# Patient Record
Sex: Female | Born: 1955 | Race: Black or African American | Hispanic: No | Smoking: Current every day smoker
Health system: Southern US, Community
[De-identification: ages and names within clinical notes are randomized; demographics above are authoritative.]

## PROBLEM LIST (undated history)

## (undated) DIAGNOSIS — I10 Essential (primary) hypertension: Secondary | ICD-10-CM

## (undated) DIAGNOSIS — M2550 Pain in unspecified joint: Secondary | ICD-10-CM

## (undated) DIAGNOSIS — R011 Cardiac murmur, unspecified: Secondary | ICD-10-CM

## (undated) DIAGNOSIS — E785 Hyperlipidemia, unspecified: Secondary | ICD-10-CM

## (undated) DIAGNOSIS — R51 Headache: Secondary | ICD-10-CM

## (undated) DIAGNOSIS — R531 Weakness: Secondary | ICD-10-CM

## (undated) DIAGNOSIS — M1711 Unilateral primary osteoarthritis, right knee: Secondary | ICD-10-CM

## (undated) DIAGNOSIS — M549 Dorsalgia, unspecified: Secondary | ICD-10-CM

## (undated) DIAGNOSIS — L678 Other hair color and hair shaft abnormalities: Secondary | ICD-10-CM

## (undated) HISTORY — PX: ABDOMINAL HYSTERECTOMY: SHX81

## (undated) HISTORY — PX: CHOLECYSTECTOMY: SHX55

## (undated) HISTORY — DX: Unilateral primary osteoarthritis, right knee: M17.11

## (undated) HISTORY — PX: DILATION AND CURETTAGE OF UTERUS: SHX78

## (undated) HISTORY — PX: EYE SURGERY: SHX253

## (undated) HISTORY — PX: OTHER SURGICAL HISTORY: SHX169

## (undated) HISTORY — DX: Essential (primary) hypertension: I10

## (undated) HISTORY — DX: Hyperlipidemia, unspecified: E78.5

---

## 1997-09-16 ENCOUNTER — Emergency Department (HOSPITAL_COMMUNITY): Admission: EM | Admit: 1997-09-16 | Discharge: 1997-09-16 | Payer: Self-pay | Admitting: Emergency Medicine

## 1998-04-18 ENCOUNTER — Emergency Department (HOSPITAL_COMMUNITY): Admission: EM | Admit: 1998-04-18 | Discharge: 1998-04-18 | Payer: Self-pay | Admitting: *Deleted

## 1998-04-18 ENCOUNTER — Encounter: Payer: Self-pay | Admitting: Emergency Medicine

## 1998-06-23 ENCOUNTER — Encounter: Admission: RE | Admit: 1998-06-23 | Discharge: 1998-09-21 | Payer: Self-pay | Admitting: Orthopedic Surgery

## 1998-07-02 ENCOUNTER — Encounter (HOSPITAL_BASED_OUTPATIENT_CLINIC_OR_DEPARTMENT_OTHER): Payer: Self-pay | Admitting: General Surgery

## 1998-07-02 ENCOUNTER — Ambulatory Visit (HOSPITAL_COMMUNITY): Admission: RE | Admit: 1998-07-02 | Discharge: 1998-07-03 | Payer: Self-pay | Admitting: General Surgery

## 1999-11-03 ENCOUNTER — Encounter: Payer: Self-pay | Admitting: Family Medicine

## 1999-11-03 ENCOUNTER — Encounter: Admission: RE | Admit: 1999-11-03 | Discharge: 1999-11-03 | Payer: Self-pay | Admitting: Family Medicine

## 2000-09-26 ENCOUNTER — Encounter: Payer: Self-pay | Admitting: Family Medicine

## 2000-09-26 ENCOUNTER — Encounter: Admission: RE | Admit: 2000-09-26 | Discharge: 2000-09-26 | Payer: Self-pay | Admitting: Family Medicine

## 2000-12-11 ENCOUNTER — Other Ambulatory Visit: Admission: RE | Admit: 2000-12-11 | Discharge: 2000-12-11 | Payer: Self-pay | Admitting: Obstetrics

## 2000-12-11 ENCOUNTER — Encounter: Admission: RE | Admit: 2000-12-11 | Discharge: 2000-12-11 | Payer: Self-pay | Admitting: Obstetrics & Gynecology

## 2000-12-18 ENCOUNTER — Encounter: Admission: RE | Admit: 2000-12-18 | Discharge: 2000-12-18 | Payer: Self-pay | Admitting: Obstetrics & Gynecology

## 2000-12-25 ENCOUNTER — Ambulatory Visit (HOSPITAL_COMMUNITY): Admission: RE | Admit: 2000-12-25 | Discharge: 2000-12-25 | Payer: Self-pay | Admitting: *Deleted

## 2001-01-03 ENCOUNTER — Encounter: Admission: RE | Admit: 2001-01-03 | Discharge: 2001-01-03 | Payer: Self-pay | Admitting: Obstetrics

## 2001-01-24 ENCOUNTER — Encounter: Admission: RE | Admit: 2001-01-24 | Discharge: 2001-01-24 | Payer: Self-pay | Admitting: Obstetrics

## 2001-01-26 ENCOUNTER — Inpatient Hospital Stay (HOSPITAL_COMMUNITY): Admission: AD | Admit: 2001-01-26 | Discharge: 2001-01-26 | Payer: Self-pay | Admitting: Obstetrics & Gynecology

## 2001-01-31 ENCOUNTER — Encounter: Admission: RE | Admit: 2001-01-31 | Discharge: 2001-01-31 | Payer: Self-pay | Admitting: Family Medicine

## 2001-01-31 ENCOUNTER — Encounter: Payer: Self-pay | Admitting: Family Medicine

## 2001-02-21 ENCOUNTER — Inpatient Hospital Stay (HOSPITAL_COMMUNITY): Admission: RE | Admit: 2001-02-21 | Discharge: 2001-02-24 | Payer: Self-pay | Admitting: Obstetrics

## 2001-02-21 ENCOUNTER — Encounter (INDEPENDENT_AMBULATORY_CARE_PROVIDER_SITE_OTHER): Payer: Self-pay | Admitting: Specialist

## 2001-06-12 ENCOUNTER — Encounter: Admission: RE | Admit: 2001-06-12 | Discharge: 2001-06-12 | Payer: Self-pay | Admitting: Family Medicine

## 2001-06-12 ENCOUNTER — Encounter: Payer: Self-pay | Admitting: Family Medicine

## 2001-06-14 ENCOUNTER — Encounter: Payer: Self-pay | Admitting: Family Medicine

## 2001-06-14 ENCOUNTER — Encounter: Admission: RE | Admit: 2001-06-14 | Discharge: 2001-06-14 | Payer: Self-pay | Admitting: Family Medicine

## 2001-12-30 ENCOUNTER — Encounter: Admission: RE | Admit: 2001-12-30 | Discharge: 2001-12-30 | Payer: Self-pay | Admitting: Family Medicine

## 2001-12-30 ENCOUNTER — Encounter: Payer: Self-pay | Admitting: Family Medicine

## 2002-07-01 ENCOUNTER — Emergency Department (HOSPITAL_COMMUNITY): Admission: EM | Admit: 2002-07-01 | Discharge: 2002-07-01 | Payer: Self-pay | Admitting: Emergency Medicine

## 2003-03-06 ENCOUNTER — Emergency Department (HOSPITAL_COMMUNITY): Admission: EM | Admit: 2003-03-06 | Discharge: 2003-03-07 | Payer: Self-pay | Admitting: Emergency Medicine

## 2003-05-07 ENCOUNTER — Emergency Department (HOSPITAL_COMMUNITY): Admission: EM | Admit: 2003-05-07 | Discharge: 2003-05-07 | Payer: Self-pay | Admitting: Family Medicine

## 2003-05-26 ENCOUNTER — Emergency Department (HOSPITAL_COMMUNITY): Admission: EM | Admit: 2003-05-26 | Discharge: 2003-05-26 | Payer: Self-pay | Admitting: Family Medicine

## 2003-06-25 ENCOUNTER — Emergency Department (HOSPITAL_COMMUNITY): Admission: EM | Admit: 2003-06-25 | Discharge: 2003-06-25 | Payer: Self-pay | Admitting: Family Medicine

## 2003-10-28 ENCOUNTER — Emergency Department (HOSPITAL_COMMUNITY): Admission: EM | Admit: 2003-10-28 | Discharge: 2003-10-28 | Payer: Self-pay | Admitting: Family Medicine

## 2003-10-30 ENCOUNTER — Emergency Department (HOSPITAL_COMMUNITY): Admission: EM | Admit: 2003-10-30 | Discharge: 2003-10-30 | Payer: Self-pay | Admitting: Family Medicine

## 2003-11-30 ENCOUNTER — Emergency Department (HOSPITAL_COMMUNITY): Admission: EM | Admit: 2003-11-30 | Discharge: 2003-11-30 | Payer: Self-pay | Admitting: Family Medicine

## 2003-12-03 ENCOUNTER — Emergency Department (HOSPITAL_COMMUNITY): Admission: EM | Admit: 2003-12-03 | Discharge: 2003-12-03 | Payer: Self-pay | Admitting: Family Medicine

## 2004-06-06 ENCOUNTER — Emergency Department (HOSPITAL_COMMUNITY): Admission: EM | Admit: 2004-06-06 | Discharge: 2004-06-06 | Payer: Self-pay | Admitting: Family Medicine

## 2004-07-13 ENCOUNTER — Emergency Department (HOSPITAL_COMMUNITY): Admission: EM | Admit: 2004-07-13 | Discharge: 2004-07-13 | Payer: Self-pay | Admitting: Family Medicine

## 2004-07-15 ENCOUNTER — Emergency Department (HOSPITAL_COMMUNITY): Admission: EM | Admit: 2004-07-15 | Discharge: 2004-07-15 | Payer: Self-pay | Admitting: Emergency Medicine

## 2005-01-29 ENCOUNTER — Emergency Department (HOSPITAL_COMMUNITY): Admission: EM | Admit: 2005-01-29 | Discharge: 2005-01-29 | Payer: Self-pay | Admitting: Family Medicine

## 2005-02-28 ENCOUNTER — Encounter: Admission: RE | Admit: 2005-02-28 | Discharge: 2005-02-28 | Payer: Self-pay | Admitting: Family Medicine

## 2005-06-06 ENCOUNTER — Emergency Department (HOSPITAL_COMMUNITY): Admission: EM | Admit: 2005-06-06 | Discharge: 2005-06-06 | Payer: Self-pay | Admitting: Family Medicine

## 2005-10-03 ENCOUNTER — Emergency Department (HOSPITAL_COMMUNITY): Admission: EM | Admit: 2005-10-03 | Discharge: 2005-10-03 | Payer: Self-pay | Admitting: Emergency Medicine

## 2005-10-05 ENCOUNTER — Emergency Department (HOSPITAL_COMMUNITY): Admission: EM | Admit: 2005-10-05 | Discharge: 2005-10-05 | Payer: Self-pay | Admitting: Emergency Medicine

## 2006-11-26 ENCOUNTER — Emergency Department (HOSPITAL_COMMUNITY): Admission: EM | Admit: 2006-11-26 | Discharge: 2006-11-26 | Payer: Self-pay | Admitting: Emergency Medicine

## 2007-01-31 ENCOUNTER — Emergency Department (HOSPITAL_COMMUNITY): Admission: EM | Admit: 2007-01-31 | Discharge: 2007-01-31 | Payer: Self-pay | Admitting: Emergency Medicine

## 2007-10-03 ENCOUNTER — Emergency Department (HOSPITAL_COMMUNITY): Admission: EM | Admit: 2007-10-03 | Discharge: 2007-10-03 | Payer: Self-pay | Admitting: Family Medicine

## 2007-10-30 ENCOUNTER — Emergency Department (HOSPITAL_COMMUNITY): Admission: EM | Admit: 2007-10-30 | Discharge: 2007-10-30 | Payer: Self-pay | Admitting: Family Medicine

## 2007-11-28 ENCOUNTER — Emergency Department (HOSPITAL_COMMUNITY): Admission: EM | Admit: 2007-11-28 | Discharge: 2007-11-28 | Payer: Self-pay | Admitting: Family Medicine

## 2008-07-10 ENCOUNTER — Emergency Department (HOSPITAL_COMMUNITY): Admission: EM | Admit: 2008-07-10 | Discharge: 2008-07-10 | Payer: Self-pay | Admitting: Emergency Medicine

## 2008-09-11 ENCOUNTER — Emergency Department (HOSPITAL_COMMUNITY): Admission: EM | Admit: 2008-09-11 | Discharge: 2008-09-11 | Payer: Self-pay | Admitting: Family Medicine

## 2009-07-27 ENCOUNTER — Emergency Department (HOSPITAL_COMMUNITY): Admission: EM | Admit: 2009-07-27 | Discharge: 2009-07-27 | Payer: Self-pay | Admitting: Emergency Medicine

## 2009-07-29 ENCOUNTER — Emergency Department (HOSPITAL_COMMUNITY): Admission: EM | Admit: 2009-07-29 | Discharge: 2009-07-29 | Payer: Self-pay | Admitting: Family Medicine

## 2009-08-18 ENCOUNTER — Encounter: Admission: RE | Admit: 2009-08-18 | Discharge: 2009-08-18 | Payer: Self-pay | Admitting: Internal Medicine

## 2010-04-03 LAB — CULTURE, ROUTINE-ABSCESS

## 2010-04-25 LAB — URINALYSIS, ROUTINE W REFLEX MICROSCOPIC
Bilirubin Urine: NEGATIVE
Ketones, ur: NEGATIVE mg/dL
Nitrite: NEGATIVE
Protein, ur: NEGATIVE mg/dL
Urobilinogen, UA: 0.2 mg/dL (ref 0.0–1.0)

## 2010-04-25 LAB — POCT I-STAT, CHEM 8
BUN: 19 mg/dL (ref 6–23)
Creatinine, Ser: 0.8 mg/dL (ref 0.4–1.2)
Potassium: 3.7 mEq/L (ref 3.5–5.1)
Sodium: 139 mEq/L (ref 135–145)

## 2010-04-25 LAB — D-DIMER, QUANTITATIVE: D-Dimer, Quant: 0.33 ug/mL-FEU (ref 0.00–0.48)

## 2010-06-03 NOTE — Op Note (Signed)
[facility]  Patient:    Alison Ford, Alison Ford Visit Number: [id number] MRN: [id number]          Service Type: Attending:  Kathreen Cosier, M.D. Dictated by:   Kathreen Cosier, M.D. Proc. Date: 02/21/01                             Operative Report  PREOPERATIVE DIAGNOSIS:       Twenty-week myoma uteri.  POSTOPERATIVE DIAGNOSIS:      Twenty-week myoma uteri.  OPERATION:                    1. Total abdominal hysterectomy.                               2. Bilateral salpingo-oophorectomy.  SURGEON:                      Kathreen Cosier, M.D.  ASSISTANT:                    Bing Neighbors. Clearance Coots, M.D.  ANESTHESIA:                   General.  DESCRIPTION OF PROCEDURE:     Under general anesthesia with the patient in supine position, the abdomen was prepped and draped and the bladder emptied with a Foley catheter. A transverse suprapubic incision was made and carried down to the rectus fascia. The fascia was cleanly incised the length of the incision. The recti muscles were retracted laterally. The peritoneum was incised longitudinally. She had multiple fibroids approximately 20-week size uterus. The right round ligament was grasped with a Kelly clamp, cut and suture ligated with #1 chromic. The procedure done in a similar fashion on the other side. Using Metzenbaum scissors, the bladder flap was developed and the bladder dissected off the surface. The right infundibulopelvic ligament was grasped, cut, and suture ligated with #1 chromic. Procedure done in a similar fashion on the other side. The uterine vessels were skeletonized bilaterally, double clamped with Heaney clamps on the right, cut, suture ligated with #1 chromic. Procedure was done in a similar fashion on the other side. On the right side, straight Kocher clamps were used to grasp the cardinal, uterosacral ligaments. These were cut, suture ligated with #1 chromic. Procedure was done in a  similar fashion on the other side. The specimen consisted of the uterus, tubes, and ovaries were removed at cervicovaginal junction with Mayo scissors. Modified Richardson sutures placed in the angle of the vagina and the vaginal vault run with interlocking sutures of #1 chromic. The vault was left open. The operative site was closed with 2-0 chromic. Blood loss was 100 cc. Lap and sponge counts were correct. The abdomen was closed in layers: the peritoneum with continuous of 0 chromic, the fascia with continuous suture of 0 Dexon, and the skin closed with subcuticular stitch of 3-0 plain. Dictated by:   Kathreen Cosier, M.D. Attending:  Kathreen Cosier, M.D. DD:  02/24/01 TD:  02/25/01 Job: [id number]

## 2010-06-03 NOTE — Discharge Summary (Signed)
[facility]  Patient:    Alison Ford, Alison Ford Visit Number: [id number] MRN: [id number]          Service Type: GYN Location: [id number] Attending Physician:  Venita Sheffield Dictated by:   Kathreen Cosier, M.D. Admit Date:  02/21/2001 Discharge Date: 02/24/2001                             Discharge Summary  HISTORY OF PRESENT ILLNESS:   The patient is a 55 year old female with a 20-week size myoma and a history of dysfunctional uterine bleeding and chronic pelvic pain who was admitted for TAH/BSO.  HOSPITAL COURSE:              On admission her hemoglobin was 10.7, white count was 5.9, platelets 366,000, PT/PTT normal, normal electrolytes, normal CBC. On February 6, the patient underwent TAH/BSO. Postoperatively, she did well. She had one temperature elevation to 101 and was given Cleocin 300 mg p.o. q.6h. She rapidly defervesced and was discharged home on the third postoperative day, ambulatory, on a regular diet.  DISCHARGE MEDICATIONS:        1. Tylox one every three to four hours p.r.n.                               2. Cleocin 300 mg p.o. q.6h. for five days.                               3. Ferrous sulfate 325 mg one p.o. q.d.  DISCHARGE FOLLOWUP:           The patient is to see Dr. Gaynell Face in four weeks.  DISCHARGE DIAGNOSES:          1. Status post total abdominal hysterectomy and                                  bilateral salpingo-oophorectomy for 20-week                                  size myoma uteri.                               2. Anemia. Dictated by:   Kathreen Cosier, M.D. Attending Physician:  Venita Sheffield DD:  02/24/01 TD:  02/24/01 Job: [id number]

## 2011-07-28 ENCOUNTER — Emergency Department (INDEPENDENT_AMBULATORY_CARE_PROVIDER_SITE_OTHER)
Admission: EM | Admit: 2011-07-28 | Discharge: 2011-07-28 | Disposition: A | Discharge status: Home / Self Care | Payer: Self-pay | Source: Home / Self Care | Attending: Emergency Medicine | Admitting: Emergency Medicine

## 2011-07-28 ENCOUNTER — Encounter (HOSPITAL_COMMUNITY): Payer: Self-pay | Admitting: *Deleted

## 2011-07-28 DIAGNOSIS — I10 Essential (primary) hypertension: Secondary | ICD-10-CM

## 2011-07-28 DIAGNOSIS — H1010 Acute atopic conjunctivitis, unspecified eye: Secondary | ICD-10-CM

## 2011-07-28 DIAGNOSIS — H1045 Other chronic allergic conjunctivitis: Secondary | ICD-10-CM

## 2011-07-28 LAB — POCT I-STAT, CHEM 8
Chloride: 106 mEq/L (ref 96–112)
Creatinine, Ser: 0.8 mg/dL (ref 0.50–1.10)
Glucose, Bld: 130 mg/dL — ABNORMAL HIGH (ref 70–99)
HCT: 43 % (ref 36.0–46.0)
Potassium: 3.9 mEq/L (ref 3.5–5.1)
Sodium: 142 mEq/L (ref 135–145)

## 2011-07-28 MED ORDER — CHLORTHALIDONE 25 MG PO TABS: 25.0000 mg | ORAL_TABLET | Freq: Every day | ORAL | Status: DC

## 2011-07-28 NOTE — ED Notes (Signed)
Pt  Advised   The  Importance  Of  Adequate  bp  Management     And  To  followup  Wit  Dr  Bruna Potter  As  Directed

## 2011-07-28 NOTE — ED Provider Notes (Signed)
History     CSN: [id number]  Arrival date & time 07/28/11  4098   First MD Initiated Contact with Patient 07/28/11 1808      Chief Complaint  Patient presents with  . Eye Problem    (Consider location/radiation/quality/duration/timing/severity/associated sxs/prior treatment) The history is provided by the patient.   patient reports right eye discharge followed by left eye discharge, stringy in nature and present for the last three mornings.  States some improvement with compresses but concerned because same symptoms last week that spontaneously resolved and has since returned.  No previous eye surgery/trauma, denies diabetes, cataracts or glaucoma.  No known injuries. No floaters or flashing lights No vision loss No blurred vision No eye pain +eyelid itching No tearing No headache/scalp tenderness   Additionally reports no history of hypertension, but admits to elevated blood pressure for the last year, denies headache or other Neurological or  Cardiac symptoms. History reviewed. No pertinent past medical history.  Past Surgical History  Procedure Date  . Abdominal hysterectomy   . Knee surgery     No family history on file.  History  Substance Use Topics  . Smoking status: Current Everyday Smoker  . Smokeless tobacco: Not on file  . Alcohol Use: No    OB History    Grav Para Term Preterm Abortions TAB SAB Ect Mult Living                  Review of Systems  All other systems reviewed and are negative.    Allergies  Penicillins  Home Medications   Current Outpatient Rx  Name Route Sig Dispense Refill  . CHLORTHALIDONE 25 MG PO TABS Oral Take 1 tablet (25 mg total) by mouth daily. 30 tablet 1    BP 170/104  Pulse 77  Temp 98.5 F (36.9 C) (Oral)  Resp 12  SpO2 99%  Physical Exam  Nursing note and vitals reviewed. Constitutional: She is oriented to person, place, and time. Vital signs are normal. She appears well-developed and well-nourished.  She is active and cooperative.  HENT:  Head: Normocephalic.  Eyes: Conjunctivae are normal. Pupils are equal, round, and reactive to light. No scleral icterus.  Neck: Trachea normal. Neck supple.  Cardiovascular: Normal rate, regular rhythm, normal heart sounds, intact distal pulses and normal pulses.   Pulmonary/Chest: Effort normal and breath sounds normal.  Neurological: She is alert and oriented to person, place, and time. She has normal strength. No cranial nerve deficit or sensory deficit. GCS eye subscore is 4. GCS verbal subscore is 5. GCS motor subscore is 6.  Skin: Skin is warm and dry.  Psychiatric: She has a normal mood and affect. Her speech is normal and behavior is normal. Judgment and thought content normal. Cognition and memory are normal.    ED Course  Procedures (including critical care time)  Labs Reviewed  POCT I-STAT, CHEM 8 - Abnormal; Notable for the following:    Glucose, Bld 130 (*)     Calcium, Ion 1.27 (*)     All other components within normal limits   No results found.   1. Conjunctivitis, allergic   2. Hypertension       MDM  Zyrtec daily and cool compresses for 7 days.  Begin hypertension medication, it is important that you follow up with your PCP for further management and treatment.  RTC if symptoms worsens or change.          Johnsie Kindred, NP 07/28/11 2018

## 2011-07-28 NOTE — ED Notes (Signed)
Pt  Reports  Symptoms  Of  Eye  Irritation     X  4  Days        She  Reports  The  r  Eye  Has  Been draining  Some  As  Well   -  The  Pt  denys  Any  Injury  Or  Any  Known  Causative  Agents  -  The  Pt  Is  Awake  Alert  And  Oriented  And  Pleasant

## 2011-07-29 NOTE — ED Provider Notes (Signed)
Medical screening examination/treatment/procedure(s) were performed by non-physician practitioner and as supervising physician I was immediately available for consultation/collaboration.  Artist Bloom M. MD   Alinna Siple M Teriah Muela, MD 07/29/11 2202 

## 2012-04-03 ENCOUNTER — Ambulatory Visit (HOSPITAL_COMMUNITY)
Admission: RE | Admit: 2012-04-03 | Discharge: 2012-04-03 | Disposition: A | Discharge status: Home / Self Care | Payer: BC Managed Care – PPO | Source: Ambulatory Visit | Attending: Internal Medicine | Admitting: Internal Medicine

## 2012-04-03 ENCOUNTER — Other Ambulatory Visit (HOSPITAL_COMMUNITY): Payer: Self-pay | Admitting: Internal Medicine

## 2012-04-03 DIAGNOSIS — R1013 Epigastric pain: Secondary | ICD-10-CM | POA: Insufficient documentation

## 2012-04-03 DIAGNOSIS — F17200 Nicotine dependence, unspecified, uncomplicated: Secondary | ICD-10-CM | POA: Insufficient documentation

## 2012-04-03 DIAGNOSIS — R059 Cough, unspecified: Secondary | ICD-10-CM | POA: Insufficient documentation

## 2012-06-03 ENCOUNTER — Encounter: Payer: Self-pay | Admitting: Cardiology

## 2012-06-03 ENCOUNTER — Ambulatory Visit (INDEPENDENT_AMBULATORY_CARE_PROVIDER_SITE_OTHER): Payer: BC Managed Care – PPO | Admitting: Cardiology

## 2012-06-03 ENCOUNTER — Encounter: Payer: Self-pay | Admitting: *Deleted

## 2012-06-03 VITALS — BP 161/97 | HR 70 | Ht 67.0 in | Wt 195.1 lb

## 2012-06-03 DIAGNOSIS — R9431 Abnormal electrocardiogram [ECG] [EKG]: Secondary | ICD-10-CM

## 2012-06-03 DIAGNOSIS — I10 Essential (primary) hypertension: Secondary | ICD-10-CM

## 2012-06-03 DIAGNOSIS — R0602 Shortness of breath: Secondary | ICD-10-CM

## 2012-06-03 NOTE — Progress Notes (Signed)
HPI The patient presents for preoperative clearance prior to total knee replacement. She has no prior cardiac history. However, she was recently found to have an abnormal EKG. I was able to review this EKG and there is some evidence of left ventricular hypertrophy. She has had no acute ST segment changes. She has not had any prior cardiac workup. 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 With this amount of lifting carrying and movement she denies any cardiovascular symptoms. In particular she denies any chest pressure, neck or arm discomfort. She denies any palpitations, presyncope or syncope. She has no shortness of breath, PND or orthopnea.  Allergies  Allergen Reactions  . Penicillins     Current Outpatient Prescriptions  Medication Sig Dispense Refill  . cloNIDine (CATAPRES) 0.1 MG tablet Take 0.1 mg by mouth 2 (two) times daily.      Marland Kitchen HYDROcodone-acetaminophen (NORCO/VICODIN) 5-325 MG per tablet Take 1 tablet by mouth every 6 (six) hours as needed for pain.      Marland Kitchen lovastatin (MEVACOR) 20 MG tablet Take 20 mg by mouth at bedtime.       No current facility-administered medications for this visit.    No past medical history on file.  Past Surgical History  Procedure Laterality Date  . Abdominal hysterectomy    . Knee surgery      No family history on file.  History   Social History  . Marital Status: Legally Separated    Spouse Name: N/A    Number of Children: N/A  . Years of Education: N/A   Occupational History  . Not on file.   Social History Main Topics  . Smoking status: Former Smoker    Quit date: 05/04/2012  . Smokeless tobacco: Not on file  . Alcohol Use: No  . Drug Use: Not on file  . Sexually Active: Not on file   Other Topics Concern  . Not on file   Social History Narrative  . No narrative on file    ROS:  Positive for cough, knee pain. Otherwise as stated in the HPI and negative for all other  systems.  PHYSICAL EXAM BP 161/97  Pulse 70  Ht 5\' 7"  (1.702 m)  Wt 195 lb 1.9 oz (88.506 kg)  BMI 30.55 kg/m2 GENERAL:  Well appearing HEENT:  Pupils equal round and reactive, fundi not visualized, oral mucosa unremarkable NECK:  No jugular venous distention, waveform within normal limits, carotid upstroke brisk and symmetric, no bruits, no thyromegaly LYMPHATICS:  No cervical, inguinal adenopathy LUNGS:  Clear to auscultation bilaterally BACK:  No CVA tenderness CHEST:  Unremarkable HEART:  PMI not displaced or sustained,S1 and S2 within normal limits, no S3, no S4, no clicks, no rubs, apical systolic very brief murmur nonradiating, no diastolic murmurs ABD:  Flat, positive bowel sounds normal in frequency in pitch, no bruits, no rebound, no guarding, no midline pulsatile mass, no hepatomegaly, no splenomegaly EXT:  2 plus pulses throughout, no edema, no cyanosis no clubbing SKIN:  No rashes no nodules NEURO:  Cranial nerves II through XII grossly intact, motor grossly intact throughout PSYCH:  Cognitively intact, oriented to person place and time   EKG:  Sinus rhythm, rate 62, axis within normal limits, intervals within normal limits, minimal voltage criteria for left ventricular hypertrophy, no acute ST-T wave changes. 06/03/2012  ASSESSMENT AND PLAN  ABNORMAL EKG:  Her EKG initially demonstrated some minimal voltage criteria for LVH. She  has a very slight systolic murmur. I will check a BNP level. However, she has a high functional level, no high risk findings her symptoms. I would not suggest further cardiovascular testing is indicated at this point.  I will consider echocardiogram if the BNP is elevated.   HTN:  Her blood pressure is not well controlled. She was put on another medication but hasn't been taking this. I had a long discussion with her about the end organ effects of uncontrolled high blood pressure. Her creatinine is mildly elevated. There is some evidence of LVH on  her EKG. She has followup with her primary care physician coming up and I have suggested strongly that she comply with these directions for med titration.  PREOP:  The patient has no high-risk findings. Therefore, based on ACC/AHA guidelines, the patient would be at acceptable risk for the planned procedure without further cardiovascular testing.

## 2012-06-03 NOTE — Patient Instructions (Addendum)
The current medical regimen is effective;  continue present plan and medications.  Please have blood work today (BNP)  You have been cleared for surgery.  Follow up as needed  Hypertension As your heart beats, it forces blood through your arteries. This force is your blood pressure. If the pressure is too high, it is called hypertension (HTN) or high blood pressure. HTN is dangerous because you may have it and not know it. High blood pressure may mean that your heart has to work harder to pump blood. Your arteries may be narrow or stiff. The extra work puts you at risk for heart disease, stroke, and other problems.  Blood pressure consists of two numbers, a higher number over a lower, 110/72, for example. It is stated as "110 over 72." The ideal is below 120 for the top number (systolic) and under 80 for the bottom (diastolic). Write down your blood pressure today. You should pay close attention to your blood pressure if you have certain conditions such as:  Heart failure.  Prior heart attack.  Diabetes  Chronic kidney disease.  Prior stroke.  Multiple risk factors for heart disease. To see if you have HTN, your blood pressure should be measured while you are seated with your arm held at the level of the heart. It should be measured at least twice. A one-time elevated blood pressure reading (especially in the Emergency Department) does not mean that you need treatment. There may be conditions in which the blood pressure is different between your right and left arms. It is important to see your caregiver soon for a recheck. Most people have essential hypertension which means that there is not a specific cause. This type of high blood pressure may be lowered by changing lifestyle factors such as:  Stress.  Smoking.  Lack of exercise.  Excessive weight.  Drug/tobacco/alcohol use.  Eating less salt. Most people do not have symptoms from high blood pressure until it has caused damage  to the body. Effective treatment can often prevent, delay or reduce that damage. TREATMENT  When a cause has been identified, treatment for high blood pressure is directed at the cause. There are a large number of medications to treat HTN. These fall into several categories, and your caregiver will help you select the medicines that are best for you. Medications may have side effects. You should review side effects with your caregiver. If your blood pressure stays high after you have made lifestyle changes or started on medicines,   Your medication(s) may need to be changed.  Other problems may need to be addressed.  Be certain you understand your prescriptions, and know how and when to take your medicine.  Be sure to follow up with your caregiver within the time frame advised (usually within two weeks) to have your blood pressure rechecked and to review your medications.  If you are taking more than one medicine to lower your blood pressure, make sure you know how and at what times they should be taken. Taking two medicines at the same time can result in blood pressure that is too low. SEEK IMMEDIATE MEDICAL CARE IF:  You develop a severe headache, blurred or changing vision, or confusion.  You have unusual weakness or numbness, or a faint feeling.  You have severe chest or abdominal pain, vomiting, or breathing problems. MAKE SURE YOU:   Understand these instructions.  Will watch your condition.  Will get help right away if you are not doing well or get  worse. Document Released: 01/02/2005 Document Revised: 03/27/2011 Document Reviewed: 08/23/2007 [facility] Patient Information 2013 [place], [place].

## 2012-06-05 DIAGNOSIS — R9431 Abnormal electrocardiogram [ECG] [EKG]: Secondary | ICD-10-CM | POA: Insufficient documentation

## 2012-06-05 DIAGNOSIS — I10 Essential (primary) hypertension: Secondary | ICD-10-CM | POA: Insufficient documentation

## 2012-06-21 ENCOUNTER — Other Ambulatory Visit: Payer: Self-pay | Admitting: *Deleted

## 2012-06-21 DIAGNOSIS — R0602 Shortness of breath: Secondary | ICD-10-CM

## 2012-06-24 ENCOUNTER — Telehealth: Payer: Self-pay | Admitting: *Deleted

## 2012-06-24 NOTE — Telephone Encounter (Signed)
Message left for [name] to call and schedule echo.

## 2012-06-25 ENCOUNTER — Telehealth: Payer: Self-pay | Admitting: *Deleted

## 2012-06-25 NOTE — Telephone Encounter (Signed)
Message was left for patient to call to schedule Echocardiogram.

## 2012-06-26 NOTE — Telephone Encounter (Signed)
Echo scheduled for 07/05/12 at 1 pm.

## 2012-07-04 ENCOUNTER — Encounter: Payer: Self-pay | Admitting: Physician Assistant

## 2012-07-04 ENCOUNTER — Other Ambulatory Visit: Payer: Self-pay | Admitting: Physician Assistant

## 2012-07-04 ENCOUNTER — Encounter (HOSPITAL_COMMUNITY): Payer: Self-pay | Admitting: Respiratory Therapy

## 2012-07-04 DIAGNOSIS — M1711 Unilateral primary osteoarthritis, right knee: Secondary | ICD-10-CM | POA: Insufficient documentation

## 2012-07-04 DIAGNOSIS — E785 Hyperlipidemia, unspecified: Secondary | ICD-10-CM

## 2012-07-04 NOTE — H&P (Addendum)
Scheduled Meds: Continuous Infusions: PRN Meds:.  TOTAL KNEE ADMISSION H&P  Patient is being admitted for right total knee arthroplasty.  Subjective:  Chief Complaint:right knee pain.  HPI: Alison Ford, 57 y.o. female, has a history of pain and functional disability in the right knee due to arthritis and has failed non-surgical conservative treatments for greater than 12 weeks to includeNSAID's and/or analgesics, corticosteriod injections, viscosupplementation injections, flexibility and strengthening excercises, supervised PT with diminished ADL's post treatment, use of assistive devices and activity modification.  Onset of symptoms was gradual, starting >10 years ago with gradually worsening course since that time. The patient noted prior procedures on the knee to include  arthroscopy and menisectomy on the right knee(s).  Patient currently rates pain in the right knee(s) at 10 out of 10 with activity. Patient has night pain, worsening of pain with activity and weight bearing, pain that interferes with activities of daily living, crepitus and joint swelling.  Patient has evidence of subchondral sclerosis, periarticular osteophytes and joint space narrowing by imaging studies.  There is no active infection.  Patient Active Problem List   Diagnosis Date Noted  . Right knee DJD   . Hyperlipidemia   . Abnormal EKG 06/05/2012  . HTN (hypertension) 06/05/2012   Past Medical History  Diagnosis Date  . HTN (hypertension)   . Hyperlipidemia   . Right knee DJD     Past Surgical History  Procedure Laterality Date  . Abdominal hysterectomy    . Athroscopic knee surgery      Bilateral     (Not in a hospital admission) Allergies  Allergen Reactions  . Penicillins     Medications: HCTZ 25 mg daily Amlodipine Besylate 10 mg daily Lovastatin 20 mg daily Norco 5/325  1 tab q 6 hrs prn pain   History  Substance Use Topics  . Smoking status: Former Smoker    Quit date: 05/04/2012   . Smokeless tobacco: Not on file  . Alcohol Use: No    Family History  Problem Relation Age of Onset  . CAD Father 62  . CAD Mother 19     Review of Systems  Constitutional: Negative.   HENT: Negative.   Eyes: Negative.   Respiratory: Negative.   Cardiovascular: Negative.   Gastrointestinal: Negative.   Genitourinary: Negative.   Musculoskeletal: Positive for joint pain.  Skin: Negative.   Neurological: Negative.   Endo/Heme/Allergies: Negative.   Psychiatric/Behavioral: Negative.     Objective:  Physical Exam  Constitutional: She is oriented to person, place, and time. She appears well-developed and well-nourished.  HENT:  Head: Normocephalic and atraumatic.  Eyes: Conjunctivae and EOM are normal. Pupils are equal, round, and reactive to light.  Neck: Neck supple.  Cardiovascular: Normal rate and regular rhythm.  Exam reveals no gallop and no friction rub.   Murmur heard. Respiratory: Effort normal and breath sounds normal. No respiratory distress. She has no wheezes. She has no rales.  GI: Soft. Bowel sounds are normal. She exhibits no distension. There is no tenderness. There is no rebound and no guarding.  Genitourinary:  Not pertinent to current symptomatology therefore not examined.  Musculoskeletal:  Examination of her right knee reveals valgus deformity 2+ synovitis 1+ crepitation range of motion -5 to 125 degrees knee is stable with normal patella tracking. Exam of the left knee reveals full range of motion without pain swelling weakness or instability. Vascular exam: pulses 2+ and symmetric.  Neurological: She is alert and oriented to person, place, and  time.  Skin: Skin is warm and dry.  Psychiatric: She has a normal mood and affect. Her behavior is normal.    Vital signs in last 24 hours: Last recorded: 06/19 0900   BP: 129/84 Pulse: 85  Temp: 98.7 F (37.1 C)    Height: 5\' 8"  (1.727 m) SpO2: 96  Weight: 88.905 kg (196 lb)     Labs:   Estimated  body mass index is 29.81 kg/(m^2) as calculated from the following:   Height as of this encounter: 5\' 8"  (1.727 m).   Weight as of this encounter: 88.905 kg (196 lb).   Imaging Review Plain radiographs demonstrate severe degenerative joint disease of the right knee(s). The overall alignment issignificant valgus. The bone quality appears to be good for age and reported activity level.  Assessment/Plan:  End stage arthritis, right knee  Patient Active Problem List   Diagnosis Date Noted  . Right knee DJD   . Hyperlipidemia   . Abnormal EKG 06/05/2012  . HTN (hypertension) 06/05/2012   The patient history, physical examination, clinical judgment of the provider and imaging studies are consistent with end stage degenerative joint disease of the right knee(s) and total knee arthroplasty is deemed medically necessary. The treatment options including medical management, injection therapy arthroscopy and arthroplasty were discussed at length. The risks and benefits of total knee arthroplasty were presented and reviewed. The risks due to aseptic loosening, infection, stiffness, patella tracking problems, thromboembolic complications and other imponderables were discussed. The patient acknowledged the explanation, agreed to proceed with the plan and consent was signed. Patient is being admitted for inpatient treatment for surgery, pain control, PT, OT, prophylactic antibiotics, VTE prophylaxis, progressive ambulation and ADL's and discharge planning. The patient is planning to be discharged home with home health services

## 2012-07-05 ENCOUNTER — Other Ambulatory Visit (HOSPITAL_COMMUNITY): Payer: BC Managed Care – PPO

## 2012-07-08 ENCOUNTER — Telehealth: Payer: Self-pay | Admitting: Cardiology

## 2012-07-08 ENCOUNTER — Encounter (HOSPITAL_COMMUNITY): Payer: Self-pay

## 2012-07-08 ENCOUNTER — Encounter (HOSPITAL_COMMUNITY)
Admission: RE | Admit: 2012-07-08 | Discharge: 2012-07-08 | Disposition: A | Discharge status: Home / Self Care | Payer: BC Managed Care – PPO | Source: Ambulatory Visit | Attending: Orthopedic Surgery | Admitting: Orthopedic Surgery

## 2012-07-08 HISTORY — DX: Weakness: R53.1

## 2012-07-08 HISTORY — DX: Cardiac murmur, unspecified: R01.1

## 2012-07-08 HISTORY — DX: Pain in unspecified joint: M25.50

## 2012-07-08 HISTORY — DX: Dorsalgia, unspecified: M54.9

## 2012-07-08 HISTORY — DX: Other hair color and hair shaft abnormalities: L67.8

## 2012-07-08 HISTORY — DX: Headache: R51

## 2012-07-08 LAB — URINALYSIS, ROUTINE W REFLEX MICROSCOPIC
Bilirubin Urine: NEGATIVE
Hgb urine dipstick: NEGATIVE
Specific Gravity, Urine: 1.02 (ref 1.005–1.030)
pH: 6.5 (ref 5.0–8.0)

## 2012-07-08 LAB — COMPREHENSIVE METABOLIC PANEL
Alkaline Phosphatase: 83 U/L (ref 39–117)
BUN: 14 mg/dL (ref 6–23)
Chloride: 101 mEq/L (ref 96–112)
GFR calc Af Amer: >90 mL/min (ref >90)
GFR calc non Af Amer: >90 mL/min (ref >90)
Glucose, Bld: 118 mg/dL — ABNORMAL HIGH (ref 70–99)
Potassium: 3.8 mEq/L (ref 3.5–5.1)
Total Bilirubin: 0.2 mg/dL — ABNORMAL LOW (ref 0.3–1.2)

## 2012-07-08 LAB — PROTIME-INR: Prothrombin Time: 12.8 seconds (ref 11.6–15.2)

## 2012-07-08 LAB — CBC WITH DIFFERENTIAL/PLATELET
Eosinophils Absolute: 0.2 10*3/uL (ref 0.0–0.7)
Hemoglobin: 14.1 g/dL (ref 12.0–15.0)
Lymphs Abs: 3.2 10*3/uL (ref 0.7–4.0)
MCH: 31.3 pg (ref 26.0–34.0)
Monocytes Relative: 6 % (ref 3–12)
Neutro Abs: 4.2 10*3/uL (ref 1.7–7.7)
Neutrophils Relative %: 52 % (ref 43–77)
RBC: 4.51 MIL/uL (ref 3.87–5.11)

## 2012-07-08 LAB — URINE MICROSCOPIC-ADD ON

## 2012-07-08 MED ORDER — CHLORHEXIDINE GLUCONATE 4 % EX LIQD: 60.0000 mL | Freq: Once | CUTANEOUS | Status: DC

## 2012-07-08 NOTE — Telephone Encounter (Signed)
PT . had  Echo  Appt. 6/20 Call in Cx due to transportation Pt will call back to rsc  later

## 2012-07-08 NOTE — Progress Notes (Signed)
Saw Dr.Hochrein on May 16,14 and to follow up in a yr  Echo report in epic from 06/21/12  Denies ever having a heart cath or stress test  Dr.Hassan is Medical Md  EKG 06-03-12 with report in epic  CXR in epic from 04-03-12

## 2012-07-08 NOTE — Pre-Procedure Instructions (Signed)
Tonee A Lauderback  07/08/2012   Your procedure is scheduled on:  Mon, June 30 @ 9:53 AM  Report to [facility] at 7:45 AM.  Call this number if you have problems the morning of surgery: [phone]   Remember:   Do not eat food or drink liquids after midnight.   Take these medicines the morning of surgery with A SIP OF WATER: Amlodipine(Norvasc and Pain Pill(if needed)   Do not wear jewelry, make-up or nail polish.  Do not wear lotions, powders, or perfumes. You may wear deodorant.  Do not shave 48 hours prior to surgery.   Do not bring valuables to the hospital.  [facility] is not responsible                   for any belongings or valuables.  Contacts, dentures or bridgework may not be worn into surgery.  Leave suitcase in the car. After surgery it may be brought to your room.  For patients admitted to the hospital, checkout time is 11:00 AM the day of  discharge.       Special Instructions: Shower using CHG 2 nights before surgery and the night before surgery.  If you shower the day of surgery use CHG.  Use special wash - you have one bottle of CHG for all showers.  You should use approximately 1/3 of the bottle for each shower.   Please read over the following fact sheets that you were given: Pain Booklet, Coughing and Deep Breathing, Blood Transfusion Information, Total Joint Packet, MRSA Information and Surgical Site Infection Prevention

## 2012-07-09 LAB — URINE CULTURE: Culture: NO GROWTH

## 2012-07-14 MED ORDER — VANCOMYCIN HCL IN DEXTROSE 1-5 GM/200ML-% IV SOLN
1000.0000 mg | INTRAVENOUS | Status: AC
  Administered 2012-07-15: 1000 mg via INTRAVENOUS
  Filled 2012-07-14: qty 200

## 2012-07-14 MED ORDER — TRANEXAMIC ACID 100 MG/ML IV SOLN
1000.0000 mg | INTRAVENOUS | Status: AC
  Administered 2012-07-15: 1000 mg via INTRAVENOUS
  Filled 2012-07-14: qty 10

## 2012-07-15 ENCOUNTER — Encounter (HOSPITAL_COMMUNITY)
Admission: RE | Disposition: A | Discharge status: Home / Self Care | Payer: Self-pay | Source: Ambulatory Visit | Attending: Orthopedic Surgery

## 2012-07-15 ENCOUNTER — Encounter (HOSPITAL_COMMUNITY): Payer: Self-pay | Admitting: *Deleted

## 2012-07-15 ENCOUNTER — Encounter (HOSPITAL_COMMUNITY): Payer: Self-pay | Admitting: Anesthesiology

## 2012-07-15 ENCOUNTER — Ambulatory Visit (HOSPITAL_COMMUNITY): Payer: BC Managed Care – PPO | Admitting: Anesthesiology

## 2012-07-15 ENCOUNTER — Inpatient Hospital Stay (HOSPITAL_COMMUNITY)
Admission: RE | Admit: 2012-07-15 | Discharge: 2012-07-16 | DRG: 209 | Disposition: A | Discharge status: Home / Self Care | Payer: BC Managed Care – PPO | Source: Ambulatory Visit | Attending: Orthopedic Surgery | Admitting: Orthopedic Surgery

## 2012-07-15 DIAGNOSIS — M1710 Unilateral primary osteoarthritis, unspecified knee: Principal | ICD-10-CM | POA: Diagnosis present

## 2012-07-15 DIAGNOSIS — Z7982 Long term (current) use of aspirin: Secondary | ICD-10-CM

## 2012-07-15 DIAGNOSIS — E785 Hyperlipidemia, unspecified: Secondary | ICD-10-CM | POA: Diagnosis present

## 2012-07-15 DIAGNOSIS — R9431 Abnormal electrocardiogram [ECG] [EKG]: Secondary | ICD-10-CM | POA: Diagnosis present

## 2012-07-15 DIAGNOSIS — I10 Essential (primary) hypertension: Secondary | ICD-10-CM | POA: Diagnosis present

## 2012-07-15 DIAGNOSIS — M1711 Unilateral primary osteoarthritis, right knee: Secondary | ICD-10-CM

## 2012-07-15 DIAGNOSIS — Z87891 Personal history of nicotine dependence: Secondary | ICD-10-CM

## 2012-07-15 HISTORY — PX: TOTAL KNEE ARTHROPLASTY: SHX125

## 2012-07-15 SURGERY — ARTHROPLASTY, KNEE, TOTAL
Anesthesia: General | Site: Knee | Laterality: Right | Wound class: Clean

## 2012-07-15 MED ORDER — DEXAMETHASONE SODIUM PHOSPHATE 10 MG/ML IJ SOLN
INTRAMUSCULAR | Status: DC | PRN
  Administered 2012-07-15: 8 mg via INTRAVENOUS

## 2012-07-15 MED ORDER — MIDAZOLAM HCL 2 MG/2ML IJ SOLN
INTRAMUSCULAR | Status: AC
  Administered 2012-07-15: 2 mg via INTRAVENOUS
  Filled 2012-07-15: qty 2

## 2012-07-15 MED ORDER — OXYCODONE HCL 5 MG PO TABS
ORAL_TABLET | ORAL | Status: AC
  Filled 2012-07-15: qty 1

## 2012-07-15 MED ORDER — POTASSIUM CHLORIDE IN NACL 20-0.9 MEQ/L-% IV SOLN
INTRAVENOUS | Status: DC
  Administered 2012-07-15 – 2012-07-16 (×2): via INTRAVENOUS
  Filled 2012-07-15 (×5): qty 1000

## 2012-07-15 MED ORDER — MIDAZOLAM HCL 2 MG/2ML IJ SOLN: 1.0000 mg | INTRAMUSCULAR | Status: DC | PRN

## 2012-07-15 MED ORDER — ZOLPIDEM TARTRATE 5 MG PO TABS: 5.0000 mg | ORAL_TABLET | Freq: Every evening | ORAL | Status: DC | PRN

## 2012-07-15 MED ORDER — SIMVASTATIN 10 MG PO TABS
10.0000 mg | ORAL_TABLET | Freq: Every day | ORAL | Status: DC
  Administered 2012-07-15: 10 mg via ORAL
  Filled 2012-07-15 (×2): qty 1

## 2012-07-15 MED ORDER — DIPHENHYDRAMINE HCL 12.5 MG/5ML PO ELIX: 12.5000 mg | ORAL_SOLUTION | ORAL | Status: DC | PRN

## 2012-07-15 MED ORDER — PHENOL 1.4 % MT LIQD: 1.0000 | OROMUCOSAL | Status: DC | PRN

## 2012-07-15 MED ORDER — OXYCODONE HCL 5 MG PO TABS
5.0000 mg | ORAL_TABLET | Freq: Once | ORAL | Status: AC | PRN
  Administered 2012-07-15: 5 mg via ORAL

## 2012-07-15 MED ORDER — DEXAMETHASONE SODIUM PHOSPHATE 10 MG/ML IJ SOLN
10.0000 mg | Freq: Three times a day (TID) | INTRAMUSCULAR | Status: AC
  Filled 2012-07-15 (×3): qty 1

## 2012-07-15 MED ORDER — DEXAMETHASONE 6 MG PO TABS
10.0000 mg | ORAL_TABLET | Freq: Three times a day (TID) | ORAL | Status: AC
  Administered 2012-07-15 – 2012-07-16 (×3): 10 mg via ORAL
  Filled 2012-07-15 (×3): qty 1

## 2012-07-15 MED ORDER — ONDANSETRON HCL 4 MG/2ML IJ SOLN
INTRAMUSCULAR | Status: DC | PRN
  Administered 2012-07-15: 4 mg via INTRAVENOUS

## 2012-07-15 MED ORDER — DOCUSATE SODIUM 100 MG PO CAPS
100.0000 mg | ORAL_CAPSULE | Freq: Two times a day (BID) | ORAL | Status: DC
  Administered 2012-07-15 – 2012-07-16 (×3): 100 mg via ORAL
  Filled 2012-07-15 (×3): qty 1

## 2012-07-15 MED ORDER — METOCLOPRAMIDE HCL 10 MG PO TABS: 5.0000 mg | ORAL_TABLET | Freq: Three times a day (TID) | ORAL | Status: DC | PRN

## 2012-07-15 MED ORDER — METOCLOPRAMIDE HCL 5 MG/ML IJ SOLN: 5.0000 mg | Freq: Three times a day (TID) | INTRAMUSCULAR | Status: DC | PRN

## 2012-07-15 MED ORDER — SODIUM CHLORIDE 0.9 % IV SOLN
INTRAVENOUS | Status: DC | PRN
  Administered 2012-07-15: 10:00:00 via INTRAVENOUS

## 2012-07-15 MED ORDER — POVIDONE-IODINE 7.5 % EX SOLN: Freq: Once | CUTANEOUS | Status: DC

## 2012-07-15 MED ORDER — ACETAMINOPHEN 650 MG RE SUPP: 650.0000 mg | Freq: Four times a day (QID) | RECTAL | Status: DC | PRN

## 2012-07-15 MED ORDER — LABETALOL HCL 5 MG/ML IV SOLN
INTRAVENOUS | Status: DC | PRN
  Administered 2012-07-15 (×2): 2.5 mg via INTRAVENOUS

## 2012-07-15 MED ORDER — ALUM & MAG HYDROXIDE-SIMETH 200-200-20 MG/5ML PO SUSP: 30.0000 mL | ORAL | Status: DC | PRN

## 2012-07-15 MED ORDER — FENTANYL CITRATE 0.05 MG/ML IJ SOLN: 50.0000 ug | INTRAMUSCULAR | Status: DC | PRN

## 2012-07-15 MED ORDER — METOCLOPRAMIDE HCL 5 MG/ML IJ SOLN: 10.0000 mg | Freq: Once | INTRAMUSCULAR | Status: DC | PRN

## 2012-07-15 MED ORDER — CELECOXIB 200 MG PO CAPS
200.0000 mg | ORAL_CAPSULE | Freq: Two times a day (BID) | ORAL | Status: DC
  Administered 2012-07-15 – 2012-07-16 (×2): 200 mg via ORAL
  Filled 2012-07-15 (×3): qty 1

## 2012-07-15 MED ORDER — AMLODIPINE BESYLATE 10 MG PO TABS
10.0000 mg | ORAL_TABLET | Freq: Every day | ORAL | Status: DC
  Administered 2012-07-16: 10 mg via ORAL
  Filled 2012-07-15: qty 1

## 2012-07-15 MED ORDER — ROPIVACAINE HCL 5 MG/ML IJ SOLN
INTRAMUSCULAR | Status: DC | PRN
  Administered 2012-07-15: 25 mL

## 2012-07-15 MED ORDER — FENTANYL CITRATE 0.05 MG/ML IJ SOLN
INTRAMUSCULAR | Status: AC
  Administered 2012-07-15: 100 ug via INTRAVENOUS
  Filled 2012-07-15: qty 2

## 2012-07-15 MED ORDER — MENTHOL 3 MG MT LOZG: 1.0000 | LOZENGE | OROMUCOSAL | Status: DC | PRN

## 2012-07-15 MED ORDER — BUPIVACAINE-EPINEPHRINE 0.25% -1:200000 IJ SOLN
INTRAMUSCULAR | Status: DC | PRN
  Administered 2012-07-15: 30 mL

## 2012-07-15 MED ORDER — HYDROMORPHONE HCL PF 1 MG/ML IJ SOLN
0.2500 mg | INTRAMUSCULAR | Status: DC | PRN
  Administered 2012-07-15 (×3): 0.5 mg via INTRAVENOUS

## 2012-07-15 MED ORDER — VANCOMYCIN HCL IN DEXTROSE 1-5 GM/200ML-% IV SOLN
1000.0000 mg | Freq: Two times a day (BID) | INTRAVENOUS | Status: AC
  Administered 2012-07-15: 1000 mg via INTRAVENOUS
  Filled 2012-07-15: qty 200

## 2012-07-15 MED ORDER — OXYCODONE HCL 5 MG PO TABS
5.0000 mg | ORAL_TABLET | ORAL | Status: DC | PRN
  Administered 2012-07-15 – 2012-07-16 (×4): 10 mg via ORAL
  Filled 2012-07-15 (×4): qty 2

## 2012-07-15 MED ORDER — BUPIVACAINE HCL (PF) 0.25 % IJ SOLN
INTRAMUSCULAR | Status: AC
  Filled 2012-07-15: qty 30

## 2012-07-15 MED ORDER — BISACODYL 5 MG PO TBEC
10.0000 mg | DELAYED_RELEASE_TABLET | Freq: Every day | ORAL | Status: DC
  Administered 2012-07-15: 10 mg via ORAL
  Filled 2012-07-15: qty 2

## 2012-07-15 MED ORDER — HYDROMORPHONE HCL PF 1 MG/ML IJ SOLN
INTRAMUSCULAR | Status: AC
  Filled 2012-07-15: qty 1

## 2012-07-15 MED ORDER — LACTATED RINGERS IV SOLN
INTRAVENOUS | Status: DC
  Administered 2012-07-15: 09:00:00 via INTRAVENOUS

## 2012-07-15 MED ORDER — PROPOFOL 10 MG/ML IV BOLUS
INTRAVENOUS | Status: DC | PRN
  Administered 2012-07-15: 20 mg via INTRAVENOUS
  Administered 2012-07-15: 200 mg via INTRAVENOUS

## 2012-07-15 MED ORDER — ASPIRIN EC 325 MG PO TBEC
325.0000 mg | DELAYED_RELEASE_TABLET | Freq: Two times a day (BID) | ORAL | Status: DC
  Administered 2012-07-15 – 2012-07-16 (×2): 325 mg via ORAL
  Filled 2012-07-15 (×4): qty 1

## 2012-07-15 MED ORDER — ONDANSETRON HCL 4 MG/2ML IJ SOLN: 4.0000 mg | Freq: Four times a day (QID) | INTRAMUSCULAR | Status: DC | PRN

## 2012-07-15 MED ORDER — OXYCODONE HCL 5 MG/5ML PO SOLN: 5.0000 mg | Freq: Once | ORAL | Status: AC | PRN

## 2012-07-15 MED ORDER — ROCURONIUM BROMIDE 100 MG/10ML IV SOLN
INTRAVENOUS | Status: DC | PRN
  Administered 2012-07-15: 40 mg via INTRAVENOUS

## 2012-07-15 MED ORDER — ONDANSETRON HCL 4 MG PO TABS: 4.0000 mg | ORAL_TABLET | Freq: Four times a day (QID) | ORAL | Status: DC | PRN

## 2012-07-15 MED ORDER — NEOSTIGMINE METHYLSULFATE 1 MG/ML IJ SOLN
INTRAMUSCULAR | Status: DC | PRN
  Administered 2012-07-15: 3 mg via INTRAVENOUS

## 2012-07-15 MED ORDER — LIDOCAINE HCL 1 % IJ SOLN
INTRAMUSCULAR | Status: DC | PRN
  Administered 2012-07-15: 2 mL via INTRADERMAL

## 2012-07-15 MED ORDER — HYDROMORPHONE HCL PF 1 MG/ML IJ SOLN
0.5000 mg | INTRAMUSCULAR | Status: DC | PRN
  Administered 2012-07-15: 1 mg via INTRAVENOUS
  Filled 2012-07-15: qty 1

## 2012-07-15 MED ORDER — FENTANYL CITRATE 0.05 MG/ML IJ SOLN
INTRAMUSCULAR | Status: DC | PRN
  Administered 2012-07-15: 50 ug via INTRAVENOUS
  Administered 2012-07-15 (×2): 100 ug via INTRAVENOUS
  Administered 2012-07-15 (×4): 50 ug via INTRAVENOUS

## 2012-07-15 MED ORDER — ACETAMINOPHEN 325 MG PO TABS
650.0000 mg | ORAL_TABLET | Freq: Four times a day (QID) | ORAL | Status: DC | PRN
  Administered 2012-07-16: 650 mg via ORAL
  Filled 2012-07-15: qty 2

## 2012-07-15 MED ORDER — CLOTRIMAZOLE 1 % EX CREA
TOPICAL_CREAM | Freq: Two times a day (BID) | CUTANEOUS | Status: DC
  Administered 2012-07-15: 17:00:00 via TOPICAL
  Filled 2012-07-15: qty 15

## 2012-07-15 MED ORDER — LIDOCAINE HCL (CARDIAC) 20 MG/ML IV SOLN
INTRAVENOUS | Status: DC | PRN
  Administered 2012-07-15: 40 mg via INTRAVENOUS

## 2012-07-15 MED ORDER — LACTATED RINGERS IV SOLN
INTRAVENOUS | Status: DC | PRN
  Administered 2012-07-15 (×2): via INTRAVENOUS

## 2012-07-15 MED ORDER — GLYCOPYRROLATE 0.2 MG/ML IJ SOLN
INTRAMUSCULAR | Status: DC | PRN
  Administered 2012-07-15: 0.4 mg via INTRAVENOUS

## 2012-07-15 MED ORDER — SODIUM CHLORIDE 0.9 % IR SOLN
Status: DC | PRN
  Administered 2012-07-15: 3000 mL

## 2012-07-15 SURGICAL SUPPLY — 66 items
BANDAGE ESMARK 6X9 LF (GAUZE/BANDAGES/DRESSINGS) ×1 IMPLANT
BLADE SAGITTAL 25.0X1.19X90 (BLADE) ×2 IMPLANT
BLADE SAW SGTL 11.0X1.19X90.0M (BLADE) IMPLANT
BLADE SAW SGTL 13.0X1.19X90.0M (BLADE) ×2 IMPLANT
BLADE SURG 10 STRL SS (BLADE) ×4 IMPLANT
BNDG CMPR 9X6 STRL LF SNTH (GAUZE/BANDAGES/DRESSINGS) ×1
BNDG CMPR MED 15X6 ELC VLCR LF (GAUZE/BANDAGES/DRESSINGS) ×1
BNDG ELASTIC 6X15 VLCR STRL LF (GAUZE/BANDAGES/DRESSINGS) ×2 IMPLANT
BNDG ESMARK 6X9 LF (GAUZE/BANDAGES/DRESSINGS) ×2
BOWL SMART MIX CTS (DISPOSABLE) ×2 IMPLANT
CAPT RP KNEE ×1 IMPLANT
CEMENT HV SMART SET (Cement) ×4 IMPLANT
CLOTH BEACON ORANGE TIMEOUT ST (SAFETY) ×2 IMPLANT
COVER SURGICAL LIGHT HANDLE (MISCELLANEOUS) ×2 IMPLANT
CUFF TOURNIQUET SINGLE 34IN LL (TOURNIQUET CUFF) ×2 IMPLANT
CUFF TOURNIQUET SINGLE 44IN (TOURNIQUET CUFF) IMPLANT
DRAPE EXTREMITY T 121X128X90 (DRAPE) ×2 IMPLANT
DRAPE INCISE IOBAN 66X45 STRL (DRAPES) ×2 IMPLANT
DRAPE PROXIMA HALF (DRAPES) ×2 IMPLANT
DRAPE U-SHAPE 47X51 STRL (DRAPES) ×2 IMPLANT
DRSG ADAPTIC 3X8 NADH LF (GAUZE/BANDAGES/DRESSINGS) ×2 IMPLANT
DRSG PAD ABDOMINAL 8X10 ST (GAUZE/BANDAGES/DRESSINGS) ×4 IMPLANT
DURAPREP 26ML APPLICATOR (WOUND CARE) ×2 IMPLANT
ELECT CAUTERY BLADE 6.4 (BLADE) ×2 IMPLANT
ELECT REM PT RETURN 9FT ADLT (ELECTROSURGICAL) ×2
ELECTRODE REM PT RTRN 9FT ADLT (ELECTROSURGICAL) ×1 IMPLANT
EVACUATOR 1/8 PVC DRAIN (DRAIN) ×1 IMPLANT
FACESHIELD LNG OPTICON STERILE (SAFETY) ×3 IMPLANT
GLOVE BIO SURGEON STRL SZ7 (GLOVE) ×2 IMPLANT
GLOVE BIOGEL PI IND STRL 7.0 (GLOVE) ×1 IMPLANT
GLOVE BIOGEL PI IND STRL 7.5 (GLOVE) ×1 IMPLANT
GLOVE BIOGEL PI INDICATOR 7.0 (GLOVE) ×1
GLOVE BIOGEL PI INDICATOR 7.5 (GLOVE) ×1
GLOVE SS BIOGEL STRL SZ 7.5 (GLOVE) ×1 IMPLANT
GLOVE SUPERSENSE BIOGEL SZ 7.5 (GLOVE) ×1
GOWN PREVENTION PLUS XLARGE (GOWN DISPOSABLE) ×4 IMPLANT
GOWN STRL NON-REIN LRG LVL3 (GOWN DISPOSABLE) ×4 IMPLANT
HANDPIECE INTERPULSE COAX TIP (DISPOSABLE) ×2
HOOD PEEL AWAY FACE SHEILD DIS (HOOD) ×4 IMPLANT
IMMOBILIZER KNEE 22 UNIV (SOFTGOODS) ×1 IMPLANT
KIT BASIN OR (CUSTOM PROCEDURE TRAY) ×2 IMPLANT
KIT ROOM TURNOVER OR (KITS) ×2 IMPLANT
MANIFOLD NEPTUNE II (INSTRUMENTS) ×2 IMPLANT
NS IRRIG 1000ML POUR BTL (IV SOLUTION) ×2 IMPLANT
PACK TOTAL JOINT (CUSTOM PROCEDURE TRAY) ×2 IMPLANT
PAD ARMBOARD 7.5X6 YLW CONV (MISCELLANEOUS) ×4 IMPLANT
PAD CAST 4YDX4 CTTN HI CHSV (CAST SUPPLIES) ×1 IMPLANT
PADDING CAST COTTON 4X4 STRL (CAST SUPPLIES) ×2
PADDING CAST COTTON 6X4 STRL (CAST SUPPLIES) ×2 IMPLANT
POSITIONER HEAD PRONE TRACH (MISCELLANEOUS) ×1 IMPLANT
RUBBERBAND STERILE (MISCELLANEOUS) ×2 IMPLANT
SET HNDPC FAN SPRY TIP SCT (DISPOSABLE) ×1 IMPLANT
SPONGE GAUZE 4X4 12PLY (GAUZE/BANDAGES/DRESSINGS) ×2 IMPLANT
STRIP CLOSURE SKIN 1/2X4 (GAUZE/BANDAGES/DRESSINGS) ×2 IMPLANT
SUCTION FRAZIER TIP 10 FR DISP (SUCTIONS) ×2 IMPLANT
SUT ETHIBOND NAB CT1 #1 30IN (SUTURE) ×4 IMPLANT
SUT MNCRL AB 3-0 PS2 18 (SUTURE) ×2 IMPLANT
SUT VIC AB 0 CT1 27 (SUTURE) ×4
SUT VIC AB 0 CT1 27XBRD ANBCTR (SUTURE) ×2 IMPLANT
SUT VIC AB 2-0 CT1 27 (SUTURE) ×4
SUT VIC AB 2-0 CT1 TAPERPNT 27 (SUTURE) ×2 IMPLANT
SYR 30ML SLIP (SYRINGE) ×2 IMPLANT
TOWEL OR 17X24 6PK STRL BLUE (TOWEL DISPOSABLE) ×2 IMPLANT
TOWEL OR 17X26 10 PK STRL BLUE (TOWEL DISPOSABLE) ×2 IMPLANT
TRAY FOLEY CATH 14FR (SET/KITS/TRAYS/PACK) ×2 IMPLANT
WATER STERILE IRR 1000ML POUR (IV SOLUTION) ×4 IMPLANT

## 2012-07-15 NOTE — Op Note (Signed)
MRN:     [id number] DOB/AGE:    Oct 20, 1955 / 57 y.o.       OPERATIVE REPORT    DATE OF PROCEDURE:  07/15/2012       PREOPERATIVE DIAGNOSIS:   DJD RIGHT KNEE      There is no weight on file to calculate BMI.                                                        POSTOPERATIVE DIAGNOSIS:   degenerative joint disease right knee                                                                      PROCEDURE:  Procedure(s): RIGHT TOTAL KNEE ARTHROPLASTY Using Depuy Sigma RP implants #3 Femur, #4Tibia, 12.31mm sigma RP bearing, 32 Patella     SURGEON: Jeremy Mclamb A    ASSISTANT:  Kirstin Shepperson PA-C   (Present and scrubbed throughout the case, critical for assistance with exposure, retraction, instrumentation, and closure.)         ANESTHESIA: GET with Femoral Nerve Block  DRAINS: foley, 2 medium hemovac in knee   TOURNIQUET TIME:   COMPLICATIONS:  None     SPECIMENS: None   INDICATIONS FOR PROCEDURE: The patient has  DJD RIGHT KNEE, varus deformities, XR shows bone on bone arthritis. Patient has failed all conservative measures including anti-inflammatory medicines, narcotics, attempts at  exercise and weight loss, cortisone injections and viscosupplementation.  Risks and benefits of surgery have been discussed, questions answered.   DESCRIPTION OF PROCEDURE: The patient identified by armband, received  right femoral nerve block and IV antibiotics, in the holding area at [facility]. Patient taken to the operating room, appropriate anesthetic  monitors were attached General endotracheal anesthesia induced with  the patient in supine position, Foley catheter was inserted. Tourniquet  applied high to the operative thigh. Lateral post and foot positioner  applied to the table, the lower extremity was then prepped and draped  in usual sterile fashion from the ankle to the tourniquet. Time-out procedure was performed. The limb was wrapped with an Esmarch bandage and the  tourniquet inflated to 365 mmHg. We began the operation by making the anterior midline incision starting at handbreadth above the patella going over the patella 1 cm medial to and  4 cm distal to the tibial tubercle. Small bleeders in the skin and the  subcutaneous tissue identified and cauterized. Transverse retinaculum was incised and reflected medially and a medial parapatellar arthrotomy was accomplished. the patella was everted and theprepatellar fat pad resected. The superficial medial collateral  ligament was then elevated from anterior to posterior along the proximal  flare of the tibia and anterior half of the menisci resected. The knee was hyperflexed exposing bone on bone arthritis. Peripheral and notch osteophytes as well as the cruciate ligaments were then resected. We continued to  work our way around posteriorly along the proximal tibia, and externally  rotated the tibia subluxing it out from underneath the femur. A McHale  retractor was placed through the notch and a  lateral Hohmann retractor  placed, and we then drilled through the proximal tibia in line with the  axis of the tibia followed by an intramedullary guide rod and 2-degree  posterior slope cutting guide. The tibial cutting guide was pinned into place  allowing resection of 10 mm of bone medially and about 4 mm of bone  laterally because of her valgus deformity. Satisfied with the tibial resection, we then  entered the distal femur 2 mm anterior to the PCL origin with the  intramedullary guide rod and applied the distal femoral cutting guide  set at 11mm, with 5 degrees of valgus. This was pinned along the  epicondylar axis. At this point, the distal femoral cut was accomplished without difficulty. We then sized for a #3 femoral component and pinned the guide in 3 degrees of external rotation.The chamfer cutting guide was pinned into place. The anterior, posterior, and chamfer cuts were accomplished without difficulty  followed by  the Sigma RP box cutting guide and the box cut. We also removed posterior osteophytes from the posterior femoral condyles. At this  time, the knee was brought into full extension. We checked our  extension and flexion gaps and found them symmetric at 12.67mm.  The patella thickness measured at 24 mm. We set the cutting guide at 15 and removed the posterior 9.5-10 mm  of the patella sized for 32 button and drilled the lollipop. The knee  was then once again hyperflexed exposing the proximal tibia. We sized for a #4 tibial base plate, applied the smokestack and the conical reamer followed by the the Delta fin keel punch. We then hammered into place the Sigma RP trial femoral component, inserted a 12.5-mm trial bearing, trial patellar button, and took the knee through range of motion from 0-130 degrees. No thumb pressure was required for patellar  tracking. At this point, all trial components were removed, a double batch of DePuy HV cement  was mixed and applied to all bony metallic mating surfaces except for the posterior condyles of the femur itself. In order, we  hammered into place the tibial tray and removed excess cement, the femoral component and removed excess cement, a 10-mm Sigma RP bearing  was inserted, and the knee brought to full extension with compression.  The patellar button was clamped into place, and excess cement  removed. While the cement cured the wound was irrigated out with normal saline solution pulse lavage, and medium Hemovac drains were placed.. Ligament stability and patellar tracking were checked and found to be excellent. The tourniquet was then released and hemostasis was obtained with cautery. The parapatellar arthrotomy was closed with  #1 ethibond suture. The subcutaneous tissue with 0 and 2-0 undyed  Vicryl suture, and 4-0 Monocryl.. A dressing of Xeroform,  4 x 4, dressing sponges, Webril, and Ace wrap applied. Needle and sponge count were correct times  2.The patient awakened, extubated, and taken to recovery room without difficulty. Vascular status was normal, pulses 2+ and symmetric.   Carlester Kasparek A 07/15/2012, 11:34 AM

## 2012-07-15 NOTE — Plan of Care (Signed)
Problem: Consults Goal: Diagnosis- Total Joint Replacement Primary Total Knee Right     

## 2012-07-15 NOTE — Progress Notes (Signed)
Orthopedic Tech Progress Note Patient Details:  Alison Ford 05-12-1955 [id number] CPM applied to Right LE with appropriate settings. OHF applied to bed.  CPM Right Knee CPM Right Knee: On Right Knee Flexion (Degrees): 60 Right Knee Extension (Degrees): 0   Asia R Thompson 07/15/2012, 12:59 PM

## 2012-07-15 NOTE — Anesthesia Preprocedure Evaluation (Addendum)
Anesthesia Evaluation  Patient identified by MRN, date of birth, ID band Patient awake    Reviewed: Allergy & Precautions, H&P , NPO status , Patient's Chart, lab work & pertinent test results, reviewed documented beta blocker date and time   History of Anesthesia Complications Negative for: history of anesthetic complications  Airway Mallampati: II TM Distance: >3 FB Neck ROM: full    Dental  (+) Teeth Intact and Dental Advisory Given   Pulmonary former smoker (Quit 1 month ago),  breath sounds clear to auscultation        Cardiovascular hypertension, On Medications + Valvular Problems/Murmurs Rhythm:regular     Neuro/Psych  Headaches, negative psych ROS   GI/Hepatic negative GI ROS, Neg liver ROS,   Endo/Other  negative endocrine ROS  Renal/GU negative Renal ROS  negative genitourinary   Musculoskeletal   Abdominal   Peds  Hematology negative hematology ROS (+)   Anesthesia Other Findings See surgeon's H&P   Reproductive/Obstetrics negative OB ROS                          Anesthesia Physical Anesthesia Plan  ASA: II  Anesthesia Plan: General   Post-op Pain Management:    Induction: Intravenous  Airway Management Planned: Oral ETT  Additional Equipment:   Intra-op Plan:   Post-operative Plan: Extubation in OR  Informed Consent: I have reviewed the patients History and Physical, chart, labs and discussed the procedure including the risks, benefits and alternatives for the proposed anesthesia with the patient or authorized representative who has indicated his/her understanding and acceptance.   Dental Advisory Given  Plan Discussed with: CRNA and Surgeon  Anesthesia Plan Comments:         Anesthesia Quick Evaluation

## 2012-07-15 NOTE — H&P (View-Only) (Signed)
Scheduled Meds: Continuous Infusions: PRN Meds:.  TOTAL KNEE ADMISSION H&P  Patient is being admitted for right total knee arthroplasty.  Subjective:  Chief Complaint:right knee pain.  HPI: Alison Ford, 57 y.o. female, has a history of pain and functional disability in the right knee due to arthritis and has failed non-surgical conservative treatments for greater than 12 weeks to includeNSAID's and/or analgesics, corticosteriod injections, viscosupplementation injections, flexibility and strengthening excercises, supervised PT with diminished ADL's post treatment, use of assistive devices and activity modification.  Onset of symptoms was gradual, starting >10 years ago with gradually worsening course since that time. The patient noted prior procedures on the knee to include  arthroscopy and menisectomy on the right knee(s).  Patient currently rates pain in the right knee(s) at 10 out of 10 with activity. Patient has night pain, worsening of pain with activity and weight bearing, pain that interferes with activities of daily living, crepitus and joint swelling.  Patient has evidence of subchondral sclerosis, periarticular osteophytes and joint space narrowing by imaging studies.  There is no active infection.  Patient Active Problem List   Diagnosis Date Noted  . Right knee DJD   . Hyperlipidemia   . Abnormal EKG 06/05/2012  . HTN (hypertension) 06/05/2012   Past Medical History  Diagnosis Date  . HTN (hypertension)   . Hyperlipidemia   . Right knee DJD     Past Surgical History  Procedure Laterality Date  . Abdominal hysterectomy    . Athroscopic knee surgery      Bilateral     (Not in a hospital admission) Allergies  Allergen Reactions  . Penicillins     Medications: HCTZ 25 mg daily Amlodipine Besylate 10 mg daily Lovastatin 20 mg daily Norco 5/325  1 tab q 6 hrs prn pain   History  Substance Use Topics  . Smoking status: Former Smoker    Quit date: 05/04/2012   . Smokeless tobacco: Not on file  . Alcohol Use: No    Family History  Problem Relation Age of Onset  . CAD Father 43  . CAD Mother 81     Review of Systems  Constitutional: Negative.   HENT: Negative.   Eyes: Negative.   Respiratory: Negative.   Cardiovascular: Negative.   Gastrointestinal: Negative.   Genitourinary: Negative.   Musculoskeletal: Positive for joint pain.  Skin: Negative.   Neurological: Negative.   Endo/Heme/Allergies: Negative.   Psychiatric/Behavioral: Negative.     Objective:  Physical Exam  Constitutional: She is oriented to person, place, and time. She appears well-developed and well-nourished.  HENT:  Head: Normocephalic and atraumatic.  Eyes: Conjunctivae and EOM are normal. Pupils are equal, round, and reactive to light.  Neck: Neck supple.  Cardiovascular: Normal rate and regular rhythm.  Exam reveals no gallop and no friction rub.   Murmur heard. Respiratory: Effort normal and breath sounds normal. No respiratory distress. She has no wheezes. She has no rales.  GI: Soft. Bowel sounds are normal. She exhibits no distension. There is no tenderness. There is no rebound and no guarding.  Genitourinary:  Not pertinent to current symptomatology therefore not examined.  Musculoskeletal:  Examination of her right knee reveals valgus deformity 2+ synovitis 1+ crepitation range of motion -5 to 125 degrees knee is stable with normal patella tracking. Exam of the left knee reveals full range of motion without pain swelling weakness or instability. Vascular exam: pulses 2+ and symmetric.  Neurological: She is alert and oriented to person, place, and  time.  Skin: Skin is warm and dry.  Psychiatric: She has a normal mood and affect. Her behavior is normal.    Vital signs in last 24 hours: Last recorded: 06/19 0900   BP: 129/84 Pulse: 85  Temp: 98.7 F (37.1 C)    Height: 5\' 8"  (1.727 m) SpO2: 96  Weight: 88.905 kg (196 lb)     Labs:   Estimated  body mass index is 29.81 kg/(m^2) as calculated from the following:   Height as of this encounter: 5\' 8"  (1.727 m).   Weight as of this encounter: 88.905 kg (196 lb).   Imaging Review Plain radiographs demonstrate severe degenerative joint disease of the right knee(s). The overall alignment issignificant valgus. The bone quality appears to be good for age and reported activity level.  Assessment/Plan:  End stage arthritis, right knee  Patient Active Problem List   Diagnosis Date Noted  . Right knee DJD   . Hyperlipidemia   . Abnormal EKG 06/05/2012  . HTN (hypertension) 06/05/2012   The patient history, physical examination, clinical judgment of the provider and imaging studies are consistent with end stage degenerative joint disease of the right knee(s) and total knee arthroplasty is deemed medically necessary. The treatment options including medical management, injection therapy arthroscopy and arthroplasty were discussed at length. The risks and benefits of total knee arthroplasty were presented and reviewed. The risks due to aseptic loosening, infection, stiffness, patella tracking problems, thromboembolic complications and other imponderables were discussed. The patient acknowledged the explanation, agreed to proceed with the plan and consent was signed. Patient is being admitted for inpatient treatment for surgery, pain control, PT, OT, prophylactic antibiotics, VTE prophylaxis, progressive ambulation and ADL's and discharge planning. The patient is planning to be discharged home with home health services

## 2012-07-15 NOTE — Transfer of Care (Signed)
Immediate Anesthesia Transfer of Care Note  Patient: Alison Ford  Procedure(s) Performed: Procedure(s): RIGHT TOTAL KNEE ARTHROPLASTY (Right)  Patient Location: PACU  Anesthesia Type:General  Level of Consciousness: awake, alert , oriented and patient cooperative  Airway & Oxygen Therapy: Patient Spontanous Breathing and Patient connected to nasal cannula oxygen  Post-op Assessment: Report given to PACU RN and Post -op Vital signs reviewed and stable  Post vital signs: Reviewed and stable  Complications: No apparent anesthesia complications

## 2012-07-15 NOTE — Progress Notes (Signed)
UR COMPLETED  

## 2012-07-15 NOTE — Anesthesia Postprocedure Evaluation (Signed)
Anesthesia Post Note  Patient: Alison Ford  Procedure(s) Performed: Procedure(s) (LRB): RIGHT TOTAL KNEE ARTHROPLASTY (Right)  Anesthesia type: General  Patient location: PACU  Post pain: Pain level controlled and Adequate analgesia  Post assessment: Post-op Vital signs reviewed, Patient's Cardiovascular Status Stable, Respiratory Function Stable, Patent Airway and Pain level controlled  Last Vitals:  Filed Vitals:   07/15/12 1330  BP: 121/89  Pulse: 58  Temp:   Resp: 15    Post vital signs: Reviewed and stable  Level of consciousness: awake, alert  and oriented  Complications: No apparent anesthesia complications

## 2012-07-15 NOTE — Progress Notes (Signed)
Dr. Chaney Malling called for a sign out Dr.Freederick gone

## 2012-07-15 NOTE — Preoperative (Signed)
Beta Blockers   Reason not to administer Beta Blockers:Not Applicable 

## 2012-07-15 NOTE — Interval H&P Note (Signed)
History and Physical Interval Note:  07/15/2012 9:38 AM  Alison Ford  has presented today for surgery, with the diagnosis of DJD RIGHT KNEE  The various methods of treatment have been discussed with the patient and family. After consideration of risks, benefits and other options for treatment, the patient has consented to  Procedure(s): RIGHT TOTAL KNEE ARTHROPLASTY (Right) as a surgical intervention .  The patient's history has been reviewed, patient examined, no change in status, stable for surgery.  I have reviewed the patient's chart and labs.  Questions were answered to the patient's satisfaction.     Salvatore Marvel A

## 2012-07-15 NOTE — Evaluation (Signed)
Physical Therapy Evaluation Patient Details Name: Alison Ford MRN: [id number] DOB: August 25, 1955 Today's Date: 07/15/2012 Time: 6962-9528 PT Time Calculation (min): 15 min  PT Assessment / Plan / Recommendation History of Present Illness  Patient is a 57 yo female s/p Rt. TKA.  Clinical Impression  Patient presents with problems listed below.  Will benefit from acute PT to maximize independence prior to return home.    PT Assessment  Patient needs continued PT services    Follow Up Recommendations  Home health PT;Supervision/Assistance - 24 hour    Does the patient have the potential to tolerate intense rehabilitation      Barriers to Discharge        Equipment Recommendations  Rolling walker with 5" wheels (3-in-1 BSC)    Recommendations for Other Services     Frequency 7X/week    Precautions / Restrictions Precautions Precautions: Knee Precaution Booklet Issued: Yes (comment) Precaution Comments: Provided education on precautions to patient. Required Braces or Orthoses: Knee Immobilizer - Right Knee Immobilizer - Right: On except when in CPM;Discontinue once straight leg raise with < 10 degree lag Restrictions Weight Bearing Restrictions: Yes RLE Weight Bearing: Weight bearing as tolerated   Pertinent Vitals/Pain Pain 10/10 limiting mobility.      Mobility  Bed Mobility Bed Mobility: Supine to Sit;Sitting - Scoot to Edge of Bed;Sit to Supine Supine to Sit: 3: Mod assist;With rails;HOB elevated Sitting - Scoot to Edge of Bed: 4: Min guard;With rail Sit to Supine: 4: Min assist;HOB elevated Details for Bed Mobility Assistance: Verbal cues for technique.  Assist to move RLE off of bed and raise trunk to sitting.  Patient sat at EOB x 8 minutes with good balance.  Assist to bring RLE onto bed when returning to supine. Transfers Transfers: Not assessed (Patient declined due to pain.)    Exercises Total Joint Exercises Ankle Circles/Pumps: AROM;Both;10 reps;Supine    PT Diagnosis: Difficulty walking;Acute pain  PT Problem List: Decreased strength;Decreased range of motion;Decreased activity tolerance;Decreased balance;Decreased mobility;Decreased knowledge of use of DME;Decreased knowledge of precautions;Pain PT Treatment Interventions: DME instruction;Gait training;Functional mobility training;Therapeutic exercise;Patient/family education     PT Goals(Current goals can be found in the care plan section) Acute Rehab PT Goals Patient Stated Goal: To return home PT Goal Formulation: With patient Time For Goal Achievement: 07/22/12 Potential to Achieve Goals: Good  Visit Information  Last PT Received On: 07/15/12 Assistance Needed: +1 History of Present Illness: Patient is a 57 yo female s/p Rt. TKA.       Prior Functioning  Home Living Family/patient expects to be discharged to:: Private residence Living Arrangements: Children Available Help at Discharge: Family;Available 24 hours/day Type of Home: House Home Access: Level entry Home Layout: One level Home Equipment: None Prior Function Level of Independence: Independent Communication Communication: No difficulties    Cognition  Cognition Arousal/Alertness: Awake/alert Behavior During Therapy: WFL for tasks assessed/performed Overall Cognitive Status: Within Functional Limits for tasks assessed    Extremity/Trunk Assessment Upper Extremity Assessment Upper Extremity Assessment: Overall WFL for tasks assessed Lower Extremity Assessment Lower Extremity Assessment: RLE deficits/detail RLE Deficits / Details: Decreased knee ROM; Strength 3-/5 RLE: Unable to fully assess due to pain Cervical / Trunk Assessment Cervical / Trunk Assessment: Normal   Balance Balance Balance Assessed: Yes Static Sitting Balance Static Sitting - Balance Support: No upper extremity supported;Feet supported Static Sitting - Level of Assistance: 5: Stand by assistance Static Sitting - Comment/# of  Minutes: 8  End of Session PT - End  of Session Equipment Utilized During Treatment: Right knee immobilizer;Oxygen Activity Tolerance: Patient limited by pain;Patient limited by fatigue Patient left: in bed;with call bell/phone within reach Nurse Communication: Mobility status CPM Right Knee CPM Right Knee: Off  GP     Vena Austria 07/15/2012, 6:37 PM Durenda Hurt. Renaldo Fiddler, [facility] Acute Rehab Services Pager [phone]

## 2012-07-15 NOTE — Anesthesia Procedure Notes (Addendum)
Anesthesia Regional Block:  Femoral nerve block  Pre-Anesthetic Checklist: ,, timeout performed, Correct Patient, Correct Site, Correct Laterality, Correct Procedure, Correct Position, site marked, Risks and benefits discussed,  Surgical consent,  Pre-op evaluation,  At surgeon's request and post-op pain management  Laterality: Right  Prep: chloraprep       Needles:   Needle Type: Other     Needle Length: 9cm  Needle Gauge: 21    Additional Needles:  Procedures: ultrasound guided (picture in chart) Femoral nerve block Narrative:  Start time: 07/15/2012 9:25 AM End time: 07/15/2012 9:31 AM Injection made incrementally with aspirations every 5 mL.  Performed by: Personally  Anesthesiologist: C. Frederick MD  Additional Notes: Ultrasound guidance used to: id relevant anatomy, confirm needle position, local anesthetic spread, avoidance of vascular puncture. Picture saved. No complications. Block performed personally by Janetta Hora. Gelene Mink, MD    Femoral nerve block Procedure Name: Intubation Date/Time: 07/15/2012 10:14 AM Performed by: Lovie Chol Pre-anesthesia Checklist: Patient identified, Emergency Drugs available, Suction available, Patient being monitored and Timeout performed Patient Re-evaluated:Patient Re-evaluated prior to inductionOxygen Delivery Method: Circle system utilized Preoxygenation: Pre-oxygenation with 100% oxygen Intubation Type: IV induction Ventilation: Mask ventilation without difficulty Laryngoscope Size: Miller and 2 Grade View: Grade I Tube type: Oral Tube size: 7.5 mm Number of attempts: 1 Airway Equipment and Method: Stylet Placement Confirmation: ETT inserted through vocal cords under direct vision,  positive ETCO2,  CO2 detector and breath sounds checked- equal and bilateral Secured at: 21 cm Tube secured with: Tape Dental Injury: Teeth and Oropharynx as per pre-operative assessment

## 2012-07-16 LAB — CBC
MCH: 30.8 pg (ref 26.0–34.0)
MCV: 90.3 fL (ref 78.0–100.0)
Platelets: 249 10*3/uL (ref 150–400)
RBC: 4.12 MIL/uL (ref 3.87–5.11)
RDW: 11.9 % (ref 11.5–15.5)

## 2012-07-16 LAB — BASIC METABOLIC PANEL
CO2: 27 mEq/L (ref 19–32)
Calcium: 9.3 mg/dL (ref 8.4–10.5)
Creatinine, Ser: 0.51 mg/dL (ref 0.50–1.10)

## 2012-07-16 MED ORDER — DSS 100 MG PO CAPS: ORAL_CAPSULE | ORAL | Status: DC

## 2012-07-16 MED ORDER — BISACODYL 5 MG PO TBEC: DELAYED_RELEASE_TABLET | ORAL | Status: DC

## 2012-07-16 MED ORDER — ACETAMINOPHEN 325 MG PO TABS: 650.0000 mg | ORAL_TABLET | Freq: Four times a day (QID) | ORAL | Status: DC | PRN

## 2012-07-16 MED ORDER — METHOCARBAMOL 500 MG PO TABS: ORAL_TABLET | ORAL | Status: DC

## 2012-07-16 MED ORDER — ASPIRIN 325 MG PO TBEC: DELAYED_RELEASE_TABLET | ORAL | Status: DC

## 2012-07-16 MED ORDER — CELECOXIB 200 MG PO CAPS: ORAL_CAPSULE | ORAL | Status: DC

## 2012-07-16 MED ORDER — OXYCODONE HCL 5 MG PO TABS: ORAL_TABLET | ORAL | Status: DC

## 2012-07-16 MED ORDER — METHOCARBAMOL 500 MG PO TABS
500.0000 mg | ORAL_TABLET | Freq: Three times a day (TID) | ORAL | Status: DC | PRN
  Filled 2012-07-16: qty 1

## 2012-07-16 NOTE — Discharge Summary (Signed)
Patient ID: TYLENE QUASHIE MRN: [id number] DOB/AGE: 1955-02-26 57 y.o.  Admit date: 07/15/2012 Discharge date: 07/16/2012  Admission Diagnoses:  Principal Problem:   Right knee DJD Active Problems:   Abnormal EKG   HTN (hypertension)   Hyperlipidemia   Discharge Diagnoses:  Same  Past Medical History  Diagnosis Date  . Hyperlipidemia     takes Lovastatin nightly  . Right knee DJD   . HTN (hypertension)     takes Amlodipine daily and HCTZ   . Heart murmur     slight  . Headache(784.0)   . Weakness     left hand  . Joint pain   . Back pain     d/t knee pain  . Abnormal facial hair     Surgeries: Procedure(s): RIGHT TOTAL KNEE ARTHROPLASTY on 07/15/2012   Consultants:    Discharged Condition: Improved  Hospital Course: Alison Ford is an 57 y.o. female who was admitted 07/15/2012 for operative treatment ofRight knee DJD. Patient has severe unremitting pain that affects sleep, daily activities, and work/hobbies. After pre-op clearance the patient was taken to the operating room on 07/15/2012 and underwent  Procedure(s): RIGHT TOTAL KNEE ARTHROPLASTY.    Patient was given perioperative antibiotics: Anti-infectives   Start     Dose/Rate Route Frequency Ordered Stop   07/15/12 2200  vancomycin (VANCOCIN) IVPB 1000 mg/200 mL premix     1,000 mg 200 mL/hr over 60 Minutes Intravenous Every 12 hours 07/15/12 1508 07/15/12 2323   07/15/12 0600  vancomycin (VANCOCIN) IVPB 1000 mg/200 mL premix     1,000 mg 200 mL/hr over 60 Minutes Intravenous On call to O.R. 07/14/12 1515 07/15/12 1005       Patient was given sequential compression devices, early ambulation, and chemoprophylaxis to prevent DVT.  Patient benefited maximally from hospital stay and there were no complications.    Recent vital signs: Patient Vitals for the past 24 hrs:  BP Temp Pulse Resp SpO2  07/16/12 0556 139/81 mmHg 98.6 F (37 C) 64 18 99 %  07/16/12 0435 - - - 18 87 %  07/16/12 0200 138/70  mmHg 98.4 F (36.9 C) 57 18 98 %  07/16/12 0000 - - - 18 95 %  07/15/12 2118 145/73 mmHg 98.6 F (37 C) 67 18 99 %  07/15/12 1420 132/60 mmHg 98.8 F (37.1 C) 63 16 -  07/15/12 1356 130/66 mmHg - 65 23 98 %  07/15/12 1345 133/78 mmHg 97.8 F (36.6 C) 62 11 96 %  07/15/12 1342 - - 64 10 88 %  07/15/12 1341 138/74 mmHg - 62 - 94 %  07/15/12 1330 121/89 mmHg - 58 15 95 %  07/15/12 1326 121/89 mmHg - 67 21 92 %  07/15/12 1315 136/68 mmHg - 57 14 95 %  07/15/12 1310 136/68 mmHg - 53 13 98 %  07/15/12 1300 132/63 mmHg - 57 12 97 %  07/15/12 1256 132/63 mmHg - 57 13 100 %  07/15/12 1245 - - 55 10 94 %  07/15/12 1241 142/81 mmHg - 53 12 100 %  07/15/12 1234 161/89 mmHg - 59 19 98 %  07/15/12 1230 - - 53 10 99 %  07/15/12 1218 158/83 mmHg - 58 10 98 %  07/15/12 1215 - - 65 27 99 %  07/15/12 1210 - 97.6 F (36.4 C) - - -  07/15/12 0940 105/75 mmHg - 59 14 98 %  07/15/12 0935 106/71 mmHg - 62 13 97 %  07/15/12 0930  104/71 mmHg - 69 16 97 %  07/15/12 0926 115/71 mmHg - 55 19 100 %  07/15/12 0923 - - 58 15 100 %  07/15/12 0920 124/80 mmHg - 58 15 95 %     Recent laboratory studies:  Recent Labs  07/16/12 0445  WBC 13.0*  HGB 12.7  HCT 37.2  PLT 249  NA 139  K 4.3  CL 105  CO2 27  BUN 10  CREATININE 0.51  GLUCOSE 159*  CALCIUM 9.3     Discharge Medications:     Medication List    STOP taking these medications       HYDROcodone-acetaminophen 5-325 MG per tablet  Commonly known as:  NORCO/VICODIN      TAKE these medications       acetaminophen 325 MG tablet  Commonly known as:  TYLENOL  Take 2 tablets (650 mg total) by mouth every 6 (six) hours as needed.     amLODipine 10 MG tablet  Commonly known as:  NORVASC  Take 10 mg by mouth daily.     aspirin 325 MG EC tablet  1 tablet 2 times a day to prevent blood clots     bisacodyl 5 MG EC tablet  Commonly known as:  DULCOLAX  Take 2 tablets every night with dinner until bowel movement.  LAXITIVE.  Restart if  two days since last bowel movement     celecoxib 200 MG capsule  Commonly known as:  CELEBREX  1 tablet daily with food for pain and swelling     clotrimazole 1 % cream  Commonly known as:  LOTRIMIN  Apply topically 2 (two) times daily.     DSS 100 MG Caps  1 tab 2 times a day while on narcotics.  STOOL SOFTENER     hydrochlorothiazide 25 MG tablet  Commonly known as:  HYDRODIURIL  Take 25 mg by mouth daily.     lovastatin 20 MG tablet  Commonly known as:  MEVACOR  Take 20 mg by mouth at bedtime.     methocarbamol 500 MG tablet  Commonly known as:  ROBAXIN  1 tablet every 8 hrs as needed for muscle spasm     oxyCODONE 5 MG immediate release tablet  Commonly known as:  Oxy IR/ROXICODONE  1-2 tablets every 4-6 hrs as needed for pain        Diagnostic Studies: No results found.  Disposition: 01-Home or Self Care      Discharge Orders   Future Orders Complete By Expires     CPM  As directed     Comments:      Continuous passive motion machine (CPM):      Use the CPM from 0 to 90 for 6 hours per day.       You may break it up into 2 or 3 sessions per day.      Use CPM for 2 weeks or until you are told to stop.    Call MD / Call 911  As directed     Comments:      If you experience chest pain or shortness of breath, CALL 911 and be transported to the hospital emergency room.  If you develope a fever above 101 F, pus (white drainage) or increased drainage or redness at the wound, or calf pain, call your surgeon's office.    Change dressing  As directed     Comments:      Change the dressing daily with sterile 4 x  4 inch gauze dressing and apply TED hose.  You may clean the incision with alcohol prior to redressing.    Constipation Prevention  As directed     Comments:      Drink plenty of fluids.  Prune juice may be helpful.  You may use a stool softener, such as Colace (over the counter) 100 mg twice a day.  Use MiraLax (over the counter) for constipation as needed.     Diet - low sodium heart healthy  As directed     Discharge instructions  As directed     Comments:      Total Knee Replacement Care After Refer to this sheet in the next few weeks. These discharge instructions provide you with general information on caring for yourself after you leave the hospital. Your caregiver may also give you specific instructions. Your treatment has been planned according to the most current medical practices available, but unavoidable complications sometimes occur. If you have any problems or questions after discharge, please call your caregiver. Regaining a near full range of motion of your knee within the first 3 to 6 weeks after surgery is critical. HOME CARE INSTRUCTIONS  You may resume a normal diet and activities as directed.  Perform exercises as directed.  Place yellow foam block, yellow side up under heel at all times except when in CPM or when walking.  DO NOT modify, tear, cut, or change in any way. You will receive physical therapy daily  Take showers instead of baths until informed otherwise.  Change bandages (dressings)daily Do not take over-the-counter or prescription medicines for pain, discomfort, or fever. Eat a well-balanced diet.  Avoid lifting or driving until you are instructed otherwise.  Make an appointment to see your caregiver for stitches (suture) or staple removal as directed.  If you have been sent home with a continuous passive motion machine (CPM machine), 0-90 degrees 6 hrs a day   2 hrs a shift SEEK MEDICAL CARE IF: You have swelling of your calf or leg.  You develop shortness of breath or chest pain.  You have redness, swelling, or increasing pain in the wound.  There is pus or any unusual drainage coming from the surgical site.  You notice a bad smell coming from the surgical site or dressing.  The surgical site breaks open after sutures or staples have been removed.  There is persistent bleeding from the suture or staple line.   You are getting worse or are not improving.  You have any other questions or concerns.  SEEK IMMEDIATE MEDICAL CARE IF:  You have a fever.  You develop a rash.  You have difficulty breathing.  You develop any reaction or side effects to medicines given.  Your knee motion is decreasing rather than improving.  MAKE SURE YOU:  Understand these instructions.  Will watch your condition.  Will get help right away if you are not doing well or get worse.    Do not put a pillow under the knee. Place it under the heel.  As directed     Comments:      Place yellow foam block, yellow side up under heel at all times except when in CPM or when walking.  DO NOT modify, tear, cut, or change in any way the yellow foam block.    Increase activity slowly as tolerated  As directed     TED hose  As directed     Comments:      Use stockings (  TED hose) for 2 weeks on both leg(s).  You may remove them at night for sleeping.       Follow-up Information   Follow up with Nilda Simmer, MD On 07/29/2012. (appt time 2:45pm)    Contact information:   [address] [place] [address] [phone]        Signed: Pascal Lux 07/16/2012, 8:32 AM

## 2012-07-16 NOTE — Care Management Note (Signed)
    Page 1 of 2   07/16/2012     10:37:02 AM   CARE MANAGEMENT NOTE 07/16/2012  Patient:  KERRILYNN, DERENZO A   Account Number:  [id number]  Date Initiated:  07/16/2012  Documentation initiated by:  [facility]  Subjective/Objective Assessment:   admitted postop rt total knee arthroplasty     Action/Plan:   plan return home with HHPT   Anticipated DC Date:  07/16/2012   Anticipated DC Plan:  HOME W HOME HEALTH SERVICES      DC Planning Services  CM consult      Choice offered to / List presented to:     DME arranged  3-N-1  WALKER - ROLLING  CPM      DME agency  [employer]     HH arranged  HH-2 PT      [facility] agency  [facility]   Status of service:  Completed, signed off Medicare Important Message given?   (If response is "NO", the following Medicare IM given date fields will be blank) Date Medicare IM given:   Date Additional Medicare IM given:    Discharge Disposition:  HOME W HOME HEALTH SERVICES  Per UR Regulation:    If discussed at Long Length of Stay Meetings, dates discussed:    Comments:  07/16/12 HHPT set up wit Gordy Clement by MD office. Spoke with patient, no change in d/c plan. Jacquelynn Cree RN, BSN, CCM

## 2012-07-16 NOTE — Progress Notes (Signed)
Physical Therapy Treatment Patient Details Name: Alison Ford MRN: [id number] DOB: [birth year] Today's Date: 07/16/2012 Time: 3664-4034 PT Time Calculation (min): 31 min  PT Assessment / Plan / Recommendation  PT Comments   Making excellent progress; Able to perform step-through gait pattern; on track for dc home later today  Follow Up Recommendations  Home health PT;Supervision/Assistance - 24 hour     Does the patient have the potential to tolerate intense rehabilitation     Barriers to Discharge        Equipment Recommendations  Rolling walker with 5" wheels    Recommendations for Other Services    Frequency 7X/week   Progress towards PT Goals Progress towards PT goals: Progressing toward goals  Plan Current plan remains appropriate    Precautions / Restrictions Precautions Precautions: Knee Precaution Comments: Provided education on precautions to patient. Required Braces or Orthoses: Knee Immobilizer - Right Knee Immobilizer - Right: On except when in CPM;Discontinue once straight leg raise with < 10 degree lag Restrictions RLE Weight Bearing: Weight bearing as tolerated   Pertinent Vitals/Pain 7/10 R knee patient repositioned for comfort     Mobility  Bed Mobility Bed Mobility: Supine to Sit;Sitting - Scoot to [place] of Bed;Sit to Supine Supine to Sit: 4: Min guard;HOB flat Sitting - Scoot to [place] of Bed: 4: Min guard Sit to Supine: 4: Min guard;HOB flat Details for Bed Mobility Assistance: Cues for technique Transfers Transfers: Sit to Stand;Stand to Sit Sit to Stand: 4: Min guard Stand to Sit: 4: Min guard Details for Transfer Assistance: Cues for technique, hand position, safety Ambulation/Gait Ambulation/Gait Assistance: 4: Min guard Ambulation Distance (Feet): 80 Feet Assistive device: Rolling walker Ambulation/Gait Assistance Details: Cues for gait sequence, and to activate R quad for stance stability Gait Pattern: Step-through pattern    Exercises  Total Joint Exercises Quad Sets: AROM;Right;10 reps   PT Diagnosis:    PT Problem List:   PT Treatment Interventions:     PT Goals (current goals can now be found in the care plan section) Acute Rehab PT Goals Patient Stated Goal: To return home Time For Goal Achievement: 07/22/12 Potential to Achieve Goals: Good  Visit Information  Last PT Received On: 07/16/12 Assistance Needed: +1 History of Present Illness: Patient is a 57 yo female s/p Rt. TKA.    Subjective Data  Subjective: Seemed happy with her performance Patient Stated Goal: To return home   Cognition  Cognition Arousal/Alertness: Awake/alert Behavior During Therapy: WFL for tasks assessed/performed Overall Cognitive Status: Within Functional Limits for tasks assessed    Balance     End of Session PT - End of Session Activity Tolerance: Patient tolerated treatment well Patient left: in bed;in CPM;with call bell/phone within reach Nurse Communication: Mobility status   GP     Van Clines [facility] [place], [place] [phone]  07/16/2012, 12:15 PM

## 2012-07-17 ENCOUNTER — Encounter (HOSPITAL_COMMUNITY): Payer: Self-pay | Admitting: Orthopedic Surgery

## 2013-12-25 ENCOUNTER — Emergency Department (HOSPITAL_COMMUNITY)
Admission: EM | Admit: 2013-12-25 | Discharge: 2013-12-25 | Discharge status: Against Medical Advice | Payer: BC Managed Care – PPO | Attending: Emergency Medicine | Admitting: Emergency Medicine

## 2013-12-25 ENCOUNTER — Encounter (HOSPITAL_COMMUNITY): Payer: Self-pay | Admitting: Cardiology

## 2013-12-25 DIAGNOSIS — I10 Essential (primary) hypertension: Secondary | ICD-10-CM | POA: Diagnosis not present

## 2013-12-25 DIAGNOSIS — M79601 Pain in right arm: Secondary | ICD-10-CM | POA: Insufficient documentation

## 2013-12-25 DIAGNOSIS — M25561 Pain in right knee: Secondary | ICD-10-CM | POA: Insufficient documentation

## 2013-12-25 NOTE — ED Notes (Signed)
Pt reports she has been having right arm pain and right knee pain for a couple of weeks. Reports she has been to the PCP and had x-rays done of the arm and surgery last year to the right knee.

## 2014-01-05 ENCOUNTER — Encounter (HOSPITAL_COMMUNITY): Payer: Self-pay | Admitting: Emergency Medicine

## 2014-01-05 ENCOUNTER — Emergency Department (HOSPITAL_COMMUNITY): Payer: BC Managed Care – PPO

## 2014-01-05 ENCOUNTER — Emergency Department (HOSPITAL_COMMUNITY)
Admission: EM | Admit: 2014-01-05 | Discharge: 2014-01-05 | Disposition: A | Discharge status: Home / Self Care | Payer: Self-pay | Attending: Emergency Medicine | Admitting: Emergency Medicine

## 2014-01-05 DIAGNOSIS — Z79899 Other long term (current) drug therapy: Secondary | ICD-10-CM | POA: Insufficient documentation

## 2014-01-05 DIAGNOSIS — I10 Essential (primary) hypertension: Secondary | ICD-10-CM | POA: Insufficient documentation

## 2014-01-05 DIAGNOSIS — Z872 Personal history of diseases of the skin and subcutaneous tissue: Secondary | ICD-10-CM | POA: Insufficient documentation

## 2014-01-05 DIAGNOSIS — Z880 Allergy status to penicillin: Secondary | ICD-10-CM | POA: Insufficient documentation

## 2014-01-05 DIAGNOSIS — R011 Cardiac murmur, unspecified: Secondary | ICD-10-CM | POA: Insufficient documentation

## 2014-01-05 DIAGNOSIS — E785 Hyperlipidemia, unspecified: Secondary | ICD-10-CM | POA: Insufficient documentation

## 2014-01-05 DIAGNOSIS — M1711 Unilateral primary osteoarthritis, right knee: Secondary | ICD-10-CM | POA: Insufficient documentation

## 2014-01-05 DIAGNOSIS — Z87891 Personal history of nicotine dependence: Secondary | ICD-10-CM | POA: Insufficient documentation

## 2014-01-05 DIAGNOSIS — M25569 Pain in unspecified knee: Secondary | ICD-10-CM

## 2014-01-05 DIAGNOSIS — M25511 Pain in right shoulder: Secondary | ICD-10-CM | POA: Insufficient documentation

## 2014-01-05 DIAGNOSIS — M25561 Pain in right knee: Secondary | ICD-10-CM | POA: Insufficient documentation

## 2014-01-05 DIAGNOSIS — Z7982 Long term (current) use of aspirin: Secondary | ICD-10-CM | POA: Insufficient documentation

## 2014-01-05 MED ORDER — HYDROCODONE-ACETAMINOPHEN 5-325 MG PO TABS: 1.0000 | ORAL_TABLET | Freq: Four times a day (QID) | ORAL | Status: DC | PRN

## 2014-01-05 MED ORDER — OXYCODONE-ACETAMINOPHEN 5-325 MG PO TABS
1.0000 | ORAL_TABLET | Freq: Once | ORAL | Status: AC
  Administered 2014-01-05: 1 via ORAL
  Filled 2014-01-05: qty 1

## 2014-01-05 MED ORDER — NAPROXEN 500 MG PO TABS: 500.0000 mg | ORAL_TABLET | Freq: Two times a day (BID) | ORAL | Status: DC

## 2014-01-05 NOTE — ED Provider Notes (Signed)
CSN: [id number]     Arrival date & time 01/05/14  0620 History   First MD Initiated Contact with Patient 01/05/14 [phone]     Chief Complaint  Patient presents with  . Shoulder Pain  . Knee Pain     (Consider location/radiation/quality/duration/timing/severity/associated sxs/prior Treatment) HPI Alison Ford is a 58 y.o. female history of hypertension, osteoarthritis, right knee replacement 1 year ago, presents to emergency department complaining of right knee pain and right shoulder pain. Both started 1 month ago. She denies any injuries at that time. She denies any episodes still would've caused the increased pain. She states she tried to go see her orthopedics doctor, but reports losing insurance and her job several months ago, and was unable to pay the co-pay. At this time she does not have a primary care doctor. She has been taking Tylenol and staying off of her leg and not using her arm, but states is not helping. She reports pain is worsening. Wertz pain is worse with movement of the right shoulder and walking of the right knee. Denies any numbness or weakness to extremities. Denies any fever or chills.  Past Medical History  Diagnosis Date  . Hyperlipidemia     takes Lovastatin nightly  . Right knee DJD   . HTN (hypertension)     takes Amlodipine daily and HCTZ   . Heart murmur     slight  . Headache(784.0)   . Weakness     left hand  . Joint pain   . Back pain     d/t knee pain  . Abnormal facial hair    Past Surgical History  Procedure Laterality Date  . Abdominal hysterectomy    . Athroscopic knee surgery      Bilateral  . Cholecystectomy    . Dilation and curettage of uterus    . Total knee arthroplasty Right 07/15/2012    Procedure: RIGHT TOTAL KNEE ARTHROPLASTY;  Surgeon: Lorn Junes, MD;  Location: [facility];  Service: Orthopedics;  Laterality: Right;   Family History  Problem Relation Age of Onset  . CAD Father 76  . CAD Mother 62   History  Substance  Use Topics  . Smoking status: Former Research scientist (life sciences)  . Smokeless tobacco: Not on file     Comment: quit on May 19,2014  . Alcohol Use: No   OB History    No data available     Review of Systems  Constitutional: Negative for fever and chills.  Respiratory: Negative for cough, chest tightness and shortness of breath.   Cardiovascular: Negative for chest pain, palpitations and leg swelling.  Musculoskeletal: Positive for joint swelling and arthralgias. Negative for neck pain and neck stiffness.  Skin: Negative for rash.  Neurological: Negative for dizziness, weakness, numbness and headaches.  All other systems reviewed and are negative.     Allergies  Penicillins  Home Medications   Prior to Admission medications   Medication Sig Start Date End Date Taking? Authorizing Provider  acetaminophen (TYLENOL) 325 MG tablet Take 2 tablets (650 mg total) by mouth every 6 (six) hours as needed. 07/16/12   Kirstin J Shepperson, PA-C  amLODipine (NORVASC) 10 MG tablet Take 10 mg by mouth daily.    Historical Provider, MD  aspirin EC 325 MG EC tablet 1 tablet 2 times a day to prevent blood clots 07/16/12   Kirstin J Shepperson, PA-C  bisacodyl (DULCOLAX) 5 MG EC tablet Take 2 tablets every night with dinner until bowel movement.  LAXITIVE.  Restart if two days since last bowel movement 07/16/12   Kirstin J Shepperson, PA-C  celecoxib (CELEBREX) 200 MG capsule 1 tablet daily with food for pain and swelling 07/16/12   Kirstin J Shepperson, PA-C  clotrimazole (LOTRIMIN) 1 % cream Apply topically 2 (two) times daily.    Historical Provider, MD  docusate sodium 100 MG CAPS 1 tab 2 times a day while on narcotics.  STOOL SOFTENER 07/16/12   Kirstin J Shepperson, PA-C  hydrochlorothiazide (HYDRODIURIL) 25 MG tablet Take 25 mg by mouth daily.    Historical Provider, MD  lovastatin (MEVACOR) 20 MG tablet Take 20 mg by mouth at bedtime.    Historical Provider, MD  methocarbamol (ROBAXIN) 500 MG tablet 1 tablet every 8 hrs  as needed for muscle spasm 07/16/12   Kirstin J Shepperson, PA-C  oxyCODONE (OXY IR/ROXICODONE) 5 MG immediate release tablet 1-2 tablets every 4-6 hrs as needed for pain 07/16/12   Kirstin J Shepperson, PA-C   BP 164/105 mmHg  Pulse 75  Temp(Src) 98.2 F (36.8 C) (Oral)  Resp 18  Ht 5' 7.5" (1.715 m)  Wt 185 lb (83.915 kg)  BMI 28.53 kg/m2  SpO2 96% Physical Exam  Constitutional: She appears well-developed and well-nourished.  Patient is tearful  Eyes: Conjunctivae are normal.  Neck: Neck supple.  Musculoskeletal:  Mild swelling noted to the right knee, healed large vertical incision. Tender to palpation over anterior knee. Full range of motion, pain with full flexion and full extension. Patella tendon intact. Negative anterior-posterior drawer signs. DP pulses are intact and equal bilaterally. Tender to palpation over anterior and posterior right shoulder. Pain with active and passive range of motion of the shoulder in all directions. Patient unable to actively raise her arm greater than 90. Positive arm drop test. Pain with external and internal rotation. Joint appears to be normal, with no obvious swelling, erythema, warm to touch. Distal radial pulses intact.  Neurological: She is alert.  Skin: Skin is warm and dry.  Nursing note and vitals reviewed.   ED Course  Procedures (including critical care time) Labs Review Labs Reviewed - No data to display  Imaging Review Dg Shoulder Right  01/05/2014   CLINICAL DATA:  58 year old female with right anterior shoulder pain. No known injury.  EXAM: RIGHT SHOULDER - 2+ VIEW  COMPARISON:  Prior chest x-ray 04/03/2012  FINDINGS: There is no evidence of fracture or dislocation. There is no evidence of arthropathy or other focal bone abnormality. Very mild degenerative change at the insertion of the rotator cuff. Soft tissues are unremarkable.  IMPRESSION: Negative.   Electronically Signed   By: Jacqulynn Cadet M.D.   On: 01/05/2014 08:00    Dg Knee Complete 4 Views Right  01/05/2014   CLINICAL DATA:  RIGHT knee pain with weight-bearing in and swelling for past 2 weeks, no known injury, history of RIGHT knee surgery June 2014  EXAM: RIGHT KNEE - COMPLETE 4+ VIEW  COMPARISON:  Preoperative exam 01/31/2007  FINDINGS: Osseous demineralization.  Components of RIGHT knee prosthesis in expected positions.  No acute fracture, dislocation, or bone destruction.  Mild anterior soft tissue swelling at infrapatellar region.  No knee joint effusion or periprosthetic lucency.  IMPRESSION: Osseous demineralization with postsurgical changes of RIGHT total knee arthroplasty.  Anterior infrapatellar soft tissue swelling without acute bony abnormalities.   Electronically Signed   By: Lavonia Dana M.D.   On: 01/05/2014 08:02     EKG Interpretation None  MDM   Final diagnoses:  Knee pain  Shoulder pain, acute, right    Pt with right shoulder right knee pain for a month. No injuries. Hx of right knee orthroplasty a year ago. No evidence of joint infection or instability. Pt unable to see her orthopedist due to losing her insurance. Will get xrays.    8:12 AM Pt feels much better with percocet. Xrays negative for acute process. She is neurovascularly intact. Will dc home with norco, naprosyn, follow up with wellness center. Return precautions discussed.   Filed Vitals:   01/05/14 0630 01/05/14 0645 01/05/14 0700 01/05/14 0715  BP: 180/95 147/118 169/104 162/93  Pulse: 72 67 67 64  Temp:      TempSrc:      Resp:      Height:      Weight:      SpO2: 97% 96% 97% 97%       Renold Genta, PA-C 01/05/14 [place], MD 01/26/14 1525

## 2014-01-05 NOTE — ED Notes (Signed)
Pt. Given crackers and a Sprite

## 2014-01-05 NOTE — Discharge Instructions (Signed)
Start stretching and excercises on shoulder and knee. Ice. Elevate. Naprosyn for pain and inflammation. Norco for severe pain. Follow up with wellness center.    Shoulder Pain The shoulder is the joint that connects your arms to your body. The bones that form the shoulder joint include the upper arm bone (humerus), the shoulder blade (scapula), and the collarbone (clavicle). The top of the humerus is shaped like a ball and fits into a rather flat socket on the scapula (glenoid cavity). A combination of muscles and strong, fibrous tissues that connect muscles to bones (tendons) support your shoulder joint and hold the ball in the socket. Small, fluid-filled sacs (bursae) are located in different areas of the joint. They act as cushions between the bones and the overlying soft tissues and help reduce friction between the gliding tendons and the bone as you move your arm. Your shoulder joint allows a wide range of motion in your arm. This range of motion allows you to do things like scratch your back or throw a ball. However, this range of motion also makes your shoulder more prone to pain from overuse and injury. Causes of shoulder pain can originate from both injury and overuse and usually can be grouped in the following four categories:  Redness, swelling, and pain (inflammation) of the tendon (tendinitis) or the bursae (bursitis).  Instability, such as a dislocation of the joint.  Inflammation of the joint (arthritis).  Broken bone (fracture). HOME CARE INSTRUCTIONS   Apply ice to the sore area.  Put ice in a plastic bag.  Place a towel between your skin and the bag.  Leave the ice on for 15-20 minutes, 3-4 times per day for the first 2 days, or as directed by your health care provider.  Stop using cold packs if they do not help with the pain.  If you have a shoulder sling or immobilizer, wear it as long as your caregiver instructs. Only remove it to shower or bathe. Move your arm as  [place] as possible, but keep your hand moving to prevent swelling.  Squeeze a soft ball or foam pad as much as possible to help prevent swelling.  Only take over-the-counter or prescription medicines for pain, discomfort, or fever as directed by your caregiver. SEEK MEDICAL CARE IF:   Your shoulder pain increases, or new pain develops in your arm, hand, or fingers.  Your hand or fingers become cold and numb.  Your pain is not relieved with medicines. SEEK IMMEDIATE MEDICAL CARE IF:   Your arm, hand, or fingers are numb or tingling.  Your arm, hand, or fingers are significantly swollen or turn white or blue. MAKE SURE YOU:   Understand these instructions.  Will watch your condition.  Will get help right away if you are not doing well or get worse. Document Released: 10/12/2004 Document Revised: 05/19/2013 Document Reviewed: 12/17/2010 [facility] Patient Information 2015 [place], [place]. This information is not intended to replace advice given to you by your health care provider. Make sure you discuss any questions you have with your health care provider.    Shoulder Exercises EXERCISES  RANGE OF MOTION (ROM) AND STRETCHING EXERCISES These exercises may help you when beginning to rehabilitate your injury. Your symptoms may resolve with or without further involvement from your physician, physical therapist or athletic trainer. While completing these exercises, remember:   Restoring tissue flexibility helps normal motion to return to the joints. This allows healthier, less painful movement and activity.  An effective stretch should  be held for at least 30 seconds.  A stretch should never be painful. You should only feel a gentle lengthening or release in the stretched tissue. ROM - Pendulum  Bend at the waist so that your right / left arm falls away from your body. Support yourself with your opposite hand on a solid surface, such as a table or a countertop.  Your right / left arm  should be perpendicular to the ground. If it is not perpendicular, you need to lean over farther. Relax the muscles in your right / left arm and shoulder as much as possible.  Gently sway your hips and trunk so they move your right / left arm without any use of your right / left shoulder muscles.  Progress your movements so that your right / left arm moves side to side, then forward and backward, and finally, both clockwise and counterclockwise.  Complete __________ repetitions in each direction. Many people use this exercise to relieve discomfort in their shoulder as well as to gain range of motion. Repeat __________ times. Complete this exercise __________ times per day. STRETCH - Flexion, Standing  Stand with good posture. With an underhand grip on your right / left hand and an overhand grip on the opposite hand, grasp a broomstick or cane so that your hands are a [place] more than shoulder-width apart.  Keeping your right / left elbow straight and shoulder muscles relaxed, push the stick with your opposite hand to raise your right / left arm in front of your body and then overhead. Raise your arm until you feel a stretch in your right / left shoulder, but before you have increased shoulder pain.  Try to avoid shrugging your right / left shoulder as your arm rises by keeping your shoulder blade tucked down and toward your mid-back spine. Hold __________ seconds.  Slowly return to the starting position. Repeat __________ times. Complete this exercise __________ times per day. STRETCH - Internal Rotation  Place your right / left hand behind your back, palm-up.  Throw a towel or belt over your opposite shoulder. Grasp the towel/belt with your right / left hand.  While keeping an upright posture, gently pull up on the towel/belt until you feel a stretch in the front of your right / left shoulder.  Avoid shrugging your right / left shoulder as your arm rises by keeping your shoulder blade  tucked down and toward your mid-back spine.  Hold __________. Release the stretch by lowering your opposite hand. Repeat __________ times. Complete this exercise __________ times per day. STRETCH - External Rotation and Abduction  Stagger your stance through a doorframe. It does not matter which foot is forward.  As instructed by your physician, physical therapist or athletic trainer, place your hands:  And forearms above your head and on the door frame.  And forearms at head-height and on the door frame.  At elbow-height and on the door frame.  Keeping your head and chest upright and your stomach muscles tight to prevent over-extending your low-back, slowly shift your weight onto your front foot until you feel a stretch across your chest and/or in the front of your shoulders.  Hold __________ seconds. Shift your weight to your back foot to release the stretch. Repeat __________ times. Complete this stretch __________ times per day.  STRENGTHENING EXERCISES  These exercises may help you when beginning to rehabilitate your injury. They may resolve your symptoms with or without further involvement from your physician, physical therapist or athletic  trainer. While completing these exercises, remember:   Muscles can gain both the endurance and the strength needed for everyday activities through controlled exercises.  Complete these exercises as instructed by your physician, physical therapist or athletic trainer. Progress the resistance and repetitions only as guided.  You may experience muscle soreness or fatigue, but the pain or discomfort you are trying to eliminate should never worsen during these exercises. If this pain does worsen, stop and make certain you are following the directions exactly. If the pain is still present after adjustments, discontinue the exercise until you can discuss the trouble with your clinician.  If advised by your physician, during your recovery, avoid  activity or exercises which involve actions that place your right / left hand or elbow above your head or behind your back or head. These positions stress the tissues which are trying to heal. STRENGTH - Scapular Depression and Adduction  With good posture, sit on a firm chair. Supported your arms in front of you with pillows, arm rests or a table top. Have your elbows in line with the sides of your body.  Gently draw your shoulder blades down and toward your mid-back spine. Gradually increase the tension without tensing the muscles along the top of your shoulders and the back of your neck.  Hold for __________ seconds. Slowly release the tension and relax your muscles completely before completing the next repetition.  After you have practiced this exercise, remove the arm support and complete it in standing as well as sitting. Repeat __________ times. Complete this exercise __________ times per day.  STRENGTH - External Rotators  Secure a rubber exercise band/tubing to a fixed object so that it is at the same height as your right / left elbow when you are standing or sitting on a firm surface.  Stand or sit so that the secured exercise band/tubing is at your side that is not injured.  Bend your elbow 90 degrees. Place a folded towel or small pillow under your right / left arm so that your elbow is a few inches away from your side.  Keeping the tension on the exercise band/tubing, pull it away from your body, as if pivoting on your elbow. Be sure to keep your body steady so that the movement is only coming from your shoulder rotating.  Hold __________ seconds. Release the tension in a controlled manner as you return to the starting position. Repeat __________ times. Complete this exercise __________ times per day.  STRENGTH - Supraspinatus  Stand or sit with good posture. Grasp a __________ weight or an exercise band/tubing so that your hand is "thumbs-up," like when you shake  hands.  Slowly lift your right / left hand from your thigh into the air, traveling about 30 degrees from straight out at your side. Lift your hand to shoulder height or as far as you can without increasing any shoulder pain. Initially, many people do not lift their hands above shoulder height.  Avoid shrugging your right / left shoulder as your arm rises by keeping your shoulder blade tucked down and toward your mid-back spine.  Hold for __________ seconds. Control the descent of your hand as you slowly return to your starting position. Repeat __________ times. Complete this exercise __________ times per day.  STRENGTH - Shoulder Extensors  Secure a rubber exercise band/tubing so that it is at the height of your shoulders when you are either standing or sitting on a firm arm-less chair.  With a thumbs-up grip,  grasp an end of the band/tubing in each hand. Straighten your elbows and lift your hands straight in front of you at shoulder height. Step back away from the secured end of band/tubing until it becomes tense.  Squeezing your shoulder blades together, pull your hands down to the sides of your thighs. Do not allow your hands to go behind you.  Hold for __________ seconds. Slowly ease the tension on the band/tubing as you reverse the directions and return to the starting position. Repeat __________ times. Complete this exercise __________ times per day.  STRENGTH - Scapular Retractors  Secure a rubber exercise band/tubing so that it is at the height of your shoulders when you are either standing or sitting on a firm arm-less chair.  With a palm-down grip, grasp an end of the band/tubing in each hand. Straighten your elbows and lift your hands straight in front of you at shoulder height. Step back away from the secured end of band/tubing until it becomes tense.  Squeezing your shoulder blades together, draw your elbows back as you bend them. Keep your upper arm lifted away from your body  throughout the exercise.  Hold __________ seconds. Slowly ease the tension on the band/tubing as you reverse the directions and return to the starting position. Repeat __________ times. Complete this exercise __________ times per day. STRENGTH - Scapular Depressors  Find a sturdy chair without wheels, such as a from a dining room table.  Keeping your feet on the floor, lift your bottom from the seat and lock your elbows.  Keeping your elbows straight, allow gravity to pull your body weight down. Your shoulders will rise toward your ears.  Raise your body against gravity by drawing your shoulder blades down your back, shortening the distance between your shoulders and ears. Although your feet should always maintain contact with the floor, your feet should progressively support less body weight as you get stronger.  Hold __________ seconds. In a controlled and slow manner, lower your body weight to begin the next repetition. Repeat __________ times. Complete this exercise __________ times per day.  Document Released: 11/16/2004 Document Revised: 03/27/2011 Document Reviewed: 04/16/2008 [facility] Patient Information 2015 [place], [place]. This information is not intended to replace advice given to you by your health care provider. Make sure you discuss any questions you have with your health care provider.  Knee Exercises EXERCISES RANGE OF MOTION (ROM) AND STRETCHING EXERCISES These exercises may help you when beginning to rehabilitate your injury. Your symptoms may resolve with or without further involvement from your physician, physical therapist, or athletic trainer. While completing these exercises, remember:   Restoring tissue flexibility helps normal motion to return to the joints. This allows healthier, less painful movement and activity.  An effective stretch should be held for at least 30 seconds.  A stretch should never be painful. You should only feel a gentle lengthening or  release in the stretched tissue. STRETCH - Knee Extension, Prone  Lie on your stomach on a firm surface, such as a bed or countertop. Place your right / left knee and leg just beyond the edge of the surface. You may wish to place a towel under the far end of your right / left thigh for comfort.  Relax your leg muscles and allow gravity to straighten your knee. Your clinician may advise you to add an ankle weight if more resistance is helpful for you.  You should feel a stretch in the back of your right / left knee. Hold  this position for __________ seconds. Repeat __________ times. Complete this stretch __________ times per day. * Your physician, physical therapist, or athletic trainer may ask you to add ankle weight to enhance your stretch.  RANGE OF MOTION - Knee Flexion, Active  Lie on your back with both knees straight. (If this causes back discomfort, bend your opposite knee, placing your foot flat on the floor.)  Slowly slide your heel back toward your buttocks until you feel a gentle stretch in the front of your knee or thigh.  Hold for __________ seconds. Slowly slide your heel back to the starting position. Repeat __________ times. Complete this exercise __________ times per day.  STRETCH - Quadriceps, Prone   Lie on your stomach on a firm surface, such as a bed or padded floor.  Bend your right / left knee and grasp your ankle. If you are unable to reach your ankle or pant leg, use a belt around your foot to lengthen your reach.  Gently pull your heel toward your buttocks. Your knee should not slide out to the side. You should feel a stretch in the front of your thigh and/or knee.  Hold this position for __________ seconds. Repeat __________ times. Complete this stretch __________ times per day.  STRETCH - Hamstrings, Supine   Lie on your back. Loop a belt or towel over the ball of your right / left foot.  Straighten your right / left knee and slowly pull on the belt to  raise your leg. Do not allow the right / left knee to bend. Keep your opposite leg flat on the floor.  Raise the leg until you feel a gentle stretch behind your right / left knee or thigh. Hold this position for __________ seconds. Repeat __________ times. Complete this stretch __________ times per day.  STRENGTHENING EXERCISES These exercises may help you when beginning to rehabilitate your injury. They may resolve your symptoms with or without further involvement from your physician, physical therapist, or athletic trainer. While completing these exercises, remember:   Muscles can gain both the endurance and the strength needed for everyday activities through controlled exercises.  Complete these exercises as instructed by your physician, physical therapist, or athletic trainer. Progress the resistance and repetitions only as guided.  You may experience muscle soreness or fatigue, but the pain or discomfort you are trying to eliminate should never worsen during these exercises. If this pain does worsen, stop and make certain you are following the directions exactly. If the pain is still present after adjustments, discontinue the exercise until you can discuss the trouble with your clinician. STRENGTH - Quadriceps, Isometrics  Lie on your back with your right / left leg extended and your opposite knee bent.  Gradually tense the muscles in the front of your right / left thigh. You should see either your knee cap slide up toward your hip or increased dimpling just above the knee. This motion will push the back of the knee down toward the floor/mat/bed on which you are lying.  Hold the muscle as tight as you can without increasing your pain for __________ seconds.  Relax the muscles slowly and completely in between each repetition. Repeat __________ times. Complete this exercise __________ times per day.  STRENGTH - Quadriceps, Short Arcs   Lie on your back. Place a __________ inch towel roll  under your knee so that the knee slightly bends.  Raise only your lower leg by tightening the muscles in the front of your thigh. Do  not allow your thigh to rise.  Hold this position for __________ seconds. Repeat __________ times. Complete this exercise __________ times per day.  OPTIONAL ANKLE WEIGHTS: Begin with ____________________, but DO NOT exceed ____________________. Increase in 1 pound/0.5 kilogram increments.  STRENGTH - Quadriceps, Straight Leg Raises  Quality counts! Watch for signs that the quadriceps muscle is working to insure you are strengthening the correct muscles and not "cheating" by substituting with healthier muscles.  Lay on your back with your right / left leg extended and your opposite knee bent.  Tense the muscles in the front of your right / left thigh. You should see either your knee cap slide up or increased dimpling just above the knee. Your thigh may even quiver.  Tighten these muscles even more and raise your leg 4 to 6 inches off the floor. Hold for __________ seconds.  Keeping these muscles tense, lower your leg.  Relax the muscles slowly and completely in between each repetition. Repeat __________ times. Complete this exercise __________ times per day.  STRENGTH - Hamstring, Curls  Lay on your stomach with your legs extended. (If you lay on a bed, your feet may hang over the edge.)  Tighten the muscles in the back of your thigh to bend your right / left knee up to 90 degrees. Keep your hips flat on the bed/floor.  Hold this position for __________ seconds.  Slowly lower your leg back to the starting position. Repeat __________ times. Complete this exercise __________ times per day.  OPTIONAL ANKLE WEIGHTS: Begin with ____________________, but DO NOT exceed ____________________. Increase in 1 pound/0.5 kilogram increments.  STRENGTH - Quadriceps, Squats  Stand in a door frame so that your feet and knees are in line with the frame.  Use your hands  for balance, not support, on the frame.  Slowly lower your weight, bending at the hips and knees. Keep your lower legs upright so that they are parallel with the door frame. Squat only within the range that does not increase your knee pain. Never let your hips drop below your knees.  Slowly return upright, pushing with your legs, not pulling with your hands. Repeat __________ times. Complete this exercise __________ times per day.  STRENGTH - Quadriceps, Wall Slides  Follow guidelines for form closely. Increased knee pain often results from poorly placed feet or knees.  Lean against a smooth wall or door and walk your feet out 18-24 inches. Place your feet hip-width apart.  Slowly slide down the wall or door until your knees bend __________ degrees.* Keep your knees over your heels, not your toes, and in line with your hips, not falling to either side.  Hold for __________ seconds. Stand up to rest for __________ seconds in between each repetition. Repeat __________ times. Complete this exercise __________ times per day. * Your physician, physical therapist, or athletic trainer will alter this angle based on your symptoms and progress. Document Released: 11/16/2004 Document Revised: 05/19/2013 Document Reviewed: 04/16/2008 [facility] Patient Information 2015 [place], [place]. This information is not intended to replace advice given to you by your health care provider. Make sure you discuss any questions you have with your health care provider.

## 2014-01-05 NOTE — ED Notes (Signed)
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 Pt denies injury to either body part. Pt reports she is able to ambulate however it is painful. Pt reports pain has been ongoing for more than a month.

## 2014-01-05 NOTE — ED Notes (Signed)
Pt placed in a gown BP cuff on and pulse ox on

## 2014-02-11 ENCOUNTER — Ambulatory Visit: Payer: Self-pay | Attending: Internal Medicine | Admitting: Internal Medicine

## 2014-02-11 ENCOUNTER — Encounter: Payer: Self-pay | Admitting: Internal Medicine

## 2014-02-11 VITALS — BP 175/102 | HR 88 | Temp 98.4°F | Resp 16 | Ht 67.5 in | Wt 194.0 lb

## 2014-02-11 DIAGNOSIS — E785 Hyperlipidemia, unspecified: Secondary | ICD-10-CM | POA: Insufficient documentation

## 2014-02-11 DIAGNOSIS — M1990 Unspecified osteoarthritis, unspecified site: Secondary | ICD-10-CM | POA: Insufficient documentation

## 2014-02-11 DIAGNOSIS — Z87891 Personal history of nicotine dependence: Secondary | ICD-10-CM | POA: Insufficient documentation

## 2014-02-11 DIAGNOSIS — I10 Essential (primary) hypertension: Secondary | ICD-10-CM | POA: Insufficient documentation

## 2014-02-11 DIAGNOSIS — Z23 Encounter for immunization: Secondary | ICD-10-CM | POA: Insufficient documentation

## 2014-02-11 MED ORDER — CLONIDINE HCL 0.1 MG PO TABS
0.1000 mg | ORAL_TABLET | Freq: Once | ORAL | Status: AC
  Administered 2014-02-11: 0.1 mg via ORAL

## 2014-02-11 MED ORDER — AMLODIPINE BESYLATE 10 MG PO TABS: 10.0000 mg | ORAL_TABLET | Freq: Every day | ORAL | Status: DC

## 2014-02-11 MED ORDER — LOVASTATIN 20 MG PO TABS: 20.0000 mg | ORAL_TABLET | Freq: Every day | ORAL | Status: DC

## 2014-02-11 NOTE — Progress Notes (Signed)
Pt is here to establish care. Pt has a history of HTN. Pt has been out of medications for about 6 months. Pt reports having a knee replacement over a year ago and she still has some pain and it gives out on her some times. Pt also states that she has extreme pain in her right shoulder. For about 2 months.

## 2014-02-11 NOTE — Progress Notes (Signed)
Patient ID: Alison Ford, female   DOB: 12-Jun-1955, 59 y.o.   MRN: [id number]  XVQ:[id number]  KDT:[id number]  DOB - [birth year]  CC:  Chief Complaint  Patient presents with  . Establish Care       HPI: Alison Ford is a 59 y.o. female here today to establish medical care.  Patient has a past medical history of hypertension, HLD, and DJD.  Patient reports that she has been out of her blood pressure medication for 6 months. She was on amlodipine 10 mg and lovastatin 20 mg daily.  She reports that she was on one more medication but does not remember the name of the medication.  Today she reports urinary frequency and urgency.  Drinks 3 cups of coffee per day. No dysuria, fever, or chills.    Patient has No headache, No chest pain, No abdominal pain - No Nausea, No new weakness tingling or numbness, No Cough - SOB.  Allergies  Allergen Reactions  . Penicillins    Past Medical History  Diagnosis Date  . Hyperlipidemia     takes Lovastatin nightly  . Right knee DJD   . HTN (hypertension)     takes Amlodipine daily and HCTZ   . Heart murmur     slight  . Headache(784.0)   . Weakness     left hand  . Joint pain   . Back pain     d/t knee pain  . Abnormal facial hair    Current Outpatient Prescriptions on File Prior to Visit  Medication Sig Dispense Refill  . aspirin EC 325 MG EC tablet 1 tablet 2 times a day to prevent blood clots (Patient not taking: Reported on 01/05/2014) 60 tablet 0   No current facility-administered medications on file prior to visit.   Family History  Problem Relation Age of Onset  . CAD Father 41  . CAD Mother 57   History   Social History  . Marital Status: Legally Separated    Spouse Name: N/A    Number of Children: 3  . Years of Education: N/A   Occupational History  .  [employer]   Social History Main Topics  . Smoking status: Former Research scientist (life sciences)  . Smokeless tobacco: Not on file     Comment: quit on May 19,2014  . Alcohol Use: No  .  Drug Use: No  . Sexual Activity: No   Other Topics Concern  . Not on file   Social History Narrative   Lives with daughter and two grandsons.     Review of Systems  Constitutional: Negative for fever and chills.  Eyes: Negative.   Respiratory: Negative.   Cardiovascular: Negative.   Musculoskeletal: Positive for joint pain (right knee and right shoulder).  Skin: Positive for rash (dry scaly hands).  Neurological: Positive for tingling (right arm, leg). Negative for dizziness and headaches.      Objective:   Filed Vitals:   02/11/14 1429  BP: 175/102  Pulse: 88  Temp: 98.4 F (36.9 C)  Resp: 16    Physical Exam: Constitutional: Patient appears well-developed and well-nourished. No distress. HENT: Normocephalic, atraumatic, External right and left ear normal. Oropharynx is clear and moist.  Eyes: Conjunctivae and EOM are normal. PERRLA, no scleral icterus. Neck: Normal ROM. Neck supple. No JVD. No tracheal deviation. No thyromegaly. CVS: RRR, S1/S2 +, no murmurs, no gallops, no carotid bruit.  Pulmonary: Effort and breath sounds normal, no stridor, rhonchi, wheezes, rales.  Abdominal: Soft. BS +, no distension,  tenderness, rebound or guarding.  Musculoskeletal: Normal range of motion. No edema and no tenderness.  Neuro: Alert.  Skin: Skin is warm and dry. No rash noted. Not diaphoretic. No erythema. No pallor. Psychiatric: Normal mood and affect. Behavior, judgment, thought content normal.  Lab Results  Component Value Date   WBC 13.0* 07/16/2012   HGB 12.7 07/16/2012   HCT 37.2 07/16/2012   MCV 90.3 07/16/2012   PLT 249 07/16/2012   Lab Results  Component Value Date   CREATININE 0.51 07/16/2012   BUN 10 07/16/2012   NA 139 07/16/2012   K 4.3 07/16/2012   CL 105 07/16/2012   CO2 27 07/16/2012    No results found for: HGBA1C Lipid Panel  No results found for: CHOL, TRIG, HDL, CHOLHDL, VLDL, LDLCALC     Assessment and plan:   [name] was seen today  for establish care.  Diagnoses and associated orders for this visit:  Essential hypertension - cloNIDine (CATAPRES) tablet 0.1 mg; Take 1 tablet (0.1 mg total) by mouth once. - amLODipine (NORVASC) 10 MG tablet; Take 1 tablet (10 mg total) by mouth daily. - CBC; Future - COMPLETE METABOLIC PANEL WITH GFR; Future  HLD (hyperlipidemia) - lovastatin (MEVACOR) 20 MG tablet; Take 1 tablet (20 mg total) by mouth at bedtime. - Lipid panel; Future - Hemoglobin A1c; Future  Need for prophylactic vaccination and inoculation against influenza - Flu Vaccine QUAD 36+ mos IM   Return in about 2 weeks (around 02/25/2014) for Nurse Visit-BP check/Lab visit and 3 mo PCP.  The patient was given clear instructions to go to ER or return to medical center if symptoms don't improve, worsen or new problems develop. The patient verbalized understanding. The patient was told to call to get lab results if they haven't heard anything in the next week.     Chari Manning, NP-C [facility] [phone] 02/11/2014, 2:57 PM

## 2014-03-05 ENCOUNTER — Ambulatory Visit: Payer: Self-pay | Attending: Internal Medicine | Admitting: *Deleted

## 2014-03-05 VITALS — BP 161/76 | HR 74 | Temp 98.5°F | Resp 20

## 2014-03-05 DIAGNOSIS — E785 Hyperlipidemia, unspecified: Secondary | ICD-10-CM | POA: Insufficient documentation

## 2014-03-05 DIAGNOSIS — I10 Essential (primary) hypertension: Secondary | ICD-10-CM | POA: Insufficient documentation

## 2014-03-05 MED ORDER — LISINOPRIL 10 MG PO TABS: 10.0000 mg | ORAL_TABLET | Freq: Every day | ORAL | Status: DC

## 2014-03-05 NOTE — Progress Notes (Signed)
Patient presents for BP check Med list reviewed; states taking all meds as directed Discussed need for low sodium diet and using Mrs. Dash as alternative to salt Riding stationary bike 10 minutes daily Patient denies blurred vision, SHOB, chest pain or pressure C/o headache 2 x per week over last 2 months; rates 8/10 C/o chronic right shoulder pain and right knee pain Denies headache at present Smoking .5 ppd; last cig 20 minutes ago  BP 161/76 P 74 R 20   T  98.5 oral SPO2  97%  Per PCP: Add lisinopril 10 mg daily May take tylenol arthritis prn Avoid all NSAIDS  Return in 2 weeks for nurse visit for BP check  Patient advised to call for med refills at least 7 days before running out so as not to go without. Patient aware that she is to f/u with PCP 3 months from last visit (Due 04/2714)  Patient given literature on DASH Eating Plan

## 2014-03-05 NOTE — Patient Instructions (Signed)
DASH Eating Plan °DASH stands for "Dietary Approaches to Stop Hypertension." The DASH eating plan is a healthy eating plan that has been shown to reduce high blood pressure (hypertension). Additional health benefits may include reducing the risk of type 2 diabetes mellitus, heart disease, and stroke. The DASH eating plan may also help with weight loss. °WHAT DO I NEED TO KNOW ABOUT THE DASH EATING PLAN? °For the DASH eating plan, you will follow these general guidelines: °· Choose foods with a percent daily value for sodium of less than 5% (as listed on the food label). °· Use salt-free seasonings or herbs instead of table salt or sea salt. °· Check with your health care provider or pharmacist before using salt substitutes. °· Eat lower-sodium products, often labeled as "lower sodium" or "no salt added." °· Eat fresh foods. °· Eat more vegetables, fruits, and low-fat dairy products. °· Choose whole grains. Look for the word "whole" as the first word in the ingredient list. °· Choose fish and skinless chicken or turkey more often than red meat. Limit fish, poultry, and meat to 6 oz (170 g) each day. °· Limit sweets, desserts, sugars, and sugary drinks. °· Choose heart-healthy fats. °· Limit cheese to 1 oz (28 g) per day. °· Eat more home-cooked food and less restaurant, buffet, and fast food. °· Limit fried foods. °· Cook foods using methods other than frying. °· Limit canned vegetables. If you do use them, rinse them well to decrease the sodium. °· When eating at a restaurant, ask that your food be prepared with less salt, or no salt if possible. °WHAT FOODS CAN I EAT? °Seek help from a dietitian for individual calorie needs. °Grains °Whole grain or whole wheat bread. Brown rice. Whole grain or whole wheat pasta. Quinoa, bulgur, and whole grain cereals. Low-sodium cereals. Corn or whole wheat flour tortillas. Whole grain cornbread. Whole grain crackers. Low-sodium crackers. °Vegetables °Fresh or frozen vegetables  (raw, steamed, roasted, or grilled). Low-sodium or reduced-sodium tomato and vegetable juices. Low-sodium or reduced-sodium tomato sauce and paste. Low-sodium or reduced-sodium canned vegetables.  °Fruits °All fresh, canned (in natural juice), or frozen fruits. °Meat and Other Protein Products °Ground beef (85% or leaner), grass-fed beef, or beef trimmed of fat. Skinless chicken or turkey. Ground chicken or turkey. Pork trimmed of fat. All fish and seafood. Eggs. Dried beans, peas, or lentils. Unsalted nuts and seeds. Unsalted canned beans. °Dairy °Low-fat dairy products, such as skim or 1% milk, 2% or reduced-fat cheeses, low-fat ricotta or cottage cheese, or plain low-fat yogurt. Low-sodium or reduced-sodium cheeses. °Fats and Oils °Tub margarines without trans fats. Light or reduced-fat mayonnaise and salad dressings (reduced sodium). Avocado. Safflower, olive, or canola oils. Natural peanut or almond butter. °Other °Unsalted popcorn and pretzels. °The items listed above may not be a complete list of recommended foods or beverages. Contact your dietitian for more options. °WHAT FOODS ARE NOT RECOMMENDED? °Grains °White bread. White pasta. White rice. Refined cornbread. Bagels and croissants. Crackers that contain trans fat. °Vegetables °Creamed or fried vegetables. Vegetables in a cheese sauce. Regular canned vegetables. Regular canned tomato sauce and paste. Regular tomato and vegetable juices. °Fruits °Dried fruits. Canned fruit in light or heavy syrup. Fruit juice. °Meat and Other Protein Products °Fatty cuts of meat. Ribs, chicken wings, bacon, sausage, bologna, salami, chitterlings, fatback, hot dogs, bratwurst, and packaged luncheon meats. Salted nuts and seeds. Canned beans with salt. °Dairy °Whole or 2% milk, cream, half-and-half, and cream cheese. Whole-fat or sweetened yogurt. Full-fat   cheeses or blue cheese. Nondairy creamers and whipped toppings. Processed cheese, cheese spreads, or cheese  curds. °Condiments °Onion and garlic salt, seasoned salt, table salt, and sea salt. Canned and packaged gravies. Worcestershire sauce. Tartar sauce. Barbecue sauce. Teriyaki sauce. Soy sauce, including reduced sodium. Steak sauce. Fish sauce. Oyster sauce. Cocktail sauce. Horseradish. Ketchup and mustard. Meat flavorings and tenderizers. Bouillon cubes. Hot sauce. Tabasco sauce. Marinades. Taco seasonings. Relishes. °Fats and Oils °Butter, stick margarine, lard, shortening, ghee, and bacon fat. Coconut, palm kernel, or palm oils. Regular salad dressings. °Other °Pickles and olives. Salted popcorn and pretzels. °The items listed above may not be a complete list of foods and beverages to avoid. Contact your dietitian for more information. °WHERE CAN I FIND MORE INFORMATION? °National Heart, Lung, and [employer] Institute: www.nhlbi.nih.gov/health/health-topics/topics/dash/ °Document Released: 12/22/2010 Document Revised: 05/19/2013 Document Reviewed: 11/06/2012 °ExitCare® Patient Information ©2015 [employer]. This information is not intended to replace advice given to you by your health care provider. Make sure you discuss any questions you have with your health care provider. ° °

## 2014-03-17 ENCOUNTER — Telehealth: Payer: Self-pay | Admitting: *Deleted

## 2014-03-17 ENCOUNTER — Ambulatory Visit: Payer: Self-pay | Attending: Internal Medicine | Admitting: Family Medicine

## 2014-03-17 VITALS — BP 150/80 | HR 68 | Temp 98.4°F | Resp 20

## 2014-03-17 DIAGNOSIS — L03319 Cellulitis of trunk, unspecified: Secondary | ICD-10-CM

## 2014-03-17 DIAGNOSIS — L02229 Furuncle of trunk, unspecified: Secondary | ICD-10-CM | POA: Insufficient documentation

## 2014-03-17 DIAGNOSIS — F17210 Nicotine dependence, cigarettes, uncomplicated: Secondary | ICD-10-CM | POA: Insufficient documentation

## 2014-03-17 DIAGNOSIS — I10 Essential (primary) hypertension: Secondary | ICD-10-CM

## 2014-03-17 DIAGNOSIS — L02219 Cutaneous abscess of trunk, unspecified: Secondary | ICD-10-CM

## 2014-03-17 DIAGNOSIS — E785 Hyperlipidemia, unspecified: Secondary | ICD-10-CM

## 2014-03-17 LAB — HEMOGLOBIN A1C
HEMOGLOBIN A1C: 6.2 % — AB (ref <5.7)
Mean Plasma Glucose: 131 mg/dL — ABNORMAL HIGH (ref <117)

## 2014-03-17 LAB — CBC
HEMATOCRIT: 42.7 % (ref 36.0–46.0)
HEMOGLOBIN: 14.4 g/dL (ref 12.0–15.0)
MCH: 31.2 pg (ref 26.0–34.0)
MCHC: 33.7 g/dL (ref 30.0–36.0)
MCV: 92.4 fL (ref 78.0–100.0)
MPV: 10.4 fL (ref 8.6–12.4)
Platelets: 272 10*3/uL (ref 150–400)
RBC: 4.62 MIL/uL (ref 3.87–5.11)
RDW: 12.7 % (ref 11.5–15.5)
WBC: 5.7 10*3/uL (ref 4.0–10.5)

## 2014-03-17 LAB — COMPLETE METABOLIC PANEL WITH GFR
ALBUMIN: 4.2 g/dL (ref 3.5–5.2)
ALT: 14 U/L (ref 0–35)
AST: 15 U/L (ref 0–37)
Alkaline Phosphatase: 75 U/L (ref 39–117)
BUN: 10 mg/dL (ref 6–23)
CALCIUM: 9.5 mg/dL (ref 8.4–10.5)
CHLORIDE: 105 meq/L (ref 96–112)
CO2: 26 mEq/L (ref 19–32)
CREATININE: 0.64 mg/dL (ref 0.50–1.10)
GFR, Est Non African American: >89 mL/min
Glucose, Bld: 88 mg/dL (ref 70–99)
Potassium: 5 mEq/L (ref 3.5–5.3)
Sodium: 140 mEq/L (ref 135–145)
Total Bilirubin: 0.4 mg/dL (ref 0.2–1.2)
Total Protein: 6.7 g/dL (ref 6.0–8.3)

## 2014-03-17 LAB — LIPID PANEL
CHOL/HDL RATIO: 4 ratio
Cholesterol: 223 mg/dL — ABNORMAL HIGH (ref 0–200)
HDL: 56 mg/dL (ref >=46)
LDL CALC: 126 mg/dL — AB (ref 0–99)
Triglycerides: 204 mg/dL — ABNORMAL HIGH (ref <150)
VLDL: 41 mg/dL — AB (ref 0–40)

## 2014-03-17 MED ORDER — LISINOPRIL 20 MG PO TABS: 20.0000 mg | ORAL_TABLET | Freq: Every day | ORAL | Status: DC

## 2014-03-17 MED ORDER — LISINOPRIL 10 MG PO TABS: 20.0000 mg | ORAL_TABLET | Freq: Every day | ORAL | Status: DC

## 2014-03-17 MED ORDER — SULFAMETHOXAZOLE-TRIMETHOPRIM 800-160 MG PO TABS: 1.0000 | ORAL_TABLET | Freq: Two times a day (BID) | ORAL | Status: AC

## 2014-03-17 NOTE — Progress Notes (Signed)
Patient presents for fasting lab and BP check after starting lisinopril Med list reviewed; states taking all meds as directed Following low sodium diet and using Mrs. Dash as alternative to salt Riding stationary bike 10-15 minutes every night Patient denies SHOB, chest pain or pressure States headaches are now less often. C/o blurred vision when reading. Wears reading glasses Smoking 3-4 cigs/day down from .5ppd. Trying to quit  BP 150/80  left arm manually with large cuff P 68 R 20  T  98.4 oral SPO2  96%  Patient advised to call for med refills at least 7 days before running out so as not to go without. Patient aware that she is to f/u with PCP 3 months from last visit (Due 05/13/14)

## 2014-03-17 NOTE — Progress Notes (Signed)
Patient states she has a boil to the left side of her abd The boil burst three days ago and has since been draining

## 2014-03-17 NOTE — Telephone Encounter (Signed)
Patient notified to increase lisinopril to 20 mg daily. Rx e-scribed to [facility] Pt to return in 3 weeks for nurse visit for BP check. Appt made for 04/07/14 at [place], NP  Velora Heckler, RN            May increase lisinopril to 20 mg QD and bring back in 3 weeks. Thanks

## 2014-03-17 NOTE — Addendum Note (Signed)
Addended by: Velora Heckler on: 03/17/2014 03:03 PM   Modules accepted: Orders

## 2014-03-17 NOTE — Patient Instructions (Addendum)
Smoking Cessation Quitting smoking is important to your health and has many advantages. However, it is not always easy to quit since nicotine is a very addictive drug. Oftentimes, people try 3 times or more before being able to quit. This document explains the best ways for you to prepare to quit smoking. Quitting takes hard work and a lot of effort, but you can do it. ADVANTAGES OF QUITTING SMOKING  You will live longer, feel better, and live better.  Your body will feel the impact of quitting smoking almost immediately.  Within 20 minutes, blood pressure decreases. Your pulse returns to its normal level.  After 8 hours, carbon monoxide levels in the blood return to normal. Your oxygen level increases.  After 24 hours, the chance of having a heart attack starts to decrease. Your breath, hair, and body stop smelling like smoke.  After 48 hours, damaged nerve endings begin to recover. Your sense of taste and smell improve.  After 72 hours, the body is virtually free of nicotine. Your bronchial tubes relax and breathing becomes easier.  After 2 to 12 weeks, lungs can hold more air. Exercise becomes easier and circulation improves.  The risk of having a heart attack, stroke, cancer, or lung disease is greatly reduced.  After 1 year, the risk of coronary heart disease is cut in half.  After 5 years, the risk of stroke falls to the same as a nonsmoker.  After 10 years, the risk of lung cancer is cut in half and the risk of other cancers decreases significantly.  After 15 years, the risk of coronary heart disease drops, usually to the level of a nonsmoker.  If you are pregnant, quitting smoking will improve your chances of having a healthy baby.  The people you live with, especially any children, will be healthier.  You will have extra money to spend on things other than cigarettes. QUESTIONS TO THINK ABOUT BEFORE ATTEMPTING TO QUIT You may want to talk about your answers with your  health care provider.  Why do you want to quit?  If you tried to quit in the past, what helped and what did not?  What will be the most difficult situations for you after you quit? How will you plan to handle them?  Who can help you through the tough times? Your family? Friends? A health care provider?  What pleasures do you get from smoking? What ways can you still get pleasure if you quit? Here are some questions to ask your health care provider:  How can you help me to be successful at quitting?  What medicine do you think would be best for me and how should I take it?  What should I do if I need more help?  What is smoking withdrawal like? How can I get information on withdrawal? GET READY  Set a quit date.  Change your environment by getting rid of all cigarettes, ashtrays, matches, and lighters in your home, car, or work. Do not let people smoke in your home.  Review your past attempts to quit. Think about what worked and what did not. GET SUPPORT AND ENCOURAGEMENT You have a better chance of being successful if you have help. You can get support in many ways.  Tell your family, friends, and coworkers that you are going to quit and need their support. Ask them not to smoke around you.  Get individual, group, or telephone counseling and support. Programs are available at local hospitals and health centers. Call   your local health department for information about programs in your area.  Spiritual beliefs and practices may help some smokers quit.  Download a "quit meter" on your computer to keep track of quit statistics, such as how long you have gone without smoking, cigarettes not smoked, and money saved.  Get a self-help book about quitting smoking and staying off tobacco. [place] yourself from urges to smoke. Talk to someone, go for a walk, or occupy your time with a task.  Change your normal routine. Take a different route to work.  Drink tea instead of coffee. Eat breakfast in a different place.  Reduce your stress. Take a hot bath, exercise, or read a book.  Plan something enjoyable to do every day. Reward yourself for not smoking.  Explore interactive web-based programs that specialize in helping you quit. GET MEDICINE AND USE IT CORRECTLY Medicines can help you stop smoking and decrease the urge to smoke. Combining medicine with the above behavioral methods and support can greatly increase your chances of successfully quitting smoking.  Nicotine replacement therapy helps deliver nicotine to your body without the negative effects and risks of smoking. Nicotine replacement therapy includes nicotine gum, lozenges, inhalers, nasal sprays, and skin patches. Some may be available over-the-counter and others require a prescription.  Antidepressant medicine helps people abstain from smoking, but how this works is unknown. This medicine is available by prescription.  Nicotinic receptor partial agonist medicine simulates the effect of nicotine in your brain. This medicine is available by prescription. Ask your health care provider for advice about which medicines to use and how to use them based on your health history. Your health care provider will tell you what side effects to look out for if you choose to be on a medicine or therapy. Carefully read the information on the package. Do not use any other product containing nicotine while using a nicotine replacement product.  RELAPSE OR DIFFICULT SITUATIONS Most relapses occur within the first 3 months after quitting. Do not be discouraged if you start smoking again. Remember, most people try several times before finally quitting. You may have symptoms of withdrawal because your body is used to nicotine. You may crave cigarettes, be irritable, feel very hungry, cough often, get headaches, or have difficulty concentrating. The withdrawal symptoms are only temporary. They are strongest  when you first quit, but they will go away within 10-14 days. To reduce the chances of relapse, try to:  Avoid drinking alcohol. Drinking lowers your chances of successfully quitting.  Reduce the amount of caffeine you consume. Once you quit smoking, the amount of caffeine in your body increases and can give you symptoms, such as a rapid heartbeat, sweating, and anxiety.  Avoid smokers because they can make you want to smoke.  Do not let weight gain distract you. Many smokers will gain weight when they quit, usually less than 10 pounds. Eat a healthy diet and stay active. You can always lose the weight gained after you quit.  Find ways to improve your mood other than smoking. FOR MORE INFORMATION  www.smokefree.gov  Document Released: 12/27/2000 Document Revised: 05/19/2013 Document Reviewed: 04/13/2011 [facility] Patient Information 2015 [place], [place]. This information is not intended to replace advice given to you by your health care provider. Make sure you discuss any questions you have with your health care provider.   For boil

## 2014-03-17 NOTE — Assessment & Plan Note (Signed)
There is a lesion on her left side. There are two distinct lesions. One is soft where it ruptured but has healed over. The other is a firm tender lump approximately 1x2 inches in diameter. The soft one is not tight.

## 2014-04-06 ENCOUNTER — Other Ambulatory Visit: Payer: Self-pay | Admitting: Internal Medicine

## 2014-04-28 ENCOUNTER — Other Ambulatory Visit: Payer: Self-pay | Admitting: Internal Medicine

## 2014-05-04 ENCOUNTER — Ambulatory Visit: Payer: Self-pay | Admitting: Internal Medicine

## 2014-05-07 ENCOUNTER — Ambulatory Visit: Payer: Self-pay | Attending: Internal Medicine | Admitting: Internal Medicine

## 2014-05-07 ENCOUNTER — Encounter: Payer: Self-pay | Admitting: Internal Medicine

## 2014-05-07 VITALS — BP 149/78 | HR 74 | Temp 98.8°F | Ht 67.5 in | Wt 195.6 lb

## 2014-05-07 DIAGNOSIS — E785 Hyperlipidemia, unspecified: Secondary | ICD-10-CM

## 2014-05-07 DIAGNOSIS — M25561 Pain in right knee: Secondary | ICD-10-CM

## 2014-05-07 DIAGNOSIS — L0201 Cutaneous abscess of face: Secondary | ICD-10-CM

## 2014-05-07 DIAGNOSIS — L03211 Cellulitis of face: Secondary | ICD-10-CM

## 2014-05-07 DIAGNOSIS — I10 Essential (primary) hypertension: Secondary | ICD-10-CM

## 2014-05-07 DIAGNOSIS — J302 Other seasonal allergic rhinitis: Secondary | ICD-10-CM

## 2014-05-07 MED ORDER — LOVASTATIN 40 MG PO TABS: 40.0000 mg | ORAL_TABLET | Freq: Every day | ORAL | Status: DC

## 2014-05-07 MED ORDER — LORATADINE 10 MG PO TABS: 10.0000 mg | ORAL_TABLET | Freq: Every day | ORAL | Status: DC

## 2014-05-07 MED ORDER — LISINOPRIL 20 MG PO TABS: 20.0000 mg | ORAL_TABLET | Freq: Every day | ORAL | Status: DC

## 2014-05-07 MED ORDER — SULFAMETHOXAZOLE-TRIMETHOPRIM 800-160 MG PO TABS: 1.0000 | ORAL_TABLET | Freq: Two times a day (BID) | ORAL | Status: DC

## 2014-05-07 NOTE — Progress Notes (Signed)
Patient here to follow up on hypertension.  She feels well.  She has not had her medication today due to needing refill.    Patient is having trouble with boils on her skin.  She has had a medication (sulfamethoxazole/tmp?) to help clear them but she is out and still having problems.  She tries to keep warm compresses on them but not getting better.

## 2014-05-07 NOTE — Progress Notes (Signed)
Patient ID: Alison Ford, female   DOB: 1955/05/15, 59 y.o.   MRN: [id number] Subjective:  Alison Ford is a 59 y.o. female with hypertension.  Reports that she has been out of her medication for one day.    Current Outpatient Prescriptions  Medication Sig Dispense Refill  . amLODipine (NORVASC) 10 MG tablet Take 1 tablet (10 mg total) by mouth daily. 30 tablet 4  . lisinopril (PRINIVIL,ZESTRIL) 10 MG tablet TAKE 1 TABLET BY MOUTH DAILY 30 tablet 0  . lisinopril (PRINIVIL,ZESTRIL) 20 MG tablet Take 1 tablet (20 mg total) by mouth daily. 30 tablet 2  . lovastatin (MEVACOR) 20 MG tablet Take 1 tablet (20 mg total) by mouth at bedtime. 30 tablet 4   No current facility-administered medications for this visit.    Hypertension ROS: taking medications as instructed, no medication side effects noted, no TIA's, no chest pain on exertion, no dyspnea on exertion, no swelling of ankles and no palpitations.  New concerns: 1 week abscess on face, non-draining but slightly painful. This is a recurring problem. Some c/o eye itching, sneezing, and PND.   Objective:  BP 149/78 mmHg  Pulse 74  Temp(Src) 98.8 F (37.1 C) (Oral)  Ht 5' 7.5" (1.715 m)  Wt 195 lb 9.6 oz (88.724 kg)  BMI 30.17 kg/m2  SpO2 92%  Appearance alert, well appearing, and in no distress and oriented to person, place, and time. General exam BP noted to be mildly elevated today in office though normal at other visits, S1, S2 normal, no gallop, no murmur, chest clear, no JVD, no HSM, no edema, CVS exam  - normal rate, regular rhythm, normal S1, S2, no murmurs, rubs, clicks or gallops, normal rate and regular rhythm, normal bilateral carotid upstroke without bruits, no JVD.  Lab review: labs are reviewed, up to date and normal.    Alison Ford was seen today for hypertension and recurrent skin infections.  Diagnoses and all orders for this visit:  Essential hypertension Orders: -    Refill  lisinopril (PRINIVIL,ZESTRIL) 20 MG  tablet; Take 1 tablet (20 mg total) by mouth daily. High blood pressure Reviewed diet, exercise and weight control. Recommended sodium restriction and medication compliance   HLD (hyperlipidemia) Orders: -    Refill lovastatin (MEVACOR) 40 MG tablet; Take 1 tablet (40 mg total) by mouth at bedtime. For Cholesterol  Cellulitis and abscess of face Orders: -     Ambulatory referral to Dermatology---for removal -     sulfamethoxazole-trimethoprim (BACTRIM DS,SEPTRA DS) 800-160 MG per tablet; Take 1 tablet by mouth 2 (two) times daily.  Right knee pain Orders: -     Ambulatory referral to Orthopedic Surgery  Seasonal allergies Orders: -     loratadine (CLARITIN) 10 MG tablet; Take 1 tablet (10 mg total) by mouth daily. For allergies  Follow up in 3 months   Alison Manning, NP 05/12/2014 10:10 PM

## 2014-06-01 ENCOUNTER — Encounter: Payer: Self-pay | Admitting: Internal Medicine

## 2014-06-01 ENCOUNTER — Ambulatory Visit: Payer: Self-pay | Attending: Internal Medicine | Admitting: Internal Medicine

## 2014-06-01 VITALS — Temp 98.3°F | Ht 67.5 in | Wt 195.0 lb

## 2014-06-01 DIAGNOSIS — I951 Orthostatic hypotension: Secondary | ICD-10-CM

## 2014-06-01 DIAGNOSIS — E785 Hyperlipidemia, unspecified: Secondary | ICD-10-CM

## 2014-06-01 DIAGNOSIS — F17200 Nicotine dependence, unspecified, uncomplicated: Secondary | ICD-10-CM | POA: Insufficient documentation

## 2014-06-01 DIAGNOSIS — L309 Dermatitis, unspecified: Secondary | ICD-10-CM

## 2014-06-01 DIAGNOSIS — I10 Essential (primary) hypertension: Secondary | ICD-10-CM

## 2014-06-01 DIAGNOSIS — Z720 Tobacco use: Secondary | ICD-10-CM

## 2014-06-01 MED ORDER — LISINOPRIL 20 MG PO TABS: 20.0000 mg | ORAL_TABLET | Freq: Every day | ORAL | Status: DC

## 2014-06-01 MED ORDER — LOVASTATIN 40 MG PO TABS: 40.0000 mg | ORAL_TABLET | Freq: Every day | ORAL | Status: DC

## 2014-06-01 MED ORDER — TRIAMCINOLONE ACETONIDE 0.1 % EX CREA: 1.0000 "application " | TOPICAL_CREAM | Freq: Two times a day (BID) | CUTANEOUS | Status: DC

## 2014-06-01 MED ORDER — AMLODIPINE BESYLATE 10 MG PO TABS: 10.0000 mg | ORAL_TABLET | Freq: Every day | ORAL | Status: DC

## 2014-06-01 NOTE — Progress Notes (Signed)
Patient ID: Alison Ford, female   DOB: 29-Mar-1955, 59 y.o.   MRN: [id number]  CC: dizziness   HPI: Alison Ford is a 59 y.o. female here today for a follow up visit.  Patient has past medical history of tobacco abuse, hypertension. HLD.  Patient presents today with c/o dizziness when she turns her head side to side and when she lifts it up from lying down.   She c/o of continued cyst on her face but has been unable to get to dermatology because she cannot afford to pay for the visit. She would like something different for her face. She would like clotrimazole for hands and feet. She states that the cream is the only thing that cleared the bumps from her hands.   Patient has No headache, No chest pain, No abdominal pain - No Nausea, No new weakness tingling or numbness, No Cough - SOB.  Allergies  Allergen Reactions  . Penicillins    Past Medical History  Diagnosis Date  . Hyperlipidemia     takes Lovastatin nightly  . Right knee DJD   . HTN (hypertension)     takes Amlodipine daily and HCTZ   . Heart murmur     slight  . Headache(784.0)   . Weakness     left hand  . Joint pain   . Back pain     d/t knee pain  . Abnormal facial hair    Current Outpatient Prescriptions on File Prior to Visit  Medication Sig Dispense Refill  . amLODipine (NORVASC) 10 MG tablet Take 1 tablet (10 mg total) by mouth daily. 30 tablet 4  . lisinopril (PRINIVIL,ZESTRIL) 20 MG tablet Take 1 tablet (20 mg total) by mouth daily. High blood pressure 30 tablet 5  . loratadine (CLARITIN) 10 MG tablet Take 1 tablet (10 mg total) by mouth daily. For allergies 30 tablet 5  . lovastatin (MEVACOR) 40 MG tablet Take 1 tablet (40 mg total) by mouth at bedtime. For Cholesterol 30 tablet 5   No current facility-administered medications on file prior to visit.   Family History  Problem Relation Age of Onset  . CAD Father 47  . CAD Mother 71  . Hypertension Mother    History   Social History  . Marital  Status: Legally Separated    Spouse Name: N/A  . Number of Children: 3  . Years of Education: N/A   Occupational History  .  [employer]   Social History Main Topics  . Smoking status: Current Every Day Smoker -- 0.50 packs/day  . Smokeless tobacco: Not on file     Comment: Smoking 3-4 cigs/day  . Alcohol Use: No  . Drug Use: No  . Sexual Activity: No   Other Topics Concern  . Not on file   Social History Narrative   Lives with daughter and two grandsons.     Review of Systems: See HPI    Objective:   Filed Vitals:   06/01/14 0954  Temp: 98.3 F (36.8 C)    Physical Exam  Constitutional: She is oriented to person, place, and time.  Cardiovascular: Normal rate, regular rhythm and normal heart sounds.   Pulmonary/Chest: Effort normal and breath sounds normal.  Musculoskeletal: She exhibits no edema.  Neurological: She is alert and oriented to person, place, and time. No cranial nerve deficit.  Skin: Skin is warm and dry.     Hirsutism Multiple cyst, blackheads on face       Lab Results  Component Value Date   WBC 5.7 03/17/2014   HGB 14.4 03/17/2014   HCT 42.7 03/17/2014   MCV 92.4 03/17/2014   PLT 272 03/17/2014   Lab Results  Component Value Date   CREATININE 0.64 03/17/2014   BUN 10 03/17/2014   NA 140 03/17/2014   K 5.0 03/17/2014   CL 105 03/17/2014   CO2 26 03/17/2014    Lab Results  Component Value Date   HGBA1C 6.2* 03/17/2014   Lipid Panel     Component Value Date/Time   CHOL 223* 03/17/2014 0922   TRIG 204* 03/17/2014 0922   HDL 56 03/17/2014 0922   CHOLHDL 4.0 03/17/2014 0922   VLDL 41* 03/17/2014 0922   LDLCALC 126* 03/17/2014 0922       Assessment and plan:   Zerina was seen today for dizziness.  Diagnoses and all orders for this visit:  Orthostatic hypotension Went over getting up slowly, increase salt for couple of days. If no improvement may need meclizine   Essential hypertension Orders: -     amLODipine  (NORVASC) 10 MG tablet; Take 1 tablet (10 mg total) by mouth daily. -     lisinopril (PRINIVIL,ZESTRIL) 20 MG tablet; Take 1 tablet (20 mg total) by mouth daily. High blood pressure Needs further evaluation, I will not make changes to regiment today.   HLD (hyperlipidemia) Orders: -    Refill lovastatin (MEVACOR) 40 MG tablet; Take 1 tablet (40 mg total) by mouth at bedtime. For Cholesterol  Smoker Smoking cessation discussed for 3 minutes, patient is not willing to quit at this time. Will continue to assess on each visit. Discussed increased risk for diseases such as cancer, heart disease, and stroke.   Dermatitis Orders: -     triamcinolone cream (KENALOG) 0.1 %; Apply 1 application topically 2 (two) times daily.   Return in about 6 months (around 12/02/2014).       Chari Manning, [facility] [phone] 06/01/2014, 10:11 AM

## 2014-06-01 NOTE — Progress Notes (Signed)
Patient has been experiencing dizziness.  She notices it when she has her head and when she raises it up. She is taking Claritin for her allergies. Patient finished taking Septra for the cysts on her face and her flank but she reports no improvement. Would like another medicine to help with that. Patient would like Rx for Clotrimazole for her hands and feet. Patient has no insurance and needs to get orange card Patient smokes about 1/2 ppd

## 2014-06-01 NOTE — Patient Instructions (Signed)

## 2014-06-22 ENCOUNTER — Ambulatory Visit: Payer: Self-pay

## 2014-07-17 ENCOUNTER — Ambulatory Visit: Payer: Self-pay

## 2014-07-27 ENCOUNTER — Ambulatory Visit (INDEPENDENT_AMBULATORY_CARE_PROVIDER_SITE_OTHER): Payer: Medicaid Other | Admitting: Sports Medicine

## 2014-07-27 ENCOUNTER — Encounter: Payer: Self-pay | Admitting: Sports Medicine

## 2014-07-27 VITALS — BP 139/91 | Ht 67.5 in | Wt 190.0 lb

## 2014-07-27 DIAGNOSIS — M542 Cervicalgia: Secondary | ICD-10-CM | POA: Diagnosis not present

## 2014-07-27 DIAGNOSIS — M25561 Pain in right knee: Secondary | ICD-10-CM

## 2014-07-27 NOTE — Progress Notes (Signed)
Subjective:     Patient ID: Alison Ford, female   DOB: [birth year], 59 y.o.   MRN: [id number]  HPI Alison Ford is a 59 yo female who presents today with right knee pain and right shoulder pain.  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 She complains of pain in her medial knee and posterior knee. She describes the pain as a constant ache. It hurts bad enough to keep her awake at night. She is unable to walk without a limp. She can no longer see her orthopedic doctor because she cannot pay her co-pay.  She has had shoulder pain and weakness for the past 4-5 years. This has been a chronic issue and she has seen many doctors over the years for this complaint. She is unable to sleep on her right side due to pain. She says that the pain is all over her shoulder and cannot localize it to one spot. The pain has remained the same over the past year. She also describes hand weakness and episodes of shaking in her arms. She denies any injuries or any specific incidences when the pain started. She has had x-rays in the past that were unrevealing.   Review of Systems Per HPI.    Objective:   Physical Exam Gen: NAD BP 139/91 mmHg  Ht 5' 7.5" (1.715 m)  Wt 190 lb (86.183 kg)  BMI 29.30 kg/m2  MSK: R Knee: Mild swelling, non-erythematous. Healed large vertical incision. Extension to 5 degrees, flexion to 110 degrees. Pain to palpation over medial aspect of knee below joint line. No pain with varus or valgus stress. No pain with patellar compression. Fullness noted along popliteal fossa.  R Shoulder: No swelling, erythema noted. Full forward flexion, struggles to reach overhead. Unable to actively raise her arm greater than 90 degress.Decreased strength with abduction, adduction, external rotation.  Myoclonus noted bilaterally with all shoulder strength testing. 2+ radial pulses.  Neuro: 2-3 beats of clonus in feet bilaterally with dorsiflexion. Good sensation to touch bilaterally in upper and lower  extremities. Reflexes: Right biceps 3+, right brachioradialis 3+, right triceps 2+.  Left biceps 2+, brachioradialis 2+, triceps 2+.  Patellar 2+ bilaterally, achilles reflexes 2+ bilaterally     Assessment:     Knee: Given patient's constant knee pain after knee replacement despite doing proper physical therapy, she warrants imaging to rule out hardware loosening.  Shoulder: Patient's clonus, generalized right shoulder weakness, and myoclonus on exam could possibly be due to an upper motor neuron lesion from spinal cord impingement. Her pain and weakness could also be due to a rotator cuff tear. She warrants further imaging to work this up.     Plan:     - MRI cervical spine and right shoulder. Pending on those results I may need to consider an MRI of her brain. - Right knee bone scan to rule out hardware loosening - Continue PT exercises for knee - F/u after tests to review results

## 2014-07-28 ENCOUNTER — Other Ambulatory Visit: Payer: Self-pay | Admitting: Internal Medicine

## 2014-08-05 ENCOUNTER — Ambulatory Visit: Payer: Medicaid Other | Admitting: Internal Medicine

## 2014-08-10 ENCOUNTER — Other Ambulatory Visit: Payer: Medicaid Other

## 2014-08-10 ENCOUNTER — Ambulatory Visit
Admission: RE | Admit: 2014-08-10 | Discharge: 2014-08-10 | Disposition: A | Discharge status: Home / Self Care | Payer: Medicaid Other | Source: Ambulatory Visit | Attending: Sports Medicine | Admitting: Sports Medicine

## 2014-08-10 DIAGNOSIS — M542 Cervicalgia: Secondary | ICD-10-CM

## 2014-08-11 ENCOUNTER — Encounter (HOSPITAL_COMMUNITY)
Admission: RE | Admit: 2014-08-11 | Discharge: 2014-08-11 | Disposition: A | Discharge status: Home / Self Care | Payer: Medicaid Other | Source: Ambulatory Visit | Attending: Sports Medicine | Admitting: Sports Medicine

## 2014-08-11 DIAGNOSIS — M25561 Pain in right knee: Secondary | ICD-10-CM | POA: Insufficient documentation

## 2014-08-11 MED ORDER — TECHNETIUM TC 99M MEDRONATE IV KIT
25.2000 | PACK | Freq: Once | INTRAVENOUS | Status: AC | PRN
  Administered 2014-08-11: 25.2 via INTRAVENOUS

## 2014-08-12 ENCOUNTER — Encounter: Payer: Self-pay | Admitting: Internal Medicine

## 2014-08-12 ENCOUNTER — Ambulatory Visit: Payer: Medicaid Other | Attending: Internal Medicine | Admitting: Internal Medicine

## 2014-08-12 VITALS — BP 145/85 | HR 71 | Temp 99.3°F | Resp 16 | Ht 67.5 in | Wt 189.0 lb

## 2014-08-12 DIAGNOSIS — H578 Other specified disorders of eye and adnexa: Secondary | ICD-10-CM | POA: Diagnosis not present

## 2014-08-12 DIAGNOSIS — L708 Other acne: Secondary | ICD-10-CM

## 2014-08-12 DIAGNOSIS — H5789 Other specified disorders of eye and adnexa: Secondary | ICD-10-CM

## 2014-08-12 DIAGNOSIS — J302 Other seasonal allergic rhinitis: Secondary | ICD-10-CM

## 2014-08-12 DIAGNOSIS — L709 Acne, unspecified: Secondary | ICD-10-CM | POA: Diagnosis not present

## 2014-08-12 DIAGNOSIS — H538 Other visual disturbances: Secondary | ICD-10-CM | POA: Insufficient documentation

## 2014-08-12 DIAGNOSIS — L680 Hirsutism: Secondary | ICD-10-CM | POA: Insufficient documentation

## 2014-08-12 DIAGNOSIS — L309 Dermatitis, unspecified: Secondary | ICD-10-CM | POA: Diagnosis not present

## 2014-08-12 DIAGNOSIS — R208 Other disturbances of skin sensation: Secondary | ICD-10-CM | POA: Diagnosis not present

## 2014-08-12 DIAGNOSIS — Z0000 Encounter for general adult medical examination without abnormal findings: Secondary | ICD-10-CM

## 2014-08-12 MED ORDER — TRIAMCINOLONE ACETONIDE 0.1 % EX CREA: TOPICAL_CREAM | CUTANEOUS | Status: DC

## 2014-08-12 MED ORDER — LORATADINE 10 MG PO TABS: 10.0000 mg | ORAL_TABLET | Freq: Every day | ORAL | Status: DC

## 2014-08-12 NOTE — Patient Instructions (Signed)
[facility] [phone]  Call back in 1 week if you have not heard back in 1 week

## 2014-08-12 NOTE — Progress Notes (Signed)
   Subjective:    Patient ID: Alison Ford, female    DOB: 09-Aug-1955, 59 y.o.   MRN: [id number]  Eye Problem  The right eye is affected. This is a new problem. The current episode started 1 to 4 weeks ago. The problem occurs constantly. The problem has been unchanged. There was no injury mechanism. There is no known exposure to pink eye. She does not wear contacts. Associated symptoms include eye redness and itching. Pertinent negatives include no recent URI. Associated symptoms comments: Burning . She has tried nothing for the symptoms.    Review of Systems  Eyes: Positive for redness.  Skin: Positive for itching and rash.      Objective:   Physical Exam  Eyes: Conjunctivae and EOM are normal. Pupils are equal, round, and reactive to light. Right eye exhibits no discharge. Left eye exhibits no discharge.  Skin:  Blackheads and large acne bumps on face Hirsutism       Assessment & Plan:  Alison Ford was seen today for eye burn.  Diagnoses and all orders for this visit:  Acne-like skin bumps Orders: -     Ambulatory referral to Dermatology  Eczema Orders: -     triamcinolone cream (KENALOG) 0.1 %; APPLY 1 APPLICATION TOPICALLY 2 TIMES DAILY.  Blurred vision, right eye Orders: -     Ambulatory referral to Ophthalmology  Burning sensation in eye Orders: See above   Seasonal allergies Orders: -    Refill loratadine (CLARITIN) 10 MG tablet; Take 1 tablet (10 mg total) by mouth daily. For allergies  Preventative health care Orders: -     Ambulatory referral to Dentistry  Return if symptoms worsen or fail to improve.  Lance Bosch, NP 08/12/2014 8:50 PM

## 2014-08-12 NOTE — Progress Notes (Signed)
Eye problems, burning,  itching and redness x 3 weeks  Medicine refill

## 2014-08-13 ENCOUNTER — Telehealth: Payer: Self-pay | Admitting: Sports Medicine

## 2014-08-13 NOTE — Telephone Encounter (Signed)
I spoke with the patient on the phone today after reviewing the MRI of her cervical spine as well as the three-phase bone scan of her right knee. The MRI of her cervical spine does show a right-sided C5-C6 disc bulge which causes moderate right foraminal narrowing. Otherwise she has mild degenerative changes of the cervical spine. The bone scan of her right knee shows abnormal activity on all 3 phases. Radiologist comments that this pattern is concerning for possible hardware loosening or infection. I have also ordered an MRI of her right shoulder but I have yet to see those results. I recommend that the patient follow-up with Dr. Noemi Chapel to discuss her right knee predicament (Dr. Noemi Chapel did her knee replacement). Although the disc bulge seen on the MRI of her cervical spine may be responsible for her right shoulder and arm pain I do want to review the MRI of her right shoulder before giving her further recommendations here. I will be sure to contact her via telephone once I have track down the results of that study.

## 2014-08-13 NOTE — Telephone Encounter (Signed)
-----   Message from Laurey Arrow, [place] sent at 08/12/2014  5:16 PM EDT ----- Regarding: results Have you seen the MRI and bone scan results for her?

## 2014-08-26 ENCOUNTER — Ambulatory Visit (INDEPENDENT_AMBULATORY_CARE_PROVIDER_SITE_OTHER): Payer: Medicaid Other | Admitting: Sports Medicine

## 2014-08-26 ENCOUNTER — Encounter: Payer: Self-pay | Admitting: Sports Medicine

## 2014-08-26 VITALS — BP 132/72 | Ht 67.5 in | Wt 190.0 lb

## 2014-08-26 DIAGNOSIS — M25561 Pain in right knee: Secondary | ICD-10-CM

## 2014-08-26 MED ORDER — TRAMADOL HCL 50 MG PO TABS: 50.0000 mg | ORAL_TABLET | Freq: Two times a day (BID) | ORAL | Status: DC | PRN

## 2014-08-26 MED ORDER — MELOXICAM 15 MG PO TABS: ORAL_TABLET | ORAL | Status: DC

## 2014-08-27 NOTE — Progress Notes (Signed)
   Subjective:    Patient ID: Alison Ford, female    DOB: Jul 06, 1955, 59 y.o.   MRN: [id number]  HPI   Patient comes in at my request to discuss MRI findings of her cervical spine as well as the bone scan results of her right total knee. The bone scan shows abnormal radio tracer activity primarily involving the femoral prosthetic. This finding is concerning for possible prosthetic loosening. The MRI of her cervical spine shows multilevel mild spondylosis but no evidence of cord compression or significant stenosis. She continues to have diffuse right shoulder pain and weakness.    Review of Systems     Objective:   Physical Exam Well-developed, well-nourished. No acute distress.  Right shoulder: Patient has limited range of motion in all planes. Global weakness. No gross deformity. Neurovascular intact distally.   Right knee exam was not repeated      Assessment & Plan:  Persistent right shoulder pain-rule out rotator cuff tear  MRI of her cervical spine does not show any evidence of cord compression or stenosis. Therefore, I would like to pursue an MRI scan of the right shoulder specifically to rule out a rotator cuff tear. She will follow-up with me after that study to review the results and delineate further treatment. In the meantime I will place her on meloxicam 15 mg daily for the next 10 days and I've given her a prescription for tramadol to take as needed. Given the abnormal bone scan of the right knee I think the patient needs to be further evaluated by orthopedics. Patient would like to be referred to [facility]. Therefore I will refer her to Dr.Xu for his opinion. Further workup and treatment here will be per his discretion.

## 2014-09-02 ENCOUNTER — Telehealth: Payer: Self-pay

## 2014-09-02 NOTE — Telephone Encounter (Signed)
Nurse called patient, reached voicemail. Left message for patient to call Vijay Durflinger with [facility], at [phone]. Nurse called patient to see which dermatology specialist she would like to go to, [facility] or [facility].

## 2014-09-04 ENCOUNTER — Inpatient Hospital Stay: Admission: RE | Admit: 2014-09-04 | Payer: Medicaid Other | Source: Ambulatory Visit

## 2014-10-01 ENCOUNTER — Telehealth: Payer: Self-pay | Admitting: Internal Medicine

## 2014-10-01 NOTE — Telephone Encounter (Signed)
Patient called and requested to speak with PCP in regards to some health concerns, patient would not specify. Please f/u with pt.

## 2014-10-05 ENCOUNTER — Telehealth: Payer: Self-pay

## 2014-10-05 NOTE — Telephone Encounter (Signed)
Returned patient phone call Patient has already resolved her issue Her medication was refilled

## 2014-10-09 ENCOUNTER — Ambulatory Visit: Payer: Medicaid Other | Attending: Internal Medicine | Admitting: Internal Medicine

## 2014-10-09 ENCOUNTER — Encounter: Payer: Self-pay | Admitting: Internal Medicine

## 2014-10-09 VITALS — BP 157/92 | HR 70 | Temp 98.0°F | Resp 16 | Ht 67.0 in | Wt 189.8 lb

## 2014-10-09 DIAGNOSIS — I10 Essential (primary) hypertension: Secondary | ICD-10-CM | POA: Insufficient documentation

## 2014-10-09 DIAGNOSIS — M25561 Pain in right knee: Secondary | ICD-10-CM | POA: Diagnosis not present

## 2014-10-09 DIAGNOSIS — R109 Unspecified abdominal pain: Secondary | ICD-10-CM | POA: Diagnosis not present

## 2014-10-09 DIAGNOSIS — M549 Dorsalgia, unspecified: Secondary | ICD-10-CM | POA: Diagnosis not present

## 2014-10-09 LAB — POCT URINALYSIS DIPSTICK
Bilirubin, UA: NEGATIVE
Glucose, UA: NEGATIVE
KETONES UA: NEGATIVE
NITRITE UA: NEGATIVE
PH UA: 6
PROTEIN UA: NEGATIVE
Spec Grav, UA: 1.01
UROBILINOGEN UA: 0.2

## 2014-10-09 MED ORDER — LISINOPRIL 20 MG PO TABS: 20.0000 mg | ORAL_TABLET | Freq: Every day | ORAL | Status: DC

## 2014-10-09 MED ORDER — NAPROXEN 500 MG PO TABS: 500.0000 mg | ORAL_TABLET | Freq: Two times a day (BID) | ORAL | Status: DC

## 2014-10-09 MED ORDER — CLOTRIMAZOLE-BETAMETHASONE 1-0.05 % EX CREA: 1.0000 "application " | TOPICAL_CREAM | Freq: Two times a day (BID) | CUTANEOUS | Status: DC

## 2014-10-09 MED ORDER — AMLODIPINE BESYLATE 10 MG PO TABS: 10.0000 mg | ORAL_TABLET | Freq: Every day | ORAL | Status: DC

## 2014-10-09 NOTE — Patient Instructions (Addendum)
Excipal Cream    Smoking Cessation Quitting smoking is important to your health and has many advantages. However, it is not always easy to quit since nicotine is a very addictive drug. Oftentimes, people try 3 times or more before being able to quit. This document explains the best ways for you to prepare to quit smoking. Quitting takes hard work and a lot of effort, but you can do it. ADVANTAGES OF QUITTING SMOKING  You will live longer, feel better, and live better.  Your body will feel the impact of quitting smoking almost immediately.  Within 20 minutes, blood pressure decreases. Your pulse returns to its normal level.  After 8 hours, carbon monoxide levels in the blood return to normal. Your oxygen level increases.  After 24 hours, the chance of having a heart attack starts to decrease. Your breath, hair, and body stop smelling like smoke.  After 48 hours, damaged nerve endings begin to recover. Your sense of taste and smell improve.  After 72 hours, the body is virtually free of nicotine. Your bronchial tubes relax and breathing becomes easier.  After 2 to 12 weeks, lungs can hold more air. Exercise becomes easier and circulation improves.  The risk of having a heart attack, stroke, cancer, or lung disease is greatly reduced.  After 1 year, the risk of coronary heart disease is cut in half.  After 5 years, the risk of stroke falls to the same as a nonsmoker.  After 10 years, the risk of lung cancer is cut in half and the risk of other cancers decreases significantly.  After 15 years, the risk of coronary heart disease drops, usually to the level of a nonsmoker.  If you are pregnant, quitting smoking will improve your chances of having a healthy baby.  The people you live with, especially any children, will be healthier.  You will have extra money to spend on things other than cigarettes. QUESTIONS TO THINK ABOUT BEFORE ATTEMPTING TO QUIT You may want to talk about your  answers with your health care provider.  Why do you want to quit?  If you tried to quit in the past, what helped and what did not?  What will be the most difficult situations for you after you quit? How will you plan to handle them?  Who can help you through the tough times? Your family? Friends? A health care provider?  What pleasures do you get from smoking? What ways can you still get pleasure if you quit? Here are some questions to ask your health care provider:  How can you help me to be successful at quitting?  What medicine do you think would be best for me and how should I take it?  What should I do if I need more help?  What is smoking withdrawal like? How can I get information on withdrawal? GET READY  Set a quit date.  Change your environment by getting rid of all cigarettes, ashtrays, matches, and lighters in your home, car, or work. Do not let people smoke in your home.  Review your past attempts to quit. Think about what worked and what did not. GET SUPPORT AND ENCOURAGEMENT You have a better chance of being successful if you have help. You can get support in many ways.  Tell your family, friends, and coworkers that you are going to quit and need their support. Ask them not to smoke around you.  Get individual, group, or telephone counseling and support. Programs are available at local  hospitals and health centers. Call your local health department for information about programs in your area.  Spiritual beliefs and practices may help some smokers quit.  Download a "quit meter" on your computer to keep track of quit statistics, such as how long you have gone without smoking, cigarettes not smoked, and money saved.  Get a self-help book about quitting smoking and staying off tobacco. [place] yourself from urges to smoke. Talk to someone, go for a walk, or occupy your time with a task.  Change your normal routine. Take a different  route to work. Drink tea instead of coffee. Eat breakfast in a different place.  Reduce your stress. Take a hot bath, exercise, or read a book.  Plan something enjoyable to do every day. Reward yourself for not smoking.  Explore interactive web-based programs that specialize in helping you quit. GET MEDICINE AND USE IT CORRECTLY Medicines can help you stop smoking and decrease the urge to smoke. Combining medicine with the above behavioral methods and support can greatly increase your chances of successfully quitting smoking.  Nicotine replacement therapy helps deliver nicotine to your body without the negative effects and risks of smoking. Nicotine replacement therapy includes nicotine gum, lozenges, inhalers, nasal sprays, and skin patches. Some may be available over-the-counter and others require a prescription.  Antidepressant medicine helps people abstain from smoking, but how this works is unknown. This medicine is available by prescription.  Nicotinic receptor partial agonist medicine simulates the effect of nicotine in your brain. This medicine is available by prescription. Ask your health care provider for advice about which medicines to use and how to use them based on your health history. Your health care provider will tell you what side effects to look out for if you choose to be on a medicine or therapy. Carefully read the information on the package. Do not use any other product containing nicotine while using a nicotine replacement product.  RELAPSE OR DIFFICULT SITUATIONS Most relapses occur within the first 3 months after quitting. Do not be discouraged if you start smoking again. Remember, most people try several times before finally quitting. You may have symptoms of withdrawal because your body is used to nicotine. You may crave cigarettes, be irritable, feel very hungry, cough often, get headaches, or have difficulty concentrating. The withdrawal symptoms are only temporary. They  are strongest when you first quit, but they will go away within 10-14 days. To reduce the chances of relapse, try to:  Avoid drinking alcohol. Drinking lowers your chances of successfully quitting.  Reduce the amount of caffeine you consume. Once you quit smoking, the amount of caffeine in your body increases and can give you symptoms, such as a rapid heartbeat, sweating, and anxiety.  Avoid smokers because they can make you want to smoke.  Do not let weight gain distract you. Many smokers will gain weight when they quit, usually less than 10 pounds. Eat a healthy diet and stay active. You can always lose the weight gained after you quit.  Find ways to improve your mood other than smoking. FOR MORE INFORMATION  www.smokefree.gov  Document Released: 12/27/2000 Document Revised: 05/19/2013 Document Reviewed: 04/13/2011 [facility] Patient Information 2015 [place], [place]. This information is not intended to replace advice given to you by your health care provider. Make sure you discuss any questions you have with your health care provider.

## 2014-10-09 NOTE — Progress Notes (Signed)
Patient requesting a referral to ortho for her bilateral knee pain Patient is also complaining of left side flank pain for the past week

## 2014-10-09 NOTE — Progress Notes (Signed)
   Subjective:    Patient ID: Alison Ford, female    DOB: 07-11-1955, 59 y.o.   MRN: [id number]  HPI  Patient is a 59 year old female here today with continuing complaints of right knee pain, related to total knee replacement  by Dr Noemi Chapel, June 2014. Patient states she was seen by Dr. Micheline Chapman and told she a has loose screw or equipment in knee.  Patient is unable to bear weight without significant pain on right side. Patient wants referral to Dr Elmyra Ricks for knee revision. Patient also complains of right side back pain  Review of Systems  Constitutional: Negative.   HENT: Negative.   Eyes: Negative.   Respiratory: Negative.   Cardiovascular: Negative.   Gastrointestinal: Negative.   Endocrine: Negative.   Genitourinary: Negative.   Musculoskeletal: Positive for back pain.       Patient complaining of sharp pain in left side exacerbated by movement. Patient currently taking meloxicam and tramadol for knee pain.  Skin: Negative.   Allergic/Immunologic: Negative.   Psychiatric/Behavioral: Negative.        Objective:   Physical Exam  Constitutional: She is oriented to person, place, and time. She appears well-developed and well-nourished.  HENT:  Head: Normocephalic and atraumatic.  Eyes: Pupils are equal, round, and reactive to light.  Neck: Normal range of motion.  Cardiovascular: Normal rate, regular rhythm, normal heart sounds and intact distal pulses.   Pulmonary/Chest: Effort normal and breath sounds normal.  Abdominal: Soft. Bowel sounds are normal.  Neurological: She is alert and oriented to person, place, and time. She has normal reflexes.  Skin: Skin is warm and dry.  Psychiatric: She has a normal mood and affect. Her behavior is normal. Judgment and thought content normal.      Assessment & Plan:    Yeslin was seen today for knee pain.  Diagnoses and all orders for this visit:  Flank pain -     Urinalysis Dipstick -     Begin naproxen (NAPROSYN) 500 MG  tablet; Take 1 tablet (500 mg total) by mouth 2 (two) times daily with a meal. -     Urine culture I feel that pain is musculoskeletal due to pain with twisting movement. Is not consistent with kidney stones or UTI. I will send urine off for culture to be sure.   Right knee pain -     Ambulatory referral to Orthopedic Surgery Patient was counseled on diet and exercise, referrals made to [facility] and Dermatology. Patient encouraged to return to clinic if symptoms worsen. Medication teaching completed patient verbalized understanding.  Essential hypertension -     amLODipine (NORVASC) 10 MG tablet; Take 1 tablet (10 mg total) by mouth daily. -     lisinopril (PRINIVIL,ZESTRIL) 20 MG tablet; Take 1 tablet (20 mg total) by mouth daily. High blood pressure Patient BP is elevated today in office today although normal at other visits. She will take medication when she gets home today. DASH diet advised.    Return if symptoms worsen or fail to improve.    Clois Dupes, RN, [place] 10/09/2014 9:59 AM

## 2014-10-11 LAB — URINE CULTURE
COLONY COUNT: NO GROWTH
Organism ID, Bacteria: NO GROWTH

## 2014-10-12 ENCOUNTER — Telehealth: Payer: Self-pay

## 2014-10-12 NOTE — Telephone Encounter (Signed)
-----   Message from Lance Bosch, NP sent at 10/12/2014  1:28 PM EDT ----- No infection in urine culture

## 2014-10-12 NOTE — Telephone Encounter (Signed)
Spoke with patient and she is aware her urine culture Came back fine-no infection

## 2014-10-19 ENCOUNTER — Ambulatory Visit: Payer: Medicaid Other | Admitting: Sports Medicine

## 2014-10-29 ENCOUNTER — Other Ambulatory Visit: Payer: Self-pay | Admitting: Orthopedic Surgery

## 2014-10-29 DIAGNOSIS — Z96659 Presence of unspecified artificial knee joint: Principal | ICD-10-CM

## 2014-10-29 DIAGNOSIS — T8484XA Pain due to internal orthopedic prosthetic devices, implants and grafts, initial encounter: Secondary | ICD-10-CM

## 2014-11-17 ENCOUNTER — Other Ambulatory Visit: Payer: Self-pay | Admitting: Sports Medicine

## 2014-11-18 ENCOUNTER — Ambulatory Visit (INDEPENDENT_AMBULATORY_CARE_PROVIDER_SITE_OTHER): Payer: Medicaid Other | Admitting: Family Medicine

## 2014-11-18 VITALS — BP 137/84 | HR 68 | Temp 98.3°F | Ht 67.5 in | Wt 189.0 lb

## 2014-11-18 DIAGNOSIS — L819 Disorder of pigmentation, unspecified: Secondary | ICD-10-CM | POA: Insufficient documentation

## 2014-11-18 DIAGNOSIS — L301 Dyshidrosis [pompholyx]: Secondary | ICD-10-CM | POA: Diagnosis not present

## 2014-11-18 DIAGNOSIS — L709 Acne, unspecified: Secondary | ICD-10-CM | POA: Insufficient documentation

## 2014-11-18 DIAGNOSIS — L723 Sebaceous cyst: Secondary | ICD-10-CM | POA: Insufficient documentation

## 2014-11-18 MED ORDER — BENZOYL PEROXIDE 4 % EX GEL: Freq: Every day | CUTANEOUS | Status: DC

## 2014-11-18 MED ORDER — TRETINOIN 0.01 % EX GEL: Freq: Every day | CUTANEOUS | Status: DC

## 2014-11-18 NOTE — Assessment & Plan Note (Signed)
Cysts are not draining, non-erythematous and are not painful at present. Recommend removing if become symptomatic or bothersome. Counseled patient that they are benign and go along with oily skin.

## 2014-11-18 NOTE — Assessment & Plan Note (Signed)
Open and closed comedones present. Rx given for Tretinoin and Benzyl Peroxide.

## 2014-11-18 NOTE — Patient Instructions (Signed)
Thanks for coming in today! It was nice to meet you!  1. Apply the Tretinoin all over the face at bedtime.  2. Apply the benzol peroxide to acne twice per day and wash off.  3. You can use the clotrimazole-betamethasone cream on your hands for flare ups of the eczema.  4. The dark spots on your legs, arms, and back are from old healed skin infections. Monitor for increase in size or change in color and let your doctor know if you notice anything.  5. The knots on your face are sebaceous cysts which are benign and not harmful. If they become red, inflamed, or start draining please see your doctor.

## 2014-11-18 NOTE — Progress Notes (Signed)
Subjective:    Alison Ford - 59 y.o. female MRN [id number]  Date of birth: 1955-10-12  HPI  Alison Ford is here for referral for multiple dermatologic complaints.   1. Intermittent Rash to Hands: -3 year hx of rash that resolves and then flares again -begins as small vesicles/plaques on palmar side of hands -skin cracks along the skin folds/joint lines of hands and subsequently peels  -admits to pruritis and associated tightness of skin  -skin does not become erythematous  -worse in the winter months  -has been prescribed triamcinolone in past with no relief, doctor gave Rx for clotrimazole-betamethasone in September and rash improved  -has tried keeping skin well moisturized with Eucerin lotion, Gold Bond, etc.    2. Hyperpigmented Lesions on legs, arms, and back: -10 year history of darkened lesions on skin that previously drained but are now well healed -have not changed in size -has not noticed any new lesions recently    3. Large Bumps on Face: -hx of multiple bumps to face, some of which have been removed and then subsequently grew back  -some of these bumps have had pus drain from them before  -they are not painful or pruritic   4. Acne: -blackheads and closed comedones present across cheeks, chin, nose and forehead  -long hx of acne  -has been given tretinoin before in the past, but no longer has Rx     Review of Systems See HPI     Objective:   Physical Exam BP 137/84 mmHg  Pulse 68  Temp(Src) 98.3 F (36.8 C) (Oral)  Ht 5' 7.5" (1.715 m)  Wt 189 lb (85.73 kg)  BMI 29.15 kg/m2 Gen: NAD, alert, cooperative with exam, well-appearing HEENT: NCAT, clear conjunctiva CV: RRR Resp: CTABL, Non-labored breathing  Skin: Oily skin of face. Blackheads and closed comedones present across face.  Hyperpigmented, keloid-like raised lesions, with defined borders: 6x5 mm and 10x5 mm on R shin, 7x33mm on L inner leg, 7x15mm with 2 small openings on left flank,  5x60mm on L upper arm  Flesh colored nodules consistent with sebaceous cysts: 5x67mm below right traguhyper, 7x47mm below left ear  Scars from previous removed sebaceous cysts present on forehead and along right jaw line.  Palmar surfaces of hands with a few small scaling papules less than 1 mm in size. Dryer skin on hands with small cracks in skin present. Hands not erythematous. Psych: good insight, alert and oriented        Assessment & Plan:   Eczema, dyshidrotic Patient's hx of small scaling plaques leading to cracking of skin with further desquamation seems consistent with dyshidrotic eczema. Further supported by symptoms worsening in colder months and improvement with the stronger betamethasone steroid. Recommend that patient use Eucerin cream instead of the lotion for moisture retention. Can continue to use clotrimazole-betamethasone for flares.   Sebaceous cyst Cysts are not draining, non-erythematous and are not painful at present. Recommend removing if become symptomatic or bothersome. Counseled patient that they are benign and go along with oily skin.   Acne Open and closed comedones present. Rx given for Tretinoin and Benzyl Peroxide.   Hyperpigmented skin lesion Appear consistent with healed folliculitis with subsequent keloid like hyperpigmented lesions of the skin.  Are consistent in color throughout, have well defined borders, and are not erythematous. Have not changed in size and have been present for several years. This is reassuring and doubt skin cancer as cause of these lesions. Instructed patient to notify  medical provider if new lesions appear or if the already present lesions become large in size, have color changes, or begin to drain.

## 2014-11-18 NOTE — Assessment & Plan Note (Signed)
Appear consistent with healed folliculitis with subsequent keloid like hyperpigmented lesions of the skin.  Are consistent in color throughout, have well defined borders, and are not erythematous. Have not changed in size and have been present for several years. This is reassuring and doubt skin cancer as cause of these lesions. Instructed patient to notify medical provider if new lesions appear or if the already present lesions become large in size, have color changes, or begin to drain.

## 2014-11-18 NOTE — Assessment & Plan Note (Signed)
Patient's hx of small scaling plaques leading to cracking of skin with further desquamation seems consistent with dyshidrotic eczema. Further supported by symptoms worsening in colder months and improvement with the stronger betamethasone steroid. Recommend that patient use Eucerin cream instead of the lotion for moisture retention. Can continue to use clotrimazole-betamethasone for flares.

## 2014-11-24 ENCOUNTER — Other Ambulatory Visit: Payer: Medicaid Other

## 2014-11-30 ENCOUNTER — Ambulatory Visit
Admission: RE | Admit: 2014-11-30 | Discharge: 2014-11-30 | Disposition: A | Discharge status: Home / Self Care | Payer: Medicaid Other | Source: Ambulatory Visit | Attending: Orthopedic Surgery | Admitting: Orthopedic Surgery

## 2014-11-30 DIAGNOSIS — T8484XA Pain due to internal orthopedic prosthetic devices, implants and grafts, initial encounter: Secondary | ICD-10-CM

## 2014-11-30 DIAGNOSIS — Z96659 Presence of unspecified artificial knee joint: Principal | ICD-10-CM

## 2015-01-06 ENCOUNTER — Emergency Department (HOSPITAL_COMMUNITY)
Admission: EM | Admit: 2015-01-06 | Discharge: 2015-01-07 | Disposition: A | Discharge status: Home / Self Care | Payer: Medicaid Other | Attending: Emergency Medicine | Admitting: Emergency Medicine

## 2015-01-06 ENCOUNTER — Encounter (HOSPITAL_COMMUNITY): Payer: Self-pay | Admitting: *Deleted

## 2015-01-06 DIAGNOSIS — J069 Acute upper respiratory infection, unspecified: Secondary | ICD-10-CM | POA: Diagnosis not present

## 2015-01-06 DIAGNOSIS — Z872 Personal history of diseases of the skin and subcutaneous tissue: Secondary | ICD-10-CM | POA: Insufficient documentation

## 2015-01-06 DIAGNOSIS — R062 Wheezing: Secondary | ICD-10-CM

## 2015-01-06 DIAGNOSIS — I10 Essential (primary) hypertension: Secondary | ICD-10-CM | POA: Insufficient documentation

## 2015-01-06 DIAGNOSIS — Z8739 Personal history of other diseases of the musculoskeletal system and connective tissue: Secondary | ICD-10-CM | POA: Diagnosis not present

## 2015-01-06 DIAGNOSIS — F17200 Nicotine dependence, unspecified, uncomplicated: Secondary | ICD-10-CM | POA: Insufficient documentation

## 2015-01-06 DIAGNOSIS — E785 Hyperlipidemia, unspecified: Secondary | ICD-10-CM | POA: Insufficient documentation

## 2015-01-06 DIAGNOSIS — Z79899 Other long term (current) drug therapy: Secondary | ICD-10-CM | POA: Diagnosis not present

## 2015-01-06 DIAGNOSIS — B9789 Other viral agents as the cause of diseases classified elsewhere: Secondary | ICD-10-CM

## 2015-01-06 DIAGNOSIS — Z880 Allergy status to penicillin: Secondary | ICD-10-CM | POA: Diagnosis not present

## 2015-01-06 DIAGNOSIS — Z791 Long term (current) use of non-steroidal anti-inflammatories (NSAID): Secondary | ICD-10-CM | POA: Diagnosis not present

## 2015-01-06 DIAGNOSIS — R011 Cardiac murmur, unspecified: Secondary | ICD-10-CM | POA: Insufficient documentation

## 2015-01-06 DIAGNOSIS — Z792 Long term (current) use of antibiotics: Secondary | ICD-10-CM | POA: Insufficient documentation

## 2015-01-06 DIAGNOSIS — R0981 Nasal congestion: Secondary | ICD-10-CM | POA: Diagnosis present

## 2015-01-06 MED ORDER — ALBUTEROL (5 MG/ML) CONTINUOUS INHALATION SOLN
10.0000 mg/h | INHALATION_SOLUTION | RESPIRATORY_TRACT | Status: DC
  Administered 2015-01-06: 10 mg/h via RESPIRATORY_TRACT
  Filled 2015-01-06: qty 20

## 2015-01-06 MED ORDER — IPRATROPIUM BROMIDE 0.02 % IN SOLN
1.0000 mg | Freq: Once | RESPIRATORY_TRACT | Status: AC
  Administered 2015-01-06: 1 mg via RESPIRATORY_TRACT
  Filled 2015-01-06: qty 5

## 2015-01-06 MED ORDER — DM-GUAIFENESIN ER 30-600 MG PO TB12
1.0000 | ORAL_TABLET | Freq: Two times a day (BID) | ORAL | Status: DC
  Administered 2015-01-06: 1 via ORAL
  Filled 2015-01-06: qty 1

## 2015-01-06 MED ORDER — BENZONATATE 100 MG PO CAPS
200.0000 mg | ORAL_CAPSULE | Freq: Once | ORAL | Status: AC
  Administered 2015-01-06: 200 mg via ORAL
  Filled 2015-01-06: qty 2

## 2015-01-06 MED ORDER — PREDNISONE 20 MG PO TABS
60.0000 mg | ORAL_TABLET | Freq: Once | ORAL | Status: AC
  Administered 2015-01-06: 60 mg via ORAL
  Filled 2015-01-06: qty 3

## 2015-01-06 NOTE — ED Provider Notes (Signed)
CSN: [id number]     Arrival date & time 01/06/15  2258 History  By signing my name below, I, Alison Ford, attest that this documentation has been prepared under the direction and in the presence of [name], MD. Electronically Signed: Irene Ford, ED Scribe. 01/06/2015. 12:41 AM.  Chief Complaint  Patient presents with  . Cough  . Nasal Congestion   The history is provided by the patient. No language interpreter was used.  HPI Comments: Alison Ford is a 59 y.o. female with a hx of HTN and heart murmur who presents to the Emergency Department complaining of productive cough with clear sputum and congestion onset two weeks ago. Pt reports associated chills, sore throat, and believes her BP is up. She states that she wants to make sure that she does not have pneumonia. She reports taking Alka Seltzer and Aleve to no relief. She denies current fever, SOB, nausea, vomiting, diarrhea, or sick contacts. Pt states that she is a smoker.   Past Medical History  Diagnosis Date  . Hyperlipidemia     takes Lovastatin nightly  . Right knee DJD   . HTN (hypertension)     takes Amlodipine daily and HCTZ   . Heart murmur     slight  . Headache(784.0)   . Weakness     left hand  . Joint pain   . Back pain     d/t knee pain  . Abnormal facial hair    Past Surgical History  Procedure Laterality Date  . Abdominal hysterectomy    . Athroscopic knee surgery      Bilateral  . Cholecystectomy    . Dilation and curettage of uterus    . Total knee arthroplasty Right 07/15/2012    Procedure: RIGHT TOTAL KNEE ARTHROPLASTY;  Surgeon: Lorn Junes, MD;  Location: [facility];  Service: Orthopedics;  Laterality: Right;   Family History  Problem Relation Age of Onset  . CAD Father 76  . CAD Mother 22  . Hypertension Mother    Social History  Substance Use Topics  . Smoking status: Current Every Day Smoker -- 0.50 packs/day  . Smokeless tobacco: Never Used     Comment: Smoking 3-4 cigs/day   . Alcohol Use: 0.0 oz/week    0 Standard drinks or equivalent per week     Comment: ocassionally   OB History    No data available     Review of Systems 10 Systems reviewed and all are negative for acute change except as noted in the HPI.  Allergies  Penicillins  Home Medications   Prior to Admission medications   Medication Sig Start Date End Date Taking? Authorizing Provider  amLODipine (NORVASC) 10 MG tablet Take 1 tablet (10 mg total) by mouth daily. 10/09/14   Lance Bosch, NP  benzoyl peroxide (BREVOXYL) 4 % gel Apply topically at bedtime. Apply topically and wash off twice per day. 11/18/14   Nicolette Bang, DO  clotrimazole-betamethasone (LOTRISONE) cream Apply 1 application topically 2 (two) times daily. 10/09/14   Lance Bosch, NP  lisinopril (PRINIVIL,ZESTRIL) 20 MG tablet Take 1 tablet (20 mg total) by mouth daily. High blood pressure 10/09/14   Lance Bosch, NP  loratadine (CLARITIN) 10 MG tablet Take 1 tablet (10 mg total) by mouth daily. For allergies 08/12/14   Lance Bosch, NP  lovastatin (MEVACOR) 40 MG tablet Take 1 tablet (40 mg total) by mouth at bedtime. For Cholesterol 06/01/14   Jannifer Rodney  Feliciana Rossetti, NP  naproxen (NAPROSYN) 500 MG tablet Take 1 tablet (500 mg total) by mouth 2 (two) times daily with a meal. 10/09/14   Lance Bosch, NP  traMADol (ULTRAM) 50 MG tablet TAKE 1 TABLET BY MOUTH EVERY 12 HOURS AS NEEDED 11/17/14   Thurman Coyer, DO  tretinoin (RETIN-A) 0.01 % gel Apply topically at bedtime. 11/18/14   Nicolette Bang, DO  triamcinolone cream (KENALOG) 0.1 % APPLY 1 APPLICATION TOPICALLY 2 TIMES DAILY. 08/12/14   Lance Bosch, NP   BP 168/109 mmHg  Pulse 92  Temp(Src) 98.3 F (36.8 C) (Oral)  Resp 20  Ht 5\' 7"  (1.702 m)  Wt 180 lb (81.647 kg)  BMI 28.19 kg/m2  SpO2 93% Physical Exam  Constitutional: She is oriented to person, place, and time. She appears well-developed and well-nourished. No distress.  HENT:  Head:  Normocephalic and atraumatic.  Nose: Nose normal.  Mouth/Throat: Oropharynx is clear and moist. No oropharyngeal exudate.  Eyes: Conjunctivae and EOM are normal. Pupils are equal, round, and reactive to light. No scleral icterus.  Neck: Normal range of motion. Neck supple. No JVD present. No tracheal deviation present. No thyromegaly present.  Cardiovascular: Normal rate, regular rhythm and normal heart sounds.  Exam reveals no gallop and no friction rub.   No murmur heard. Pulmonary/Chest: Effort normal. No respiratory distress. She has wheezes. She exhibits no tenderness.  Bilateral expiratory wheezing in all lung fields  Abdominal: Soft. Bowel sounds are normal. She exhibits no distension and no mass. There is no tenderness. There is no rebound and no guarding.  Musculoskeletal: Normal range of motion. She exhibits no edema or tenderness.  Lymphadenopathy:    She has no cervical adenopathy.  Neurological: She is alert and oriented to person, place, and time. No cranial nerve deficit. She exhibits normal muscle tone.  Skin: Skin is warm and dry. No rash noted. No erythema. No pallor.  Nursing note and vitals reviewed.   ED Course  Procedures (including critical care time) DIAGNOSTIC STUDIES: Oxygen Saturation is 93% on RA, low by my interpretation.    COORDINATION OF CARE: 11:20 PM-Discussed treatment plan which includes chest x-ray and breathing treatment with pt at bedside and pt agreed to plan.    Labs Review Labs Reviewed - No data to display  Imaging Review Dg Chest 2 View  01/07/2015  CLINICAL DATA:  Cough for 2 weeks.  Evaluate for pneumonia EXAM: CHEST  2 VIEW COMPARISON:  04/03/2012 FINDINGS: Normal heart size and aortic contours. There is central interstitial coarsening from bronchial thickening. There is no edema, consolidation, effusion, or pneumothorax. IMPRESSION: Bronchitic changes without pneumonia. Electronically Signed   By: Monte Fantasia M.D.   On: 01/07/2015  00:37   I have personally reviewed and evaluated these images and lab results as part of my medical decision-making.   EKG Interpretation None      MDM   Final diagnoses:  None    Patient presents to the ED for viral URI like symptoms for 2 weeks.  CXR negative for pneumonia.  She was given albuterol, ipratropium, prednisone for her wheezing and SOB. Upon repeat evaluation, her wheezing significantly improved and patient feels back to baseline.  Will DC with albuterol inhaler and prednisone course.  PC fu advised.  She appears well and in NAD.  VS remain within her normal limits and she is safe for DC  I personally performed the services described in this documentation, which was scribed in my presence.  The recorded information has been reviewed and is accurate.      [name], MD 01/07/15 [phone]

## 2015-01-06 NOTE — ED Notes (Signed)
Pt transported to radiology.

## 2015-01-06 NOTE — ED Notes (Signed)
Patient presents with cough for about 2 weeks and congestion.

## 2015-01-07 ENCOUNTER — Emergency Department (HOSPITAL_COMMUNITY): Payer: Medicaid Other

## 2015-01-07 MED ORDER — ALBUTEROL SULFATE HFA 108 (90 BASE) MCG/ACT IN AERS
2.0000 | INHALATION_SPRAY | Freq: Once | RESPIRATORY_TRACT | Status: AC
  Administered 2015-01-07: 2 via RESPIRATORY_TRACT
  Filled 2015-01-07: qty 6.7

## 2015-01-07 MED ORDER — PREDNISONE 20 MG PO TABS: 60.0000 mg | ORAL_TABLET | Freq: Every day | ORAL | Status: DC

## 2015-01-07 NOTE — Discharge Instructions (Signed)
Upper Respiratory Infection, Adult Alison Ford, your xray does not show pneumonia.  Use the albuterol inhaler as needed for difficulty breathing or for coughing at night.  Take steroids for the next 4 days for treatment.  See your primary care doctor within 3 days for close follow up.  If symptoms worsen, come back to the ED immediately.  Thank you. Most upper respiratory infections (URIs) are caused by a virus. A URI affects the nose, throat, and upper air passages. The most common type of URI is often called "the common cold." HOME CARE   Take medicines only as told by your doctor.  Gargle warm saltwater or take cough drops to comfort your throat as told by your doctor.  Use a warm mist humidifier or inhale steam from a shower to increase air moisture. This may make it easier to breathe.  Drink enough fluid to keep your pee (urine) clear or pale yellow.  Eat soups and other clear broths.  Have a healthy diet.  Rest as needed.  Go back to work when your fever is gone or your doctor says it is okay.  You may need to stay home longer to avoid giving your URI to others.  You can also wear a face mask and wash your hands often to prevent spread of the virus.  Use your inhaler more if you have asthma.  Do not use any tobacco products, including cigarettes, chewing tobacco, or electronic cigarettes. If you need help quitting, ask your doctor. GET HELP IF:  You are getting worse, not better.  Your symptoms are not helped by medicine.  You have chills.  You are getting more short of breath.  You have brown or red mucus.  You have yellow or brown discharge from your nose.  You have pain in your face, especially when you bend forward.  You have a fever.  You have puffy (swollen) neck glands.  You have pain while swallowing.  You have white areas in the back of your throat. GET HELP RIGHT AWAY IF:   You have very bad or constant:  Headache.  Ear pain.  Pain in your  forehead, behind your eyes, and over your cheekbones (sinus pain).  Chest pain.  You have long-lasting (chronic) lung disease and any of the following:  Wheezing.  Long-lasting cough.  Coughing up blood.  A change in your usual mucus.  You have a stiff neck.  You have changes in your:  Vision.  Hearing.  Thinking.  Mood. MAKE SURE YOU:   Understand these instructions.  Will watch your condition.  Will get help right away if you are not doing well or get worse.   This information is not intended to replace advice given to you by your health care provider. Make sure you discuss any questions you have with your health care provider.   Document Released: 06/21/2007 Document Revised: 05/19/2014 Document Reviewed: 04/09/2013 Elsevier Interactive Patient Education [employer].

## 2015-01-19 MED FILL — CROMOLYN 4% EYE DROPS: 4 | 18 days supply | Qty: 10 | Fill #0

## 2015-01-21 MED FILL — LOVASTATIN 40 MG TABLET: 40 | 30 days supply | Qty: 30 | Fill #3

## 2015-01-21 MED FILL — AMLODIPINE BESYLATE 10 MG T: 10 | 30 days supply | Qty: 30 | Fill #1

## 2015-01-21 MED FILL — LISINOPRIL 20 MG TABLET: 20 | 30 days supply | Qty: 30 | Fill #1

## 2015-01-25 ENCOUNTER — Inpatient Hospital Stay: Payer: Medicaid Other | Admitting: Internal Medicine

## 2015-02-03 ENCOUNTER — Inpatient Hospital Stay: Payer: Medicaid Other | Admitting: Internal Medicine

## 2015-02-11 ENCOUNTER — Ambulatory Visit: Payer: Medicaid Other | Attending: Internal Medicine | Admitting: Internal Medicine

## 2015-02-11 ENCOUNTER — Other Ambulatory Visit: Payer: Self-pay | Admitting: Internal Medicine

## 2015-02-11 ENCOUNTER — Encounter: Payer: Self-pay | Admitting: Internal Medicine

## 2015-02-11 VITALS — BP 132/82 | HR 73 | Temp 98.0°F | Resp 16 | Ht 68.0 in | Wt 190.6 lb

## 2015-02-11 DIAGNOSIS — Z79899 Other long term (current) drug therapy: Secondary | ICD-10-CM | POA: Insufficient documentation

## 2015-02-11 DIAGNOSIS — F17210 Nicotine dependence, cigarettes, uncomplicated: Secondary | ICD-10-CM | POA: Diagnosis not present

## 2015-02-11 DIAGNOSIS — Z880 Allergy status to penicillin: Secondary | ICD-10-CM | POA: Insufficient documentation

## 2015-02-11 DIAGNOSIS — L709 Acne, unspecified: Secondary | ICD-10-CM

## 2015-02-11 DIAGNOSIS — R05 Cough: Secondary | ICD-10-CM | POA: Diagnosis not present

## 2015-02-11 DIAGNOSIS — Z0000 Encounter for general adult medical examination without abnormal findings: Secondary | ICD-10-CM

## 2015-02-11 DIAGNOSIS — I10 Essential (primary) hypertension: Secondary | ICD-10-CM | POA: Diagnosis present

## 2015-02-11 DIAGNOSIS — E785 Hyperlipidemia, unspecified: Secondary | ICD-10-CM | POA: Insufficient documentation

## 2015-02-11 DIAGNOSIS — Z720 Tobacco use: Secondary | ICD-10-CM | POA: Diagnosis not present

## 2015-02-11 DIAGNOSIS — R059 Cough, unspecified: Secondary | ICD-10-CM

## 2015-02-11 MED ORDER — CLOTRIMAZOLE-BETAMETHASONE 1-0.05 % EX CREA: 1.0000 "application " | TOPICAL_CREAM | Freq: Two times a day (BID) | CUTANEOUS | Status: DC

## 2015-02-11 MED ORDER — NICOTINE 21 MG/24HR TD PT24
21.0000 mg | MEDICATED_PATCH | Freq: Every day | TRANSDERMAL | Status: DC
Start: 2015-02-11 — End: 2016-03-16

## 2015-02-11 MED ORDER — ALBUTEROL SULFATE HFA 108 (90 BASE) MCG/ACT IN AERS: 2.0000 | INHALATION_SPRAY | Freq: Four times a day (QID) | RESPIRATORY_TRACT | Status: DC | PRN

## 2015-02-11 NOTE — Progress Notes (Signed)
Patient ID: Alison Ford, female   DOB: April 18, 1955, 60 y.o.   MRN: [id number]  CC: cough, dermatology  HPI: Alison Ford is a 60 y.o. female here today for a follow up visit.  Patient has past medical history of heart murmur, HTN, hirsutism, and acne. Patient reports that she went to the ER last month for symptoms of cough and was told that she had a viral URI. She states that she continues to have a non-productive cough. She denies fevers, chills, nausea, vomiting, chest pain.  Patient reports that she was seen by [facility] in November but the medication she was given was not covered by her insurance. When she attempted to call the office back she was told that the provider did not work there anymore. She is requesting a new appointment with Dermatology to discuss medication changes.  Patient states that she has been a long term smoker and she is not motivated to quit. She was told that [place] has smoking cessation classes and she is interested in nicotine patches as well.   Allergies  Allergen Reactions  . Penicillins Itching and Other (See Comments)    Burning and whelps    Past Medical History  Diagnosis Date  . Hyperlipidemia     takes Lovastatin nightly  . Right knee DJD   . HTN (hypertension)     takes Amlodipine daily and HCTZ   . Heart murmur     slight  . Headache(784.0)   . Weakness     left hand  . Joint pain   . Back pain     d/t knee pain  . Abnormal facial hair    Current Outpatient Prescriptions on File Prior to Visit  Medication Sig Dispense Refill  . amLODipine (NORVASC) 10 MG tablet Take 1 tablet (10 mg total) by mouth daily. 30 tablet 5  . lisinopril (PRINIVIL,ZESTRIL) 20 MG tablet Take 20 mg by mouth daily.    Marland Kitchen lovastatin (MEVACOR) 40 MG tablet Take 1 tablet (40 mg total) by mouth at bedtime. For Cholesterol 30 tablet 5  . benzoyl peroxide (BREVOXYL) 4 % gel Apply topically at bedtime. Apply topically and wash off twice  per day. 42.5 g 0  . predniSONE (DELTASONE) 20 MG tablet Take 3 tablets (60 mg total) by mouth daily. (Patient not taking: Reported on 02/11/2015) 12 tablet 0  . tretinoin (RETIN-A) 0.01 % gel Apply topically at bedtime. (Patient not taking: Reported on 02/11/2015) 45 g 0  . triamcinolone cream (KENALOG) 0.1 % APPLY 1 APPLICATION TOPICALLY 2 TIMES DAILY. 30 g 0   No current facility-administered medications on file prior to visit.   Family History  Problem Relation Age of Onset  . CAD Father 31  . CAD Mother 74  . Hypertension Mother    Social History   Social History  . Marital Status: Legally Separated    Spouse Name: N/A  . Number of Children: 3  . Years of Education: N/A   Occupational History  .  [employer]   Social History Main Topics  . Smoking status: Current Every Day Smoker -- 0.50 packs/day  . Smokeless tobacco: Never Used     Comment: Smoking 3-4 cigs/day  . Alcohol Use: 0.0 oz/week    0 Standard drinks or equivalent per week     Comment: ocassionally  . Drug Use: No  . Sexual Activity: No   Other Topics Concern  . Not on file   Social History Narrative  Lives with daughter and two grandsons.     Review of Systems: Other than what is stated in HPI, all other systems are negative.   Objective:   Filed Vitals:   02/11/15 1130  BP: 132/82  Pulse: 73  Temp: 98 F (36.7 C)  Resp: 16    Physical Exam  Constitutional: She is oriented to person, place, and time.  Cardiovascular: Normal rate, regular rhythm and normal heart sounds.   Pulmonary/Chest: Effort normal and breath sounds normal. She has no wheezes.  Neurological: She is alert and oriented to person, place, and time.  Skin:  acne  Psychiatric: She has a normal mood and affect.     Lab Results  Component Value Date   WBC 5.7 03/17/2014   HGB 14.4 03/17/2014   HCT 42.7 03/17/2014   MCV 92.4 03/17/2014   PLT 272 03/17/2014   Lab Results  Component Value Date   CREATININE 0.64  03/17/2014   BUN 10 03/17/2014   NA 140 03/17/2014   K 5.0 03/17/2014   CL 105 03/17/2014   CO2 26 03/17/2014    Lab Results  Component Value Date   HGBA1C 6.2* 03/17/2014   Lipid Panel     Component Value Date/Time   CHOL 223* 03/17/2014 0922   TRIG 204* 03/17/2014 0922   HDL 56 03/17/2014 0922   CHOLHDL 4.0 03/17/2014 0922   VLDL 41* 03/17/2014 0922   LDLCALC 126* 03/17/2014 0922       Assessment and plan:   Alison Ford was seen today for follow-up.  Diagnoses and all orders for this visit:  Acne, unspecified acne type -     clotrimazole-betamethasone (LOTRISONE) cream; Apply 1 application topically 2 (two) times daily.  Cough -    Refilled albuterol (PROVENTIL HFA;VENTOLIN HFA) 108 (90 Base) MCG/ACT inhaler; Inhale 2 puffs into the lungs every 6 (six) hours as needed for wheezing or shortness of breath. Stressed smoking cessation.  Tobacco use -   Begin nicotine (NICODERM CQ - DOSED IN MG/24 HOURS) 21 mg/24hr patch; Place 1 patch (21 mg total) onto the skin daily. I have congratulated patient on taking the first steps. I have given her info to free smoking cessation classes and to the quit line number. Went over long term complications  Health care maintenance -     Flu Vaccine QUAD 36+ mos PF IM (Fluarix & Fluzone Quad PF)   Return if symptoms worsen or fail to improve.       Lance Bosch, [facility] [phone] 02/11/2015, 12:46 PM

## 2015-02-11 NOTE — Patient Instructions (Signed)
quitline is a quit smoking coaching service available through the toll-free telephone number, 1-800-QUIT-NOW 306-790-5336)

## 2015-02-11 NOTE — Progress Notes (Signed)
Patient here for follow up from the ED for her cough and congestion Patient also requesting a referral to the dermatologist

## 2015-05-12 ENCOUNTER — Other Ambulatory Visit: Payer: Self-pay | Admitting: Internal Medicine

## 2015-06-17 ENCOUNTER — Other Ambulatory Visit: Payer: Self-pay | Admitting: Internal Medicine

## 2015-06-17 MED FILL — LISINOPRIL 20 MG TABLET: 20 | 30 days supply | Qty: 30 | Fill #2

## 2015-06-17 MED FILL — AMLODIPINE BESYLATE 10 MG T: 10 | 30 days supply | Qty: 30 | Fill #2

## 2015-06-17 MED FILL — CROMOLYN 4% EYE DROPS: 4 | 18 days supply | Qty: 10 | Fill #1

## 2015-06-18 ENCOUNTER — Other Ambulatory Visit: Payer: Self-pay | Admitting: Internal Medicine

## 2015-07-28 ENCOUNTER — Other Ambulatory Visit: Payer: Self-pay | Admitting: *Deleted

## 2015-07-28 MED ORDER — TRAMADOL HCL 50 MG PO TABS: 50.0000 mg | ORAL_TABLET | Freq: Two times a day (BID) | ORAL | Status: DC | PRN

## 2015-07-28 MED FILL — LOVASTATIN 40 MG TABLET: 40 | 30 days supply | Qty: 30 | Fill #0

## 2015-07-28 MED FILL — traMADol HCL 50 MG TABS: 50 | 30 days supply | Qty: 60 | Fill #0

## 2015-08-10 ENCOUNTER — Other Ambulatory Visit: Payer: Self-pay | Admitting: Internal Medicine

## 2015-08-10 DIAGNOSIS — Z1231 Encounter for screening mammogram for malignant neoplasm of breast: Secondary | ICD-10-CM

## 2015-08-13 ENCOUNTER — Ambulatory Visit
Admission: RE | Admit: 2015-08-13 | Discharge: 2015-08-13 | Disposition: A | Discharge status: Home / Self Care | Payer: Medicaid Other | Source: Ambulatory Visit | Attending: Internal Medicine | Admitting: Internal Medicine

## 2015-08-13 DIAGNOSIS — Z1231 Encounter for screening mammogram for malignant neoplasm of breast: Secondary | ICD-10-CM

## 2015-08-16 ENCOUNTER — Other Ambulatory Visit: Payer: Self-pay | Admitting: Internal Medicine

## 2015-08-16 DIAGNOSIS — R928 Other abnormal and inconclusive findings on diagnostic imaging of breast: Secondary | ICD-10-CM

## 2015-08-17 ENCOUNTER — Other Ambulatory Visit: Payer: Self-pay | Admitting: Internal Medicine

## 2015-08-17 DIAGNOSIS — R928 Other abnormal and inconclusive findings on diagnostic imaging of breast: Secondary | ICD-10-CM

## 2015-08-20 ENCOUNTER — Ambulatory Visit
Admission: RE | Admit: 2015-08-20 | Discharge: 2015-08-20 | Disposition: A | Discharge status: Home / Self Care | Payer: Medicaid Other | Source: Ambulatory Visit | Attending: Internal Medicine | Admitting: Internal Medicine

## 2015-08-20 DIAGNOSIS — R928 Other abnormal and inconclusive findings on diagnostic imaging of breast: Secondary | ICD-10-CM

## 2015-08-23 ENCOUNTER — Other Ambulatory Visit: Payer: Self-pay | Admitting: Internal Medicine

## 2015-08-25 ENCOUNTER — Telehealth: Payer: Self-pay | Admitting: *Deleted

## 2015-08-25 NOTE — Telephone Encounter (Signed)
Medical Assistant left message on patient's home and cell voicemail. Voicemail states to give a call back to Conya Ellinwood with [facility] at [phone].  

## 2015-08-25 NOTE — Telephone Encounter (Signed)
-----   Message from Tresa Garter, MD sent at 08/20/2015  5:07 PM EDT ----- Please inform patient that her screening mammogram shows no evidence of malignancy. Recommend screening mammogram in one year

## 2015-08-25 NOTE — Telephone Encounter (Signed)
error 

## 2015-09-29 ENCOUNTER — Encounter: Payer: Self-pay | Admitting: Internal Medicine

## 2015-09-29 ENCOUNTER — Telehealth: Payer: Self-pay | Admitting: Internal Medicine

## 2015-09-29 ENCOUNTER — Ambulatory Visit: Payer: Medicaid Other | Attending: Internal Medicine | Admitting: Internal Medicine

## 2015-09-29 ENCOUNTER — Other Ambulatory Visit: Payer: Self-pay

## 2015-09-29 VITALS — BP 136/77 | HR 69 | Temp 98.3°F | Resp 17 | Ht 67.0 in | Wt 183.0 lb

## 2015-09-29 DIAGNOSIS — J449 Chronic obstructive pulmonary disease, unspecified: Secondary | ICD-10-CM | POA: Diagnosis not present

## 2015-09-29 DIAGNOSIS — F17200 Nicotine dependence, unspecified, uncomplicated: Secondary | ICD-10-CM

## 2015-09-29 DIAGNOSIS — R05 Cough: Secondary | ICD-10-CM | POA: Diagnosis not present

## 2015-09-29 DIAGNOSIS — Z1211 Encounter for screening for malignant neoplasm of colon: Secondary | ICD-10-CM

## 2015-09-29 DIAGNOSIS — R059 Cough, unspecified: Secondary | ICD-10-CM

## 2015-09-29 DIAGNOSIS — Z23 Encounter for immunization: Secondary | ICD-10-CM | POA: Insufficient documentation

## 2015-09-29 DIAGNOSIS — E2839 Other primary ovarian failure: Secondary | ICD-10-CM

## 2015-09-29 DIAGNOSIS — R011 Cardiac murmur, unspecified: Secondary | ICD-10-CM | POA: Diagnosis not present

## 2015-09-29 DIAGNOSIS — Z8249 Family history of ischemic heart disease and other diseases of the circulatory system: Secondary | ICD-10-CM | POA: Diagnosis not present

## 2015-09-29 DIAGNOSIS — R7303 Prediabetes: Secondary | ICD-10-CM | POA: Insufficient documentation

## 2015-09-29 DIAGNOSIS — Z113 Encounter for screening for infections with a predominantly sexual mode of transmission: Secondary | ICD-10-CM | POA: Diagnosis not present

## 2015-09-29 DIAGNOSIS — Z1329 Encounter for screening for other suspected endocrine disorder: Secondary | ICD-10-CM | POA: Insufficient documentation

## 2015-09-29 DIAGNOSIS — Z880 Allergy status to penicillin: Secondary | ICD-10-CM | POA: Insufficient documentation

## 2015-09-29 DIAGNOSIS — Z79899 Other long term (current) drug therapy: Secondary | ICD-10-CM | POA: Diagnosis not present

## 2015-09-29 DIAGNOSIS — E785 Hyperlipidemia, unspecified: Secondary | ICD-10-CM | POA: Diagnosis not present

## 2015-09-29 DIAGNOSIS — Z01818 Encounter for other preprocedural examination: Secondary | ICD-10-CM | POA: Insufficient documentation

## 2015-09-29 DIAGNOSIS — F17210 Nicotine dependence, cigarettes, uncomplicated: Secondary | ICD-10-CM | POA: Diagnosis not present

## 2015-09-29 DIAGNOSIS — R062 Wheezing: Secondary | ICD-10-CM | POA: Insufficient documentation

## 2015-09-29 DIAGNOSIS — I10 Essential (primary) hypertension: Secondary | ICD-10-CM | POA: Diagnosis not present

## 2015-09-29 LAB — POCT CBG (FASTING - GLUCOSE)-MANUAL ENTRY: Glucose Fasting, POC: 121 mg/dL — AB (ref 70–99)

## 2015-09-29 LAB — POCT GLYCOSYLATED HEMOGLOBIN (HGB A1C): HEMOGLOBIN A1C: 5.5

## 2015-09-29 MED ORDER — FLUTICASONE-SALMETEROL 100-50 MCG/DOSE IN AEPB: 1.0000 | INHALATION_SPRAY | Freq: Two times a day (BID) | RESPIRATORY_TRACT | 3 refills | Status: DC

## 2015-09-29 MED ORDER — ALBUTEROL SULFATE HFA 108 (90 BASE) MCG/ACT IN AERS: 2.0000 | INHALATION_SPRAY | Freq: Four times a day (QID) | RESPIRATORY_TRACT | 11 refills | Status: DC | PRN

## 2015-09-29 MED ORDER — AMLODIPINE BESYLATE 10 MG PO TABS: 10.0000 mg | ORAL_TABLET | Freq: Every day | ORAL | 3 refills | Status: DC

## 2015-09-29 MED ORDER — LOVASTATIN 40 MG PO TABS: 40.0000 mg | ORAL_TABLET | Freq: Every day | ORAL | 3 refills | Status: DC

## 2015-09-29 MED ORDER — LISINOPRIL 20 MG PO TABS: 20.0000 mg | ORAL_TABLET | Freq: Every day | ORAL | 2 refills | Status: DC

## 2015-09-29 NOTE — Progress Notes (Signed)
Alison Ford, is a 60 y.o. female  [id number]  [id number]  DOB - 1955/01/29  CC: No chief complaint on file.      HPI: Jonell Bargiel is a 60 y.o. female here today to establish medical care., last seen in clinic 1/17, w/ significant hx of heart murmur with no diagnostic echocardiogram in our system, hypertension, tob abuse, hirsutism, acne, as well as a degenerative joint disease of the right knee pending right knee surgery.  Pt states she is seeing Dr Izell Remy (?spell) ortho, who would like to do repair of her right knee, but she needs surgical clearance first.  Pt still smokes 1/3 ppd.  Tries to walk as able.  Gets occasionally wheezing sometimes, and the inhalers help (she is out of it).  She has had no formal PFTs for copd dx.  Has pending deep teeth cleaning w/ dentist tomorrow, was given rx for clindamycin to start today.  Patient has No headache, No chest pain, No abdominal pain - No Nausea, No new weakness tingling or numbness, No Cough - SOB.  Denies orthopnea/syncope/pnd/angina sx/palpitations.  METS >4  Review of Systems: Per HPI, o/w all systems reviewed and negative.  Allergies  Allergen Reactions  . Penicillins Itching and Other (See Comments)    Burning and whelps    Past Medical History:  Diagnosis Date  . Abnormal facial hair   . Back pain    d/t knee pain  . Headache(784.0)   . Heart murmur    slight  . HTN (hypertension)    takes Amlodipine daily and HCTZ   . Hyperlipidemia    takes Lovastatin nightly  . Joint pain   . Right knee DJD   . Weakness    left hand   Current Outpatient Prescriptions on File Prior to Visit  Medication Sig Dispense Refill  . benzoyl peroxide (BREVOXYL) 4 % gel Apply topically at bedtime. Apply topically and wash off twice per day. 42.5 g 0  . traMADol (ULTRAM) 50 MG tablet Take 1 tablet (50 mg total) by mouth every 12 (twelve) hours as needed. 60 tablet 0  . tretinoin (RETIN-A) 0.01 % gel Apply topically at  bedtime. 45 g 0  . triamcinolone cream (KENALOG) 0.1 % APPLY 1 APPLICATION TOPICALLY 2 TIMES DAILY. 30 g 0  . clotrimazole-betamethasone (LOTRISONE) cream Needs office visit for refills (Patient not taking: Reported on 09/29/2015) 30 g 0  . nicotine (NICODERM CQ - DOSED IN MG/24 HOURS) 21 mg/24hr patch Place 1 patch (21 mg total) onto the skin daily. (Patient not taking: Reported on 09/29/2015) 28 patch 0  . predniSONE (DELTASONE) 20 MG tablet Take 3 tablets (60 mg total) by mouth daily. (Patient not taking: Reported on 09/29/2015) 12 tablet 0   No current facility-administered medications on file prior to visit.    Family History  Problem Relation Age of Onset  . CAD Father 56  . CAD Mother 4  . Hypertension Mother    Social History   Social History  . Marital status: Legally Separated    Spouse name: N/A  . Number of children: 3  . Years of education: N/A   Occupational History  .  [employer]   Social History Main Topics  . Smoking status: Current Every Day Smoker    Packs/day: 0.50  . Smokeless tobacco: Never Used     Comment: Smoking 3-4 cigs/day  . Alcohol use 0.0 oz/week     Comment: ocassionally  . Drug use: No  . Sexual activity:  No   Other Topics Concern  . Not on file   Social History Narrative   Lives with daughter and two grandsons.     Objective:   Vitals:   09/29/15 1103  BP: 136/77  Pulse: 69  Resp: 17  Temp: 98.3 F (36.8 C)    Filed Weights   09/29/15 1103  Weight: 183 lb (83 kg)    BP Readings from Last 3 Encounters:  09/29/15 136/77  02/11/15 132/82  01/07/15 154/70    Physical Exam: Constitutional: Patient appears well-developed and well-nourished. No distress. AAOx3, pleasant.  Able to get up on examining table w/o difficulty. HENT: Normocephalic, atraumatic, External right and left ear normal. Oropharynx is clear and moist.  bilat Tms clear. Facial hair Eyes: Conjunctivae and EOM are normal. PERRL, no scleral icterus. Neck: Normal  ROM. Neck supple. No JVD.  CVS: RRR, S1/S2 +,  2/6 sem precordium,  no gallops, no carotid bruit.  Pulmonary: Effort and breath sounds normal, no stridor, rhonchi, wheezes, rales.  Abdominal: Soft. BS +, obese, no distension, tenderness, rebound or guarding.  Musculoskeletal: Normal range of motion. No edema and no tenderness.  LE: bilat/ no c/c/e, pulses 2+ bilateral. Neuro: Alert.  muscle tone coordination wnl. No cranial nerve deficit grossly. Skin: Skin is warm and dry. No rash noted. Not diaphoretic. No erythema. No pallor. Psychiatric: Normal mood and affect. Behavior, judgment, thought content normal.  Lab Results  Component Value Date   WBC 5.7 03/17/2014   HGB 14.4 03/17/2014   HCT 42.7 03/17/2014   MCV 92.4 03/17/2014   PLT 272 03/17/2014   Lab Results  Component Value Date   CREATININE 0.64 03/17/2014   BUN 10 03/17/2014   NA 140 03/17/2014   K 5.0 03/17/2014   CL 105 03/17/2014   CO2 26 03/17/2014    Lab Results  Component Value Date   HGBA1C 5.5 09/29/2015   Lipid Panel     Component Value Date/Time   CHOL 223 (H) 03/17/2014 0922   TRIG 204 (H) 03/17/2014 0922   HDL 56 03/17/2014 0922   CHOLHDL 4.0 03/17/2014 0922   VLDL 41 (H) 03/17/2014 0922   LDLCALC 126 (H) 03/17/2014 0922       Depression screen PHQ 2/9 09/29/2015 11/18/2014 08/26/2014 08/12/2014 07/27/2014  Decreased Interest 2 0 0 0 0  Down, Depressed, Hopeless 0 0 0 0 0  PHQ - 2 Score 2 0 0 0 0  Altered sleeping 2 - - - -  Tired, decreased energy 2 - - - -  Change in appetite 0 - - - -  Feeling bad or failure about yourself  0 - - - -  Trouble concentrating 0 - - - -  Moving slowly or fidgety/restless 0 - - - -  Suicidal thoughts 0 - - - -  PHQ-9 Score 6 - - - -  Difficult doing work/chores Not difficult at all - - - -   ekg 09/29/15 [id number]: lad, 64bpm, no acute STEMI, possible criteria for LVH.  Assessment and plan:   1. Essential hypertension Controlled, renewed rx. - amLODipine  (NORVASC) 10 MG tablet; Take 1 tablet (10 mg total) by mouth daily.  Dispense: 90 tablet; Refill: 3 - BASIC METABOLIC PANEL WITH GFR  2. Cough/wheezing intermittent, none today, suspected associated to smoking and possible copd - albuterol (PROVENTIL HFA;VENTOLIN HFA) 108 (90 Base) MCG/ACT inhaler; Inhale 2 puffs into the lungs every 6 (six) hours as needed for wheezing or shortness of breath.  Dispense: 1 Inhaler; Refill: 11  3. COPD suggested by initial evaluation ([place]) No hx of pfts in past.  4. Colon cancer screening - Ambulatory referral to Gastroenterology  5. Flu vaccine need - Flu Vaccine QUAD 36+ mos PF IM (Fluarix & Fluzone Quad PF)  6. Heart murmur, systolic - ECHOCARDIOGRAM COMPLETE; Future  7. Preoperative clearance for right knee surgery - asked pt to get info on surgeon so we can send preop ppwk. - METS >4, no anginal c/o of other worisome features, will chk echo due to heart murmur - ECHOCARDIOGRAM COMPLETE; Future - EKG 12-Lead - no acute findings, but noted signs of possible lvh.  8. Prediabetes - POCT CBG (Fasting - Glucose) - HgB A1c (6.2 - 3/16) - CBC with Differential  9. Thyroid disorder screen - TSH  10. Estrogen deficiency - Vitamin D, 25-hydroxy  11. Smoking addiction Recd total  Cessation, esp if considering ortho knee replacement for wound healing. Tips provided  12. Cavities For tooth cleaning tomorw w/ preop cleocin rx, encouraged to eat probiotics/activia yogurt bid to prevent diarrhea.  Return in about 3 months (around 12/29/2015).  The patient was given clear instructions to go to ER or return to medical center if symptoms don't improve, worsen or new problems develop. The patient verbalized understanding. The patient was told to call to get lab results if they haven't heard anything in the next week.    This note has been created with Surveyor, quantity. Any transcriptional errors are  unintentional.   Maren Reamer, MD, [place], [place]   09/29/2015, 12:26 PM

## 2015-09-29 NOTE — Telephone Encounter (Signed)
Patient verified DOB  Patient will come before 1 pm to get lab work drawn.   Patient understood that office is closed from 1pm-2pm and could not be seen between those times.   No further questions at this time.

## 2015-09-29 NOTE — Progress Notes (Signed)
Chronic knee problems Throat and ear bothering her   Pt states Albuterol needs to be ordered and needs refills on other medications   Patient wants flu shot ( not sure if allowed since she is going to dentist tomorrow)

## 2015-09-29 NOTE — Patient Instructions (Addendum)
Influenza Virus Vaccine injection (Fluarix) What is this medicine? INFLUENZA VIRUS VACCINE (in floo EN zuh VAHY ruhs vak SEEN) helps to reduce the risk of getting influenza also known as the flu. This medicine may be used for other purposes; ask your health care provider or pharmacist if you have questions. What should I tell my health care provider before I take this medicine? They need to know if you have any of these conditions: -bleeding disorder like hemophilia -fever or infection -Guillain-Barre syndrome or other neurological problems -immune system problems -infection with the human immunodeficiency virus (HIV) or AIDS -low blood platelet counts -multiple sclerosis -an unusual or allergic reaction to influenza virus vaccine, eggs, chicken proteins, latex, gentamicin, other medicines, foods, dyes or preservatives -pregnant or trying to get pregnant -breast-feeding How should I use this medicine? This vaccine is for injection into a muscle. It is given by a health care professional. A copy of Vaccine Information Statements will be given before each vaccination. Read this sheet carefully each time. The sheet may change frequently. Talk to your pediatrician regarding the use of this medicine in children. Special care may be needed. Overdosage: If you think you have taken too much of this medicine contact a poison control center or emergency room at once. NOTE: This medicine is only for you. Do not share this medicine with others. What if I miss a dose? This does not apply. What may interact with this medicine? -chemotherapy or radiation therapy -medicines that lower your immune system like etanercept, anakinra, infliximab, and adalimumab -medicines that treat or prevent blood clots like warfarin -phenytoin -steroid medicines like prednisone or cortisone -theophylline -vaccines This list may not describe all possible interactions. Give your health care provider a list of all the  medicines, herbs, non-prescription drugs, or dietary supplements you use. Also tell them if you smoke, drink alcohol, or use illegal drugs. Some items may interact with your medicine. What should I watch for while using this medicine? Report any side effects that do not go away within 3 days to your doctor or health care professional. Call your health care provider if any unusual symptoms occur within 6 weeks of receiving this vaccine. You may still catch the flu, but the illness is not usually as bad. You cannot get the flu from the vaccine. The vaccine will not protect against colds or other illnesses that may cause fever. The vaccine is needed every year. What side effects may I notice from receiving this medicine? Side effects that you should report to your doctor or health care professional as soon as possible: -allergic reactions like skin rash, itching or hives, swelling of the face, lips, or tongue Side effects that usually do not require medical attention (report to your doctor or health care professional if they continue or are bothersome): -fever -headache -muscle aches and pains -pain, tenderness, redness, or swelling at site where injected -weak or tired This list may not describe all possible side effects. Call your doctor for medical advice about side effects. You may report side effects to FDA at 1-800-FDA-1088. Where should I keep my medicine? This vaccine is only given in a clinic, pharmacy, doctor's office, or other health care setting and will not be stored at home. NOTE: This sheet is a summary. It may not cover all possible information. If you have questions about this medicine, talk to your doctor, pharmacist, or health care provider.    2016, Elsevier/Gold Standard. (2007-07-31 09:30:40)   -  Hypertension Hypertension is another  name for high blood pressure. High blood pressure forces your heart to work harder to pump blood. A blood pressure reading has two numbers,  which includes a higher number over a lower number (example: 110/72). HOME CARE   Have your blood pressure rechecked by your doctor.  Only take medicine as told by your doctor. Follow the directions carefully. The medicine does not work as well if you skip doses. Skipping doses also puts you at risk for problems.  Do not smoke.  Monitor your blood pressure at home as told by your doctor. GET HELP IF:  You think you are having a reaction to the medicine you are taking.  You have repeat headaches or feel dizzy.  You have puffiness (swelling) in your ankles.  You have trouble with your vision. GET HELP RIGHT AWAY IF:   You get a very bad headache and are confused.  You feel weak, numb, or faint.  You get chest or belly (abdominal) pain.  You throw up (vomit).  You cannot breathe very well. MAKE SURE YOU:   Understand these instructions.  Will watch your condition.  Will get help right away if you are not doing well or get worse.   This information is not intended to replace advice given to you by your health care provider. Make sure you discuss any questions you have with your health care provider.   Document Released: 06/21/2007 Document Revised: 01/07/2013 Document Reviewed: 10/25/2012 Elsevier Interactive Patient Education 2016 [place] DASH stands for "Dietary Approaches to Stop Hypertension." The DASH eating plan is a healthy eating plan that has been shown to reduce high blood pressure (hypertension). Additional health benefits may include reducing the risk of type 2 diabetes mellitus, heart disease, and stroke. The DASH eating plan may also help with weight loss. WHAT DO I NEED TO KNOW ABOUT THE DASH EATING PLAN? For the DASH eating plan, you will follow these general guidelines: Choose foods with a percent daily value for sodium of less than 5% (as listed on the food label). Use salt-free seasonings or herbs instead of table salt or sea  salt. Check with your health care provider or pharmacist before using salt substitutes. Eat lower-sodium products, often labeled as "lower sodium" or "no salt added." Eat fresh foods. Eat more vegetables, fruits, and low-fat dairy products. Choose whole grains. Look for the word "whole" as the first word in the ingredient list. Choose fish and skinless chicken or [place] more often than red meat. Limit fish, poultry, and meat to 6 oz (170 g) each day. Limit sweets, desserts, sugars, and sugary drinks. Choose heart-healthy fats. Limit cheese to 1 oz (28 g) per day. Eat more home-cooked food and less restaurant, buffet, and fast food. Limit fried foods. Cook foods using methods other than frying. Limit canned vegetables. If you do use them, rinse them well to decrease the sodium. When eating at a restaurant, ask that your food be prepared with less salt, or no salt if possible. WHAT FOODS CAN I EAT? Seek help from a dietitian for individual calorie needs. Grains Whole grain or whole wheat bread. Brown rice. Whole grain or whole wheat pasta. Quinoa, bulgur, and whole grain cereals. Low-sodium cereals. Corn or whole wheat flour tortillas. Whole grain cornbread. Whole grain crackers. Low-sodium crackers. Vegetables Fresh or frozen vegetables (raw, steamed, roasted, or grilled). Low-sodium or reduced-sodium tomato and vegetable juices. Low-sodium or reduced-sodium tomato sauce and paste. Low-sodium or reduced-sodium canned vegetables.  Fruits  All fresh, canned (in natural juice), or frozen fruits. Meat and Other Protein Products Ground beef (85% or leaner), grass-fed beef, or beef trimmed of fat. Skinless chicken or [place]. Ground chicken or [place]. Pork trimmed of fat. All fish and seafood. Eggs. Dried beans, peas, or lentils. Unsalted nuts and seeds. Unsalted canned beans. Dairy Low-fat dairy products, such as skim or 1% milk, 2% or reduced-fat cheeses, low-fat ricotta or cottage cheese, or  plain low-fat yogurt. Low-sodium or reduced-sodium cheeses. Fats and Oils Tub margarines without trans fats. Light or reduced-fat mayonnaise and salad dressings (reduced sodium). Avocado. Safflower, olive, or canola oils. Natural peanut or almond butter. Other Unsalted popcorn and pretzels. The items listed above may not be a complete list of recommended foods or beverages. Contact your dietitian for more options. WHAT FOODS ARE NOT RECOMMENDED? Grains White bread. White pasta. White rice. Refined cornbread. Bagels and croissants. Crackers that contain trans fat. Vegetables Creamed or fried vegetables. Vegetables in a cheese sauce. Regular canned vegetables. Regular canned tomato sauce and paste. Regular tomato and vegetable juices. Fruits Dried fruits. Canned fruit in light or heavy syrup. Fruit juice. Meat and Other Protein Products Fatty cuts of meat. Ribs, chicken wings, bacon, sausage, bologna, salami, chitterlings, fatback, hot dogs, bratwurst, and packaged luncheon meats. Salted nuts and seeds. Canned beans with salt. Dairy Whole or 2% milk, cream, half-and-half, and cream cheese. Whole-fat or sweetened yogurt. Full-fat cheeses or blue cheese. Nondairy creamers and whipped toppings. Processed cheese, cheese spreads, or cheese curds. Condiments Onion and garlic salt, seasoned salt, table salt, and sea salt. Canned and packaged gravies. Worcestershire sauce. Tartar sauce. Barbecue sauce. Teriyaki sauce. Soy sauce, including reduced sodium. Steak sauce. Fish sauce. Oyster sauce. Cocktail sauce. Horseradish. Ketchup and mustard. Meat flavorings and tenderizers. Bouillon cubes. Hot sauce. Tabasco sauce. Marinades. Taco seasonings. Relishes. Fats and Oils Butter, stick margarine, lard, shortening, ghee, and bacon fat. Coconut, palm kernel, or palm oils. Regular salad dressings. Other Pickles and olives. Salted popcorn and pretzels. The items listed above may not be a complete list of foods  and beverages to avoid. Contact your dietitian for more information. WHERE CAN I FIND MORE INFORMATION? National Heart, Lung, and Blood Institute: travelstabloid.com   This information is not intended to replace advice given to you by your health care provider. Make sure you discuss any questions you have with your health care provider.   Document Released: 12/22/2010 Document Revised: 01/23/2014 Document Reviewed: 11/06/2012 Elsevier Interactive Patient Education 2016 Elsevier Inc. - Smoking Cessation, Tips for Success If you are ready to quit smoking, congratulations! You have chosen to help yourself be healthier. Cigarettes bring nicotine, tar, carbon monoxide, and other irritants into your body. Your lungs, heart, and blood vessels will be able to work better without these poisons. There are many different ways to quit smoking. Nicotine gum, nicotine patches, a nicotine inhaler, or nicotine nasal spray can help with physical craving. Hypnosis, support groups, and medicines help break the habit of smoking. WHAT THINGS CAN I DO TO MAKE QUITTING EASIER?  Here are some tips to help you quit for good:  Pick a date when you will quit smoking completely. Tell all of your friends and family about your plan to quit on that date.  Do not try to slowly cut down on the number of cigarettes you are smoking. Pick a quit date and quit smoking completely starting on that day.  Throw away all cigarettes.   Clean and remove all ashtrays from your home,  work, and car.  On a card, write down your reasons for quitting. Carry the card with you and read it when you get the urge to smoke.  Cleanse your body of nicotine. Drink enough water and fluids to keep your urine clear or pale yellow. Do this after quitting to flush the nicotine from your body.  Learn to predict your moods. Do not let a bad situation be your excuse to have a cigarette. Some situations in your life might  tempt you into wanting a cigarette.  Never have "just one" cigarette. It leads to wanting another and another. Remind yourself of your decision to quit.  Change habits associated with smoking. If you smoked while driving or when feeling stressed, try other activities to replace smoking. Stand up when drinking your coffee. Brush your teeth after eating. Sit in a different chair when you read the paper. Avoid alcohol while trying to quit, and try to drink fewer caffeinated beverages. Alcohol and caffeine may urge you to smoke.  Avoid foods and drinks that can trigger a desire to smoke, such as sugary or spicy foods and alcohol.  Ask people who smoke not to smoke around you.  Have something planned to do right after eating or having a cup of coffee. For example, plan to take a walk or exercise.  Try a relaxation exercise to calm you down and decrease your stress. Remember, you may be tense and nervous for the first 2 weeks after you quit, but this will pass.  Find new activities to keep your hands busy. Play with a pen, coin, or rubber band. Doodle or draw things on paper.  Brush your teeth right after eating. This will help cut down on the craving for the taste of tobacco after meals. You can also try mouthwash.   Use oral substitutes in place of cigarettes. Try using lemon drops, carrots, cinnamon sticks, or chewing gum. Keep them handy so they are available when you have the urge to smoke.  When you have the urge to smoke, try deep breathing.  Designate your home as a nonsmoking area.  If you are a heavy smoker, ask your health care provider about a prescription for nicotine chewing gum. It can ease your withdrawal from nicotine.  Reward yourself. Set aside the cigarette money you save and buy yourself something nice.  Look for support from others. Join a support group or smoking cessation program. Ask someone at home or at work to help you with your plan to quit smoking.  Always ask  yourself, "Do I need this cigarette or is this just a reflex?" Tell yourself, "Today, I choose not to smoke," or "I do not want to smoke." You are reminding yourself of your decision to quit.  Do not replace cigarette smoking with electronic cigarettes (commonly called e-cigarettes). The safety of e-cigarettes is unknown, and some may contain harmful chemicals.  If you relapse, do not give up! Plan ahead and think about what you will do the next time you get the urge to smoke. HOW WILL I FEEL WHEN I QUIT SMOKING? You may have symptoms of withdrawal because your body is used to nicotine (the addictive substance in cigarettes). You may crave cigarettes, be irritable, feel very hungry, cough often, get headaches, or have difficulty concentrating. The withdrawal symptoms are only temporary. They are strongest when you first quit but will go away within 10-14 days. When withdrawal symptoms occur, stay in control. Think about your reasons for quitting. Remind yourself  that these are signs that your body is healing and getting used to being without cigarettes. Remember that withdrawal symptoms are easier to treat than the major diseases that smoking can cause.  Even after the withdrawal is over, expect periodic urges to smoke. However, these cravings are generally short lived and will go away whether you smoke or not. Do not smoke! WHAT RESOURCES ARE AVAILABLE TO HELP ME QUIT SMOKING? Your health care provider can direct you to community resources or hospitals for support, which may include:  Group support.  Education.  Hypnosis.  Therapy.   This information is not intended to replace advice given to you by your health care provider. Make sure you discuss any questions you have with your health care provider.   Document Released: 10/01/2003 Document Revised: 01/23/2014 Document Reviewed: 06/20/2012 Elsevier Interactive Patient Education 2016 Elsevier Inc.  - Diabetes Mellitus and Food It is  important for you to manage your blood sugar (glucose) level. Your blood glucose level can be greatly affected by what you eat. Eating healthier foods in the appropriate amounts throughout the day at about the same time each day will help you control your blood glucose level. It can also help slow or prevent worsening of your diabetes mellitus. Healthy eating may even help you improve the level of your blood pressure and reach or maintain a healthy weight.  General recommendations for healthful eating and cooking habits include:  Eating meals and snacks regularly. Avoid going long periods of time without eating to lose weight.  Eating a diet that consists mainly of plant-based foods, such as fruits, vegetables, nuts, legumes, and whole grains.  Using low-heat cooking methods, such as baking, instead of high-heat cooking methods, such as deep frying. Work with your dietitian to make sure you understand how to use the Nutrition Facts information on food labels. HOW CAN FOOD AFFECT ME? Carbohydrates Carbohydrates affect your blood glucose level more than any other type of food. Your dietitian will help you determine how many carbohydrates to eat at each meal and teach you how to count carbohydrates. Counting carbohydrates is important to keep your blood glucose at a healthy level, especially if you are using insulin or taking certain medicines for diabetes mellitus. Alcohol Alcohol can cause sudden decreases in blood glucose (hypoglycemia), especially if you use insulin or take certain medicines for diabetes mellitus. Hypoglycemia can be a life-threatening condition. Symptoms of hypoglycemia (sleepiness, dizziness, and disorientation) are similar to symptoms of having too much alcohol.  If your health care provider has given you approval to drink alcohol, do so in moderation and use the following guidelines:  Women should not have more than one drink per day, and men should not have more than two drinks  per day. One drink is equal to:  12 oz of beer.  5 oz of wine.  1 oz of hard liquor.  Do not drink on an empty stomach.  Keep yourself hydrated. Have water, diet soda, or unsweetened iced tea.  Regular soda, juice, and other mixers might contain a lot of carbohydrates and should be counted. WHAT FOODS ARE NOT RECOMMENDED? As you make food choices, it is important to remember that all foods are not the same. Some foods have fewer nutrients per serving than other foods, even though they might have the same number of calories or carbohydrates. It is difficult to get your body what it needs when you eat foods with fewer nutrients. Examples of foods that you should avoid that are  high in calories and carbohydrates but low in nutrients include:  Trans fats (most processed foods list trans fats on the Nutrition Facts label).  Regular soda.  Juice.  Candy.  Sweets, such as cake, pie, doughnuts, and cookies.  Fried foods. WHAT FOODS CAN I EAT? Eat nutrient-rich foods, which will nourish your body and keep you healthy. The food you should eat also will depend on several factors, including:  The calories you need.  The medicines you take.  Your weight.  Your blood glucose level.  Your blood pressure level.  Your cholesterol level. You should eat a variety of foods, including:  Protein.  Lean cuts of meat.  Proteins low in saturated fats, such as fish, egg whites, and beans. Avoid processed meats.  Fruits and vegetables.  Fruits and vegetables that may help control blood glucose levels, such as apples, mangoes, and yams.  Dairy products.  Choose fat-free or low-fat dairy products, such as milk, yogurt, and cheese.  Grains, bread, pasta, and rice.  Choose whole grain products, such as multigrain bread, whole oats, and brown rice. These foods may help control blood pressure.  Fats.  Foods containing healthful fats, such as nuts, avocado, olive oil, canola oil, and  fish. DOES EVERYONE WITH DIABETES MELLITUS HAVE THE SAME MEAL PLAN? Because every person with diabetes mellitus is different, there is not one meal plan that works for everyone. It is very important that you meet with a dietitian who will help you create a meal plan that is just right for you.   This information is not intended to replace advice given to you by your health care provider. Make sure you discuss any questions you have with your health care provider.   Document Released: 09/29/2004 Document Revised: 01/23/2014 Document Reviewed: 11/29/2012 Elsevier Interactive Patient Education [employer].

## 2015-09-30 ENCOUNTER — Ambulatory Visit: Payer: Medicaid Other | Attending: Internal Medicine

## 2015-09-30 ENCOUNTER — Other Ambulatory Visit (HOSPITAL_COMMUNITY)
Admission: RE | Admit: 2015-09-30 | Discharge: 2015-09-30 | Disposition: A | Discharge status: Home / Self Care | Payer: Medicaid Other | Source: Ambulatory Visit | Attending: Internal Medicine | Admitting: Internal Medicine

## 2015-09-30 ENCOUNTER — Other Ambulatory Visit: Payer: Self-pay | Admitting: Internal Medicine

## 2015-09-30 DIAGNOSIS — N760 Acute vaginitis: Secondary | ICD-10-CM | POA: Insufficient documentation

## 2015-09-30 DIAGNOSIS — Z113 Encounter for screening for infections with a predominantly sexual mode of transmission: Secondary | ICD-10-CM | POA: Diagnosis present

## 2015-09-30 LAB — CBC WITH DIFFERENTIAL/PLATELET
BASOS PCT: 0 %
Basophils Absolute: 0 cells/uL (ref 0–200)
EOS ABS: 168 {cells}/uL (ref 15–500)
Eosinophils Relative: 3 %
HCT: 41.1 % (ref 35.0–45.0)
HEMOGLOBIN: 13.8 g/dL (ref 11.7–15.5)
LYMPHS ABS: 2016 {cells}/uL (ref 850–3900)
Lymphocytes Relative: 36 %
MCH: 31.1 pg (ref 27.0–33.0)
MCHC: 33.6 g/dL (ref 32.0–36.0)
MCV: 92.6 fL (ref 80.0–100.0)
MONOS PCT: 8 %
MPV: 9.9 fL (ref 7.5–12.5)
Monocytes Absolute: 448 cells/uL (ref 200–950)
NEUTROS ABS: 2968 {cells}/uL (ref 1500–7800)
Neutrophils Relative %: 53 %
PLATELETS: 244 10*3/uL (ref 140–400)
RBC: 4.44 MIL/uL (ref 3.80–5.10)
RDW: 12.9 % (ref 11.0–15.0)
WBC: 5.6 10*3/uL (ref 3.8–10.8)

## 2015-09-30 LAB — POCT URINALYSIS DIPSTICK
BILIRUBIN UA: NEGATIVE
GLUCOSE UA: NEGATIVE
Ketones, UA: NEGATIVE
NITRITE UA: NEGATIVE
Protein, UA: NEGATIVE
Spec Grav, UA: 1.01
Urobilinogen, UA: 0.2
pH, UA: 6.5

## 2015-09-30 LAB — TSH: TSH: 0.57 mIU/L

## 2015-09-30 NOTE — Addendum Note (Signed)
Addended byLottie Mussel T on: 09/30/2015 12:53 PM   Modules accepted: Orders

## 2015-09-30 NOTE — Progress Notes (Signed)
Patient here for lab visit only 

## 2015-10-01 LAB — BASIC METABOLIC PANEL WITH GFR
BUN: 9 mg/dL (ref 7–25)
CHLORIDE: 101 mmol/L (ref 98–110)
CO2: 25 mmol/L (ref 20–31)
Calcium: 9.4 mg/dL (ref 8.6–10.4)
Creat: 0.85 mg/dL (ref 0.50–0.99)
GFR, EST AFRICAN AMERICAN: 86 mL/min (ref >=60)
GFR, EST NON AFRICAN AMERICAN: 75 mL/min (ref >=60)
GLUCOSE: 87 mg/dL (ref 65–99)
Potassium: 4 mmol/L (ref 3.5–5.3)
Sodium: 139 mmol/L (ref 135–146)

## 2015-10-01 LAB — URINE CYTOLOGY ANCILLARY ONLY
Chlamydia: NEGATIVE
NEISSERIA GONORRHEA: NEGATIVE
TRICH (WINDOWPATH): NEGATIVE

## 2015-10-01 LAB — VITAMIN D 25 HYDROXY (VIT D DEFICIENCY, FRACTURES): VIT D 25 HYDROXY: 14 ng/mL — AB (ref 30–100)

## 2015-10-01 LAB — HIV ANTIBODY (ROUTINE TESTING W REFLEX): HIV: NONREACTIVE

## 2015-10-04 MED FILL — CROMOLYN 4% EYE DROPS: 4 | 18 days supply | Qty: 10 | Fill #2

## 2015-10-07 ENCOUNTER — Ambulatory Visit (HOSPITAL_COMMUNITY): Admission: RE | Admit: 2015-10-07 | Payer: Medicaid Other | Source: Ambulatory Visit

## 2015-10-08 ENCOUNTER — Telehealth: Payer: Self-pay

## 2015-10-08 NOTE — Telephone Encounter (Signed)
Contacted pt to go over lab results pt is aware of results and doesn't have any questions or concerns 

## 2015-10-14 MED FILL — LOVASTATIN 40 MG TABLET: 40 | 30 days supply | Qty: 30 | Fill #0

## 2015-10-14 MED FILL — AMLODIPINE BESYLATE 10 MG T: 10 | 30 days supply | Qty: 30 | Fill #0

## 2015-10-14 MED FILL — PROAIR HFA 90 MCG INHALER: 108 (90 BAS | 30 days supply | Qty: 9 | Fill #0

## 2015-10-14 MED FILL — LISINOPRIL 20 MG TABLET: 20 | 30 days supply | Qty: 30 | Fill #0

## 2015-10-20 ENCOUNTER — Ambulatory Visit: Payer: Medicaid Other | Admitting: Sports Medicine

## 2015-10-20 ENCOUNTER — Ambulatory Visit (HOSPITAL_COMMUNITY): Payer: Medicaid Other

## 2015-11-01 ENCOUNTER — Encounter: Payer: Self-pay | Admitting: Sports Medicine

## 2015-11-01 ENCOUNTER — Ambulatory Visit (INDEPENDENT_AMBULATORY_CARE_PROVIDER_SITE_OTHER): Payer: Medicaid Other | Admitting: Sports Medicine

## 2015-11-01 ENCOUNTER — Ambulatory Visit (HOSPITAL_COMMUNITY)
Admission: RE | Admit: 2015-11-01 | Discharge: 2015-11-01 | Disposition: A | Discharge status: Home / Self Care | Payer: Medicaid Other | Source: Ambulatory Visit | Attending: Internal Medicine | Admitting: Internal Medicine

## 2015-11-01 VITALS — BP 171/88 | HR 84 | Ht 67.0 in | Wt 176.0 lb

## 2015-11-01 DIAGNOSIS — Z01818 Encounter for other preprocedural examination: Secondary | ICD-10-CM | POA: Insufficient documentation

## 2015-11-01 DIAGNOSIS — I10 Essential (primary) hypertension: Secondary | ICD-10-CM | POA: Diagnosis not present

## 2015-11-01 DIAGNOSIS — M25511 Pain in right shoulder: Secondary | ICD-10-CM

## 2015-11-01 DIAGNOSIS — R011 Cardiac murmur, unspecified: Secondary | ICD-10-CM | POA: Insufficient documentation

## 2015-11-01 LAB — ECHOCARDIOGRAM COMPLETE
Height: 67 in
WEIGHTICAEL: 2816 [oz_av]

## 2015-11-01 MED ORDER — TRAMADOL HCL 50 MG PO TABS: 50.0000 mg | ORAL_TABLET | Freq: Two times a day (BID) | ORAL | 1 refills | Status: DC | PRN

## 2015-11-01 NOTE — Progress Notes (Signed)
  Echocardiogram 2D Echocardiogram has been performed.  Alison Ford M 11/01/2015, 3:50 PM

## 2015-11-01 NOTE — Progress Notes (Signed)
   Subjective:    Patient ID: Alison Ford, female    DOB: 10-19-1955, 60 y.o.   MRN: [id number]  HPI chief complaint: Right shoulder pain  Alison Ford comes in today with persistent right shoulder pain. She was last seen in the office in the summer of 2016. At that time, she was complaining of some right shoulder pain as well as numbness and tingling in the right arm. An MRI of her cervical spine showed a right sided C5-C6 disc bulge which resulted in moderate right foraminal narrowing. Although this may explain her numbness and tingling in the right arm, I did not think that it explained her shoulder pain and I recommended an MRI of her right shoulder. However, this was never done. She has simply been putting up the pain. But it has gotten worse recently. She is now having difficulty with most activities of daily living. Difficulty reaching overhead or around behind her back. She does notice weakness in the right arm due to pain. The numbness and tingling in the right arm continues to be intermittent. Previous x-rays of the right shoulder were unremarkable.  Interim medical history reviewed Medications reviewed Allergies are reviewed    Review of Systems    as above Objective:   Physical Exam  Well-developed, well-nourished. No acute distress.  Right shoulder: Patient has limited active forward flexion and abduction. Limited internal rotation actively. Passive range of motion is full. No tenderness to palpation at the acromioclavicular joint or over the bicipital groove. 4/5 strength with resisted supraspinatus and 4+/5 strength with resisted external rotation. Internal rotation is 5/5. Neurovascularly intact distally.  Left shoulder: Full active and passive range of motion. Rotator cuff strength is 5/5 throughout. Neurovascularly intact distally.      Assessment & Plan:   Chronic right shoulder pain worrisome for rotator cuff tear Intermittent right arm radiculopathy secondary to  C5-C6 disc bulge  I explained to the patient that she has two separate diagnoses. I would like to reorder the MRI of her right shoulder specifically to evaluate for a rotator cuff tear. If a tear is confirmed, she will likely need referral to orthopedics. Phone follow-up after that study to discuss the results and delineate further treatment. Of note, she is taking tramadol intermittently and this does help with her pain so I will give her a refill today.

## 2015-11-10 ENCOUNTER — Telehealth: Payer: Self-pay

## 2015-11-10 NOTE — Telephone Encounter (Signed)
Contacted pt to go over results pt didn't answer lvm asking pt to give me a call back at her earliest convenience

## 2015-11-11 ENCOUNTER — Other Ambulatory Visit: Payer: Medicaid Other

## 2015-11-11 ENCOUNTER — Telehealth: Payer: Self-pay

## 2015-11-11 NOTE — Telephone Encounter (Signed)
Pt returned call about results. Pt states she understands her results. Pt states she is not getting the surgery. Pt states she has having problems with her shoulder and she wanted to talk to Dr. Janne Napoleon about it

## 2015-11-15 ENCOUNTER — Encounter: Payer: Self-pay | Admitting: Internal Medicine

## 2015-11-15 NOTE — Telephone Encounter (Signed)
Called pt, confirmed dob.  Pt seeing [facility], planning to get her knees done prior to shoulder. Has MRIs pending.  Reviewed echo w/ pt, it is normal.

## 2015-11-15 NOTE — Progress Notes (Signed)
Pt seen by Howie Ill, 11/03/15 Scheduled for colonoscopy 12/05/15 at 11am w/ Dr Oletta Lamas. Will call to remind.  Scan notes.

## 2015-11-23 ENCOUNTER — Ambulatory Visit
Admission: RE | Admit: 2015-11-23 | Discharge: 2015-11-23 | Disposition: A | Discharge status: Home / Self Care | Payer: Medicaid Other | Source: Ambulatory Visit | Attending: Sports Medicine | Admitting: Sports Medicine

## 2015-11-23 DIAGNOSIS — M25511 Pain in right shoulder: Secondary | ICD-10-CM | POA: Diagnosis not present

## 2015-11-26 ENCOUNTER — Telehealth: Payer: Self-pay | Admitting: Sports Medicine

## 2015-11-26 NOTE — Telephone Encounter (Signed)
I spoke with the patient on the phone today after reviewing the MRI of her right shoulder. She has severe rotator cuff tendinopathy with high-grade partial tearing of the infraspinatus tendon and full-thickness retracted tears of the supraspinatus and subscapularis tendons. She also has mild to moderate glenohumeral joint changes. Based on these findings I've recommended referral to Dr. Mardelle Matte to discuss further treatment. Patient is in agreement with that plan. I will defer further workup and treatment to the discretion of Dr. Mardelle Matte.

## 2015-11-29 ENCOUNTER — Other Ambulatory Visit: Payer: Self-pay | Admitting: *Deleted

## 2015-11-29 DIAGNOSIS — G8929 Other chronic pain: Secondary | ICD-10-CM

## 2015-11-29 DIAGNOSIS — M25511 Pain in right shoulder: Principal | ICD-10-CM

## 2015-12-02 ENCOUNTER — Encounter: Payer: Self-pay | Admitting: Internal Medicine

## 2015-12-02 DIAGNOSIS — K635 Polyp of colon: Secondary | ICD-10-CM | POA: Diagnosis not present

## 2015-12-02 DIAGNOSIS — K640 First degree hemorrhoids: Secondary | ICD-10-CM | POA: Diagnosis not present

## 2015-12-02 DIAGNOSIS — Z1211 Encounter for screening for malignant neoplasm of colon: Secondary | ICD-10-CM | POA: Diagnosis not present

## 2015-12-02 DIAGNOSIS — D175 Benign lipomatous neoplasm of intra-abdominal organs: Secondary | ICD-10-CM | POA: Diagnosis not present

## 2015-12-02 DIAGNOSIS — D124 Benign neoplasm of descending colon: Secondary | ICD-10-CM | POA: Diagnosis not present

## 2015-12-03 ENCOUNTER — Encounter (HOSPITAL_COMMUNITY): Payer: Self-pay | Admitting: Emergency Medicine

## 2015-12-03 ENCOUNTER — Emergency Department (HOSPITAL_COMMUNITY)
Admission: EM | Admit: 2015-12-03 | Discharge: 2015-12-04 | Disposition: A | Discharge status: Home / Self Care | Payer: Medicare Other | Attending: Emergency Medicine | Admitting: Emergency Medicine

## 2015-12-03 DIAGNOSIS — F17200 Nicotine dependence, unspecified, uncomplicated: Secondary | ICD-10-CM | POA: Diagnosis not present

## 2015-12-03 DIAGNOSIS — R05 Cough: Secondary | ICD-10-CM | POA: Diagnosis present

## 2015-12-03 DIAGNOSIS — I10 Essential (primary) hypertension: Secondary | ICD-10-CM | POA: Insufficient documentation

## 2015-12-03 DIAGNOSIS — Z96651 Presence of right artificial knee joint: Secondary | ICD-10-CM | POA: Diagnosis not present

## 2015-12-03 DIAGNOSIS — J40 Bronchitis, not specified as acute or chronic: Secondary | ICD-10-CM | POA: Insufficient documentation

## 2015-12-03 NOTE — ED Triage Notes (Signed)
Pt. reports persistent productive cough for several weeks with nasal congestion and runny nose unrelieved by OTC medications , denies fever or chills .

## 2015-12-04 ENCOUNTER — Emergency Department (HOSPITAL_COMMUNITY): Payer: Medicare Other

## 2015-12-04 DIAGNOSIS — R05 Cough: Secondary | ICD-10-CM | POA: Diagnosis not present

## 2015-12-04 DIAGNOSIS — J40 Bronchitis, not specified as acute or chronic: Secondary | ICD-10-CM | POA: Diagnosis not present

## 2015-12-04 MED ORDER — IPRATROPIUM-ALBUTEROL 0.5-2.5 (3) MG/3ML IN SOLN
3.0000 mL | Freq: Once | RESPIRATORY_TRACT | Status: AC
  Administered 2015-12-04: 3 mL via RESPIRATORY_TRACT
  Filled 2015-12-04: qty 3

## 2015-12-04 MED ORDER — ALBUTEROL SULFATE HFA 108 (90 BASE) MCG/ACT IN AERS
2.0000 | INHALATION_SPRAY | RESPIRATORY_TRACT | Status: DC | PRN
  Administered 2015-12-04: 2 via RESPIRATORY_TRACT
  Filled 2015-12-04: qty 6.7

## 2015-12-04 MED ORDER — BENZONATATE 100 MG PO CAPS: 100.0000 mg | ORAL_CAPSULE | Freq: Three times a day (TID) | ORAL | 0 refills | Status: DC

## 2015-12-04 MED ORDER — AZITHROMYCIN 250 MG PO TABS: 250.0000 mg | ORAL_TABLET | Freq: Every day | ORAL | 0 refills | Status: DC

## 2015-12-04 NOTE — ED Provider Notes (Signed)
[facility] DEPT Provider Note   CSN: [id number] Arrival date & time: 12/03/15  2247     History   Chief Complaint Chief Complaint  Patient presents with  . Cough    HPI Alison Ford is a 60 y.o. female who presents to the ED with a cough and shortness of breath that started 3 weeks ago and has gotten progressively worse. She has felt like she is wheezing. She tried to sleep tonight the breathing got worse.   The history is provided by the patient. No language interpreter was used.  Cough  This is a new problem. The current episode started more than 1 week ago. The problem has been gradually worsening. The cough is productive of sputum. There has been no fever. Associated symptoms include chills, sweats, ear pain, sore throat, myalgias, shortness of breath and wheezing. Pertinent negatives include no weight loss. She has tried cough syrup and decongestants for the symptoms. She is a smoker. Her past medical history is significant for bronchitis. Her past medical history does not include pneumonia, COPD, emphysema or asthma.    Past Medical History:  Diagnosis Date  . Abnormal facial hair   . Back pain    d/t knee pain  . Headache(784.0)   . Heart murmur    slight  . HTN (hypertension)    takes Amlodipine daily and HCTZ   . Hyperlipidemia    takes Lovastatin nightly  . Joint pain   . Right knee DJD   . Weakness    left hand    Patient Active Problem List   Diagnosis Date Noted  . Eczema, dyshidrotic 11/18/2014  . Sebaceous cyst 11/18/2014  . Acne 11/18/2014  . Hyperpigmented skin lesion 11/18/2014  . Smoker 06/01/2014  . Orthostatic hypotension 06/01/2014  . Cellulitis and abscess of trunk 03/17/2014  . Right knee DJD   . Hyperlipidemia   . Abnormal EKG 06/05/2012  . HTN (hypertension) 06/05/2012    Past Surgical History:  Procedure Laterality Date  . ABDOMINAL HYSTERECTOMY    . Athroscopic knee surgery     Bilateral  . CHOLECYSTECTOMY    .  DILATION AND CURETTAGE OF UTERUS    . TOTAL KNEE ARTHROPLASTY Right 07/15/2012   Procedure: RIGHT TOTAL KNEE ARTHROPLASTY;  Surgeon: Lorn Junes, MD;  Location: [facility];  Service: Orthopedics;  Laterality: Right;    OB History    No data available       Home Medications    Prior to Admission medications   Medication Sig Start Date End Date Taking? Authorizing Provider  albuterol (PROVENTIL HFA;VENTOLIN HFA) 108 (90 Base) MCG/ACT inhaler Inhale 2 puffs into the lungs every 6 (six) hours as needed for wheezing or shortness of breath. 09/29/15  Yes Maren Reamer, MD  amLODipine (NORVASC) 10 MG tablet Take 1 tablet (10 mg total) by mouth daily. 09/29/15  Yes Maren Reamer, MD  Fluticasone-Salmeterol (ADVAIR) 100-50 MCG/DOSE AEPB Inhale 1 puff into the lungs 2 (two) times daily. 09/29/15  Yes Maren Reamer, MD  lisinopril (PRINIVIL,ZESTRIL) 20 MG tablet Take 1 tablet (20 mg total) by mouth daily. 09/29/15  Yes Maren Reamer, MD  lovastatin (MEVACOR) 40 MG tablet Take 1 tablet (40 mg total) by mouth at bedtime. 09/29/15  Yes Maren Reamer, MD  traMADol (ULTRAM) 50 MG tablet Take 1 tablet (50 mg total) by mouth every 12 (twelve) hours as needed. 11/01/15  Yes Carlos Levering Draper, DO  azithromycin (ZITHROMAX) 250 MG tablet Take  1 tablet (250 mg total) by mouth daily. Take first 2 tablets together, then 1 every day until finished. 12/04/15   Hope Bunnie Pion, NP  benzonatate (TESSALON) 100 MG capsule Take 1 capsule (100 mg total) by mouth every 8 (eight) hours. 12/04/15   Hope Bunnie Pion, NP  benzoyl peroxide (BREVOXYL) 4 % gel Apply topically at bedtime. Apply topically and wash off twice per day. Patient not taking: Reported on 12/03/2015 11/18/14   Nicolette Bang, DO  clotrimazole-betamethasone (LOTRISONE) cream Needs office visit for refills Patient not taking: Reported on 12/03/2015 05/12/15   Tresa Garter, MD  nicotine (NICODERM CQ - DOSED IN MG/24 HOURS) 21 mg/24hr patch  Place 1 patch (21 mg total) onto the skin daily. Patient not taking: Reported on 12/03/2015 02/11/15   Lance Bosch, NP  tretinoin (RETIN-A) 0.01 % gel Apply topically at bedtime. Patient not taking: Reported on 12/03/2015 11/18/14   Nicolette Bang, DO  triamcinolone cream (KENALOG) 0.1 % APPLY 1 APPLICATION TOPICALLY 2 TIMES DAILY. Patient not taking: Reported on 12/03/2015 08/12/14   Lance Bosch, NP    Family History Family History  Problem Relation Age of Onset  . CAD Father 2  . CAD Mother 81  . Hypertension Mother     Social History Social History  Substance Use Topics  . Smoking status: Current Every Day Smoker    Packs/day: 0.50  . Smokeless tobacco: Never Used     Comment: Smoking 3-4 cigs/day  . Alcohol use 0.0 oz/week     Comment: ocassionally     Allergies   Penicillins   Review of Systems Review of Systems  Constitutional: Positive for chills. Negative for weight loss.  HENT: Positive for congestion, ear pain and sore throat.   Eyes: Negative for visual disturbance.  Respiratory: Positive for cough, chest tightness, shortness of breath and wheezing.   Gastrointestinal: Negative for abdominal pain, nausea and vomiting.  Genitourinary: Negative for decreased urine volume, dysuria and frequency.  Musculoskeletal: Positive for myalgias.  Skin: Negative for rash.  Neurological: Negative for syncope and speech difficulty.  Psychiatric/Behavioral: Negative for confusion. The patient is not nervous/anxious.      Physical Exam Updated Vital Signs BP 153/77 (BP Location: Left Arm)   Pulse 77   Temp 98.6 F (37 C) (Oral)   Resp 16   SpO2 94%   Physical Exam  Constitutional: She is oriented to person, place, and time. She appears well-developed and well-nourished. No distress.  HENT:  Head: Normocephalic and atraumatic.  Right Ear: Tympanic membrane normal.  Left Ear: Tympanic membrane normal.  Nose: Rhinorrhea present.  Mouth/Throat: Uvula  is midline and mucous membranes are normal. Posterior oropharyngeal erythema present. No posterior oropharyngeal edema.  Eyes: EOM are normal.  Neck: Normal range of motion. Neck supple.  Cardiovascular: Normal rate and regular rhythm.   Pulmonary/Chest: Effort normal. No respiratory distress. Wheezes: occasional. She has no rales.  Abdominal: Soft. There is no tenderness.  Musculoskeletal: Normal range of motion. She exhibits no edema.  Neurological: She is alert and oriented to person, place, and time. No cranial nerve deficit.  Skin: Skin is warm and dry.  Psychiatric: She has a normal mood and affect. Her behavior is normal.  Nursing note and vitals reviewed.    ED Treatments / Results  Labs (all labs ordered are listed, but only abnormal results are displayed) Labs Reviewed - No data to display  Radiology Dg Chest 2 View  Result Date: 12/04/2015 CLINICAL  DATA:  Chronic productive cough.  Initial encounter. EXAM: CHEST  2 VIEW COMPARISON:  Chest radiograph performed 01/06/2015 FINDINGS: The lungs are well-aerated. Mild vascular congestion is noted. Mild peribronchial thickening is seen. There is no evidence of focal opacification, pleural effusion or pneumothorax. The heart is borderline normal in size. No acute osseous abnormalities are seen. Clips are noted within the right upper quadrant, reflecting prior cholecystectomy. IMPRESSION: Mild vascular congestion noted.  Mild peribronchial thickening seen. Electronically Signed   By: Garald Balding M.D.   On: 12/04/2015 00:10    Procedures Procedures (including critical care time)  Medications Ordered in ED Medications  ipratropium-albuterol (DUONEB) 0.5-2.5 (3) MG/3ML nebulizer solution 3 mL (3 mLs Nebulization Given 12/04/15 0120)     Initial Impression / Assessment and Plan / ED Course  I have reviewed the triage vital signs and the nursing notes.  Pertinent imaging results that were available during my care of the patient  were reviewed by me and considered in my medical decision making (see chart for details).  Clinical Course   60 y.o. female with cough and congestion stable for d/c without pulmonary edema or consolidation noted on CXR. No lower extremity edema.  Discussed with Dr. Christy Gentles and will treat the patient with course of antibiotics and she is given Albuterol Inhaler with instructions on use prior to d/c. Discussed with the patient need for f/u with  PCP and return precautions. All questioned fully answered. Patient agrees with plan.    Final Clinical Impressions(s) / ED Diagnoses   Final diagnoses:  Bronchitis    New Prescriptions Discharge Medication List as of 12/04/2015  1:52 AM    START taking these medications   Details  azithromycin (ZITHROMAX) 250 MG tablet Take 1 tablet (250 mg total) by mouth daily. Take first 2 tablets together, then 1 every day until finished., Starting Sat 12/04/2015, Print    benzonatate (TESSALON) 100 MG capsule Take 1 capsule (100 mg total) by mouth every 8 (eight) hours., Starting Sat 12/04/2015, [place], NP 12/04/15 1244    Ripley Fraise, MD 12/04/15 [phone]

## 2015-12-07 DIAGNOSIS — K635 Polyp of colon: Secondary | ICD-10-CM | POA: Diagnosis not present

## 2015-12-07 DIAGNOSIS — Z1211 Encounter for screening for malignant neoplasm of colon: Secondary | ICD-10-CM | POA: Diagnosis not present

## 2015-12-30 ENCOUNTER — Ambulatory Visit (INDEPENDENT_AMBULATORY_CARE_PROVIDER_SITE_OTHER): Payer: Medicare Other | Admitting: Orthopedic Surgery

## 2015-12-30 ENCOUNTER — Encounter (INDEPENDENT_AMBULATORY_CARE_PROVIDER_SITE_OTHER): Payer: Self-pay | Admitting: Orthopedic Surgery

## 2015-12-30 ENCOUNTER — Ambulatory Visit (INDEPENDENT_AMBULATORY_CARE_PROVIDER_SITE_OTHER): Payer: Medicare Other

## 2015-12-30 DIAGNOSIS — M542 Cervicalgia: Secondary | ICD-10-CM | POA: Insufficient documentation

## 2015-12-30 DIAGNOSIS — M7581 Other shoulder lesions, right shoulder: Secondary | ICD-10-CM

## 2015-12-30 MED ORDER — BUPIVACAINE HCL 0.5 % IJ SOLN
9.0000 mL | INTRAMUSCULAR | Status: AC | PRN
  Administered 2015-12-30: 9 mL via INTRA_ARTICULAR

## 2015-12-30 MED ORDER — LIDOCAINE HCL 1 % IJ SOLN
5.0000 mL | INTRAMUSCULAR | Status: AC | PRN
  Administered 2015-12-30: 5 mL

## 2015-12-30 MED ORDER — METHYLPREDNISOLONE ACETATE 40 MG/ML IJ SUSP
40.0000 mg | INTRAMUSCULAR | Status: AC | PRN
  Administered 2015-12-30: 40 mg via INTRA_ARTICULAR

## 2015-12-30 NOTE — Progress Notes (Signed)
Office Visit Note   Patient: Alison Ford           Date of Birth: 1955-10-09           MRN: [id number] Visit Date: 12/30/2015 Requested by: Thurman Coyer, DO [address] [place],  [address] PCP: Maren Reamer, MD  Subjective: Chief Complaint  Patient presents with  . Right Shoulder - Pain  . Neck - Pain    HPI Alison Ford is a 60 year old female with right shoulder pain for 3-4 years along with hand weakness and numbness and tingling on the right-hand side for the same amount of time.  Denies any history of injury.  States that she can hardly raise the shoulder at this time.  She describes pain radiating from the neck down to her hands with numbness and tingling on the dorsal and palmar aspect.  She reports weakness in the arm and dropping things.  She describes decreased grip strength on the right but not on the left.  She does describe pain with range of motion of the neck.  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  Taking Tylenol arthritis with minimal relief.  Tramadol also does not help.  She cannot sleep on the right-hand side.  She states that she is on disability for her knees and "everything else".  She has had an MRI scan of the shoulder which is reviewed.  She has biceps tendinitis as well as rotator cuff tear of the supraspinatus and significant bursitis and tendinosis.              Review of Systems All systems reviewed are negative as they relate to the chief complaint within the history of present illness.  Patient denies  fevers or chills.    Assessment & Plan: Visit Diagnoses:  1. Rotator cuff tendonitis, right   2. Cervicalgia     Plan: Impression is right shoulder rotator cuff tear and biceps tendinitis which I think will require surgical treatment in the future.  This is been going on now for 3-4 years.  She has  not had a subacromial injection which I would like to do today while working up her neck and hand.  In regards to the neck and again I think there is a potential for radiculopathy with some weakness.  In addition to that I think it's likely she has a degree of carpal tunnel syndrome.  Plan at this time is for MRI of the cervical spine.  Also nerve conduction study right wrist to evaluate for carpal tunnel syndrome.  I like to know if any further interventions required on the hand that can be done potentially at the same time as the shoulder.  Both problems are giving her equal amount of disability.  Follow-Up Instructions: No Follow-up on file.   Orders:  Orders Placed This Encounter  Procedures  . XR Cervical Spine 2 or 3 views   No orders of the defined types were placed in this encounter.     Procedures: Large Joint Inj Date/Time: 12/30/2015 3:38 PM Performed by: Meredith Pel Authorized by: Meredith Pel   Consent Given by:  Patient Site marked: the procedure site was marked   Timeout: prior to procedure the correct patient, procedure, and site was verified   Indications:  Pain and diagnostic evaluation Location:  Shoulder Site:  R subacromial bursa Prep: patient was  prepped and draped in usual sterile fashion   Needle Size:  18 G Needle Length:  1.5 inches Approach:  Posterior Ultrasound Guidance: No   Fluoroscopic Guidance: No   Arthrogram: No   Medications:  5 mL lidocaine 1 %; 9 mL bupivacaine 0.5 %; 40 mg methylPREDNISolone acetate 40 MG/ML Aspiration Attempted: No   Patient tolerance:  Patient tolerated the procedure well with no immediate complications     Clinical Data: No additional findings.  Objective: Vital Signs: There were no vitals taken for this visit.  Physical Exam on examination patient does have slightly weaker grip strength on the right compared to the left but EPL FPL interosseous wrist flexion-extension biceps triceps Doppler  strength is pretty symmetric.  Slightly weaker external rotation on the right compared to the left.  Impingement signs positive on the right negative on the left.  O'Brien's testing positive on the right negative on the left.  She has negative apprehension relocation testing on the right.  Mild before meals joint tenderness more on the right than the left and some pain with crossarm adduction.  Reflexes symmetric bilateral biceps triceps.  Radial pulses intact bilaterally.  Patient does have positive carpal tunnel compression testing on the right with negative Tinel's in the cubital tunnel the right-hand side  Constitutional: Patient appears well-developed HEENT:  Head: Normocephalic Eyes:EOM are normal Neck: Normal range of motion Cardiovascular: Normal rate Pulmonary/chest: Effort normal Neurologic: Patient is alert Skin: Skin is warm Psychiatric: Patient has normal mood and affect    Ortho Exam see above under physical exam  Specialty Comments:  No specialty comments available.  Imaging: Xr Cervical Spine 2 Or 3 Views  Result Date: 12/30/2015 AP lateral cervical spine reviewed.  Normal lordosis is present.  Spurring is noted off the anterior aspect of vertebral bodies 34 into a lesser degree 5.  Mild facet arthritis is present at C4-5.  No spondylolisthesis is present.  Visualized lung fields normal    PMFS History: Patient Active Problem List   Diagnosis Date Noted  . Rotator cuff tendonitis, right 12/30/2015  . Cervicalgia 12/30/2015  . Eczema, dyshidrotic 11/18/2014  . Sebaceous cyst 11/18/2014  . Acne 11/18/2014  . Hyperpigmented skin lesion 11/18/2014  . Smoker 06/01/2014  . Orthostatic hypotension 06/01/2014  . Cellulitis and abscess of trunk 03/17/2014  . Right knee DJD   . Hyperlipidemia   . Abnormal EKG 06/05/2012  . HTN (hypertension) 06/05/2012   Past Medical History:  Diagnosis Date  . Abnormal facial hair   . Back pain    d/t knee pain  .  Headache(784.0)   . Heart murmur    slight  . HTN (hypertension)    takes Amlodipine daily and HCTZ   . Hyperlipidemia    takes Lovastatin nightly  . Joint pain   . Right knee DJD   . Weakness    left hand    Family History  Problem Relation Age of Onset  . CAD Father 37  . CAD Mother 74  . Hypertension Mother     Past Surgical History:  Procedure Laterality Date  . ABDOMINAL HYSTERECTOMY    . Athroscopic knee surgery     Bilateral  . CHOLECYSTECTOMY    . DILATION AND CURETTAGE OF UTERUS    . TOTAL KNEE ARTHROPLASTY Right 07/15/2012   Procedure: RIGHT TOTAL KNEE ARTHROPLASTY;  Surgeon: Lorn Junes, MD;  Location: [facility];  Service: Orthopedics;  Laterality: Right;   Social History   Occupational History  .  [employer]   Social History Main Topics  . Smoking status: Current Every Day Smoker    Packs/day: 0.50  . Smokeless tobacco: Never Used     Comment: Smoking 3-4 cigs/day  . Alcohol use 0.0 oz/week     Comment: ocassionally  . Drug use: No  . Sexual activity: No

## 2016-01-27 MED FILL — PROAIR HFA 90 MCG INHALER: 108 (90 BAS | 30 days supply | Qty: 9 | Fill #1

## 2016-01-27 MED FILL — LOVASTATIN 40 MG TABLET: 40 | 30 days supply | Qty: 30 | Fill #1

## 2016-01-27 MED FILL — LISINOPRIL 20 MG TABLET: 20 | 30 days supply | Qty: 30 | Fill #1

## 2016-01-27 MED FILL — AMLODIPINE BESYLATE 10 MG T: 10 | 30 days supply | Qty: 30 | Fill #1

## 2016-01-27 MED FILL — CROMOLYN 4% EYE DROPS: 4 | 30 days supply | Qty: 10 | Fill #0

## 2016-01-28 ENCOUNTER — Telehealth: Payer: Self-pay | Admitting: Internal Medicine

## 2016-01-28 ENCOUNTER — Encounter (INDEPENDENT_AMBULATORY_CARE_PROVIDER_SITE_OTHER): Payer: Self-pay | Admitting: Physical Medicine and Rehabilitation

## 2016-01-28 ENCOUNTER — Ambulatory Visit (INDEPENDENT_AMBULATORY_CARE_PROVIDER_SITE_OTHER): Payer: Medicare Other | Admitting: Physical Medicine and Rehabilitation

## 2016-01-28 DIAGNOSIS — R202 Paresthesia of skin: Secondary | ICD-10-CM | POA: Diagnosis not present

## 2016-01-28 MED ORDER — LOVASTATIN 40 MG PO TABS: 40.0000 mg | ORAL_TABLET | Freq: Every day | ORAL | 0 refills | Status: DC

## 2016-01-28 NOTE — Telephone Encounter (Signed)
Lovastatin refilled.  ?

## 2016-01-28 NOTE — Telephone Encounter (Signed)
Medication Refill: lovastatin (MEVACOR) 40 MG tablet   Tried to schedule an appointment. We do not have any available. Pt will call back Monday morning to schedule. Pt requesting refill to last until her appointment

## 2016-01-28 NOTE — Progress Notes (Signed)
Alison Ford - 61 y.o. female MRN [id number]  Date of birth: May 28, 1955  Office Visit Note: Visit Date: 01/28/2016 PCP: Maren Reamer, MD Referred by: Maren Reamer, MD  Subjective: Chief Complaint  Patient presents with  . Right Arm - Pain   HPI: Mrs. Alison Ford is a right-hand dominant 61 year old female with pain in the right shoulder and arm and trouble making a fist with the right hand. She feels like she can't sleep on the right side and does get nocturnal complaints. She does get tingling and numbness in the whole hand and will radiate up the arm. She does get some weakness with holding objects. No prior carpal tunnel release no prior electrodiagnostic studies.    ROS Otherwise per HPI.  Assessment & Plan: Visit Diagnoses:  1. Paresthesia of skin     Plan: Findings:  Impression: The above electrodiagnostic study is ABNORMAL and reveals evidence of a moderate right median nerve entrapment at the wrist (carpal tunnel syndrome) affecting sensory and motor components.   There is no significant electrodiagnostic evidence of any other focal nerve entrapment, brachial plexopathy or cervical radiculopathy.    As you know, this particular electrodiagnostic study cannot rule out chemical radiculitis or sensory only radiculopathy.  Recommendations: 1.  Follow-up with referring physician. Correlation with symptoms is paramount. 2.  Continue current management of symptoms. 3.  Continue use of resting splint at night-time and as needed during the day. Consider surgical release.     Meds & Orders: No orders of the defined types were placed in this encounter.   Orders Placed This Encounter  Procedures  . NCV with EMG (electromyography)    Follow-up: Return for follow up with Dr. Marlou Sa after MRI.   Procedures: No procedures performed  EMG & NCV Findings: Evaluation of the right median motor nerve showed prolonged distal onset latency (4.6 ms) and decreased conduction  velocity (Elbow-Wrist, 45 m/s).  The right median (across palm) sensory nerve showed prolonged distal peak latency (Wrist, 4.8 ms).  The right ulnar sensory nerve showed reduced amplitude (8.1 V).  All remaining nerves (as indicated in the following tables) were within normal limits.    All examined muscles (as indicated in the following table) showed no evidence of electrical instability.    Impression: The above electrodiagnostic study is ABNORMAL and reveals evidence of a moderate right median nerve entrapment at the wrist (carpal tunnel syndrome) affecting sensory and motor components.   There is no significant electrodiagnostic evidence of any other focal nerve entrapment, brachial plexopathy or cervical radiculopathy.    As you know, this particular electrodiagnostic study cannot rule out chemical radiculitis or sensory only radiculopathy.  Recommendations: 1.  Follow-up with referring physician. Correlation with symptoms is paramount. 2.  Continue current management of symptoms. 3.  Continue use of resting splint at night-time and as needed during the day. Consider surgical release.   Nerve Conduction Studies Anti Sensory Summary Table   Stim Site NR Peak (ms) Norm Peak (ms) P-T Amp (V) Norm P-T Amp Site1 Site2 Delta-P (ms) Dist (cm) Vel (m/s) Norm Vel (m/s)  Right Median Acr Palm Anti Sensory (2nd Digit)  32.1C  Wrist    *4.8 <3.6 11.9 >10 Wrist Palm 2.8 0.0    Palm    2.0 <2.0 5.1         Right Radial Anti Sensory (Base 1st Digit)  32.4C  Wrist    2.2 <3.1 20.0  Wrist Base 1st Digit 2.2 0.0  Right Ulnar Anti Sensory (5th Digit)  32.3C  Wrist    3.7 <3.7 *8.1 >15.0 Wrist 5th Digit 3.7 14.0 38 >38   Motor Summary Table   Stim Site NR Onset (ms) Norm Onset (ms) O-P Amp (mV) Norm O-P Amp Site1 Site2 Delta-0 (ms) Dist (cm) Vel (m/s) Norm Vel (m/s)  Right Median Motor (Abd Poll Brev)  32.4C  Wrist    *4.6 <4.2 9.2 >5 Elbow Wrist 4.7 21.0 *45 >50  Elbow    9.3  8.5           Right Ulnar Motor (Abd Dig Min)  32.5C  Wrist    3.0 <4.2 8.1 >3 B Elbow Wrist 4.0 21.0 53 >53  B Elbow    7.0  7.6  A Elbow B Elbow 1.4 9.5 68 >53  A Elbow    8.4  6.6          EMG   Side Muscle Nerve Root Ins Act Fibs Psw Amp Dur Poly Recrt Int Fraser Din Comment  Right Abd Poll Brev Median C8-T1 Nml Nml Nml Nml Nml 0 Nml Nml   Right 1stDorInt Ulnar C8-T1 Nml Nml Nml Nml Nml 0 Nml Nml   Right PronatorTeres Median C6-7 Nml Nml Nml Nml Nml 0 Nml Nml   Right Biceps Musculocut C5-6 Nml Nml Nml Nml Nml 0 Nml Nml   Right Deltoid Axillary C5-6 Nml Nml Nml Nml Nml 0 Nml Nml     Nerve Conduction Studies Anti Sensory Left/Right Comparison   Stim Site L Lat (ms) R Lat (ms) L-R Lat (ms) L Amp (V) R Amp (V) L-R Amp (%) Site1 Site2 L Vel (m/s) R Vel (m/s) L-R Vel (m/s)  Median Acr Palm Anti Sensory (2nd Digit)  32.1C  Wrist  *4.8   11.9  Wrist Palm     Palm  2.0   5.1        Radial Anti Sensory (Base 1st Digit)  32.4C  Wrist  2.2   20.0  Wrist Base 1st Digit     Ulnar Anti Sensory (5th Digit)  32.3C  Wrist  3.7   *8.1  Wrist 5th Digit  38    Motor Left/Right Comparison   Stim Site L Lat (ms) R Lat (ms) L-R Lat (ms) L Amp (mV) R Amp (mV) L-R Amp (%) Site1 Site2 L Vel (m/s) R Vel (m/s) L-R Vel (m/s)  Median Motor (Abd Poll Brev)  32.4C  Wrist  *4.6   9.2  Elbow Wrist  *45   Elbow  9.3   8.5        Ulnar Motor (Abd Dig Min)  32.5C  Wrist  3.0   8.1  B Elbow Wrist  53   B Elbow  7.0   7.6  A Elbow B Elbow  68   A Elbow  8.4   6.6              Clinical History: No specialty comments available.  She reports that she has been smoking.  She has been smoking about 0.50 packs per day. She has never used smokeless tobacco.   Recent Labs  09/29/15 1209  HGBA1C 5.5    Objective:  VS:  HT:    WT:   BMI:     BP:   HR: bpm  TEMP: ( )  RESP:  Physical Exam  Musculoskeletal:  Examination of the hand shows some osteoarthritic changes without synovitis. There is no edema or color  change or allodynia.  She has good strength with long finger flexion and APB and FDI and abduction. She has intact sensation.  Neurological: She exhibits normal muscle tone. Coordination normal.    Ortho Exam Imaging: No results found.  Past Medical/Family/Surgical/Social History: Medications & Allergies reviewed per EMR Patient Active Problem List   Diagnosis Date Noted  . Rotator cuff tendonitis, right 12/30/2015  . Cervicalgia 12/30/2015  . Eczema, dyshidrotic 11/18/2014  . Sebaceous cyst 11/18/2014  . Acne 11/18/2014  . Hyperpigmented skin lesion 11/18/2014  . Smoker 06/01/2014  . Orthostatic hypotension 06/01/2014  . Cellulitis and abscess of trunk 03/17/2014  . Right knee DJD   . Hyperlipidemia   . Abnormal EKG 06/05/2012  . HTN (hypertension) 06/05/2012   Past Medical History:  Diagnosis Date  . Abnormal facial hair   . Back pain    d/t knee pain  . Headache(784.0)   . Heart murmur    slight  . HTN (hypertension)    takes Amlodipine daily and HCTZ   . Hyperlipidemia    takes Lovastatin nightly  . Joint pain   . Right knee DJD   . Weakness    left hand   Family History  Problem Relation Age of Onset  . CAD Father 62  . CAD Mother 58  . Hypertension Mother    Past Surgical History:  Procedure Laterality Date  . ABDOMINAL HYSTERECTOMY    . Athroscopic knee surgery     Bilateral  . CHOLECYSTECTOMY    . DILATION AND CURETTAGE OF UTERUS    . TOTAL KNEE ARTHROPLASTY Right 07/15/2012   Procedure: RIGHT TOTAL KNEE ARTHROPLASTY;  Surgeon: Lorn Junes, MD;  Location: [facility];  Service: Orthopedics;  Laterality: Right;   Social History   Occupational History  .  [employer]   Social History Main Topics  . Smoking status: Current Every Day Smoker    Packs/day: 0.50  . Smokeless tobacco: Never Used     Comment: Smoking 3-4 cigs/day  . Alcohol use 0.0 oz/week     Comment: ocassionally  . Drug use: No  . Sexual activity: No

## 2016-01-31 NOTE — Procedures (Signed)
EMG & NCV Findings: Evaluation of the right median motor nerve showed prolonged distal onset latency (4.6 ms) and decreased conduction velocity (Elbow-Wrist, 45 m/s).  The right median (across palm) sensory nerve showed prolonged distal peak latency (Wrist, 4.8 ms).  The right ulnar sensory nerve showed reduced amplitude (8.1 V).  All remaining nerves (as indicated in the following tables) were within normal limits.    All examined muscles (as indicated in the following table) showed no evidence of electrical instability.    Impression: The above electrodiagnostic study is ABNORMAL and reveals evidence of a moderate right median nerve entrapment at the wrist (carpal tunnel syndrome) affecting sensory and motor components.   There is no significant electrodiagnostic evidence of any other focal nerve entrapment, brachial plexopathy or cervical radiculopathy.    As you know, this particular electrodiagnostic study cannot rule out chemical radiculitis or sensory only radiculopathy.  Recommendations: 1.  Follow-up with referring physician. Correlation with symptoms is paramount. 2.  Continue current management of symptoms. 3.  Continue use of resting splint at night-time and as needed during the day. Consider surgical release.   Nerve Conduction Studies Anti Sensory Summary Table   Stim Site NR Peak (ms) Norm Peak (ms) P-T Amp (V) Norm P-T Amp Site1 Site2 Delta-P (ms) Dist (cm) Vel (m/s) Norm Vel (m/s)  Right Median Acr Palm Anti Sensory (2nd Digit)  32.1C  Wrist    *4.8 <3.6 11.9 >10 Wrist Palm 2.8 0.0    Palm    2.0 <2.0 5.1         Right Radial Anti Sensory (Base 1st Digit)  32.4C  Wrist    2.2 <3.1 20.0  Wrist Base 1st Digit 2.2 0.0    Right Ulnar Anti Sensory (5th Digit)  32.3C  Wrist    3.7 <3.7 *8.1 >15.0 Wrist 5th Digit 3.7 14.0 38 >38   Motor Summary Table   Stim Site NR Onset (ms) Norm Onset (ms) O-P Amp (mV) Norm O-P Amp Site1 Site2 Delta-0 (ms) Dist (cm) Vel (m/s) Norm Vel  (m/s)  Right Median Motor (Abd Poll Brev)  32.4C  Wrist    *4.6 <4.2 9.2 >5 Elbow Wrist 4.7 21.0 *45 >50  Elbow    9.3  8.5         Right Ulnar Motor (Abd Dig Min)  32.5C  Wrist    3.0 <4.2 8.1 >3 B Elbow Wrist 4.0 21.0 53 >53  B Elbow    7.0  7.6  A Elbow B Elbow 1.4 9.5 68 >53  A Elbow    8.4  6.6          EMG   Side Muscle Nerve Root Ins Act Fibs Psw Amp Dur Poly Recrt Int Fraser Din Comment  Right Abd Poll Brev Median C8-T1 Nml Nml Nml Nml Nml 0 Nml Nml   Right 1stDorInt Ulnar C8-T1 Nml Nml Nml Nml Nml 0 Nml Nml   Right PronatorTeres Median C6-7 Nml Nml Nml Nml Nml 0 Nml Nml   Right Biceps Musculocut C5-6 Nml Nml Nml Nml Nml 0 Nml Nml   Right Deltoid Axillary C5-6 Nml Nml Nml Nml Nml 0 Nml Nml     Nerve Conduction Studies Anti Sensory Left/Right Comparison   Stim Site L Lat (ms) R Lat (ms) L-R Lat (ms) L Amp (V) R Amp (V) L-R Amp (%) Site1 Site2 L Vel (m/s) R Vel (m/s) L-R Vel (m/s)  Median Acr Palm Anti Sensory (2nd Digit)  32.1C  Wrist  *4.8  11.9  Wrist Palm     Palm  2.0   5.1        Radial Anti Sensory (Base 1st Digit)  32.4C  Wrist  2.2   20.0  Wrist Base 1st Digit     Ulnar Anti Sensory (5th Digit)  32.3C  Wrist  3.7   *8.1  Wrist 5th Digit  38    Motor Left/Right Comparison   Stim Site L Lat (ms) R Lat (ms) L-R Lat (ms) L Amp (mV) R Amp (mV) L-R Amp (%) Site1 Site2 L Vel (m/s) R Vel (m/s) L-R Vel (m/s)  Median Motor (Abd Poll Brev)  32.4C  Wrist  *4.6   9.2  Elbow Wrist  *45   Elbow  9.3   8.5        Ulnar Motor (Abd Dig Min)  32.5C  Wrist  3.0   8.1  B Elbow Wrist  53   B Elbow  7.0   7.6  A Elbow B Elbow  68   A Elbow  8.4   6.6

## 2016-02-07 ENCOUNTER — Ambulatory Visit (INDEPENDENT_AMBULATORY_CARE_PROVIDER_SITE_OTHER): Payer: Medicare Other | Admitting: Orthopedic Surgery

## 2016-02-07 ENCOUNTER — Encounter (INDEPENDENT_AMBULATORY_CARE_PROVIDER_SITE_OTHER): Payer: Self-pay

## 2016-02-07 DIAGNOSIS — M7581 Other shoulder lesions, right shoulder: Secondary | ICD-10-CM

## 2016-02-07 NOTE — Progress Notes (Signed)
I called patient and left message on her machine

## 2016-02-10 ENCOUNTER — Other Ambulatory Visit: Payer: Self-pay | Admitting: Pharmacist

## 2016-02-10 DIAGNOSIS — R059 Cough, unspecified: Secondary | ICD-10-CM

## 2016-02-10 DIAGNOSIS — R05 Cough: Secondary | ICD-10-CM

## 2016-02-10 MED ORDER — ALBUTEROL SULFATE HFA 108 (90 BASE) MCG/ACT IN AERS: 2.0000 | INHALATION_SPRAY | Freq: Four times a day (QID) | RESPIRATORY_TRACT | 2 refills | Status: DC | PRN

## 2016-02-14 ENCOUNTER — Ambulatory Visit: Payer: Medicare Other | Attending: Internal Medicine | Admitting: Internal Medicine

## 2016-02-14 ENCOUNTER — Encounter: Payer: Self-pay | Admitting: Internal Medicine

## 2016-02-14 VITALS — BP 144/78 | HR 82 | Temp 98.4°F | Resp 16 | Wt 182.8 lb

## 2016-02-14 DIAGNOSIS — J209 Acute bronchitis, unspecified: Secondary | ICD-10-CM | POA: Diagnosis not present

## 2016-02-14 DIAGNOSIS — Z720 Tobacco use: Secondary | ICD-10-CM

## 2016-02-14 DIAGNOSIS — I10 Essential (primary) hypertension: Secondary | ICD-10-CM | POA: Insufficient documentation

## 2016-02-14 DIAGNOSIS — E785 Hyperlipidemia, unspecified: Secondary | ICD-10-CM | POA: Diagnosis not present

## 2016-02-14 DIAGNOSIS — J329 Chronic sinusitis, unspecified: Secondary | ICD-10-CM | POA: Diagnosis not present

## 2016-02-14 DIAGNOSIS — Z760 Encounter for issue of repeat prescription: Secondary | ICD-10-CM | POA: Diagnosis not present

## 2016-02-14 DIAGNOSIS — M1711 Unilateral primary osteoarthritis, right knee: Secondary | ICD-10-CM | POA: Insufficient documentation

## 2016-02-14 DIAGNOSIS — J440 Chronic obstructive pulmonary disease with acute lower respiratory infection: Secondary | ICD-10-CM

## 2016-02-14 MED ORDER — GUAIFENESIN-CODEINE 100-10 MG/5ML PO SOLN: 5.0000 mL | Freq: Three times a day (TID) | ORAL | 0 refills | Status: DC | PRN

## 2016-02-14 MED ORDER — SACCHAROMYCES BOULARDII 250 MG PO CAPS: 250.0000 mg | ORAL_CAPSULE | Freq: Two times a day (BID) | ORAL | 0 refills | Status: DC

## 2016-02-14 MED ORDER — BENZONATATE 100 MG PO CAPS: 100.0000 mg | ORAL_CAPSULE | Freq: Three times a day (TID) | ORAL | 0 refills | Status: DC

## 2016-02-14 MED ORDER — GUAIFENESIN ER 600 MG PO TB12: 600.0000 mg | ORAL_TABLET | Freq: Two times a day (BID) | ORAL | 0 refills | Status: DC

## 2016-02-14 MED ORDER — ALBUTEROL SULFATE HFA 108 (90 BASE) MCG/ACT IN AERS: 2.0000 | INHALATION_SPRAY | Freq: Four times a day (QID) | RESPIRATORY_TRACT | 11 refills | Status: DC | PRN

## 2016-02-14 MED ORDER — DOXYCYCLINE HYCLATE 100 MG PO TABS: 100.0000 mg | ORAL_TABLET | Freq: Two times a day (BID) | ORAL | 0 refills | Status: DC

## 2016-02-14 MED ORDER — FLUTICASONE-SALMETEROL 100-50 MCG/DOSE IN AEPB: 1.0000 | INHALATION_SPRAY | Freq: Two times a day (BID) | RESPIRATORY_TRACT | 3 refills | Status: DC

## 2016-02-14 MED ORDER — AMLODIPINE BESYLATE 10 MG PO TABS: 10.0000 mg | ORAL_TABLET | Freq: Every day | ORAL | 3 refills | Status: DC

## 2016-02-14 MED ORDER — LISINOPRIL 20 MG PO TABS: 20.0000 mg | ORAL_TABLET | Freq: Every day | ORAL | 2 refills | Status: DC

## 2016-02-14 MED ORDER — LOVASTATIN 40 MG PO TABS: 40.0000 mg | ORAL_TABLET | Freq: Every day | ORAL | 3 refills | Status: DC

## 2016-02-14 MED ORDER — CLOTRIMAZOLE-BETAMETHASONE 1-0.05 % EX CREA
TOPICAL_CREAM | CUTANEOUS | 7 refills | Status: DC
Start: 2016-02-14 — End: 2016-02-16

## 2016-02-14 MED FILL — ADVAIR 100/50 DISKUS: 100-50 | 30 days supply | Qty: 60 | Fill #0

## 2016-02-14 NOTE — Progress Notes (Signed)
Alison Ford, is a 61 y.o. female  [id number]  [id number]  DOB - 1955/05/20  Chief Complaint  Patient presents with  . Medication Refill  . URI        Subjective:   Alison Ford is a 61 y.o. female here today for a follow up visit.  Last seen 09/29/15, w/ hx of tob abuse, copd and rotator cuff tendonitis.  Today she co of congestion/white prod cough for the last 3 wks. Cough waking her up at night and unable to sleep. Of note, she mention green/yellow tinged sputum last week, w/ fevers/chils, tm 101, but fevers are improving now. She has been using her albuterol inhaler more so, did not know she had 2nd inhaler Advair - so has not been using.  Stopped smoking last 1 wk due to coughing   Patient has No headache, No chest pain, No abdominal pain - No Nausea, No new weakness tingling or numbness. No orthopnea.   No problems updated.  ALLERGIES: Allergies  Allergen Reactions  . Penicillins Itching and Other (See Comments)    Burning and whelps Has patient had a PCN reaction causing immediate rash, facial/tongue/throat swelling, SOB or lightheadedness with hypotension: NO Has patient had a PCN reaction causing severe rash involving mucus membranes or skin necrosis: NO Has patient had a PCN reaction that required hospitalization NO Has patient had a PCN reaction occurring within the last 10 years: NO If all of the above answers are "NO", then may proceed with Cephalosporin use.    PAST MEDICAL HISTORY: Past Medical History:  Diagnosis Date  . Abnormal facial hair   . Back pain    d/t knee pain  . Headache(784.0)   . Heart murmur    slight  . HTN (hypertension)    takes Amlodipine daily and HCTZ   . Hyperlipidemia    takes Lovastatin nightly  . Joint pain   . Right knee DJD   . Weakness    left hand    MEDICATIONS AT HOME: Prior to Admission medications   Medication Sig Start Date End Date Taking? Authorizing Provider  acetaminophen (TYLENOL) 650 MG CR  tablet Take 650 mg by mouth every 8 (eight) hours as needed for pain.    Historical Provider, MD  albuterol (PROVENTIL HFA;VENTOLIN HFA) 108 (90 Base) MCG/ACT inhaler Inhale 2 puffs into the lungs every 6 (six) hours as needed for wheezing or shortness of breath. 02/14/16   Maren Reamer, MD  amLODipine (NORVASC) 10 MG tablet Take 1 tablet (10 mg total) by mouth daily. 02/14/16   Maren Reamer, MD  azithromycin (ZITHROMAX) 250 MG tablet Take 1 tablet (250 mg total) by mouth daily. Take first 2 tablets together, then 1 every day until finished. 12/04/15   Hope Bunnie Pion, NP  benzonatate (TESSALON) 100 MG capsule Take 1 capsule (100 mg total) by mouth every 8 (eight) hours. 02/14/16   Maren Reamer, MD  benzoyl peroxide (BREVOXYL) 4 % gel Apply topically at bedtime. Apply topically and wash off twice per day. 11/18/14   Nicolette Bang, DO  clotrimazole-betamethasone (LOTRISONE) cream Needs office visit for refills 02/14/16   Maren Reamer, MD  doxycycline (VIBRA-TABS) 100 MG tablet Take 1 tablet (100 mg total) by mouth 2 (two) times daily. 02/14/16   Maren Reamer, MD  Fluticasone-Salmeterol (ADVAIR) 100-50 MCG/DOSE AEPB Inhale 1 puff into the lungs 2 (two) times daily. 02/14/16   Maren Reamer, MD  guaiFENesin (MUCINEX) 600 MG 12 hr  tablet Take 1 tablet (600 mg total) by mouth 2 (two) times daily. 02/14/16   Maren Reamer, MD  guaiFENesin-codeine 100-10 MG/5ML syrup Take 5 mLs by mouth 3 (three) times daily as needed for cough. 02/14/16   Maren Reamer, MD  lisinopril (PRINIVIL,ZESTRIL) 20 MG tablet Take 1 tablet (20 mg total) by mouth daily. 02/14/16   Maren Reamer, MD  lovastatin (MEVACOR) 40 MG tablet Take 1 tablet (40 mg total) by mouth at bedtime. 02/14/16   Maren Reamer, MD  nicotine (NICODERM CQ - DOSED IN MG/24 HOURS) 21 mg/24hr patch Place 1 patch (21 mg total) onto the skin daily. 02/11/15   Lance Bosch, NP  saccharomyces boulardii (FLORASTOR) 250 MG capsule Take  1 capsule (250 mg total) by mouth 2 (two) times daily. 02/14/16   Maren Reamer, MD  traMADol (ULTRAM) 50 MG tablet Take 1 tablet (50 mg total) by mouth every 12 (twelve) hours as needed. 11/01/15   Carlos Levering Draper, DO  tretinoin (RETIN-A) 0.01 % gel Apply topically at bedtime. 11/18/14   Nicolette Bang, DO  triamcinolone cream (KENALOG) 0.1 % APPLY 1 APPLICATION TOPICALLY 2 TIMES DAILY. 08/12/14   Lance Bosch, NP     Objective:   Vitals:   02/14/16 1631  BP: (!) 144/78  Pulse: 82  Resp: 16  Temp: 98.4 F (36.9 C)  TempSrc: Oral  SpO2: 96%  Weight: 182 lb 12.8 oz (82.9 kg)    Exam General appearance : Awake, alert, not in any distress. Speech Clear. Not toxic looking, pleasant. HEENT: Atraumatic and Normocephalic, pupils equally reactive to light. bilat TMs clear. No facial/max sinus ttp. Nares slightly erythematous, but not boggy bilat. Neck: supple, no JVD. No cervical lymphadenopathy.  Chest:diminished diffusely, but clear.  no added sounds. CVS: S1 S2 regular, no murmurs/gallups or rubs. Abdomen: Bowel sounds active, Non tender and not distended with no gaurding, rigidity or rebound. Extremities: B/L Lower Ext shows no edema, both legs are warm to touch Neurology: Awake alert, and oriented X 3, CN II-XII grossly intact, Non focal Skin:No Rash  Data Review Lab Results  Component Value Date   HGBA1C 5.5 09/29/2015   HGBA1C 6.2 (H) 03/17/2014    Depression screen [facility] 2/9 02/14/2016 11/01/2015 09/29/2015 11/18/2014 08/26/2014  Decreased Interest 0 0 2 0 0  Down, Depressed, Hopeless 0 0 0 0 0  PHQ - 2 Score 0 0 2 0 0  Altered sleeping - - 2 - -  Tired, decreased energy - - 2 - -  Change in appetite - - 0 - -  Feeling bad or failure about yourself  - - 0 - -  Trouble concentrating - - 0 - -  Moving slowly or fidgety/restless - - 0 - -  Suicidal thoughts - - 0 - -  PHQ-9 Score - - 6 - -  Difficult doing work/chores - - Not difficult at all - -       Assessment & Plan   1. Essential hypertension - not at goal of <130/80 but suspect URI sx making it worse. - amLODipine (NORVASC) 10 MG tablet; Take 1 tablet (10 mg total) by mouth daily.  Dispense: 90 tablet; Refill: 3 - renewed lisinopril 20 qd  2. Acute bronchitis with COPD ([place]), mild sinusitis as well Renewed ventolin (proair not covered by insurance) Renewed advair, advised her to pick up , 1 puff bid, to swish her mouth out after every use. - doxycycline 7 day rx. -  probiotics. - guifenesin- codeine prn for nighttime cough, advised not to drive with it since has codeine. - tessalon pearls prn - mucinex 600bid - drink plenty of fluids and stay hydrated  3. Tobacco abuse Total cessation recd, hasnt smoked in 1 week, encourage her to just stop.     Patient have been counseled extensively about nutrition and exercise  Return in about 3 months (around 05/14/2016), or if symptoms worsen or fail to improve.  The patient was given clear instructions to go to ER or return to medical center if symptoms don't improve, worsen or new problems develop. The patient verbalized understanding. The patient was told to call to get lab results if they haven't heard anything in the next week.   This note has been created with Surveyor, quantity. Any transcriptional errors are unintentional.   Maren Reamer, MD, [place] and [facility] [place], [place]   02/14/2016, 5:12 PM

## 2016-02-14 NOTE — Patient Instructions (Addendum)
- take probiotics while on antibiotics, florastor 2x day - if not, than Activia yogurt 2 x day  Acute Bronchitis, Adult Acute bronchitis is when air tubes (bronchi) in the lungs suddenly get swollen. The condition can make it hard to breathe. It can also cause these symptoms:  A cough.  Coughing up clear, yellow, or green mucus.  Wheezing.  Chest congestion.  Shortness of breath.  A fever.  Body aches.  Chills.  A sore throat. Follow these instructions at home: Medicines  Take over-the-counter and prescription medicines only as told by your doctor.  If you were prescribed an antibiotic medicine, take it as told by your doctor. Do not stop taking the antibiotic even if you start to feel better. General instructions  Rest.  Drink enough fluids to keep your pee (urine) clear or pale yellow.  Avoid smoking and secondhand smoke. If you smoke and you need help quitting, ask your doctor. Quitting will help your lungs heal faster.  Use an inhaler, cool mist vaporizer, or humidifier as told by your doctor.  Keep all follow-up visits as told by your doctor. This is important. How is this prevented? To lower your risk of getting this condition again:  Wash your hands often with soap and water. If you cannot use soap and water, use hand sanitizer.  Avoid contact with people who have cold symptoms.  Try not to touch your hands to your mouth, nose, or eyes.  Make sure to get the flu shot every year. Contact a doctor if:  Your symptoms do not get better in 2 weeks. Get help right away if:  You cough up blood.  You have chest pain.  You have very bad shortness of breath.  You become dehydrated.  You faint (pass out) or keep feeling like you are going to pass out.  You keep throwing up (vomiting).  You have a very bad headache.  Your fever or chills gets worse. This information is not intended to replace advice given to you by your health care provider. Make sure  you discuss any questions you have with your health care provider. Document Released: 06/21/2007 Document Revised: 08/11/2015 Document Reviewed: 06/23/2015 Elsevier Interactive Patient Education  2017 [employer].   -   Steps to Quit Smoking Smoking tobacco can be bad for your health. It can also affect almost every organ in your body. Smoking puts you and people around you at risk for many serious long-lasting (chronic) diseases. Quitting smoking is hard, but it is one of the best things that you can do for your health. It is never too late to quit. What are the benefits of quitting smoking? When you quit smoking, you lower your risk for getting serious diseases and conditions. They can include:  Lung cancer or lung disease.  Heart disease.  Stroke.  Heart attack.  Not being able to have children (infertility).  Weak bones (osteoporosis) and broken bones (fractures). If you have coughing, wheezing, and shortness of breath, those symptoms may get better when you quit. You may also get sick less often. If you are pregnant, quitting smoking can help to lower your chances of having a baby of low birth weight. What can I do to help me quit smoking? Talk with your doctor about what can help you quit smoking. Some things you can do (strategies) include:  Quitting smoking totally, instead of slowly cutting back how much you smoke over a period of time.  Going to in-person counseling. You are  more likely to quit if you go to many counseling sessions.  Using resources and support systems, such as:  Online chats with a Social worker.  Phone quitlines.  Printed Furniture conservator/restorer.  Support groups or group counseling.  Text messaging programs.  Mobile phone apps or applications.  Taking medicines. Some of these medicines may have nicotine in them. If you are pregnant or breastfeeding, do not take any medicines to quit smoking unless your doctor says it is okay. Talk with your doctor  about counseling or other things that can help you. Talk with your doctor about using more than one strategy at the same time, such as taking medicines while you are also going to in-person counseling. This can help make quitting easier. What things can I do to make it easier to quit? Quitting smoking might feel very hard at first, but there is a lot that you can do to make it easier. Take these steps:  Talk to your family and friends. Ask them to support and encourage you.  Call phone quitlines, reach out to support groups, or work with a Social worker.  Ask people who smoke to not smoke around you.  Avoid places that make you want (trigger) to smoke, such as:  Bars.  Parties.  Smoke-break areas at work.  Spend time with people who do not smoke.  Lower the stress in your life. Stress can make you want to smoke. Try these things to help your stress:  Getting regular exercise.  Deep-breathing exercises.  Yoga.  Meditating.  Doing a body scan. To do this, close your eyes, focus on one area of your body at a time from head to toe, and notice which parts of your body are tense. Try to relax the muscles in those areas.  Download or buy apps on your mobile phone or tablet that can help you stick to your quit plan. There are many free apps, such as QuitGuide from the [employer] Office manager for Disease Control and Prevention). You can find more support from smokefree.gov and other websites. This information is not intended to replace advice given to you by your health care provider. Make sure you discuss any questions you have with your health care provider. Document Released: 10/29/2008 Document Revised: 08/31/2015 Document Reviewed: 05/19/2014 Elsevier Interactive Patient Education  2017 [employer].

## 2016-02-16 ENCOUNTER — Other Ambulatory Visit: Payer: Self-pay | Admitting: Pharmacist

## 2016-02-16 MED ORDER — CLOTRIMAZOLE-BETAMETHASONE 1-0.05 % EX CREA: TOPICAL_CREAM | CUTANEOUS | 7 refills | Status: DC

## 2016-02-17 ENCOUNTER — Ambulatory Visit (INDEPENDENT_AMBULATORY_CARE_PROVIDER_SITE_OTHER): Payer: Medicare Other | Admitting: Orthopedic Surgery

## 2016-02-28 ENCOUNTER — Encounter (INDEPENDENT_AMBULATORY_CARE_PROVIDER_SITE_OTHER): Payer: Self-pay | Admitting: *Deleted

## 2016-02-28 ENCOUNTER — Ambulatory Visit (INDEPENDENT_AMBULATORY_CARE_PROVIDER_SITE_OTHER): Payer: Medicare Other | Admitting: Orthopedic Surgery

## 2016-02-28 ENCOUNTER — Encounter (INDEPENDENT_AMBULATORY_CARE_PROVIDER_SITE_OTHER): Payer: Self-pay | Admitting: Orthopedic Surgery

## 2016-02-28 DIAGNOSIS — M7581 Other shoulder lesions, right shoulder: Secondary | ICD-10-CM

## 2016-02-28 DIAGNOSIS — M542 Cervicalgia: Secondary | ICD-10-CM | POA: Diagnosis not present

## 2016-02-28 NOTE — Progress Notes (Signed)
Office Visit Note   Patient: Alison Ford           Date of Birth: 03-26-1955           MRN: [id number] Visit Date: 02/28/2016 Requested by: Maren Reamer, MD [place], [place] [address] PCP: Maren Reamer, MD  Subjective: Chief Complaint  Patient presents with  . Right Arm - Pain    HPI Alison Ford is a 61 year old female with right arm pain.  Here to review EMG nerve study which does show moderate carpal tunnel syndrome bilaterally.  She has the same pain in the right hand with numbness and loss of grip strength and dexterity.  She has not had her C-spine MRI scan yet.  She had a shoulder injection last clinic visit which gave her about 2 weeks of relief but then the pain is recurred.  Difficult for her to sleep on that side.  She reports continued pain radiating into the shoulder from the neck and also has localized deltoid-type pain in the shoulder.              Review of Systems All systems reviewed are negative as they relate to the chief complaint within the history of present illness.  Patient denies  fevers or chills.    Assessment & Plan: Visit Diagnoses:  1. Rotator cuff tendonitis, right   2. Cervicalgia     Plan: Impression is right arm pain carpal tunnel syndrome with possible rotator cuff issues in the shoulder.  She had an injection in the shoulder but her symptoms have recurred and persists.  Many of her wrist splint for that right hand and see if that helps enough to forego the need for carpal tunnel release.  In regards to the shoulder we need MRI scan to evaluate rotator cuff tear.  I'll see her back after those 2 studies and we'll see how she is doing with the wrist splint.  Follow-Up Instructions: No Follow-up on file.   Orders:  No orders of the defined types were placed in this encounter.  No orders of the defined types were placed in this encounter.     Procedures: No procedures performed   Clinical Data: No additional  findings.  Objective: Vital Signs: There were no vitals taken for this visit.  Physical Exam   Constitutional: Patient appears well-developed HEENT:  Head: Normocephalic Eyes:EOM are normal Neck: Normal range of motion Cardiovascular: Normal rate Pulmonary/chest: Effort normal Neurologic: Patient is alert Skin: Skin is warm Psychiatric: Patient has normal mood and affect    Ortho Exam orthopedic exam demonstrates decreased grip strength right versus left with some paresthesias digits 2 and 3.  Radial pulses intact bilaterally.  She does have some coarse grinding and crepitus on the right shoulder not present on the left along with full but her rotator cuff weakness to super status and status testing.  No acromioclavicular joint tenderness on the right or left.  Motor sensory function intact in the arms bilaterally.  Neck range of motion shows some pain with rotation to the right.  Specialty Comments:  No specialty comments available.  Imaging: No results found.   PMFS History: Patient Active Problem List   Diagnosis Date Noted  . Rotator cuff tendonitis, right 12/30/2015  . Cervicalgia 12/30/2015  . Eczema, dyshidrotic 11/18/2014  . Sebaceous cyst 11/18/2014  . Acne 11/18/2014  . Hyperpigmented skin lesion 11/18/2014  . Smoker 06/01/2014  . Orthostatic hypotension 06/01/2014  . Cellulitis and abscess  of trunk 03/17/2014  . Right knee DJD   . Hyperlipidemia   . Abnormal EKG 06/05/2012  . HTN (hypertension) 06/05/2012   Past Medical History:  Diagnosis Date  . Abnormal facial hair   . Back pain    d/t knee pain  . Headache(784.0)   . Heart murmur    slight  . HTN (hypertension)    takes Amlodipine daily and HCTZ   . Hyperlipidemia    takes Lovastatin nightly  . Joint pain   . Right knee DJD   . Weakness    left hand    Family History  Problem Relation Age of Onset  . CAD Father 81  . CAD Mother 64  . Hypertension Mother     Past Surgical History:    Procedure Laterality Date  . ABDOMINAL HYSTERECTOMY    . Athroscopic knee surgery     Bilateral  . CHOLECYSTECTOMY    . DILATION AND CURETTAGE OF UTERUS    . TOTAL KNEE ARTHROPLASTY Right 07/15/2012   Procedure: RIGHT TOTAL KNEE ARTHROPLASTY;  Surgeon: Lorn Junes, MD;  Location: [facility];  Service: Orthopedics;  Laterality: Right;   Social History   Occupational History  .  [employer]   Social History Main Topics  . Smoking status: Current Every Day Smoker    Packs/day: 0.50  . Smokeless tobacco: Never Used     Comment: Smoking 3-4 cigs/day  . Alcohol use 0.0 oz/week     Comment: ocassionally  . Drug use: No  . Sexual activity: No

## 2016-03-05 ENCOUNTER — Other Ambulatory Visit: Payer: Self-pay | Admitting: Internal Medicine

## 2016-03-08 ENCOUNTER — Ambulatory Visit
Admission: RE | Admit: 2016-03-08 | Discharge: 2016-03-08 | Disposition: A | Discharge status: Home / Self Care | Payer: Medicare Other | Source: Ambulatory Visit | Attending: Orthopedic Surgery | Admitting: Orthopedic Surgery

## 2016-03-08 DIAGNOSIS — M50221 Other cervical disc displacement at C4-C5 level: Secondary | ICD-10-CM | POA: Diagnosis not present

## 2016-03-08 DIAGNOSIS — M50222 Other cervical disc displacement at C5-C6 level: Secondary | ICD-10-CM | POA: Diagnosis not present

## 2016-03-08 DIAGNOSIS — M542 Cervicalgia: Secondary | ICD-10-CM

## 2016-03-13 ENCOUNTER — Ambulatory Visit (INDEPENDENT_AMBULATORY_CARE_PROVIDER_SITE_OTHER): Payer: Medicare Other | Admitting: Orthopedic Surgery

## 2016-03-16 ENCOUNTER — Encounter (INDEPENDENT_AMBULATORY_CARE_PROVIDER_SITE_OTHER): Payer: Self-pay | Admitting: Orthopedic Surgery

## 2016-03-16 ENCOUNTER — Other Ambulatory Visit (INDEPENDENT_AMBULATORY_CARE_PROVIDER_SITE_OTHER): Payer: Self-pay | Admitting: Orthopedic Surgery

## 2016-03-16 ENCOUNTER — Ambulatory Visit (INDEPENDENT_AMBULATORY_CARE_PROVIDER_SITE_OTHER): Payer: Medicare Other | Admitting: Orthopedic Surgery

## 2016-03-16 DIAGNOSIS — M7581 Other shoulder lesions, right shoulder: Secondary | ICD-10-CM | POA: Diagnosis not present

## 2016-03-16 DIAGNOSIS — M542 Cervicalgia: Secondary | ICD-10-CM | POA: Diagnosis not present

## 2016-03-16 DIAGNOSIS — M75111 Incomplete rotator cuff tear or rupture of right shoulder, not specified as traumatic: Secondary | ICD-10-CM

## 2016-03-16 MED ORDER — ACETAMINOPHEN-CODEINE #3 300-30 MG PO TABS: 1.0000 | ORAL_TABLET | Freq: Three times a day (TID) | ORAL | 0 refills | Status: DC | PRN

## 2016-03-16 NOTE — Progress Notes (Signed)
Office Visit Note   Patient: Alison Ford           Date of Birth: [birth year]           MRN: [id number] Visit Date: 03/16/2016 Requested by: Maren Reamer, MD [place], [place] [address] PCP: Maren Reamer, MD  Subjective: Chief Complaint  Patient presents with  . Neck - Pain  . Right Shoulder - Pain    HPI: Alison Ford is a 61 year old patient here to review MRI scan of her neck.  She's had an MRI scan of the shoulder which shows rotator cuff tearing of the supraspinatus and subscap.  Right arm hurts to raise up.  She also reports loss of dexterity in the hand.  She has moderate carpal tunnel syndrome in the hand.  She tried tramadol but hasn't helped much.  She wants to try to take something to help get her to sleep.  She is not very keen on the idea of neck injections.              ROS: All systems reviewed are negative as they relate to the chief complaint within the history of present illness.  Patient denies  fevers or chills.   Assessment & Plan: Visit Diagnoses:  1. Rotator cuff tendonitis, right   2. Cervicalgia     Plan: Impression is 3 problems #1 cervical spondylosis #2 rotator cuff tearing of the right shoulder #3 moderate carpal tunnel syndrome in the right hand.  I think the hand and shoulder could be addressed at 1 operative setting.  I think the neck is something that doesn't look like an necessarily need surgery but something that could potentially benefit from injections.  She does have moderate right and mild left foraminal narrowing at C4-5 and moderate right foraminal narrowing at C5-6.  Plan at this time is surgical fixation of the rotator cuff and release the carpal tunnel on the right-hand side.  Risk and benefits discussed including not limited to infection nerve vessel damage shoulder stiffness prolonged rate of recovery.  Plan to use CPM machine for 2 weeks on the shoulder.  I will try her with Tylenol codeine prior to surgery likely use  oxycodone after surgery.  All questions answered  Follow-Up Instructions: No Follow-up on file.   Orders:  No orders of the defined types were placed in this encounter.  No orders of the defined types were placed in this encounter.     Procedures: No procedures performed   Clinical Data: No additional findings.  Objective: Vital Signs: There were no vitals taken for this visit.  Physical Exam:   Constitutional: Patient appears well-developed HEENT:  Head: Normocephalic Eyes:EOM are normal Neck: Normal range of motion Cardiovascular: Normal rate Pulmonary/chest: Effort normal Neurologic: Patient is alert Skin: Skin is warm Psychiatric: Patient has normal mood and affect  *  Ortho Exam: Orthopedic exam demonstrates some weakness to supraspinatus testing on the right but no restriction of external rotation at this time.  Impingement signs positive on the right.  Carpal tunnel compression testing positive on the right negative on the left.  Negative Tinel's cubital tunnel the right-hand side wrist range of motion she elbow range of motion on the right otherwise full.  She does have some tenderness with head rotation to the right.  Specialty Comments:  No specialty comments available.  Imaging: No results found.   PMFS History: Patient Active Problem List   Diagnosis Date Noted  . Rotator cuff  tendonitis, right 12/30/2015  . Cervicalgia 12/30/2015  . Eczema, dyshidrotic 11/18/2014  . Sebaceous cyst 11/18/2014  . Acne 11/18/2014  . Hyperpigmented skin lesion 11/18/2014  . Smoker 06/01/2014  . Orthostatic hypotension 06/01/2014  . Cellulitis and abscess of trunk 03/17/2014  . Right knee DJD   . Hyperlipidemia   . Abnormal EKG 06/05/2012  . HTN (hypertension) 06/05/2012   Past Medical History:  Diagnosis Date  . Abnormal facial hair   . Back pain    d/t knee pain  . Headache(784.0)   . Heart murmur    slight  . HTN (hypertension)    takes Amlodipine  daily and HCTZ   . Hyperlipidemia    takes Lovastatin nightly  . Joint pain   . Right knee DJD   . Weakness    left hand    Family History  Problem Relation Age of Onset  . CAD Father 12  . CAD Mother 50  . Hypertension Mother     Past Surgical History:  Procedure Laterality Date  . ABDOMINAL HYSTERECTOMY    . Athroscopic knee surgery     Bilateral  . CHOLECYSTECTOMY    . DILATION AND CURETTAGE OF UTERUS    . TOTAL KNEE ARTHROPLASTY Right 07/15/2012   Procedure: RIGHT TOTAL KNEE ARTHROPLASTY;  Surgeon: Lorn Junes, MD;  Location: [facility];  Service: Orthopedics;  Laterality: Right;   Social History   Occupational History  .  [employer]   Social History Main Topics  . Smoking status: Current Every Day Smoker    Packs/day: 0.50  . Smokeless tobacco: Never Used     Comment: Smoking 3-4 cigs/day  . Alcohol use 0.0 oz/week     Comment: ocassionally  . Drug use: No  . Sexual activity: No

## 2016-03-16 NOTE — Addendum Note (Signed)
Addended byBrand Males on: 03/16/2016 02:34 PM   Modules accepted: Orders

## 2016-03-23 MED FILL — LOVASTATIN 40 MG TABLET: 40 | 30 days supply | Qty: 30 | Fill #2

## 2016-03-23 MED FILL — AMLODIPINE BESYLATE 10 MG T: 10 | 30 days supply | Qty: 30 | Fill #2

## 2016-03-23 MED FILL — LISINOPRIL 20 MG TABLET: 20 | 30 days supply | Qty: 30 | Fill #2

## 2016-03-29 ENCOUNTER — Telehealth (INDEPENDENT_AMBULATORY_CARE_PROVIDER_SITE_OTHER): Payer: Self-pay | Admitting: *Deleted

## 2016-03-29 NOTE — Telephone Encounter (Signed)
I called and spoke with patient. She stated that she was confused because she had recently had an MRI of her neck and shoulder. I explained to patient that the MRI she was having of her shoulder this time was with contrast and previous was without. She is now agreeable to proceeding with scheduling MRI scan.

## 2016-03-29 NOTE — Telephone Encounter (Signed)
Received call from Alison Ford at [facility] imaging stating she called pt to get scheduled for MRI arthrogram and pt told her she did not need it.   Does pt still need MRI arthrogram of shoulder?  Please advise!

## 2016-03-30 NOTE — Telephone Encounter (Signed)
I called sara back from [facility] and advised her that pt was agreeable to doing the MRI arthrogram, Clarise Cruz will call pt to get scheduled.

## 2016-04-03 ENCOUNTER — Other Ambulatory Visit: Payer: Self-pay | Admitting: Internal Medicine

## 2016-04-03 MED ORDER — ALBUTEROL SULFATE HFA 108 (90 BASE) MCG/ACT IN AERS: 2.0000 | INHALATION_SPRAY | Freq: Four times a day (QID) | RESPIRATORY_TRACT | 11 refills | Status: DC | PRN

## 2016-04-05 ENCOUNTER — Other Ambulatory Visit: Payer: Self-pay | Admitting: Internal Medicine

## 2016-04-05 ENCOUNTER — Other Ambulatory Visit: Payer: Self-pay | Admitting: Pharmacist

## 2016-04-05 DIAGNOSIS — Z1231 Encounter for screening mammogram for malignant neoplasm of breast: Secondary | ICD-10-CM

## 2016-04-05 MED ORDER — VENTOLIN HFA 108 (90 BASE) MCG/ACT IN AERS
2.0000 | INHALATION_SPRAY | Freq: Four times a day (QID) | RESPIRATORY_TRACT | 2 refills | Status: DC | PRN
Start: 2016-04-05 — End: 2016-05-30

## 2016-04-12 ENCOUNTER — Other Ambulatory Visit (INDEPENDENT_AMBULATORY_CARE_PROVIDER_SITE_OTHER): Payer: Self-pay | Admitting: Orthopedic Surgery

## 2016-04-13 ENCOUNTER — Telehealth (INDEPENDENT_AMBULATORY_CARE_PROVIDER_SITE_OTHER): Payer: Self-pay | Admitting: Orthopedic Surgery

## 2016-04-13 NOTE — Telephone Encounter (Signed)
Dr Marlou Sa did not see this before he left for surgery. Can you advise? She is scheduled for surgery in April on her shoulder.

## 2016-04-13 NOTE — Telephone Encounter (Signed)
Rx refill

## 2016-04-13 NOTE — Telephone Encounter (Signed)
PT CALLED TO VERIFY THAT SHE USES [facility] ON [address] AS HER PHARMACY.

## 2016-04-13 NOTE — Telephone Encounter (Signed)
Pt called back about tylenol refill

## 2016-04-13 NOTE — Telephone Encounter (Signed)
Ok to rf pls calla htx

## 2016-04-13 NOTE — Telephone Encounter (Signed)
IC patient. No answer. LM for her advising rx had been submitted.

## 2016-04-14 ENCOUNTER — Other Ambulatory Visit (INDEPENDENT_AMBULATORY_CARE_PROVIDER_SITE_OTHER): Payer: Self-pay | Admitting: Orthopedic Surgery

## 2016-04-18 ENCOUNTER — Telehealth (INDEPENDENT_AMBULATORY_CARE_PROVIDER_SITE_OTHER): Payer: Self-pay | Admitting: Orthopedic Surgery

## 2016-04-18 NOTE — Telephone Encounter (Signed)
Pt called back to speak with lauren about this RX.

## 2016-04-18 NOTE — Telephone Encounter (Signed)
Rx request 

## 2016-04-18 NOTE — Telephone Encounter (Signed)
Tried to call patient back. No answer. Left her a detailed message advising I called pharmacy this morning and gave rx information to pharmacy. Also apologized for the confusion. I had called rx into pharmacy last week and the pharmacy stated they never received it. This rx has been called into pharmacy again today.

## 2016-04-18 NOTE — Telephone Encounter (Signed)
Called and verified with pharmacy. No rx was received. I had called rx to pharmacy on Thursday. I gave information to pharmacist. Called and left message for patient advising that her rx was submitted again to pharmacy.

## 2016-04-18 NOTE — Telephone Encounter (Signed)
Pt stated pharmacy does not have RX, if we could send again

## 2016-04-18 NOTE — Telephone Encounter (Signed)
Patient called advised her Rx should have been sent to [facility], [place] on [address]. The # to contact patient is [phone]

## 2016-04-24 ENCOUNTER — Inpatient Hospital Stay (INDEPENDENT_AMBULATORY_CARE_PROVIDER_SITE_OTHER): Payer: Medicare Other | Admitting: Orthopedic Surgery

## 2016-04-25 ENCOUNTER — Inpatient Hospital Stay: Admission: RE | Admit: 2016-04-25 | Payer: Medicare Other | Source: Ambulatory Visit

## 2016-04-26 ENCOUNTER — Inpatient Hospital Stay (HOSPITAL_COMMUNITY)
Admission: RE | Admit: 2016-04-26 | Discharge: 2016-04-26 | Disposition: A | Discharge status: Home / Self Care | Payer: Medicare Other | Source: Ambulatory Visit

## 2016-04-26 NOTE — Pre-Procedure Instructions (Signed)
Alison Ford  04/26/2016      [place], [place] [address] [place] [address] Phone: [phone] Fax: [phone]  [place], [place] [address] [place] [address] Phone: [phone] Fax: [phone]    Your procedure is scheduled on 05/02/16.  Report to [facility] Admitting at [facility] A.[facility].  Call this number if you have problems the morning of surgery:  [phone]   Remember:  Do not eat food or drink liquids after midnight.  Take these medicines the morning of surgery with A SIP OF WATER       Amlodipine(norvasc),sinhalers if needed(bring ventolin),tylenol if needed  STOP all herbel meds, nsaids (aleve,naproxen,advil,ibuprofen) starting NOW4/11/18 including all vitamins/supplements, probiotic   Do not wear jewelry, make-up or nail polish.  Do not wear lotions, powders, or perfumes, or deoderant.  Do not shave 48 hours prior to surgery.  Men may shave face and neck.  Do not bring valuables to the hospital.  [facility] is not responsible for any belongings or valuables.  Contacts, dentures or bridgework may not be worn into surgery.  Leave your suitcase in the car.  After surgery it may be brought to your room.  For patients admitted to the hospital, discharge time will be determined by your treatment team.  Patients discharged the day of surgery will not be allowed to drive home.   Special instructions:   Special Instructions: [facility] - Preparing for Surgery  Before surgery, you can play an important role.  Because skin is not sterile, your skin needs to be as free of germs as possible.  You can reduce the number of germs on you skin by washing with CHG (chlorahexidine gluconate) soap before surgery.  CHG is an antiseptic cleaner which kills germs and bonds with the skin to continue killing germs even after  washing.  Please DO NOT use if you have an allergy to CHG or antibacterial soaps.  If your skin becomes reddened/irritated stop using the CHG and inform your nurse when you arrive at Short Stay.  Do not shave (including legs and underarms) for at least 48 hours prior to the first CHG shower.  You may shave your face.  Please follow these instructions carefully:   1.  Shower with CHG Soap the night before surgery and the morning of Surgery.  2.  If you choose to wash your hair, wash your hair first as usual with your normal shampoo.  3.  After you shampoo, rinse your hair and body thoroughly to remove the Shampoo.  4.  Use CHG as you would any other liquid soap.  You can apply chg directly  to the skin and wash gently with scrungie or a clean washcloth.  5.  Apply the CHG Soap to your body ONLY FROM THE NECK DOWN.  Do not use on open wounds or open sores.  Avoid contact with your eyes ears, mouth and genitals (private parts).  Wash genitals (private parts)       with your normal soap.  6.  Wash thoroughly, paying special attention to the area where your surgery will be performed.  7.  Thoroughly rinse your body with warm water from the neck down.  8.  DO NOT shower/wash with your normal soap after using and rinsing off the CHG Soap.  9.  Pat yourself dry with a clean towel.  10.  Wear clean pajamas.            11.  Place clean sheets on your bed the night of your first shower and do not sleep with pets.  Day of Surgery  Do not apply any lotions/deodorants the morning of surgery.  Please wear clean clothes to the hospital/surgery center.  Please read over the  fact sheets that you were given.

## 2016-04-27 ENCOUNTER — Encounter (HOSPITAL_COMMUNITY): Payer: Self-pay

## 2016-04-27 ENCOUNTER — Encounter (HOSPITAL_COMMUNITY)
Admission: RE | Admit: 2016-04-27 | Discharge: 2016-04-27 | Disposition: A | Discharge status: Home / Self Care | Payer: Medicare Other | Source: Ambulatory Visit | Attending: Orthopedic Surgery | Admitting: Orthopedic Surgery

## 2016-04-27 DIAGNOSIS — Z01812 Encounter for preprocedural laboratory examination: Secondary | ICD-10-CM | POA: Diagnosis not present

## 2016-04-27 DIAGNOSIS — E785 Hyperlipidemia, unspecified: Secondary | ICD-10-CM | POA: Diagnosis not present

## 2016-04-27 DIAGNOSIS — I10 Essential (primary) hypertension: Secondary | ICD-10-CM | POA: Diagnosis not present

## 2016-04-27 LAB — CBC
HCT: 41.2 % (ref 36.0–46.0)
Hemoglobin: 13.7 g/dL (ref 12.0–15.0)
MCH: 30.9 pg (ref 26.0–34.0)
MCHC: 33.3 g/dL (ref 30.0–36.0)
MCV: 92.8 fL (ref 78.0–100.0)
PLATELETS: 237 10*3/uL (ref 150–400)
RBC: 4.44 MIL/uL (ref 3.87–5.11)
RDW: 12.1 % (ref 11.5–15.5)
WBC: 6.1 10*3/uL (ref 4.0–10.5)

## 2016-04-27 LAB — BASIC METABOLIC PANEL
ANION GAP: 7 (ref 5–15)
BUN: 12 mg/dL (ref 6–20)
CHLORIDE: 108 mmol/L (ref 101–111)
CO2: 24 mmol/L (ref 22–32)
Calcium: 9.3 mg/dL (ref 8.9–10.3)
Creatinine, Ser: 0.64 mg/dL (ref 0.44–1.00)
GFR calc Af Amer: >60 mL/min (ref >60)
Glucose, Bld: 91 mg/dL (ref 65–99)
POTASSIUM: 4.1 mmol/L (ref 3.5–5.1)
SODIUM: 139 mmol/L (ref 135–145)

## 2016-05-01 NOTE — H&P (Signed)
Alison Ford is an 61 y.o. female.   Chief Complaint: Right shoulder pain right hand numbness HPI: Alison Ford is a 61 year old female with right shoulder pain and right hand numbness.  EMG nerve study consistent with carpal tunnel syndrome which is been refractory to nonoperative management.  She reports daily symptoms as well as numbness and tingling and loss of dexterity.  She would like to have carpal tunnel release performed in order to try to achieve pain relief and restoration of function.  Patient also reports right shoulder pain of several months duration.  She's had conservative treatment but has a rotator cuff tear by MRI scanning and would also like to have that addressed due to failure of conservative management and persistent symptoms of weakness and pain.  Past Medical History:  Diagnosis Date  . Abnormal facial hair   . Back pain    d/t knee pain  . Headache(784.0)   . Heart murmur    slight  . HTN (hypertension)    takes Amlodipine daily and HCTZ   . Hyperlipidemia    takes Lovastatin nightly  . Joint pain   . Right knee DJD   . Weakness    left hand    Past Surgical History:  Procedure Laterality Date  . ABDOMINAL HYSTERECTOMY    . Athroscopic knee surgery     Bilateral  . CHOLECYSTECTOMY    . DILATION AND CURETTAGE OF UTERUS    . EYE SURGERY Right   . TOTAL KNEE ARTHROPLASTY Right 07/15/2012   Procedure: RIGHT TOTAL KNEE ARTHROPLASTY;  Surgeon: Lorn Junes, MD;  Location: [facility];  Service: Orthopedics;  Laterality: Right;    Family History  Problem Relation Age of Onset  . CAD Father 69  . CAD Mother 40  . Hypertension Mother    Social History:  reports that she has been smoking.  She has been smoking about 0.50 packs per day. She has never used smokeless tobacco. She reports that she drinks alcohol. She reports that she does not use drugs.  Allergies:  Allergies  Allergen Reactions  . Penicillins Itching and Other (See Comments)    Burning and  whelps Has patient had a PCN reaction causing immediate rash, facial/tongue/throat swelling, SOB or lightheadedness with hypotension: NO Has patient had a PCN reaction causing severe rash involving mucus membranes or skin necrosis: NO Has patient had a PCN reaction that required hospitalization: NO Has patient had a PCN reaction occurring within the last 10 years: NO If all of the above answers are "NO", then may proceed with Cephalosporin use.    No prescriptions prior to admission.    No results found for this or any previous visit (from the past 48 hour(s)). No results found.  Review of Systems  Musculoskeletal: Positive for joint pain.  All other systems reviewed and are negative.   There were no vitals taken for this visit. Physical Exam  Constitutional: She appears well-developed.  HENT:  Head: Normocephalic.  Eyes: Pupils are equal, round, and reactive to light.  Neck: Normal range of motion.  Cardiovascular: Normal rate.   Respiratory: Effort normal.  Neurological: She is alert.  Skin: Skin is warm.  Psychiatric: She has a normal mood and affect.    Examination the right shoulder demonstrates some coarse grinding and crepitus with active and passive range of motion.  Supraspinatus weakness is present at 4 out of 5.  Grip EPL FPL interosseous wrist flexion and extension strength is intact.  Positive carpal tunnel  compression testing on the right with negative Tinel's in the cubital tunnel in the elbow.  There is no restriction of passive external rotation right versus left.  No acromioclavicular joint tenderness.  Impingement signs positive on the right. Assessment/Plan Impression is right shoulder rotator cuff tear.  Plan arthroscopy with rotator cuff repair subacromial decompression.  We will examine the biceps tendon at that time as well.  Risks and benefits of the shoulder surgery are discussed including not limited to infection or vessel damage shoulder stiffness  prolonged recovery along with potential need for more surgery.  Patient also has right carpal tunnel syndrome.  Plan is for surgical release.  Risks and benefits also discussed including not limited to infection or vessel damage decreased grip strength.  Patient understands the risks and benefits of both surgeries and the like to have them both done at the same time.  All questions answered.  Anderson Malta, MD 05/01/2016, 8:21 PM

## 2016-05-02 ENCOUNTER — Encounter (HOSPITAL_COMMUNITY)
Admission: RE | Disposition: A | Discharge status: Home / Self Care | Payer: Self-pay | Source: Ambulatory Visit | Attending: Orthopedic Surgery

## 2016-05-02 ENCOUNTER — Encounter (HOSPITAL_COMMUNITY): Payer: Self-pay | Admitting: Certified Registered Nurse Anesthetist

## 2016-05-02 ENCOUNTER — Ambulatory Visit (HOSPITAL_COMMUNITY): Payer: Medicare Other | Admitting: Certified Registered Nurse Anesthetist

## 2016-05-02 ENCOUNTER — Observation Stay (HOSPITAL_COMMUNITY)
Admission: RE | Admit: 2016-05-02 | Discharge: 2016-05-03 | Disposition: A | Discharge status: Home / Self Care | Payer: Medicare Other | Source: Ambulatory Visit | Attending: Orthopedic Surgery | Admitting: Orthopedic Surgery

## 2016-05-02 DIAGNOSIS — Z96651 Presence of right artificial knee joint: Secondary | ICD-10-CM | POA: Diagnosis not present

## 2016-05-02 DIAGNOSIS — M67921 Unspecified disorder of synovium and tendon, right upper arm: Secondary | ICD-10-CM | POA: Diagnosis not present

## 2016-05-02 DIAGNOSIS — F17210 Nicotine dependence, cigarettes, uncomplicated: Secondary | ICD-10-CM | POA: Diagnosis not present

## 2016-05-02 DIAGNOSIS — E785 Hyperlipidemia, unspecified: Secondary | ICD-10-CM | POA: Insufficient documentation

## 2016-05-02 DIAGNOSIS — M7521 Bicipital tendinitis, right shoulder: Secondary | ICD-10-CM | POA: Diagnosis not present

## 2016-05-02 DIAGNOSIS — G5601 Carpal tunnel syndrome, right upper limb: Secondary | ICD-10-CM | POA: Insufficient documentation

## 2016-05-02 DIAGNOSIS — M75100 Unspecified rotator cuff tear or rupture of unspecified shoulder, not specified as traumatic: Secondary | ICD-10-CM | POA: Diagnosis present

## 2016-05-02 DIAGNOSIS — G8918 Other acute postprocedural pain: Secondary | ICD-10-CM | POA: Diagnosis not present

## 2016-05-02 DIAGNOSIS — Z79899 Other long term (current) drug therapy: Secondary | ICD-10-CM | POA: Diagnosis not present

## 2016-05-02 DIAGNOSIS — M7551 Bursitis of right shoulder: Secondary | ICD-10-CM | POA: Insufficient documentation

## 2016-05-02 DIAGNOSIS — M75111 Incomplete rotator cuff tear or rupture of right shoulder, not specified as traumatic: Secondary | ICD-10-CM

## 2016-05-02 DIAGNOSIS — M75101 Unspecified rotator cuff tear or rupture of right shoulder, not specified as traumatic: Secondary | ICD-10-CM | POA: Diagnosis not present

## 2016-05-02 DIAGNOSIS — Z7951 Long term (current) use of inhaled steroids: Secondary | ICD-10-CM | POA: Insufficient documentation

## 2016-05-02 DIAGNOSIS — Z5333 Arthroscopic surgical procedure converted to open procedure: Secondary | ICD-10-CM | POA: Diagnosis not present

## 2016-05-02 DIAGNOSIS — Z880 Allergy status to penicillin: Secondary | ICD-10-CM | POA: Insufficient documentation

## 2016-05-02 DIAGNOSIS — I10 Essential (primary) hypertension: Secondary | ICD-10-CM | POA: Insufficient documentation

## 2016-05-02 DIAGNOSIS — G5602 Carpal tunnel syndrome, left upper limb: Secondary | ICD-10-CM | POA: Diagnosis not present

## 2016-05-02 HISTORY — PX: CARPAL TUNNEL RELEASE: SHX101

## 2016-05-02 HISTORY — PX: SHOULDER ARTHROSCOPY WITH SUBACROMIAL DECOMPRESSION: SHX5684

## 2016-05-02 SURGERY — SHOULDER ARTHROSCOPY WITH SUBACROMIAL DECOMPRESSION
Anesthesia: General | Site: Shoulder | Laterality: Right

## 2016-05-02 MED ORDER — DEXAMETHASONE SODIUM PHOSPHATE 10 MG/ML IJ SOLN
INTRAMUSCULAR | Status: AC
  Filled 2016-05-02: qty 1

## 2016-05-02 MED ORDER — FENTANYL CITRATE (PF) 100 MCG/2ML IJ SOLN: 25.0000 ug | INTRAMUSCULAR | Status: DC | PRN

## 2016-05-02 MED ORDER — ALBUMIN HUMAN 5 % IV SOLN
INTRAVENOUS | Status: DC | PRN
  Administered 2016-05-02: 17:00:00 via INTRAVENOUS

## 2016-05-02 MED ORDER — METHOCARBAMOL 1000 MG/10ML IJ SOLN
500.0000 mg | Freq: Four times a day (QID) | INTRAMUSCULAR | Status: DC | PRN
  Filled 2016-05-02: qty 5

## 2016-05-02 MED ORDER — SODIUM CHLORIDE 0.9 % IR SOLN
Status: DC | PRN
  Administered 2016-05-02 (×2): 3000 mL

## 2016-05-02 MED ORDER — CHLORHEXIDINE GLUCONATE 4 % EX LIQD: 60.0000 mL | Freq: Once | CUTANEOUS | Status: DC

## 2016-05-02 MED ORDER — LIDOCAINE 2% (20 MG/ML) 5 ML SYRINGE
INTRAMUSCULAR | Status: AC
  Filled 2016-05-02: qty 5

## 2016-05-02 MED ORDER — FENTANYL CITRATE (PF) 250 MCG/5ML IJ SOLN
INTRAMUSCULAR | Status: AC
  Filled 2016-05-02: qty 5

## 2016-05-02 MED ORDER — EPHEDRINE 5 MG/ML INJ
INTRAVENOUS | Status: AC
  Filled 2016-05-02: qty 20

## 2016-05-02 MED ORDER — 0.9 % SODIUM CHLORIDE (POUR BTL) OPTIME
TOPICAL | Status: DC | PRN
  Administered 2016-05-02 (×2): 1000 mL

## 2016-05-02 MED ORDER — ESMOLOL HCL 100 MG/10ML IV SOLN
INTRAVENOUS | Status: DC | PRN
  Administered 2016-05-02: 30 mg via INTRAVENOUS
  Administered 2016-05-02: 20 mg via INTRAVENOUS

## 2016-05-02 MED ORDER — PRAVASTATIN SODIUM 20 MG PO TABS: 10.0000 mg | ORAL_TABLET | Freq: Every day | ORAL | Status: DC

## 2016-05-02 MED ORDER — FENTANYL CITRATE (PF) 100 MCG/2ML IJ SOLN
INTRAMUSCULAR | Status: AC
  Administered 2016-05-02: 75 ug
  Filled 2016-05-02: qty 2

## 2016-05-02 MED ORDER — ONDANSETRON HCL 4 MG/2ML IJ SOLN: 4.0000 mg | Freq: Four times a day (QID) | INTRAMUSCULAR | Status: DC | PRN

## 2016-05-02 MED ORDER — ALBUTEROL SULFATE (2.5 MG/3ML) 0.083% IN NEBU: 3.0000 mL | INHALATION_SOLUTION | Freq: Four times a day (QID) | RESPIRATORY_TRACT | Status: DC | PRN

## 2016-05-02 MED ORDER — ROCURONIUM BROMIDE 10 MG/ML (PF) SYRINGE
PREFILLED_SYRINGE | INTRAVENOUS | Status: DC | PRN
  Administered 2016-05-02: 20 mg via INTRAVENOUS
  Administered 2016-05-02: 50 mg via INTRAVENOUS

## 2016-05-02 MED ORDER — CLINDAMYCIN PHOSPHATE 600 MG/50ML IV SOLN
600.0000 mg | Freq: Four times a day (QID) | INTRAVENOUS | Status: AC
Start: 2016-05-02 — End: 2016-05-03
  Administered 2016-05-02 – 2016-05-03 (×2): 600 mg via INTRAVENOUS
  Filled 2016-05-02 (×2): qty 50

## 2016-05-02 MED ORDER — LACTATED RINGERS IV SOLN
INTRAVENOUS | Status: DC | PRN
  Administered 2016-05-02 (×2): via INTRAVENOUS

## 2016-05-02 MED ORDER — MENTHOL 3 MG MT LOZG: 1.0000 | LOZENGE | OROMUCOSAL | Status: DC | PRN

## 2016-05-02 MED ORDER — PHENOL 1.4 % MT LIQD: 1.0000 | OROMUCOSAL | Status: DC | PRN

## 2016-05-02 MED ORDER — CLINDAMYCIN PHOSPHATE 900 MG/50ML IV SOLN
900.0000 mg | INTRAVENOUS | Status: AC
  Administered 2016-05-02: 900 mg via INTRAVENOUS

## 2016-05-02 MED ORDER — METHOCARBAMOL 500 MG PO TABS
500.0000 mg | ORAL_TABLET | Freq: Four times a day (QID) | ORAL | Status: DC | PRN
  Administered 2016-05-03: 500 mg via ORAL
  Filled 2016-05-02: qty 1

## 2016-05-02 MED ORDER — PROPOFOL 10 MG/ML IV BOLUS
INTRAVENOUS | Status: AC
  Filled 2016-05-02: qty 20

## 2016-05-02 MED ORDER — ASPIRIN 325 MG PO TABS
325.0000 mg | ORAL_TABLET | Freq: Every day | ORAL | Status: DC
  Administered 2016-05-03: 325 mg via ORAL
  Filled 2016-05-02: qty 1

## 2016-05-02 MED ORDER — PHENYLEPHRINE HCL 10 MG/ML IJ SOLN
INTRAVENOUS | Status: DC | PRN
  Administered 2016-05-02: 10 ug/min via INTRAVENOUS
  Administered 2016-05-02: 75 ug/min via INTRAVENOUS

## 2016-05-02 MED ORDER — POTASSIUM CHLORIDE IN NACL 20-0.9 MEQ/L-% IV SOLN
INTRAVENOUS | Status: AC
  Administered 2016-05-02: 1000 mL via INTRAVENOUS
  Filled 2016-05-02: qty 1000

## 2016-05-02 MED ORDER — SUGAMMADEX SODIUM 200 MG/2ML IV SOLN
INTRAVENOUS | Status: DC | PRN
  Administered 2016-05-02: 180 mg via INTRAVENOUS

## 2016-05-02 MED ORDER — MIDAZOLAM HCL 2 MG/2ML IJ SOLN
INTRAMUSCULAR | Status: AC
  Filled 2016-05-02: qty 2

## 2016-05-02 MED ORDER — EPHEDRINE SULFATE-NACL 50-0.9 MG/10ML-% IV SOSY
PREFILLED_SYRINGE | INTRAVENOUS | Status: DC | PRN
  Administered 2016-05-02: 5 mg via INTRAVENOUS
  Administered 2016-05-02: 10 mg via INTRAVENOUS
  Administered 2016-05-02: 5 mg via INTRAVENOUS

## 2016-05-02 MED ORDER — OXYCODONE HCL 5 MG PO TABS
ORAL_TABLET | ORAL | Status: AC
  Administered 2016-05-02: 5 mg via ORAL
  Filled 2016-05-02: qty 1

## 2016-05-02 MED ORDER — LIDOCAINE 2% (20 MG/ML) 5 ML SYRINGE
INTRAMUSCULAR | Status: DC | PRN
  Administered 2016-05-02: 50 mg via INTRAVENOUS
  Administered 2016-05-02: 40 mg via INTRAVENOUS

## 2016-05-02 MED ORDER — OXYCODONE HCL 5 MG PO TABS
5.0000 mg | ORAL_TABLET | ORAL | Status: DC | PRN
  Administered 2016-05-02 – 2016-05-03 (×4): 5 mg via ORAL
  Filled 2016-05-02 (×3): qty 1

## 2016-05-02 MED ORDER — ROCURONIUM BROMIDE 50 MG/5ML IV SOSY
PREFILLED_SYRINGE | INTRAVENOUS | Status: AC
  Filled 2016-05-02: qty 5

## 2016-05-02 MED ORDER — SODIUM CHLORIDE 0.9 % IJ SOLN
INTRAMUSCULAR | Status: DC | PRN
  Administered 2016-05-02: 20 mL via INTRAVENOUS
  Administered 2016-05-02: 30 mL via INTRAVENOUS

## 2016-05-02 MED ORDER — LISINOPRIL 20 MG PO TABS
20.0000 mg | ORAL_TABLET | Freq: Every day | ORAL | Status: DC
  Filled 2016-05-02 (×2): qty 1

## 2016-05-02 MED ORDER — CLINDAMYCIN PHOSPHATE 900 MG/50ML IV SOLN
INTRAVENOUS | Status: AC
  Filled 2016-05-02: qty 50

## 2016-05-02 MED ORDER — ONDANSETRON HCL 4 MG/2ML IJ SOLN
INTRAMUSCULAR | Status: DC | PRN
  Administered 2016-05-02: 4 mg via INTRAVENOUS

## 2016-05-02 MED ORDER — ONDANSETRON HCL 4 MG/2ML IJ SOLN
INTRAMUSCULAR | Status: AC
  Filled 2016-05-02: qty 2

## 2016-05-02 MED ORDER — MIDAZOLAM HCL 2 MG/2ML IJ SOLN
INTRAMUSCULAR | Status: AC
  Administered 2016-05-02: 2 mg
  Filled 2016-05-02: qty 2

## 2016-05-02 MED ORDER — FENTANYL CITRATE (PF) 100 MCG/2ML IJ SOLN
INTRAMUSCULAR | Status: DC | PRN
  Administered 2016-05-02 (×5): 50 ug via INTRAVENOUS

## 2016-05-02 MED ORDER — ACETAMINOPHEN 650 MG RE SUPP
650.0000 mg | Freq: Four times a day (QID) | RECTAL | Status: DC | PRN
Start: 2016-05-02 — End: 2016-05-03

## 2016-05-02 MED ORDER — MORPHINE SULFATE (PF) 4 MG/ML IV SOLN: 4.0000 mg | INTRAVENOUS | Status: DC | PRN

## 2016-05-02 MED ORDER — PROPOFOL 10 MG/ML IV BOLUS
INTRAVENOUS | Status: DC | PRN
  Administered 2016-05-02: 150 mg via INTRAVENOUS
  Administered 2016-05-02: 50 mg via INTRAVENOUS

## 2016-05-02 MED ORDER — ACETAMINOPHEN 325 MG PO TABS
650.0000 mg | ORAL_TABLET | Freq: Four times a day (QID) | ORAL | Status: DC | PRN
Start: 2016-05-02 — End: 2016-05-03

## 2016-05-02 MED ORDER — EPINEPHRINE PF 1 MG/ML IJ SOLN
INTRAMUSCULAR | Status: AC
  Filled 2016-05-02: qty 1

## 2016-05-02 MED ORDER — SUGAMMADEX SODIUM 200 MG/2ML IV SOLN
INTRAVENOUS | Status: AC
  Filled 2016-05-02: qty 2

## 2016-05-02 MED ORDER — EPINEPHRINE PF 1 MG/ML IJ SOLN
INTRAMUSCULAR | Status: DC | PRN
  Administered 2016-05-02: .01 mL

## 2016-05-02 MED ORDER — MOMETASONE FURO-FORMOTEROL FUM 100-5 MCG/ACT IN AERO
2.0000 | INHALATION_SPRAY | Freq: Two times a day (BID) | RESPIRATORY_TRACT | Status: DC
  Administered 2016-05-03: 2 via RESPIRATORY_TRACT
  Filled 2016-05-02: qty 8.8

## 2016-05-02 MED ORDER — ONDANSETRON HCL 4 MG PO TABS: 4.0000 mg | ORAL_TABLET | Freq: Four times a day (QID) | ORAL | Status: DC | PRN

## 2016-05-02 MED ORDER — MIDAZOLAM HCL 5 MG/5ML IJ SOLN
INTRAMUSCULAR | Status: DC | PRN
  Administered 2016-05-02 (×2): 1 mg via INTRAVENOUS

## 2016-05-02 MED ORDER — DEXAMETHASONE SODIUM PHOSPHATE 10 MG/ML IJ SOLN
INTRAMUSCULAR | Status: AC
  Filled 2016-05-02: qty 2

## 2016-05-02 MED ORDER — LACTATED RINGERS IV SOLN
INTRAVENOUS | Status: DC
  Administered 2016-05-02: 11:00:00 via INTRAVENOUS

## 2016-05-02 MED ORDER — METOCLOPRAMIDE HCL 5 MG/ML IJ SOLN: 5.0000 mg | Freq: Three times a day (TID) | INTRAMUSCULAR | Status: DC | PRN

## 2016-05-02 MED ORDER — DEXAMETHASONE SODIUM PHOSPHATE 10 MG/ML IJ SOLN
INTRAMUSCULAR | Status: DC | PRN
  Administered 2016-05-02: 10 mg via INTRAVENOUS

## 2016-05-02 MED ORDER — BUPIVACAINE HCL (PF) 0.25 % IJ SOLN
INTRAMUSCULAR | Status: AC
  Filled 2016-05-02: qty 30

## 2016-05-02 MED ORDER — ESMOLOL HCL 100 MG/10ML IV SOLN
INTRAVENOUS | Status: AC
  Filled 2016-05-02: qty 10

## 2016-05-02 MED ORDER — AMLODIPINE BESYLATE 10 MG PO TABS
10.0000 mg | ORAL_TABLET | Freq: Every day | ORAL | Status: DC
  Filled 2016-05-02 (×2): qty 1

## 2016-05-02 MED ORDER — METOCLOPRAMIDE HCL 5 MG PO TABS: 5.0000 mg | ORAL_TABLET | Freq: Three times a day (TID) | ORAL | Status: DC | PRN

## 2016-05-02 SURGICAL SUPPLY — 96 items
AID PSTN UNV HD RSTRNT DISP (MISCELLANEOUS) ×2
ANCH SUT CRKSW FT 1.3X (Anchor) ×4 IMPLANT
ANCH SUT PUSHLCK 24X4.5 STRL (Orthopedic Implant) ×4 IMPLANT
ANCH SUT SWLK 19.1X6.25 CLS (Anchor) ×2 IMPLANT
ANCHOR SUT BIOCOMP CORKSREW (Anchor) ×2 IMPLANT
ANCHOR SUT SWIVELLOK BIO (Anchor) ×1 IMPLANT
APL SKNCLS STERI-STRIP NONHPOA (GAUZE/BANDAGES/DRESSINGS) ×2
BANDAGE ACE 3X5.8 VEL STRL LF (GAUZE/BANDAGES/DRESSINGS) ×5 IMPLANT
BANDAGE ACE 4X5 VEL STRL LF (GAUZE/BANDAGES/DRESSINGS) ×3 IMPLANT
BENZOIN TINCTURE PRP APPL 2/3 (GAUZE/BANDAGES/DRESSINGS) ×3 IMPLANT
BLADE CUTTER GATOR 3.5 (BLADE) ×3 IMPLANT
BLADE GREAT WHITE 4.2 (BLADE) ×3 IMPLANT
BLADE SURG 11 STRL SS (BLADE) ×3 IMPLANT
BNDG CMPR 9X4 STRL LF SNTH (GAUZE/BANDAGES/DRESSINGS)
BNDG ESMARK 4X9 LF (GAUZE/BANDAGES/DRESSINGS) IMPLANT
BNDG GAUZE ELAST 4 BULKY (GAUZE/BANDAGES/DRESSINGS) ×3 IMPLANT
BUR OVAL 6.0 (BURR) ×3 IMPLANT
CORDS BIPOLAR (ELECTRODE) ×3 IMPLANT
COVER SURGICAL LIGHT HANDLE (MISCELLANEOUS) ×3 IMPLANT
CUFF TOURNIQUET SINGLE 18IN (TOURNIQUET CUFF) ×3 IMPLANT
CUFF TOURNIQUET SINGLE 24IN (TOURNIQUET CUFF) IMPLANT
DRAPE INCISE IOBAN 66X45 STRL (DRAPES) ×6 IMPLANT
DRAPE STERI 35X30 U-POUCH (DRAPES) ×3 IMPLANT
DRAPE SURG 17X23 STRL (DRAPES) ×3 IMPLANT
DRAPE U-SHAPE 47X51 STRL (DRAPES) ×6 IMPLANT
DRSG PAD ABDOMINAL 8X10 ST (GAUZE/BANDAGES/DRESSINGS) ×7 IMPLANT
DRSG TEGADERM 4X4.75 (GAUZE/BANDAGES/DRESSINGS) ×3 IMPLANT
DURAPREP 26ML APPLICATOR (WOUND CARE) ×3 IMPLANT
ELECT REM PT RETURN 9FT ADLT (ELECTROSURGICAL) ×3
ELECTRODE REM PT RTRN 9FT ADLT (ELECTROSURGICAL) ×2 IMPLANT
FIBERLOOP 2 0 (SUTURE) ×1 IMPLANT
FILTER STRAW FLUID ASPIR (MISCELLANEOUS) ×3 IMPLANT
GAUZE SPONGE 4X4 12PLY STRL (GAUZE/BANDAGES/DRESSINGS) ×4 IMPLANT
GAUZE XEROFORM 1X8 LF (GAUZE/BANDAGES/DRESSINGS) ×3 IMPLANT
GLOVE BIOGEL PI IND STRL 8 (GLOVE) ×2 IMPLANT
GLOVE BIOGEL PI INDICATOR 8 (GLOVE) ×1
GLOVE SURG ORTHO 8.0 STRL STRW (GLOVE) ×3 IMPLANT
GOWN STRL REUS W/ TWL LRG LVL3 (GOWN DISPOSABLE) ×6 IMPLANT
GOWN STRL REUS W/ TWL XL LVL3 (GOWN DISPOSABLE) ×2 IMPLANT
GOWN STRL REUS W/TWL LRG LVL3 (GOWN DISPOSABLE) ×9
GOWN STRL REUS W/TWL XL LVL3 (GOWN DISPOSABLE) ×3
KIT BASIN OR (CUSTOM PROCEDURE TRAY) ×3 IMPLANT
KIT ROOM TURNOVER OR (KITS) ×3 IMPLANT
LOOP VESSEL MAXI BLUE (MISCELLANEOUS) IMPLANT
MANIFOLD NEPTUNE II (INSTRUMENTS) ×3 IMPLANT
NDL 1/2 CIR MAYO (NEEDLE) IMPLANT
NDL HYPO 25GX1X1/2 BEV (NEEDLE) IMPLANT
NDL HYPO 25X1 1.5 SAFETY (NEEDLE) ×2 IMPLANT
NDL SCORPION MULTI FIRE (NEEDLE) IMPLANT
NDL SPNL 18GX3.5 QUINCKE PK (NEEDLE) ×2 IMPLANT
NDL SUT 6 .5 CRC .975X.05 MAYO (NEEDLE) ×2 IMPLANT
NEEDLE 1/2 CIR MAYO (NEEDLE) ×3 IMPLANT
NEEDLE HYPO 25GX1X1/2 BEV (NEEDLE) IMPLANT
NEEDLE HYPO 25X1 1.5 SAFETY (NEEDLE) ×3 IMPLANT
NEEDLE MAYO TAPER (NEEDLE) ×3
NEEDLE SCORPION MULTI FIRE (NEEDLE) ×3 IMPLANT
NEEDLE SPNL 18GX3.5 QUINCKE PK (NEEDLE) ×3 IMPLANT
NS IRRIG 1000ML POUR BTL (IV SOLUTION) ×3 IMPLANT
PACK ORTHO EXTREMITY (CUSTOM PROCEDURE TRAY) ×3 IMPLANT
PACK SHOULDER (CUSTOM PROCEDURE TRAY) ×3 IMPLANT
PAD ARMBOARD 7.5X6 YLW CONV (MISCELLANEOUS) ×6 IMPLANT
PAD CAST 4YDX4 CTTN HI CHSV (CAST SUPPLIES) ×4 IMPLANT
PADDING CAST COTTON 4X4 STRL (CAST SUPPLIES) ×3
PUSHLOCK PEEK 4.5X24 (Orthopedic Implant) ×4 IMPLANT
RESTRAINT HEAD UNIVERSAL NS (MISCELLANEOUS) ×3 IMPLANT
SET ARTHROSCOPY TUBING (MISCELLANEOUS) ×3
SET ARTHROSCOPY TUBING LN (MISCELLANEOUS) ×2 IMPLANT
SLING ARM IMMOBILIZER LRG (SOFTGOODS) IMPLANT
SPONGE LAP 4X18 X RAY DECT (DISPOSABLE) ×7 IMPLANT
STRIP CLOSURE SKIN 1/2X4 (GAUZE/BANDAGES/DRESSINGS) ×4 IMPLANT
SUCTION FRAZIER HANDLE 10FR (MISCELLANEOUS) ×1
SUCTION TUBE FRAZIER 10FR DISP (MISCELLANEOUS) ×2 IMPLANT
SUT ETHILON 3 0 PS 1 (SUTURE) ×3 IMPLANT
SUT FIBERWIRE #2 38 T-5 BLUE (SUTURE) ×6
SUT PROLENE 3 0 PS 2 (SUTURE) ×3 IMPLANT
SUT VIC AB 0 CT1 27 (SUTURE) ×6
SUT VIC AB 0 CT1 27XBRD ANBCTR (SUTURE) ×4 IMPLANT
SUT VIC AB 1 CT1 27 (SUTURE) ×3
SUT VIC AB 1 CT1 27XBRD ANBCTR (SUTURE) ×2 IMPLANT
SUT VIC AB 2-0 CT1 27 (SUTURE) ×3
SUT VIC AB 2-0 CT1 TAPERPNT 27 (SUTURE) ×2 IMPLANT
SUT VIC AB 3-0 FS2 27 (SUTURE) IMPLANT
SUT VICRYL 0 UR6 27IN ABS (SUTURE) ×1 IMPLANT
SUTURE FIBERWR #2 38 T-5 BLUE (SUTURE) IMPLANT
SYR 20CC LL (SYRINGE) ×6 IMPLANT
SYR 3ML LL SCALE MARK (SYRINGE) ×3 IMPLANT
SYR CONTROL 10ML LL (SYRINGE) IMPLANT
SYR TB 1ML LUER SLIP (SYRINGE) ×3 IMPLANT
SYSTEM CHEST DRAIN TLS 7FR (DRAIN) IMPLANT
TOWEL OR 17X24 6PK STRL BLUE (TOWEL DISPOSABLE) ×3 IMPLANT
TOWEL OR 17X26 10 PK STRL BLUE (TOWEL DISPOSABLE) ×3 IMPLANT
TUBE CONNECTING 12X1/4 (SUCTIONS) IMPLANT
UNDERPAD 30X30 (UNDERPADS AND DIAPERS) ×3 IMPLANT
WAND HAND CNTRL MULTIVAC 90 (MISCELLANEOUS) IMPLANT
WAND STAR VAC 90 (SURGICAL WAND) ×1 IMPLANT
WATER STERILE IRR 1000ML POUR (IV SOLUTION) ×3 IMPLANT

## 2016-05-02 NOTE — Anesthesia Preprocedure Evaluation (Signed)
Anesthesia Evaluation  Patient identified by MRN, date of birth, ID band Patient awake    Reviewed: Allergy & Precautions, NPO status , Patient's Chart, lab work & pertinent test results  Airway Mallampati: II  TM Distance: >3 FB     Dental   Pulmonary neg pulmonary ROS, Current Smoker,    breath sounds clear to auscultation       Cardiovascular hypertension,  Rhythm:Regular Rate:Normal     Neuro/Psych    GI/Hepatic Neg liver ROS,   Endo/Other  negative endocrine ROS  Renal/GU negative Renal ROS     Musculoskeletal  (+) Arthritis ,   Abdominal   Peds  Hematology   Anesthesia Other Findings   Reproductive/Obstetrics                             Anesthesia Physical Anesthesia Plan  ASA: III  Anesthesia Plan: General   Post-op Pain Management:  Regional for Post-op pain   Induction: Intravenous  Airway Management Planned: Oral ETT  Additional Equipment:   Intra-op Plan:   Post-operative Plan: Possible Post-op intubation/ventilation  Informed Consent: I have reviewed the patients History and Physical, chart, labs and discussed the procedure including the risks, benefits and alternatives for the proposed anesthesia with the patient or authorized representative who has indicated his/her understanding and acceptance.   Dental advisory given  Plan Discussed with: CRNA and Anesthesiologist  Anesthesia Plan Comments:         Anesthesia Quick Evaluation

## 2016-05-02 NOTE — Progress Notes (Signed)
Patient has been notified of delay of start time by 1.5 - 2 hrs.

## 2016-05-02 NOTE — Anesthesia Procedure Notes (Signed)
Anesthesia Regional Block: Interscalene brachial plexus block   Pre-Anesthetic Checklist: ,, timeout performed, Correct Patient, Correct Site, Correct Laterality, Correct Procedure, Correct Position, site marked, Risks and benefits discussed,  Surgical consent,  Pre-op evaluation,  At surgeon's request and post-op pain management  Laterality: Right  Prep: chloraprep       Needles:   Needle Type: Stimulator Needle - 40          Additional Needles:   Procedures: Doppler guided, Ultrasound guided, Nerve stimulator,,,,   Nerve Stimulator or Paresthesia:  Response: 0.5 mA,   Additional Responses:   Narrative:  Start time: 05/02/2016 2:45 PM End time: 05/02/2016 3:00 PM Injection made incrementally with aspirations every 5 mL.  Performed by: Personally  Anesthesiologist: Belinda Block

## 2016-05-02 NOTE — Anesthesia Postprocedure Evaluation (Signed)
Anesthesia Post Note  Patient: Alison Ford  Procedure(s) Performed: Procedure(s) (LRB): RIGHT SHOULDER ARTHROSCOPY WITH SUBACROMIAL DECOMPRESSION, MINI OPEN ROTATOR CUFF TEAR REPAIR, AND RIGHT CARPAL TUNNEL RELEASE (Right) CARPAL TUNNEL RELEASE (Right)  Patient location during evaluation: PACU Anesthesia Type: General and Regional Level of consciousness: awake Pain management: pain level controlled Vital Signs Assessment: post-procedure vital signs reviewed and stable Respiratory status: spontaneous breathing Cardiovascular status: stable Anesthetic complications: no       Last Vitals:  Vitals:   05/02/16 1820 05/02/16 1830  BP: 123/72   Pulse: 83 77  Resp: (!) 25 17  Temp: 36.4 C     Last Pain:  Vitals:   05/02/16 1830  PainSc: 0-No pain                 Jennine Peddy

## 2016-05-02 NOTE — Anesthesia Procedure Notes (Signed)
Procedure Name: Intubation Date/Time: 05/02/2016 3:15 PM Performed by: Everlean Cherry A Pre-anesthesia Checklist: Patient identified, Emergency Drugs available, Suction available and Patient being monitored Patient Re-evaluated:Patient Re-evaluated prior to inductionOxygen Delivery Method: Circle system utilized Preoxygenation: Pre-oxygenation with 100% oxygen Intubation Type: IV induction Ventilation: Mask ventilation without difficulty Laryngoscope Size: Miller and 2 Grade View: Grade I Tube type: Oral Tube size: 7.5 mm Number of attempts: 1 Airway Equipment and Method: Stylet Placement Confirmation: ETT inserted through vocal cords under direct vision,  positive ETCO2 and breath sounds checked- equal and bilateral Secured at: 23 cm Tube secured with: Tape Dental Injury: Teeth and Oropharynx as per pre-operative assessment

## 2016-05-02 NOTE — Brief Op Note (Signed)
05/02/2016  6:18 PM  PATIENT:  Pamala Hurry A Ramsay  61 y.o. female  PRE-OPERATIVE DIAGNOSIS:  RIGHT SHOULDER ROTATOR CUFF TEAR, RIGHT CARPAL TUNNEL SYNDROME  POST-OPERATIVE DIAGNOSIS:  RIGHT SHOULDER ROTATOR CUFF TEAR, biceps tendinitis RIGHT CARPAL TUNNEL SYNDROME  PROCEDURE:  Procedure(s): RIGHT SHOULDER ARTHROSCOPY WITH SUBACROMIAL DECOMPRESSION, MINI OPEN ROTATOR CUFF TEAR REPAIR and biceps tenodesis, AND RIGHT CARPAL TUNNEL RELEASE   SURGEON:  Surgeon(s): Meredith Pel, MD  ASSISTANT: Laure Kidney rnfa  ANESTHESIA:   general  EBL: 50 ml    Total I/O In: 1250 [I.V.:1000; IV Piggyback:250] Out: 15 [Blood:15]  BLOOD ADMINISTERED: none  DRAINS: none   LOCAL MEDICATIONS USED:  none  SPECIMEN:  No Specimen  COUNTS:  YES  TOURNIQUET:  * Missing tourniquet times found for documented tourniquets in log:  [id number] *11 minutes at 250 mmHg for the right wrist  DICTATION: .Other Dictation: Dictation Number [id number]  PLAN OF CARE: Admit for overnight observation  PATIENT DISPOSITION:  PACU - hemodynamically stable

## 2016-05-02 NOTE — Transfer of Care (Signed)
Immediate Anesthesia Transfer of Care Note  Patient: Alison Ford  Procedure(s) Performed: Procedure(s): RIGHT SHOULDER ARTHROSCOPY WITH SUBACROMIAL DECOMPRESSION, MINI OPEN ROTATOR CUFF TEAR REPAIR, AND RIGHT CARPAL TUNNEL RELEASE (Right) CARPAL TUNNEL RELEASE (Right)  Patient Location: PACU  Anesthesia Type:General and GA combined with regional for post-op pain  Level of Consciousness: awake, alert  and patient cooperative  Airway & Oxygen Therapy: Patient Spontanous Breathing  Post-op Assessment: Report given to RN and Post -op Vital signs reviewed and stable  Post vital signs: Reviewed and stable  Last Vitals:  Vitals:   05/02/16 1130 05/02/16 1450  BP: (!) 133/100 (!) 158/58  Resp: 18   Temp: 37 C     Last Pain: There were no vitals filed for this visit.       Complications: No apparent anesthesia complications

## 2016-05-03 ENCOUNTER — Encounter (HOSPITAL_COMMUNITY): Payer: Self-pay

## 2016-05-03 DIAGNOSIS — E785 Hyperlipidemia, unspecified: Secondary | ICD-10-CM | POA: Diagnosis not present

## 2016-05-03 DIAGNOSIS — M75101 Unspecified rotator cuff tear or rupture of right shoulder, not specified as traumatic: Secondary | ICD-10-CM | POA: Diagnosis not present

## 2016-05-03 DIAGNOSIS — M7551 Bursitis of right shoulder: Secondary | ICD-10-CM | POA: Diagnosis not present

## 2016-05-03 DIAGNOSIS — I10 Essential (primary) hypertension: Secondary | ICD-10-CM | POA: Diagnosis not present

## 2016-05-03 DIAGNOSIS — G5601 Carpal tunnel syndrome, right upper limb: Secondary | ICD-10-CM | POA: Diagnosis not present

## 2016-05-03 DIAGNOSIS — M7521 Bicipital tendinitis, right shoulder: Secondary | ICD-10-CM | POA: Diagnosis not present

## 2016-05-03 NOTE — Op Note (Signed)
NAME:  Ford, Alison                   ACCOUNT NO.:  MEDICAL RECORD NO.:  [id number]  LOCATION:                                 FACILITY:  PHYSICIAN:  Anderson Malta, M.D.    DATE OF BIRTH:  05-04-1955  DATE OF PROCEDURE: DATE OF DISCHARGE:                              OPERATIVE REPORT   PREOPERATIVE DIAGNOSES:  Right shoulder rotator cuff tear, biceps tendinopathy, bursitis, and carpal tunnel syndrome.  POSTOPERATIVE DIAGNOSES:  Right shoulder rotator cuff tear, biceps tendinopathy, bursitis, and carpal tunnel syndrome.  PROCEDURE: 1. Right shoulder arthroscopy with limited debridement of superior     labrum and release of the biceps tendon. 2. Arthroscopic subacromial decompression with acromioplasty. 3. Mini open rotator cuff tear of a 2 x 2 cm U-shaped rotator cuff     tendon tear with biceps tenodesis. 4. Right carpal tunnel release.  SURGEON:  Anderson Malta, M.D.  ASSISTANT:  Laure Kidney, RNFA.  INDICATIONS:  Alison Ford is a patient with right shoulder pain as well as right carpal tunnel syndrome, who presents for operative management after explanation of risks and benefits.  MRI scan is consistent with retracted rotator cuff tear without much atrophy as well as significant biceps tendinopathy.  OPERATIVE FINDINGS: 1. Examination under anesthesia range of motion; full passive range of     motion is present, 180 forward flexion, external rotation of 15     degrees, abduction is 70 degrees.  Isolated glenohumeral abduction     is 110.  Shoulder stability is excellent to anterior and posterior     stress. 2. Diagnostic and operative arthroscopy:     a.     Significant severe tendinopathy of the biceps tendon.     b.     Early grade 1 to 2 arthritis on 50% of the contact surface      area of the humeral head with no corresponding changes on the      glenoid.     c.     Rotator cuff tear, supraspinatus, U-shaped, 2 x 2 cm.  PROCEDURE IN DETAIL:  The patient was  brought to the operating room where general anesthetic was induced.  Preoperative antibiotics were administered.  Time-out was called.  The patient was placed in the beach chair position with the head in neutral position.  Right arm, hand, and shoulder were prescrubbed with alcohol and Betadine, allowed to air dry, prepped with DuraPrep solution and draped in sterile manner.  Charlie Pitter was used to cover the operative field including the axilla.  Posterior portal was created 2 cm medial and inferior to the posterolateral margin of the acromion.  Diagnostic arthroscopy was performed.  Rotator cuff tear was identified.  Some glenohumeral arthritis was identified on the humeral head surface.  The patient had significant biceps tendinopathy. Biceps tendon was released.  The superior labrum was debrided. Synovitis in the superior aspect of the shoulder was also debrided. This was done after placement of an anterior portal.  At this point, the scope was placed in the subacromial space.  Lateral portal was created and bursectomy was performed and subacromial decompression was performed.  At this time, instruments  were removed.  The 3 portals were closed using 3-0 nylon.  Charlie Pitter was then used to cover the planned incision site for the rotator cuff repair.  About a 2 inch incision was made off the anterolateral margin of the acromion.  Skin and subcutaneous tissue were sharply divided.  Deltoid split and measured distance of about 4 cm was marked with a #1 Vicryl suture.  The bursectomy was completed.  Subacromial decompression adequate.  Rotator cuff tear identified.  Biceps tendon was tenodesed first by opening up the biceps tendon sheath at the anterior aspect of the humerus.  The tendon, which was very flattened and had a lot of tenodesis was then trimmed, tapered, and 2-0 FiberWire suture was placed.  The tendon was then secured into the bicipital groove using a SwiveLock.  This gave very good  fixation and appropriate tension.  At this time, attention was directed toward the rotator cuff tear.  The footprint was scraped with a curette.  The tendon edges were debrided.  This was a U-shaped tear. Two corkscrews were then placed at the articular surface margin and footprint margin.  The U shape was then closed with inverted 2-0 FiberWire sutures.  The suture tapes 4 from each corkscrew were then placed and a watertight repair was achieved.  The suture limbs were then tacked down to the humerus using PushLocks.  Following this, thorough irrigation was performed.  Deltoid split closed using #1 Vicryl suture followed by interrupted inverted 0 Vicryl suture, 2-0 Vicryl suture, and 3-0 Monocryl.  Waterproof dressings were then placed over the incisions. At this time, direction was turned towards the right hand.  The hand was then re-prepped with DuraPrep.  The arm tourniquet was placed, it was elevated for 11 minutes.  Incision was made at the intersection of Floyd cardinal line in the radial border of the fourth finger.  This was taken down to the wrist flexion crease.  Skin and subcutaneous tissue were sharply divided.  Palmar fascia was encountered and divided. Transverse carpal ligament was then visualized and divided 2 to 3 mm longitudinally along its mid section.  Right angle retractor was then placed between the transverse carpal ligament and the radial and median nerve.  It was then divided distally to the neurovascular bundle and proximally to the forearm fascia under direct visualization.  Motor branch intact.  Thorough irrigation performed.  Tourniquet released. Bleeding points encountered and controlled using bipolar electrocautery. Skin closed using 3-0 nylon sutures.  Bulky splint placed.  The patient tolerated the procedure well without immediate complications, placed in a shoulder sling, and transferred to recovery room in stable condition.     Anderson Malta,  M.D.     GSD/MEDQ  D:  05/02/2016  T:  05/03/2016  Job:  [id number]

## 2016-05-03 NOTE — Progress Notes (Signed)
Subjective: Patient stable.  Pain control.  Block was wearing off   Objective: Vital signs in last 24 hours: Temp:  [97.5 F (36.4 C)-98.7 F (37.1 C)] 98.7 F (37.1 C) (04/18 0613) Pulse Rate:  [64-83] 72 (04/18 0613) Resp:  [13-25] 18 (04/18 0613) BP: (103-158)/(58-100) 149/81 (04/18 0613) SpO2:  [91 %-100 %] 93 % (04/18 3832)  Intake/Output from previous day: 04/17 0701 - 04/18 0700 In: 2362.5 [P.O.:240; I.V.:1822.5; IV Piggyback:300] Out: 15 [Blood:15] Intake/Output this shift: No intake/output data recorded.  Exam:  Compartment soft  Labs: No results for input(s): HGB in the last 72 hours. No results for input(s): WBC, RBC, HCT, PLT in the last 72 hours. No results for input(s): NA, K, CL, CO2, BUN, CREATININE, GLUCOSE, CALCIUM in the last 72 hours. No results for input(s): LABPT, INR in the last 72 hours.  Assessment/Plan: Plan at this time is to discharge after patient was seen by occupational therapy.  I do want her to do passive range of motion only with that right arm.  No active range of motion or active assisted range of motion but because of her insurance situation she will need to learn a home exercise program of passive range of motion only   [employer] 05/03/2016, 7:26 AM

## 2016-05-03 NOTE — Progress Notes (Signed)
Discharge instructions (including medications) discussed with and copy provided to patient/caregiver 

## 2016-05-03 NOTE — Evaluation (Signed)
Occupational Therapy Evaluation Patient Details Name: Alison Ford MRN: [id number] DOB: [birth year] Today's Date: 05/03/2016    History of Present Illness 61 yo female s/p R TSA with carpal tunnel release   Clinical Impression   Patient evaluated by Occupational Therapy with no further acute OT needs identified. All education has been completed and the patient has no further questions. See below for any follow-up Occupational Therapy or equipment needs. OT to sign off. Thank you for referral.      Follow Up Recommendations  No OT follow up    Equipment Recommendations  None recommended by OT    Recommendations for Other Services       Precautions / Restrictions Precautions Precautions: Shoulder Type of Shoulder Precautions: passive Shoulder Interventions: Off for dressing/bathing/exercises      Mobility Bed Mobility Overal bed mobility: Independent                Transfers Overall transfer level: Independent                    Balance                                           ADL either performed or assessed with clinical judgement   ADL Overall ADL's : Needs assistance/impaired Eating/Feeding: Independent   Grooming: Wash/dry hands   Upper Body Bathing: Minimal assistance   Lower Body Bathing: Minimal assistance           Toilet Transfer: Supervision/safety           Functional mobility during ADLs: Supervision/safety General ADL Comments: pt dressed during sesison and needed (A) for bra and pants due to hooks/ buttons. Friend present for all education and return demo all exercises  Pt educated on bathing and avoid washing directly on incision. Pt educated to use new wash cloth and towel each day. Pt educated to allow water to run across dressing and not to soak in a tub at this time. Pt advised RN will instruct on any bandages required otherwise is open to air.      Vision Baseline Vision/History: No visual  deficits       Perception     Praxis      Pertinent Vitals/Pain Pain Assessment: Faces Faces Pain Scale: Hurts even more Pain Location: R shoulder Pain Descriptors / Indicators: Operative site guarding Pain Intervention(s): Monitored during session;Premedicated before session;Repositioned;Ice applied     Hand Dominance Right   Extremity/Trunk Assessment     Lower Extremity Assessment Lower Extremity Assessment: Overall WFL for tasks assessed   Cervical / Trunk Assessment Cervical / Trunk Assessment: Normal   Communication Communication Communication: No difficulties   Cognition Arousal/Alertness: Awake/alert Behavior During Therapy: WFL for tasks assessed/performed Overall Cognitive Status: Within Functional Limits for tasks assessed                                     General Comments       Exercises Exercises: Shoulder Shoulder Exercises Pendulum Exercise: PROM;Right;10 reps;Standing Shoulder Flexion: AROM;Right;10 reps;Seated Shoulder ABduction: PROM;Right;10 reps;Seated Shoulder External Rotation: PROM;Right;10 reps;Seated Digit Composite Flexion: AROM;Right;10 reps;Seated Donning/doffing shirt without moving shoulder: Supervision/safety Method for sponge bathing under operated UE: Supervision/safety Donning/doffing sling/immobilizer: Supervision/safety Correct positioning of sling/immobilizer: Independent Pendulum exercises (written home exercise program): Independent ROM  for elbow, wrist and digits of operated UE: Independent Sling wearing schedule (on at all times/off for ADL's): Independent Dressing change: Independent Positioning of UE while sleeping: Independent   Shoulder Instructions Shoulder Instructions Donning/doffing shirt without moving shoulder: Supervision/safety Method for sponge bathing under operated UE: Supervision/safety Donning/doffing sling/immobilizer: Supervision/safety Correct positioning of sling/immobilizer:  Independent Pendulum exercises (written home exercise program): Independent ROM for elbow, wrist and digits of operated UE: Independent Sling wearing schedule (on at all times/off for ADL's): Independent Dressing change: Independent Positioning of UE while sleeping: [place] expects to be discharged to:: Private residence Living Arrangements: Spouse/significant other Available Help at Discharge: Family Type of Home: House Home Access: Level entry     [place]: One level     Bathroom Shower/Tub: Teacher, early years/pre: [place]: None          Prior Functioning/Environment Level of Independence: Independent                 OT Problem List:        OT Treatment/Interventions:      OT Goals(Current goals can be found in the care plan section)    OT Frequency:     Barriers to D/C:            Co-evaluation              End of Session Nurse Communication: Mobility status  Activity Tolerance: Patient tolerated treatment well Patient left: Other (comment) (up in room walking)  OT Visit Diagnosis: Unsteadiness on feet (R26.81)                Time: 4142-3953 OT Time Calculation (min): 33 min Charges:  OT General Charges $OT Visit: 1 Procedure OT Evaluation $OT Eval Moderate Complexity: 1 Procedure OT Treatments $Self Care/Home Management : 8-22 mins G-Codes: OT G-codes **NOT FOR INPATIENT CLASS** Functional Assessment Tool Used: Clinical judgement Functional Limitation: Self care Self Care Current Status (U0233): At least 1 percent but less than 20 percent impaired, limited or restricted Self Care Goal Status (I3568): At least 1 percent but less than 20 percent impaired, limited or restricted Self Care Discharge Status [phone]): At least 1 percent but less than 20 percent impaired, limited or restricted    Jeri Modena   OTR/L Pager: [phone] Office: [phone] .   Parke Poisson  B 05/03/2016, 3:55 PM

## 2016-05-05 ENCOUNTER — Telehealth (INDEPENDENT_AMBULATORY_CARE_PROVIDER_SITE_OTHER): Payer: Self-pay | Admitting: Radiology

## 2016-05-05 MED ORDER — TIZANIDINE HCL 4 MG PO TABS: 4.0000 mg | ORAL_TABLET | Freq: Four times a day (QID) | ORAL | 0 refills | Status: DC | PRN

## 2016-05-05 NOTE — Telephone Encounter (Signed)
I called pharmacy. Per pharmacist, patient picked up rx yesterday. No prior auth was needed.

## 2016-05-05 NOTE — Telephone Encounter (Signed)
I called and spoke with Potter on [employer]. We received a fax to do a prior auth on oxycodone 5mg , and another fax wanting to change rx from robaxin to tizanidine, since insurance does not cover. Dr. Sharol Given did not write these prescriptions from his recollection. Patient is 3 days post op from shoulder scope with Dr. Marlou Sa. Dr. Sharol Given said it would be ok for patient to have tizanidine. I did send rx for tizanidine to [facility]. I asked pharmacy to send in original prescription for oxycodone. So we know who to do the prior authorization under.

## 2016-05-08 ENCOUNTER — Encounter (INDEPENDENT_AMBULATORY_CARE_PROVIDER_SITE_OTHER): Payer: Self-pay | Admitting: Orthopedic Surgery

## 2016-05-08 ENCOUNTER — Ambulatory Visit (INDEPENDENT_AMBULATORY_CARE_PROVIDER_SITE_OTHER): Payer: Medicare Other | Admitting: Orthopedic Surgery

## 2016-05-08 DIAGNOSIS — M75101 Unspecified rotator cuff tear or rupture of right shoulder, not specified as traumatic: Secondary | ICD-10-CM

## 2016-05-08 NOTE — Discharge Summary (Signed)
Physician Discharge Summary  Patient ID: Alison Ford MRN: [id number] DOB/AGE: 08-07-1955 61 y.o.  Admit date: 05/02/2016 Discharge date: 05/03/2016  Admission Diagnoses:  Active Problems:   Rotator cuff tear   Discharge Diagnoses:  Same  Surgeries: Procedure(s): RIGHT SHOULDER ARTHROSCOPY WITH SUBACROMIAL DECOMPRESSION, MINI OPEN ROTATOR CUFF TEAR REPAIR, AND RIGHT CARPAL TUNNEL RELEASE CARPAL TUNNEL RELEASE on 05/02/2016   Consultants:   Discharged Condition: Stable  Hospital Course: Alison Ford is an 61 y.o. female who was admitted 05/02/2016 with a chief complaint of shoulder pain and hand numbness on the right, and found to have a diagnosis of right shoulder rotator cuff tear and carpal tunnel syndrome.  They were brought to the operating room on 05/02/2016 and underwent the above named procedures.  She tolerated the procedure well.  She was seen by occupational therapy in the hospital on postop day #1 to initiate passive range of motion exercises for the shoulder.  She will follow up with me in 1 week for evaluation.  Discharged home in good condition  Antibiotics given:  Anti-infectives    Start     Dose/Rate Route Frequency Ordered Stop   05/02/16 2130  clindamycin (CLEOCIN) IVPB 600 mg     600 mg 100 mL/hr over 30 Minutes Intravenous Every 6 hours 05/02/16 2039 05/03/16 0505   05/02/16 1112  clindamycin (CLEOCIN) 900 MG/50ML IVPB    Comments:  Starleen Arms   : cabinet override      05/02/16 1112 05/02/16 1520   05/02/16 1105  clindamycin (CLEOCIN) IVPB 900 mg     900 mg 100 mL/hr over 30 Minutes Intravenous On call to O.R. 05/02/16 1105 05/02/16 1550    .  Recent vital signs:  Vitals:   05/03/16 0025 05/03/16 0613  BP: 110/60 (!) 149/81  Pulse: 70 72  Resp: 18 18  Temp: 97.5 F (36.4 C) 98.7 F (37.1 C)    Recent laboratory studies:  Results for orders placed or performed during the hospital encounter of 61/41/28  Basic metabolic panel  Result  Value Ref Range   Sodium 139 135 - 145 mmol/L   Potassium 4.1 3.5 - 5.1 mmol/L   Chloride 108 101 - 111 mmol/L   CO2 24 22 - 32 mmol/L   Glucose, Bld 91 65 - 99 mg/dL   BUN 12 6 - 20 mg/dL   Creatinine, Ser 0.64 0.44 - 1.00 mg/dL   Calcium 9.3 8.9 - 10.3 mg/dL   GFR calc non Af Amer >60 >60 mL/min   GFR calc Af Amer >60 >60 mL/min   Anion gap 7 5 - 15  CBC  Result Value Ref Range   WBC 6.1 4.0 - 10.5 K/uL   RBC 4.44 3.87 - 5.11 MIL/uL   Hemoglobin 13.7 12.0 - 15.0 g/dL   HCT 41.2 36.0 - 46.0 %   MCV 92.8 78.0 - 100.0 fL   MCH 30.9 26.0 - 34.0 pg   MCHC 33.3 30.0 - 36.0 g/dL   RDW 12.1 11.5 - 15.5 %   Platelets 237 150 - 400 K/uL    Discharge Medications:   Allergies as of 05/03/2016      Reactions   Penicillins Itching, Other (See Comments)   Burning and whelps Has patient had a PCN reaction causing immediate rash, facial/tongue/throat swelling, SOB or lightheadedness with hypotension: NO Has patient had a PCN reaction causing severe rash involving mucus membranes or skin necrosis: NO Has patient had a PCN reaction that required hospitalization: NO  Has patient had a PCN reaction occurring within the last 10 years: NO If all of the above answers are "NO", then may proceed with Cephalosporin use.      Medication List    STOP taking these medications   acetaminophen-codeine 300-30 MG tablet Commonly known as:  TYLENOL #3   saccharomyces boulardii 250 MG capsule Commonly known as:  FLORASTOR     TAKE these medications   amLODipine 10 MG tablet Commonly known as:  NORVASC Take 1 tablet (10 mg total) by mouth daily.   benzonatate 100 MG capsule Commonly known as:  TESSALON Take 1 capsule (100 mg total) by mouth every 8 (eight) hours.   clotrimazole-betamethasone cream Commonly known as:  LOTRISONE Apply to affected area as directed What changed:  how much to take  how to take this  when to take this  reasons to take this  additional instructions    Fluticasone-Salmeterol 100-50 MCG/DOSE Aepb Commonly known as:  ADVAIR Inhale 1 puff into the lungs 2 (two) times daily.   lisinopril 20 MG tablet Commonly known as:  PRINIVIL,ZESTRIL Take 1 tablet (20 mg total) by mouth daily.   lovastatin 40 MG tablet Commonly known as:  MEVACOR Take 1 tablet (40 mg total) by mouth at bedtime.   polyethylene glycol powder powder Commonly known as:  GLYCOLAX/MIRALAX Take 17 g by mouth daily as needed for constipation.   tretinoin 0.01 % gel Commonly known as:  RETIN-A Apply topically at bedtime. What changed:  how much to take  when to take this  reasons to take this   triamcinolone cream 0.1 % Commonly known as:  KENALOG APPLY 1 APPLICATION TOPICALLY 2 TIMES DAILY. What changed:  how much to take  how to take this  when to take this  reasons to take this  additional instructions   VENTOLIN HFA 108 (90 Base) MCG/ACT inhaler Generic drug:  albuterol Inhale 2 puffs into the lungs every 6 (six) hours as needed for wheezing or shortness of breath.       Diagnostic Studies: No results found.  Disposition: 01-Home or Self Care  Discharge Instructions    Call MD / Call 911    Complete by:  As directed    If you experience chest pain or shortness of breath, CALL 911 and be transported to the hospital emergency room.  If you develope a fever above 101 F, pus (white drainage) or increased drainage or redness at the wound, or calf pain, call your surgeon's office.   Constipation Prevention    Complete by:  As directed    Drink plenty of fluids.  Prune juice may be helpful.  You may use a stool softener, such as Colace (over the counter) 100 mg twice a day.  Use MiraLax (over the counter) for constipation as needed.   Diet - low sodium heart healthy    Complete by:  As directed    Discharge instructions    Complete by:  As directed    Okay to shower with shoulder dressings in place but do not get the hand splint wet Okay for  passive range of motion only with the right shoulder.  Otherwise remain in the sling No lifting of the right arm. Okay for elbow range of motion but no active shoulder range of motion until return clinic visit   Increase activity slowly as tolerated    Complete by:  As directed          Signed: Anderson Malta  05/08/2016, 5:56 PM

## 2016-05-08 NOTE — Progress Notes (Signed)
Post-Op Visit Note   Patient: Alison Ford           Date of Birth: 07-27-1955           MRN: [id number] Visit Date: 05/08/2016 PCP: Maren Reamer, MD   Assessment & Plan:  Chief Complaint:  Chief Complaint  Patient presents with  . Right Shoulder - Routine Post Op   Visit Diagnoses:  1. Tear of right rotator cuff, unspecified tear extent     Plan: Alison Ford is a 61 year old patient who is now a week out right shoulder arthroscopy with rotator cuff repair as well as right carpal tunnel release on exam and incision is intact shoulder incision intact portal sutures removed and the Band-Aid and Ace wrap over the hand findings okay to move the fingers and wrist but no lifting with that right hand.  In regards to the shoulder the deltoid fires and 1 to have her come out of the sling and just walk letting allowing the arm to move but I don't want her doing any lifting with that right arm.  We'll start her on some range of motion exercises next week.  She does not have a CPM machine because of her insurance.  We will have to be judicious with her use of physical therapy visits which are also limited because of her insurance  Follow-Up Instructions: Return in about 1 week (around 05/15/2016).   Orders:  No orders of the defined types were placed in this encounter.  No orders of the defined types were placed in this encounter.   Imaging: No results found.  PMFS History: Patient Active Problem List   Diagnosis Date Noted  . Rotator cuff tear 05/02/2016  . Rotator cuff tendonitis, right 12/30/2015  . Cervicalgia 12/30/2015  . Eczema, dyshidrotic 11/18/2014  . Sebaceous cyst 11/18/2014  . Acne 11/18/2014  . Hyperpigmented skin lesion 11/18/2014  . Smoker 06/01/2014  . Orthostatic hypotension 06/01/2014  . Cellulitis and abscess of trunk 03/17/2014  . Right knee DJD   . Hyperlipidemia   . Abnormal EKG 06/05/2012  . HTN (hypertension) 06/05/2012   Past Medical History:    Diagnosis Date  . Abnormal facial hair   . Back pain    d/t knee pain  . Headache(784.0)   . Heart murmur    slight  . HTN (hypertension)    takes Amlodipine daily and HCTZ   . Hyperlipidemia    takes Lovastatin nightly  . Joint pain   . Right knee DJD   . Weakness    left hand    Family History  Problem Relation Age of Onset  . CAD Father 6  . CAD Mother 7  . Hypertension Mother     Past Surgical History:  Procedure Laterality Date  . ABDOMINAL HYSTERECTOMY    . Athroscopic knee surgery     Bilateral  . CHOLECYSTECTOMY    . DILATION AND CURETTAGE OF UTERUS    . EYE SURGERY Right   . TOTAL KNEE ARTHROPLASTY Right 07/15/2012   Procedure: RIGHT TOTAL KNEE ARTHROPLASTY;  Surgeon: Lorn Junes, MD;  Location: [facility];  Service: Orthopedics;  Laterality: Right;   Social History   Occupational History  .  [employer]   Social History Main Topics  . Smoking status: Current Every Day Smoker    Packs/day: 0.50  . Smokeless tobacco: Never Used     Comment: Smoking 3-4 cigs/day  . Alcohol use 0.0 oz/week     Comment: ocassionally  .  Drug use: No  . Sexual activity: No

## 2016-05-10 ENCOUNTER — Encounter (HOSPITAL_COMMUNITY): Payer: Self-pay | Admitting: Orthopedic Surgery

## 2016-05-15 ENCOUNTER — Ambulatory Visit (INDEPENDENT_AMBULATORY_CARE_PROVIDER_SITE_OTHER): Payer: Medicare Other | Admitting: Orthopedic Surgery

## 2016-05-15 ENCOUNTER — Encounter (INDEPENDENT_AMBULATORY_CARE_PROVIDER_SITE_OTHER): Payer: Self-pay | Admitting: Orthopedic Surgery

## 2016-05-15 DIAGNOSIS — M7581 Other shoulder lesions, right shoulder: Secondary | ICD-10-CM

## 2016-05-15 MED ORDER — OXYCODONE HCL 5 MG PO TABS: 5.0000 mg | ORAL_TABLET | Freq: Two times a day (BID) | ORAL | 0 refills | Status: DC | PRN

## 2016-05-15 NOTE — Progress Notes (Signed)
Post-Op Visit Note   Patient: Alison Ford           Date of Birth: 06-30-1955           MRN: [id number] Visit Date: 05/15/2016 PCP: Maren Reamer, MD   Assessment & Plan:  Chief Complaint:  Chief Complaint  Patient presents with  . Right Shoulder - Routine Post Op, Follow-up  . Right Wrist - Routine Post Op, Follow-up   Visit Diagnoses:  1. Rotator cuff tendonitis, right     Plan: Alison Ford is a 61 year old patient 2 weeks out right carpal tunnel release right shoulder rotator cuff tear repair.  On exam hand incision looks good.  Sutures removed.  Motor sensory function in the hand is intact.  Passive range of motion of the shoulder is good.  Incision looks intact.  Patient needs therapy to start increasing her range of motion will see her back in 4 weeks' pain medicine refilled  Follow-Up Instructions: No Follow-up on file.   Orders:  No orders of the defined types were placed in this encounter.  No orders of the defined types were placed in this encounter.   Imaging: No results found.  PMFS History: Patient Active Problem List   Diagnosis Date Noted  . Rotator cuff tear 05/02/2016  . Rotator cuff tendonitis, right 12/30/2015  . Cervicalgia 12/30/2015  . Eczema, dyshidrotic 11/18/2014  . Sebaceous cyst 11/18/2014  . Acne 11/18/2014  . Hyperpigmented skin lesion 11/18/2014  . Smoker 06/01/2014  . Orthostatic hypotension 06/01/2014  . Cellulitis and abscess of trunk 03/17/2014  . Right knee DJD   . Hyperlipidemia   . Abnormal EKG 06/05/2012  . HTN (hypertension) 06/05/2012   Past Medical History:  Diagnosis Date  . Abnormal facial hair   . Back pain    d/t knee pain  . Headache(784.0)   . Heart murmur    slight  . HTN (hypertension)    takes Amlodipine daily and HCTZ   . Hyperlipidemia    takes Lovastatin nightly  . Joint pain   . Right knee DJD   . Weakness    left hand    Family History  Problem Relation Age of Onset  . CAD Father 58  .  CAD Mother 19  . Hypertension Mother     Past Surgical History:  Procedure Laterality Date  . ABDOMINAL HYSTERECTOMY    . Athroscopic knee surgery     Bilateral  . CARPAL TUNNEL RELEASE Right 05/02/2016   Procedure: CARPAL TUNNEL RELEASE;  Surgeon: Meredith Pel, MD;  Location: [facility];  Service: Orthopedics;  Laterality: Right;  . CHOLECYSTECTOMY    . DILATION AND CURETTAGE OF UTERUS    . EYE SURGERY Right   . SHOULDER ARTHROSCOPY WITH SUBACROMIAL DECOMPRESSION Right 05/02/2016   Procedure: RIGHT SHOULDER ARTHROSCOPY WITH SUBACROMIAL DECOMPRESSION, MINI OPEN ROTATOR CUFF TEAR REPAIR, AND RIGHT CARPAL TUNNEL RELEASE;  Surgeon: Meredith Pel, MD;  Location: [facility];  Service: Orthopedics;  Laterality: Right;  . TOTAL KNEE ARTHROPLASTY Right 07/15/2012   Procedure: RIGHT TOTAL KNEE ARTHROPLASTY;  Surgeon: Lorn Junes, MD;  Location: [facility];  Service: Orthopedics;  Laterality: Right;   Social History   Occupational History  .  [employer]   Social History Main Topics  . Smoking status: Current Every Day Smoker    Packs/day: 0.50  . Smokeless tobacco: Never Used     Comment: Smoking 3-4 cigs/day  . Alcohol use 0.0 oz/week     Comment:  ocassionally  . Drug use: No  . Sexual activity: No

## 2016-05-16 MED FILL — TRETINOIN 0.01% GEL: 0.01 | 30 days supply | Qty: 45 | Fill #0

## 2016-05-29 ENCOUNTER — Telehealth (INDEPENDENT_AMBULATORY_CARE_PROVIDER_SITE_OTHER): Payer: Self-pay | Admitting: Orthopedic Surgery

## 2016-05-29 DIAGNOSIS — M6281 Muscle weakness (generalized): Secondary | ICD-10-CM | POA: Diagnosis not present

## 2016-05-29 DIAGNOSIS — M25511 Pain in right shoulder: Secondary | ICD-10-CM | POA: Diagnosis not present

## 2016-05-29 DIAGNOSIS — M75111 Incomplete rotator cuff tear or rupture of right shoulder, not specified as traumatic: Secondary | ICD-10-CM | POA: Diagnosis not present

## 2016-05-29 DIAGNOSIS — M79641 Pain in right hand: Secondary | ICD-10-CM | POA: Diagnosis not present

## 2016-05-29 NOTE — Telephone Encounter (Signed)
Op note faxed to number provided.

## 2016-05-29 NOTE — Telephone Encounter (Signed)
Alison Ford from [facility] called needing the op report sent over for the patient. Fax # [phone]

## 2016-05-30 ENCOUNTER — Ambulatory Visit: Payer: Medicare Other | Attending: Internal Medicine | Admitting: Internal Medicine

## 2016-05-30 ENCOUNTER — Encounter: Payer: Self-pay | Admitting: Internal Medicine

## 2016-05-30 VITALS — BP 149/87 | HR 68 | Temp 98.7°F | Resp 16 | Wt 186.0 lb

## 2016-05-30 DIAGNOSIS — E559 Vitamin D deficiency, unspecified: Secondary | ICD-10-CM

## 2016-05-30 DIAGNOSIS — E785 Hyperlipidemia, unspecified: Secondary | ICD-10-CM

## 2016-05-30 DIAGNOSIS — M549 Dorsalgia, unspecified: Secondary | ICD-10-CM | POA: Diagnosis not present

## 2016-05-30 DIAGNOSIS — M1711 Unilateral primary osteoarthritis, right knee: Secondary | ICD-10-CM | POA: Insufficient documentation

## 2016-05-30 DIAGNOSIS — Z1159 Encounter for screening for other viral diseases: Secondary | ICD-10-CM

## 2016-05-30 DIAGNOSIS — Z720 Tobacco use: Secondary | ICD-10-CM

## 2016-05-30 DIAGNOSIS — M75101 Unspecified rotator cuff tear or rupture of right shoulder, not specified as traumatic: Secondary | ICD-10-CM

## 2016-05-30 DIAGNOSIS — R011 Cardiac murmur, unspecified: Secondary | ICD-10-CM | POA: Diagnosis not present

## 2016-05-30 DIAGNOSIS — G4700 Insomnia, unspecified: Secondary | ICD-10-CM | POA: Diagnosis not present

## 2016-05-30 DIAGNOSIS — I10 Essential (primary) hypertension: Secondary | ICD-10-CM

## 2016-05-30 DIAGNOSIS — Z7982 Long term (current) use of aspirin: Secondary | ICD-10-CM | POA: Insufficient documentation

## 2016-05-30 DIAGNOSIS — Z880 Allergy status to penicillin: Secondary | ICD-10-CM | POA: Insufficient documentation

## 2016-05-30 DIAGNOSIS — R51 Headache: Secondary | ICD-10-CM | POA: Diagnosis not present

## 2016-05-30 DIAGNOSIS — J449 Chronic obstructive pulmonary disease, unspecified: Secondary | ICD-10-CM | POA: Diagnosis not present

## 2016-05-30 DIAGNOSIS — L680 Hirsutism: Secondary | ICD-10-CM

## 2016-05-30 MED ORDER — AMITRIPTYLINE HCL 25 MG PO TABS: 25.0000 mg | ORAL_TABLET | Freq: Every evening | ORAL | 1 refills | Status: DC | PRN

## 2016-05-30 MED ORDER — FLUTICASONE-SALMETEROL 100-50 MCG/DOSE IN AEPB: 1.0000 | INHALATION_SPRAY | Freq: Two times a day (BID) | RESPIRATORY_TRACT | 3 refills | Status: DC

## 2016-05-30 MED ORDER — LISINOPRIL-HYDROCHLOROTHIAZIDE 20-25 MG PO TABS: 1.0000 | ORAL_TABLET | Freq: Every day | ORAL | 3 refills | Status: DC

## 2016-05-30 MED ORDER — ASPIRIN EC 81 MG PO TBEC: 81.0000 mg | DELAYED_RELEASE_TABLET | Freq: Every day | ORAL | 3 refills | Status: DC

## 2016-05-30 MED ORDER — POLYETHYLENE GLYCOL 3350 17 GM/SCOOP PO POWD: 17.0000 g | Freq: Every day | ORAL | 3 refills | Status: DC | PRN

## 2016-05-30 MED ORDER — GUAIFENESIN ER 600 MG PO TB12: 600.0000 mg | ORAL_TABLET | Freq: Two times a day (BID) | ORAL | 2 refills | Status: DC

## 2016-05-30 MED ORDER — VENTOLIN HFA 108 (90 BASE) MCG/ACT IN AERS: 2.0000 | INHALATION_SPRAY | Freq: Four times a day (QID) | RESPIRATORY_TRACT | 2 refills | Status: DC | PRN

## 2016-05-30 MED ORDER — LOVASTATIN 40 MG PO TABS: 40.0000 mg | ORAL_TABLET | Freq: Every day | ORAL | 3 refills | Status: DC

## 2016-05-30 MED ORDER — AMLODIPINE BESYLATE 10 MG PO TABS: 10.0000 mg | ORAL_TABLET | Freq: Every day | ORAL | 3 refills | Status: DC

## 2016-05-30 MED ORDER — BENZONATATE 100 MG PO CAPS: 100.0000 mg | ORAL_CAPSULE | Freq: Three times a day (TID) | ORAL | 2 refills | Status: DC | PRN

## 2016-05-30 MED FILL — CROMOLYN 4% EYE DROPS: 4 | 30 days supply | Qty: 10 | Fill #1

## 2016-05-30 NOTE — Patient Instructions (Addendum)
Every 4th Tuesday in our parking lot, next one 06/06/16 at 230 pm - 4pm  - need to take Calcium 1200mg  /daily for bone health - over the counter - I am checking your vitamin D again, if still low, than we will renew the prescription.   - For your tobacco abuse: Strongly recommend that he stop smoking back and all other inhaled products right away Call 1-800-Quit-Now to get free nicotine replacement from the state of [place]  -   Steps to Quit Smoking Smoking tobacco can be bad for your health. It can also affect almost every organ in your body. Smoking puts you and people around you at risk for many serious long-lasting (chronic) diseases. Quitting smoking is hard, but it is one of the best things that you can do for your health. It is never too late to quit. What are the benefits of quitting smoking? When you quit smoking, you lower your risk for getting serious diseases and conditions. They can include:  Lung cancer or lung disease.  Heart disease.  Stroke.  Heart attack.  Not being able to have children (infertility).  Weak bones (osteoporosis) and broken bones (fractures). If you have coughing, wheezing, and shortness of breath, those symptoms may get better when you quit. You may also get sick less often. If you are pregnant, quitting smoking can help to lower your chances of having a baby of low birth weight. What can I do to help me quit smoking? Talk with your doctor about what can help you quit smoking. Some things you can do (strategies) include:  Quitting smoking totally, instead of slowly cutting back how much you smoke over a period of time.  Going to in-person counseling. You are more likely to quit if you go to many counseling sessions.  Using resources and support systems, such as:  Online chats with a Social worker.  Phone quitlines.  Printed Furniture conservator/restorer.  Support groups or group counseling.  Text messaging programs.  Mobile phone apps or  applications.  Taking medicines. Some of these medicines may have nicotine in them. If you are pregnant or breastfeeding, do not take any medicines to quit smoking unless your doctor says it is okay. Talk with your doctor about counseling or other things that can help you. Talk with your doctor about using more than one strategy at the same time, such as taking medicines while you are also going to in-person counseling. This can help make quitting easier. What things can I do to make it easier to quit? Quitting smoking might feel very hard at first, but there is a lot that you can do to make it easier. Take these steps:  Talk to your family and friends. Ask them to support and encourage you.  Call phone quitlines, reach out to support groups, or work with a Social worker.  Ask people who smoke to not smoke around you.  Avoid places that make you want (trigger) to smoke, such as:  Bars.  Parties.  Smoke-break areas at work.  Spend time with people who do not smoke.  Lower the stress in your life. Stress can make you want to smoke. Try these things to help your stress:  Getting regular exercise.  Deep-breathing exercises.  Yoga.  Meditating.  Doing a body scan. To do this, close your eyes, focus on one area of your body at a time from head to toe, and notice which parts of your body are tense. Try to relax the muscles in those  areas.  Download or buy apps on your mobile phone or tablet that can help you stick to your quit plan. There are many free apps, such as QuitGuide from the [employer] Office manager for Disease Control and Prevention). You can find more support from smokefree.gov and other websites. This information is not intended to replace advice given to you by your health care provider. Make sure you discuss any questions you have with your health care provider. Document Released: 10/29/2008 Document Revised: 08/31/2015 Document Reviewed: 05/19/2014 Elsevier Interactive Patient  Education  2017 [place].  Low-Sodium Eating Plan Sodium, which is an element that makes up salt, helps you maintain a healthy balance of fluids in your body. Too much sodium can increase your blood pressure and cause fluid and waste to be held in your body. Your health care provider or dietitian may recommend following this plan if you have high blood pressure (hypertension), kidney disease, liver disease, or heart failure. Eating less sodium can help lower your blood pressure, reduce swelling, and protect your heart, liver, and kidneys. What are tips for following this plan? General guidelines   Most people on this plan should limit their sodium intake to 1,500-2,000 mg (milligrams) of sodium each day. Reading food labels   The Nutrition Facts label lists the amount of sodium in one serving of the food. If you eat more than one serving, you must multiply the listed amount of sodium by the number of servings.  Choose foods with less than 140 mg of sodium per serving.  Avoid foods with 300 mg of sodium or more per serving. Shopping   Look for lower-sodium products, often labeled as "low-sodium" or "no salt added."  Always check the sodium content even if foods are labeled as "unsalted" or "no salt added".  Buy fresh foods.  Avoid canned foods and premade or frozen meals.  Avoid canned, cured, or processed meats  Buy breads that have less than 80 mg of sodium per slice. Cooking   Eat more home-cooked food and less restaurant, buffet, and fast food.  Avoid adding salt when cooking. Use salt-free seasonings or herbs instead of table salt or sea salt. Check with your health care provider or pharmacist before using salt substitutes.  Cook with plant-based oils, such as canola, sunflower, or olive oil. Meal planning   When eating at a restaurant, ask that your food be prepared with less salt or no salt, if possible.  Avoid foods that contain MSG (monosodium glutamate). MSG is  sometimes added to [place] food, bouillon, and some canned foods. What foods are recommended? The items listed may not be a complete list. Talk with your dietitian about what dietary choices are best for you. Grains  Low-sodium cereals, including oats, puffed wheat and rice, and shredded wheat. Low-sodium crackers. Unsalted rice. Unsalted pasta. Low-sodium bread. Whole-grain breads and whole-grain pasta. Vegetables  Fresh or frozen vegetables. "No salt added" canned vegetables. "No salt added" tomato sauce and paste. Low-sodium or reduced-sodium tomato and vegetable juice. Fruits  Fresh, frozen, or canned fruit. Fruit juice. Meats and other protein foods  Fresh or frozen (no salt added) meat, poultry, seafood, and fish. Low-sodium canned tuna and salmon. Unsalted nuts. Dried peas, beans, and lentils without added salt. Unsalted canned beans. Eggs. Unsalted nut butters. Dairy  Milk. Soy milk. Cheese that is naturally low in sodium, such as ricotta cheese, fresh mozzarella, or Swiss cheese Low-sodium or reduced-sodium cheese. Cream cheese. Yogurt. Fats and oils  Unsalted butter. Unsalted margarine with  no trans fat. Vegetable oils such as canola or olive oils. Seasonings and other foods  Fresh and dried herbs and spices. Salt-free seasonings. Low-sodium mustard and ketchup. Sodium-free salad dressing. Sodium-free light mayonnaise. Fresh or refrigerated horseradish. Lemon juice. Vinegar. Homemade, reduced-sodium, or low-sodium soups. Unsalted popcorn and pretzels. Low-salt or salt-free chips. What foods are not recommended? The items listed may not be a complete list. Talk with your dietitian about what dietary choices are best for you. Grains  Instant hot cereals. Bread stuffing, pancake, and biscuit mixes. Croutons. Seasoned rice or pasta mixes. Noodle soup cups. Boxed or frozen macaroni and cheese. Regular salted crackers. Self-rising flour. Vegetables  Sauerkraut, pickled vegetables, and  relishes. Olives. [place] fries. Onion rings. Regular canned vegetables (not low-sodium or reduced-sodium). Regular canned tomato sauce and paste (not low-sodium or reduced-sodium). Regular tomato and vegetable juice (not low-sodium or reduced-sodium). Frozen vegetables in sauces. Meats and other protein foods  Meat or fish that is salted, canned, smoked, spiced, or pickled. Bacon, ham, sausage, hotdogs, corned beef, chipped beef, packaged lunch meats, salt pork, jerky, pickled herring, anchovies, regular canned tuna, sardines, salted nuts. Dairy  Processed cheese and cheese spreads. Cheese curds. Blue cheese. Feta cheese. String cheese. Regular cottage cheese. Buttermilk. Canned milk. Fats and oils  Salted butter. Regular margarine. Ghee. Bacon fat. Seasonings and other foods  Onion salt, garlic salt, seasoned salt, table salt, and sea salt. Canned and packaged gravies. Worcestershire sauce. Tartar sauce. Barbecue sauce. Teriyaki sauce. Soy sauce, including reduced-sodium. Steak sauce. Fish sauce. Oyster sauce. Cocktail sauce. Horseradish that you find on the shelf. Regular ketchup and mustard. Meat flavorings and tenderizers. Bouillon cubes. Hot sauce and Tabasco sauce. Premade or packaged marinades. Premade or packaged taco seasonings. Relishes. Regular salad dressings. Salsa. Potato and tortilla chips. Corn chips and puffs. Salted popcorn and pretzels. Canned or dried soups. Pizza. Frozen entrees and pot pies. Summary  Eating less sodium can help lower your blood pressure, reduce swelling, and protect your heart, liver, and kidneys.  Most people on this plan should limit their sodium intake to 1,500-2,000 mg (milligrams) of sodium each day.  Canned, boxed, and frozen foods are high in sodium. Restaurant foods, fast foods, and pizza are also very high in sodium. You also get sodium by adding salt to food.  Try to cook at home, eat more fresh fruits and vegetables, and eat less fast food, canned,  processed, or prepared foods. This information is not intended to replace advice given to you by your health care provider. Make sure you discuss any questions you have with your health care provider. Document Released: 06/24/2001 Document Revised: 12/27/2015 Document Reviewed: 12/27/2015 Elsevier Interactive Patient Education  2017 [employer].

## 2016-05-30 NOTE — Progress Notes (Signed)
Alison Ford, is a 61 y.o. female  WLN:[id number]  DEY:[id number]  DOB - [birth year]  Chief Complaint  Patient presents with  . Hypertension        Subjective:   Alison Ford is a 61 y.o. female here today for a follow up visit for htn, hld, and tobacco. She had right rotator cuff repair and right carpel tunnel release surgery on 05/02/16. Slowing doing better and getting PT now for it.  Of note, she is back to smoking 1pack q3days. Wants to stop, but it is hard.  Taking all her meds. Asked for refill for cough syrup (+ codeine) b/c it helps her sleep.  Co of insomnia most nights for years.     Patient has No headache, No chest pain, No abdominal pain - No Nausea, No new weakness tingling or numbness, No Cough - SOB.  No problems updated.  ALLERGIES: Allergies  Allergen Reactions  . Penicillins Itching and Other (See Comments)    Burning and whelps Has patient had a PCN reaction causing immediate rash, facial/tongue/throat swelling, SOB or lightheadedness with hypotension: NO Has patient had a PCN reaction causing severe rash involving mucus membranes or skin necrosis: NO Has patient had a PCN reaction that required hospitalization: NO Has patient had a PCN reaction occurring within the last 10 years: NO If all of the above answers are "NO", then may proceed with Cephalosporin use.    PAST MEDICAL HISTORY: Past Medical History:  Diagnosis Date  . Abnormal facial hair   . Back pain    d/t knee pain  . Headache(784.0)   . Heart murmur    slight  . HTN (hypertension)    takes Amlodipine daily and HCTZ   . Hyperlipidemia    takes Lovastatin nightly  . Joint pain   . Right knee DJD   . Weakness    left hand    MEDICATIONS AT HOME: Prior to Admission medications   Medication Sig Start Date End Date Taking? Authorizing Provider  amLODipine (NORVASC) 10 MG tablet Take 1 tablet (10 mg total) by mouth daily. 05/30/16   Maren Reamer, MD  aspirin EC 81 MG  tablet Take 1 tablet (81 mg total) by mouth daily. 05/30/16   Elliyah Liszewski, Leda Quail, MD  benzonatate (TESSALON) 100 MG capsule Take 1 capsule (100 mg total) by mouth 3 (three) times daily as needed for cough. 05/30/16   Maren Reamer, MD  clotrimazole-betamethasone (LOTRISONE) cream Apply to affected area as directed Patient taking differently: Apply 1 application topically daily as needed (fungus).  02/16/16   Harlym Gehling T, MD  Fluticasone-Salmeterol (ADVAIR) 100-50 MCG/DOSE AEPB Inhale 1 puff into the lungs 2 (two) times daily. 05/30/16   Maren Reamer, MD  guaiFENesin (MUCINEX) 600 MG 12 hr tablet Take 1 tablet (600 mg total) by mouth 2 (two) times daily. 05/30/16   Maren Reamer, MD  lisinopril-hydrochlorothiazide (PRINZIDE,ZESTORETIC) 20-25 MG tablet Take 1 tablet by mouth daily. 05/30/16   Maren Reamer, MD  lovastatin (MEVACOR) 40 MG tablet Take 1 tablet (40 mg total) by mouth at bedtime. 05/30/16   Diyan Dave, Leda Quail, MD  oxyCODONE (OXY IR/ROXICODONE) 5 MG immediate release tablet Take 1 tablet (5 mg total) by mouth every 12 (twelve) hours as needed for severe pain. 05/15/16   Meredith Pel, MD  polyethylene glycol powder (GLYCOLAX/MIRALAX) powder Take 17 g by mouth daily as needed for moderate constipation. 05/30/16   Maren Reamer, MD  tiZANidine (ZANAFLEX) 4  MG tablet Take 1 tablet (4 mg total) by mouth every 6 (six) hours as needed for muscle spasms. 05/05/16   Newt Minion, MD  tretinoin (RETIN-A) 0.01 % gel Apply topically at bedtime. Patient taking differently: Apply 1 application topically daily as needed (breakouts).  11/18/14   Nicolette Bang, DO  triamcinolone cream (KENALOG) 0.1 % APPLY 1 APPLICATION TOPICALLY 2 TIMES DAILY. Patient taking differently: Apply 1 application topically daily as needed (itching).  08/12/14   Lance Bosch, NP  VENTOLIN HFA 108 (90 Base) MCG/ACT inhaler Inhale 2 puffs into the lungs every 6 (six) hours as needed for wheezing  or shortness of breath. 05/30/16   Maren Reamer, MD     Objective:   Vitals:   05/30/16 1059  BP: (!) 149/87  Pulse: 68  Resp: 16  Temp: 98.7 F (37.1 C)  TempSrc: Oral  SpO2: 95%  Weight: 186 lb (84.4 kg)    Exam General appearance : Awake, alert, not in any distress. Speech Clear. Not toxic looking, pleasant. HEENT: Atraumatic and Normocephalic, pupils equally reactive to light. Suspected hirsutism, has mustache. Neck: supple, no JVD.  Chest:Good air entry bilaterally, no added sounds. CVS: S1 S2 regular, no murmurs/gallups or rubs. Abdomen: Bowel sounds active, Non tender and not distended with no gaurding, rigidity or rebound. Extremities: B/L Lower Ext shows no edema, both legs are warm to touch, limited rom on right shoulder, but pain improving.  Right wrist incision from recent carpel tunnel release c/d/i. Neurology: Awake alert, and oriented X 3, CN II-XII grossly intact, Non focal Skin:No Rash  Data Review Lab Results  Component Value Date   HGBA1C 5.5 09/29/2015   HGBA1C 6.2 (H) 03/17/2014    Depression screen PHQ 2/9 05/30/2016 02/14/2016 11/01/2015 09/29/2015 11/18/2014  Decreased Interest 2 0 0 2 0  Down, Depressed, Hopeless 0 0 0 0 0  PHQ - 2 Score 2 0 0 2 0  Altered sleeping 2 - - 2 -  Tired, decreased energy 0 - - 2 -  Change in appetite 0 - - 0 -  Feeling bad or failure about yourself  0 - - 0 -  Trouble concentrating 0 - - 0 -  Moving slowly or fidgety/restless 0 - - 0 -  Suicidal thoughts 0 - - 0 -  PHQ-9 Score 4 - - 6 -  Difficult doing work/chores - - - Not difficult at all -      Assessment & Plan   1. Essential hypertension elevated today, low salt diet discussed and recd - amLODipine (NORVASC) 10 MG tablet; Take 1 tablet (10 mg total) by mouth daily.  Dispense: 90 tablet; Refill: 3 - dc lisinopril - add prinzide 20-25 qd  2. Tear of right rotator cuff, unspecified tear extent Per Ortho and PT  3. Hyperlipidemia, unspecified  hyperlipidemia type ascvd risk score >20% in 10 years, asa bleeding risk only 1.3% - start asa 81 qd regimen - Lipid Panel - continue mevacor 40 qhs  4. Encounter for hepatitis C screening test for low risk patient - Hepatitis C antibody  5. Vitamin D deficiency - encouraged Calcium 1200mg /day for bone health - VITAMIN D 25 Hydroxy (Vit-D Deficiency, Fractures)  6. codp - doing well on advair, cotinued. - prn abuterol - total tob cessation recd - added mucinex 600bid for cough, and tessalon pearls prn for cough  7. tob abuse For your tobacco abuse: Strongly recommend that he stop smoking back and all other inhaled products  right away Call 1-800-Quit-Now to get free nicotine replacement from the state of [place]  8. Insomnia - dw pt that long term use of codeine (in mucinex cough syrup she had, last filled 02/14/16) is not a good sleep agent. - trial elavil 25mg  qhs prn for sleep  Patient have been counseled extensively about nutrition and exercise  Return in about 3 months (around 08/30/2016), or if symptoms worsen or fail to improve.  The patient was given clear instructions to go to ER or return to medical center if symptoms don't improve, worsen or new problems develop. The patient verbalized understanding. The patient was told to call to get lab results if they haven't heard anything in the next week.   This note has been created with Surveyor, quantity. Any transcriptional errors are unintentional.   Maren Reamer, MD, [facility] [place], [place]   05/30/2016, 11:15 AM

## 2016-05-31 LAB — LIPID PANEL
Chol/HDL Ratio: 4.9 ratio — ABNORMAL HIGH (ref 0.0–4.4)
Cholesterol, Total: 224 mg/dL — ABNORMAL HIGH (ref 100–199)
HDL: 46 mg/dL (ref >39)
LDL Calculated: 152 mg/dL — ABNORMAL HIGH (ref 0–99)
TRIGLYCERIDES: 130 mg/dL (ref 0–149)
VLDL Cholesterol Cal: 26 mg/dL (ref 5–40)

## 2016-05-31 LAB — VITAMIN D 25 HYDROXY (VIT D DEFICIENCY, FRACTURES): Vit D, 25-Hydroxy: 13.8 ng/mL — ABNORMAL LOW (ref 30.0–100.0)

## 2016-05-31 LAB — HEPATITIS C ANTIBODY: Hep C Virus Ab: <0.1 s/co ratio (ref 0.0–0.9)

## 2016-06-01 ENCOUNTER — Encounter: Payer: Self-pay | Admitting: Internal Medicine

## 2016-06-01 DIAGNOSIS — M6281 Muscle weakness (generalized): Secondary | ICD-10-CM | POA: Diagnosis not present

## 2016-06-01 DIAGNOSIS — M75111 Incomplete rotator cuff tear or rupture of right shoulder, not specified as traumatic: Secondary | ICD-10-CM | POA: Diagnosis not present

## 2016-06-01 DIAGNOSIS — M79641 Pain in right hand: Secondary | ICD-10-CM | POA: Diagnosis not present

## 2016-06-01 DIAGNOSIS — M25511 Pain in right shoulder: Secondary | ICD-10-CM | POA: Diagnosis not present

## 2016-06-02 ENCOUNTER — Encounter: Payer: Self-pay | Admitting: Internal Medicine

## 2016-06-05 ENCOUNTER — Other Ambulatory Visit: Payer: Self-pay | Admitting: Internal Medicine

## 2016-06-05 ENCOUNTER — Encounter: Payer: Self-pay | Admitting: Internal Medicine

## 2016-06-05 MED ORDER — VITAMIN D (ERGOCALCIFEROL) 1.25 MG (50000 UNIT) PO CAPS: 50000.0000 [IU] | ORAL_CAPSULE | ORAL | 0 refills | Status: DC

## 2016-06-06 ENCOUNTER — Other Ambulatory Visit: Payer: Self-pay

## 2016-06-06 ENCOUNTER — Telehealth: Payer: Self-pay

## 2016-06-06 DIAGNOSIS — M25511 Pain in right shoulder: Secondary | ICD-10-CM | POA: Diagnosis not present

## 2016-06-06 DIAGNOSIS — M75111 Incomplete rotator cuff tear or rupture of right shoulder, not specified as traumatic: Secondary | ICD-10-CM | POA: Diagnosis not present

## 2016-06-06 DIAGNOSIS — M6281 Muscle weakness (generalized): Secondary | ICD-10-CM | POA: Diagnosis not present

## 2016-06-06 DIAGNOSIS — M79641 Pain in right hand: Secondary | ICD-10-CM | POA: Diagnosis not present

## 2016-06-06 MED ORDER — VITAMIN D (ERGOCALCIFEROL) 1.25 MG (50000 UNIT) PO CAPS: 50000.0000 [IU] | ORAL_CAPSULE | ORAL | 0 refills | Status: DC

## 2016-06-06 MED FILL — VIT D2 1.25 MG (50,000 UNIT: 1.25 MG | 28 days supply | Qty: 4 | Fill #0

## 2016-06-06 NOTE — Telephone Encounter (Signed)
Contacted pt to over lab results pt is aware of results and doesn't have any questions or concerns

## 2016-06-13 DIAGNOSIS — M25511 Pain in right shoulder: Secondary | ICD-10-CM | POA: Diagnosis not present

## 2016-06-13 DIAGNOSIS — M79641 Pain in right hand: Secondary | ICD-10-CM | POA: Diagnosis not present

## 2016-06-13 DIAGNOSIS — M75111 Incomplete rotator cuff tear or rupture of right shoulder, not specified as traumatic: Secondary | ICD-10-CM | POA: Diagnosis not present

## 2016-06-13 DIAGNOSIS — M6281 Muscle weakness (generalized): Secondary | ICD-10-CM | POA: Diagnosis not present

## 2016-06-14 ENCOUNTER — Encounter (INDEPENDENT_AMBULATORY_CARE_PROVIDER_SITE_OTHER): Payer: Self-pay | Admitting: Orthopedic Surgery

## 2016-06-14 ENCOUNTER — Ambulatory Visit (INDEPENDENT_AMBULATORY_CARE_PROVIDER_SITE_OTHER): Payer: Medicare Other | Admitting: Orthopedic Surgery

## 2016-06-14 DIAGNOSIS — M75101 Unspecified rotator cuff tear or rupture of right shoulder, not specified as traumatic: Secondary | ICD-10-CM

## 2016-06-14 MED ORDER — HYDROCODONE-ACETAMINOPHEN 5-325 MG PO TABS: 1.0000 | ORAL_TABLET | Freq: Two times a day (BID) | ORAL | 0 refills | Status: DC | PRN

## 2016-06-15 DIAGNOSIS — M6281 Muscle weakness (generalized): Secondary | ICD-10-CM | POA: Diagnosis not present

## 2016-06-15 DIAGNOSIS — M79641 Pain in right hand: Secondary | ICD-10-CM | POA: Diagnosis not present

## 2016-06-15 DIAGNOSIS — M75111 Incomplete rotator cuff tear or rupture of right shoulder, not specified as traumatic: Secondary | ICD-10-CM | POA: Diagnosis not present

## 2016-06-15 DIAGNOSIS — M25511 Pain in right shoulder: Secondary | ICD-10-CM | POA: Diagnosis not present

## 2016-06-15 NOTE — Progress Notes (Signed)
Post-Op Visit Note   Patient: Alison Ford           Date of Birth: Apr 07, 1955           MRN: [id number] Visit Date: 06/14/2016 PCP: Alison Reamer, MD   Assessment & Plan:  Chief Complaint:  Chief Complaint  Patient presents with  . Right Shoulder - Pain, Follow-up  . Right Wrist - Pain, Follow-up   Visit Diagnoses: No diagnosis found.  Plan: Alison Ford is now 6 weeks out right shoulder rotator cuff repair and carpal tunnel release.  She is ambulating with a cane in the right hand.  She's doing well with her hand.  She is doing physical therapy for the shoulder.  States the pain is worse in the morning.  She likes refill of oxycodone.  Hold off on any more oxycodone refills and we'll try for Norco and Tylenol instead.  She is in physical therapy 2 times a week.  On exam she has good grip strength on the right with well-healed carpal tunnel release incision.  On the shoulder she has good passive range of motion with no course grinding or crepitus.  Rotator cuff strength is improving and passive motion is reasonable.  Plan is to continue therapy.  Transition to less strong pain medicine.  6 week return for final check.  Follow-Up Instructions: Return in about 6 weeks (around 07/26/2016).   Orders:  No orders of the defined types were placed in this encounter.  Meds ordered this encounter  Medications  . HYDROcodone-acetaminophen (NORCO/VICODIN) 5-325 MG tablet    Sig: Take 1 tablet by mouth every 12 (twelve) hours as needed for moderate pain.    Dispense:  40 tablet    Refill:  0    Imaging: No results found.  PMFS History: Patient Active Problem List   Diagnosis Date Noted  . Chronic obstructive pulmonary disease ([place]) 05/30/2016  . Rotator cuff tear 05/02/2016  . Rotator cuff tendonitis, right 12/30/2015  . Cervicalgia 12/30/2015  . Eczema, dyshidrotic 11/18/2014  . Sebaceous cyst 11/18/2014  . Acne 11/18/2014  . Hyperpigmented skin lesion 11/18/2014  .  Smoker 06/01/2014  . Orthostatic hypotension 06/01/2014  . Cellulitis and abscess of trunk 03/17/2014  . Right knee DJD   . Hyperlipidemia   . Abnormal EKG 06/05/2012  . HTN (hypertension) 06/05/2012   Past Medical History:  Diagnosis Date  . Abnormal facial hair   . Back pain    d/t knee pain  . Headache(784.0)   . Heart murmur    slight  . HTN (hypertension)    takes Amlodipine daily and HCTZ   . Hyperlipidemia    takes Lovastatin nightly  . Joint pain   . Right knee DJD   . Weakness    left hand    Family History  Problem Relation Age of Onset  . CAD Father 58  . CAD Mother 68  . Hypertension Mother     Past Surgical History:  Procedure Laterality Date  . ABDOMINAL HYSTERECTOMY    . Athroscopic knee surgery     Bilateral  . CARPAL TUNNEL RELEASE Right 05/02/2016   Procedure: CARPAL TUNNEL RELEASE;  Surgeon: Meredith Pel, MD;  Location: [facility];  Service: Orthopedics;  Laterality: Right;  . CHOLECYSTECTOMY    . DILATION AND CURETTAGE OF UTERUS    . EYE SURGERY Right   . SHOULDER ARTHROSCOPY WITH SUBACROMIAL DECOMPRESSION Right 05/02/2016   Procedure: RIGHT SHOULDER ARTHROSCOPY WITH SUBACROMIAL DECOMPRESSION, MINI OPEN  ROTATOR CUFF TEAR REPAIR, AND RIGHT CARPAL TUNNEL RELEASE;  Surgeon: Meredith Pel, MD;  Location: [facility];  Service: Orthopedics;  Laterality: Right;  . TOTAL KNEE ARTHROPLASTY Right 07/15/2012   Procedure: RIGHT TOTAL KNEE ARTHROPLASTY;  Surgeon: Lorn Junes, MD;  Location: [facility];  Service: Orthopedics;  Laterality: Right;   Social History   Occupational History  .  [employer]   Social History Main Topics  . Smoking status: Current Every Day Smoker    Packs/day: 0.50  . Smokeless tobacco: Never Used     Comment: Smoking 3-4 cigs/day  . Alcohol use 0.0 oz/week     Comment: ocassionally  . Drug use: No  . Sexual activity: No

## 2016-07-04 DIAGNOSIS — M6281 Muscle weakness (generalized): Secondary | ICD-10-CM | POA: Diagnosis not present

## 2016-07-04 DIAGNOSIS — M79641 Pain in right hand: Secondary | ICD-10-CM | POA: Diagnosis not present

## 2016-07-04 DIAGNOSIS — M75111 Incomplete rotator cuff tear or rupture of right shoulder, not specified as traumatic: Secondary | ICD-10-CM | POA: Diagnosis not present

## 2016-07-04 DIAGNOSIS — M25511 Pain in right shoulder: Secondary | ICD-10-CM | POA: Diagnosis not present

## 2016-07-11 DIAGNOSIS — M75111 Incomplete rotator cuff tear or rupture of right shoulder, not specified as traumatic: Secondary | ICD-10-CM | POA: Diagnosis not present

## 2016-07-11 DIAGNOSIS — M25511 Pain in right shoulder: Secondary | ICD-10-CM | POA: Diagnosis not present

## 2016-07-11 DIAGNOSIS — M79641 Pain in right hand: Secondary | ICD-10-CM | POA: Diagnosis not present

## 2016-07-11 DIAGNOSIS — M6281 Muscle weakness (generalized): Secondary | ICD-10-CM | POA: Diagnosis not present

## 2016-07-13 DIAGNOSIS — M75111 Incomplete rotator cuff tear or rupture of right shoulder, not specified as traumatic: Secondary | ICD-10-CM | POA: Diagnosis not present

## 2016-07-13 DIAGNOSIS — M25511 Pain in right shoulder: Secondary | ICD-10-CM | POA: Diagnosis not present

## 2016-07-13 DIAGNOSIS — M6281 Muscle weakness (generalized): Secondary | ICD-10-CM | POA: Diagnosis not present

## 2016-07-13 DIAGNOSIS — M79641 Pain in right hand: Secondary | ICD-10-CM | POA: Diagnosis not present

## 2016-07-20 DIAGNOSIS — M25511 Pain in right shoulder: Secondary | ICD-10-CM | POA: Diagnosis not present

## 2016-07-20 DIAGNOSIS — M75111 Incomplete rotator cuff tear or rupture of right shoulder, not specified as traumatic: Secondary | ICD-10-CM | POA: Diagnosis not present

## 2016-07-20 DIAGNOSIS — M79641 Pain in right hand: Secondary | ICD-10-CM | POA: Diagnosis not present

## 2016-07-20 DIAGNOSIS — M6281 Muscle weakness (generalized): Secondary | ICD-10-CM | POA: Diagnosis not present

## 2016-07-25 DIAGNOSIS — M75111 Incomplete rotator cuff tear or rupture of right shoulder, not specified as traumatic: Secondary | ICD-10-CM | POA: Diagnosis not present

## 2016-07-25 DIAGNOSIS — M79641 Pain in right hand: Secondary | ICD-10-CM | POA: Diagnosis not present

## 2016-07-25 DIAGNOSIS — M25511 Pain in right shoulder: Secondary | ICD-10-CM | POA: Diagnosis not present

## 2016-07-25 DIAGNOSIS — M6281 Muscle weakness (generalized): Secondary | ICD-10-CM | POA: Diagnosis not present

## 2016-07-26 ENCOUNTER — Ambulatory Visit (INDEPENDENT_AMBULATORY_CARE_PROVIDER_SITE_OTHER): Payer: Medicare Other | Admitting: Orthopedic Surgery

## 2016-07-26 ENCOUNTER — Encounter (INDEPENDENT_AMBULATORY_CARE_PROVIDER_SITE_OTHER): Payer: Self-pay | Admitting: Orthopedic Surgery

## 2016-07-26 DIAGNOSIS — M75101 Unspecified rotator cuff tear or rupture of right shoulder, not specified as traumatic: Secondary | ICD-10-CM

## 2016-07-26 NOTE — Progress Notes (Signed)
Post-Op Visit Note   Patient: Alison Ford           Date of Birth: May 26, 1955           MRN: [id number] Visit Date: 07/26/2016 PCP: Maren Reamer, MD   Assessment & Plan:  Chief Complaint:  Chief Complaint  Patient presents with  . Right Shoulder - Follow-up   Visit Diagnoses:  1. Tear of right rotator cuff, unspecified tear extent     Plan: Nareh is a 61 year old patient who is now about 2 and half months out right rotator cuff repair and right carpal tunnel release.  She's doing home exercise program.  Taking oxycodone at night only.  Still is in therapy ambulating.  She still has room for improvement.  On exam she does have improving active forward flexion and abduction.  Strength is also improving.  Right hand is asymptomatic.  Plan at this time is to continue therapy 2 times a week for 6 weeks progressing to a home exercise program with return office visit in 3 months for final check.  I think in general she is making progress.  Follow-Up Instructions: No Follow-up on file.   Orders:  No orders of the defined types were placed in this encounter.  No orders of the defined types were placed in this encounter.   Imaging: No results found.  PMFS History: Patient Active Problem List   Diagnosis Date Noted  . Chronic obstructive pulmonary disease ([place]) 05/30/2016  . Rotator cuff tear 05/02/2016  . Rotator cuff tendonitis, right 12/30/2015  . Cervicalgia 12/30/2015  . Eczema, dyshidrotic 11/18/2014  . Sebaceous cyst 11/18/2014  . Acne 11/18/2014  . Hyperpigmented skin lesion 11/18/2014  . Smoker 06/01/2014  . Orthostatic hypotension 06/01/2014  . Cellulitis and abscess of trunk 03/17/2014  . Right knee DJD   . Hyperlipidemia   . Abnormal EKG 06/05/2012  . HTN (hypertension) 06/05/2012   Past Medical History:  Diagnosis Date  . Abnormal facial hair   . Back pain    d/t knee pain  . Headache(784.0)   . Heart murmur    slight  . HTN (hypertension)      takes Amlodipine daily and HCTZ   . Hyperlipidemia    takes Lovastatin nightly  . Joint pain   . Right knee DJD   . Weakness    left hand    Family History  Problem Relation Age of Onset  . CAD Father 34  . CAD Mother 61  . Hypertension Mother     Past Surgical History:  Procedure Laterality Date  . ABDOMINAL HYSTERECTOMY    . Athroscopic knee surgery     Bilateral  . CARPAL TUNNEL RELEASE Right 05/02/2016   Procedure: CARPAL TUNNEL RELEASE;  Surgeon: Meredith Pel, MD;  Location: [facility];  Service: Orthopedics;  Laterality: Right;  . CHOLECYSTECTOMY    . DILATION AND CURETTAGE OF UTERUS    . EYE SURGERY Right   . SHOULDER ARTHROSCOPY WITH SUBACROMIAL DECOMPRESSION Right 05/02/2016   Procedure: RIGHT SHOULDER ARTHROSCOPY WITH SUBACROMIAL DECOMPRESSION, MINI OPEN ROTATOR CUFF TEAR REPAIR, AND RIGHT CARPAL TUNNEL RELEASE;  Surgeon: Meredith Pel, MD;  Location: [facility];  Service: Orthopedics;  Laterality: Right;  . TOTAL KNEE ARTHROPLASTY Right 07/15/2012   Procedure: RIGHT TOTAL KNEE ARTHROPLASTY;  Surgeon: Lorn Junes, MD;  Location: [facility];  Service: Orthopedics;  Laterality: Right;   Social History   Occupational History  .  [employer]   Social History  Main Topics  . Smoking status: Current Every Day Smoker    Packs/day: 0.50  . Smokeless tobacco: Never Used     Comment: Smoking 3-4 cigs/day  . Alcohol use 0.0 oz/week     Comment: ocassionally  . Drug use: No  . Sexual activity: No

## 2016-07-27 DIAGNOSIS — M25511 Pain in right shoulder: Secondary | ICD-10-CM | POA: Diagnosis not present

## 2016-07-27 DIAGNOSIS — M75111 Incomplete rotator cuff tear or rupture of right shoulder, not specified as traumatic: Secondary | ICD-10-CM | POA: Diagnosis not present

## 2016-07-27 DIAGNOSIS — M79641 Pain in right hand: Secondary | ICD-10-CM | POA: Diagnosis not present

## 2016-07-27 DIAGNOSIS — M6281 Muscle weakness (generalized): Secondary | ICD-10-CM | POA: Diagnosis not present

## 2016-08-01 DIAGNOSIS — M75111 Incomplete rotator cuff tear or rupture of right shoulder, not specified as traumatic: Secondary | ICD-10-CM | POA: Diagnosis not present

## 2016-08-01 DIAGNOSIS — M79641 Pain in right hand: Secondary | ICD-10-CM | POA: Diagnosis not present

## 2016-08-01 DIAGNOSIS — M6281 Muscle weakness (generalized): Secondary | ICD-10-CM | POA: Diagnosis not present

## 2016-08-01 DIAGNOSIS — M25511 Pain in right shoulder: Secondary | ICD-10-CM | POA: Diagnosis not present

## 2016-08-03 DIAGNOSIS — M79641 Pain in right hand: Secondary | ICD-10-CM | POA: Diagnosis not present

## 2016-08-03 DIAGNOSIS — M25511 Pain in right shoulder: Secondary | ICD-10-CM | POA: Diagnosis not present

## 2016-08-03 DIAGNOSIS — M75111 Incomplete rotator cuff tear or rupture of right shoulder, not specified as traumatic: Secondary | ICD-10-CM | POA: Diagnosis not present

## 2016-08-03 DIAGNOSIS — M6281 Muscle weakness (generalized): Secondary | ICD-10-CM | POA: Diagnosis not present

## 2016-08-08 DIAGNOSIS — M75111 Incomplete rotator cuff tear or rupture of right shoulder, not specified as traumatic: Secondary | ICD-10-CM | POA: Diagnosis not present

## 2016-08-08 DIAGNOSIS — M25511 Pain in right shoulder: Secondary | ICD-10-CM | POA: Diagnosis not present

## 2016-08-08 DIAGNOSIS — M79641 Pain in right hand: Secondary | ICD-10-CM | POA: Diagnosis not present

## 2016-08-08 DIAGNOSIS — M6281 Muscle weakness (generalized): Secondary | ICD-10-CM | POA: Diagnosis not present

## 2016-08-10 DIAGNOSIS — M25511 Pain in right shoulder: Secondary | ICD-10-CM | POA: Diagnosis not present

## 2016-08-10 DIAGNOSIS — M6281 Muscle weakness (generalized): Secondary | ICD-10-CM | POA: Diagnosis not present

## 2016-08-10 DIAGNOSIS — M75111 Incomplete rotator cuff tear or rupture of right shoulder, not specified as traumatic: Secondary | ICD-10-CM | POA: Diagnosis not present

## 2016-08-10 DIAGNOSIS — M79641 Pain in right hand: Secondary | ICD-10-CM | POA: Diagnosis not present

## 2016-08-15 DIAGNOSIS — M25511 Pain in right shoulder: Secondary | ICD-10-CM | POA: Diagnosis not present

## 2016-08-15 DIAGNOSIS — M79641 Pain in right hand: Secondary | ICD-10-CM | POA: Diagnosis not present

## 2016-08-15 DIAGNOSIS — M75111 Incomplete rotator cuff tear or rupture of right shoulder, not specified as traumatic: Secondary | ICD-10-CM | POA: Diagnosis not present

## 2016-08-15 DIAGNOSIS — M6281 Muscle weakness (generalized): Secondary | ICD-10-CM | POA: Diagnosis not present

## 2016-08-17 DIAGNOSIS — M79641 Pain in right hand: Secondary | ICD-10-CM | POA: Diagnosis not present

## 2016-08-17 DIAGNOSIS — M75111 Incomplete rotator cuff tear or rupture of right shoulder, not specified as traumatic: Secondary | ICD-10-CM | POA: Diagnosis not present

## 2016-08-17 DIAGNOSIS — M6281 Muscle weakness (generalized): Secondary | ICD-10-CM | POA: Diagnosis not present

## 2016-08-17 DIAGNOSIS — M25511 Pain in right shoulder: Secondary | ICD-10-CM | POA: Diagnosis not present

## 2016-08-22 DIAGNOSIS — M6281 Muscle weakness (generalized): Secondary | ICD-10-CM | POA: Diagnosis not present

## 2016-08-22 DIAGNOSIS — M75111 Incomplete rotator cuff tear or rupture of right shoulder, not specified as traumatic: Secondary | ICD-10-CM | POA: Diagnosis not present

## 2016-08-22 DIAGNOSIS — M25511 Pain in right shoulder: Secondary | ICD-10-CM | POA: Diagnosis not present

## 2016-08-22 DIAGNOSIS — M79641 Pain in right hand: Secondary | ICD-10-CM | POA: Diagnosis not present

## 2016-08-29 ENCOUNTER — Ambulatory Visit: Payer: Medicare Other | Attending: Internal Medicine | Admitting: Internal Medicine

## 2016-08-29 ENCOUNTER — Encounter: Payer: Self-pay | Admitting: Internal Medicine

## 2016-08-29 VITALS — BP 112/71 | HR 78 | Temp 98.3°F | Resp 16 | Wt 190.0 lb

## 2016-08-29 DIAGNOSIS — E785 Hyperlipidemia, unspecified: Secondary | ICD-10-CM | POA: Insufficient documentation

## 2016-08-29 DIAGNOSIS — Z880 Allergy status to penicillin: Secondary | ICD-10-CM | POA: Insufficient documentation

## 2016-08-29 DIAGNOSIS — Z720 Tobacco use: Secondary | ICD-10-CM | POA: Diagnosis not present

## 2016-08-29 DIAGNOSIS — Z716 Tobacco abuse counseling: Secondary | ICD-10-CM | POA: Insufficient documentation

## 2016-08-29 DIAGNOSIS — Z9889 Other specified postprocedural states: Secondary | ICD-10-CM | POA: Diagnosis not present

## 2016-08-29 DIAGNOSIS — Z8249 Family history of ischemic heart disease and other diseases of the circulatory system: Secondary | ICD-10-CM | POA: Diagnosis not present

## 2016-08-29 DIAGNOSIS — Z7982 Long term (current) use of aspirin: Secondary | ICD-10-CM | POA: Insufficient documentation

## 2016-08-29 DIAGNOSIS — I10 Essential (primary) hypertension: Secondary | ICD-10-CM

## 2016-08-29 DIAGNOSIS — F5101 Primary insomnia: Secondary | ICD-10-CM | POA: Diagnosis not present

## 2016-08-29 DIAGNOSIS — M1711 Unilateral primary osteoarthritis, right knee: Secondary | ICD-10-CM | POA: Diagnosis not present

## 2016-08-29 DIAGNOSIS — G5601 Carpal tunnel syndrome, right upper limb: Secondary | ICD-10-CM | POA: Insufficient documentation

## 2016-08-29 DIAGNOSIS — Z90710 Acquired absence of both cervix and uterus: Secondary | ICD-10-CM | POA: Diagnosis present

## 2016-08-29 DIAGNOSIS — E559 Vitamin D deficiency, unspecified: Secondary | ICD-10-CM

## 2016-08-29 DIAGNOSIS — L301 Dyshidrosis [pompholyx]: Secondary | ICD-10-CM | POA: Insufficient documentation

## 2016-08-29 DIAGNOSIS — J449 Chronic obstructive pulmonary disease, unspecified: Secondary | ICD-10-CM

## 2016-08-29 DIAGNOSIS — F17210 Nicotine dependence, cigarettes, uncomplicated: Secondary | ICD-10-CM | POA: Insufficient documentation

## 2016-08-29 DIAGNOSIS — Z96651 Presence of right artificial knee joint: Secondary | ICD-10-CM | POA: Insufficient documentation

## 2016-08-29 DIAGNOSIS — M542 Cervicalgia: Secondary | ICD-10-CM | POA: Diagnosis not present

## 2016-08-29 DIAGNOSIS — Z9049 Acquired absence of other specified parts of digestive tract: Secondary | ICD-10-CM | POA: Diagnosis present

## 2016-08-29 MED FILL — VIT D2 1.25 MG (50,000 UNIT: 1.25 MG | 28 days supply | Qty: 4 | Fill #1

## 2016-08-29 MED FILL — CROMOLYN 4% EYE DROPS: 4 | 25 days supply | Qty: 10 | Fill #2

## 2016-08-29 NOTE — Patient Instructions (Addendum)
Purchase Melatonin 3 mg over-the-counter and take 1 tablet before bedtime every evening. Try to get in bed around the same time every night. Turn off or lights and sounds once you get in bed. Avoid drinking caffeinated beverages after 5 PM.  Use the Advair inhaler twice a day as prescribed. Use the Ventolin inhaler as needed.  Purchase vitamin D 1000 IU capsules and take one daily.    Call [email or web address]-[email or web address] to  requests free nicotine patches.

## 2016-08-29 NOTE — Progress Notes (Signed)
Pt has questions regarding her VIt D, cough medicine  Pt states the sleep the medicine is not helping

## 2016-08-29 NOTE — Progress Notes (Signed)
Patient ID: Alison Ford, female    DOB: May 19, 1955  MRN: [id number]  CC: re-establish and Nicotine Dependence   Subjective: Alison Ford is a 61 y.o. female who presents to est care with me as PCP.  Last saw [facility] 05/2016 Her concerns today include:  Pt with hx of  HTN, HL, tob dep, CODP, Vit D def and s/p RT rotator cuff repair and RT CTS release.   1. Having problems sleeping at nights -gets in bed 9:30 p.m and watches TV for 1-1.5 hr. Doze off around 10:45 p.m. TV has timer that cuts off after 2 hrs.s.  Sleep for about 1-2 hrs then awakes again. She gets up and cook, clean then tries to sleep again.  -given Ambien in past but did not help -drinks 3 cups of coffee a day - 2 in a.m and sometimes one at 12 p.m.  Then again 2-3 a.m in morning  2.RT knee pain:  will need TKR on RT knee. Had it done in 2014 but has to be redone because it is loose and painful   3. HTN -compliant with meds Some SOB at times which she atrribuets to cig smoking  4. COPD: -+cough when she lays down. "hard dry cough all my life." -some wheezing at times. Feels" Climer rattle in chest" -Using Advair twice a wk, Ventilin 1-3 times a day -plans to quit smoking the last wk of Sept.  Currently 1/2 pk a day.   5. Vit D  Taking Vit D once a wk.  Has 3 left  Patient Active Problem List   Diagnosis Date Noted  . Chronic obstructive pulmonary disease ([place]) 05/30/2016  . Rotator cuff tear 05/02/2016  . Rotator cuff tendonitis, right 12/30/2015  . Cervicalgia 12/30/2015  . Eczema, dyshidrotic 11/18/2014  . Acne 11/18/2014  . Smoker 06/01/2014  . Right knee DJD   . Hyperlipidemia   . HTN (hypertension) 06/05/2012     Current Outpatient Prescriptions on File Prior to Visit  Medication Sig Dispense Refill  . amitriptyline (ELAVIL) 25 MG tablet Take 1 tablet (25 mg total) by mouth at bedtime as needed for sleep. 30 tablet 1  . amLODipine (NORVASC) 10 MG tablet Take 1 tablet (10 mg total) by mouth  daily. 90 tablet 3  . aspirin EC 81 MG tablet Take 1 tablet (81 mg total) by mouth daily. 90 tablet 3  . Fluticasone-Salmeterol (ADVAIR) 100-50 MCG/DOSE AEPB Inhale 1 puff into the lungs 2 (two) times daily. 1 each 3  . lisinopril-hydrochlorothiazide (PRINZIDE,ZESTORETIC) 20-25 MG tablet Take 1 tablet by mouth daily. 90 tablet 3  . lovastatin (MEVACOR) 40 MG tablet Take 1 tablet (40 mg total) by mouth at bedtime. 90 tablet 3  . polyethylene glycol powder (GLYCOLAX/MIRALAX) powder Take 17 g by mouth daily as needed for moderate constipation. 255 g 3  . tiZANidine (ZANAFLEX) 4 MG tablet Take 1 tablet (4 mg total) by mouth every 6 (six) hours as needed for muscle spasms. 30 tablet 0  . VENTOLIN HFA 108 (90 Base) MCG/ACT inhaler Inhale 2 puffs into the lungs every 6 (six) hours as needed for wheezing or shortness of breath. 1 Inhaler 2  . Vitamin D, Ergocalciferol, (DRISDOL) 50000 units CAPS capsule Take 1 capsule (50,000 Units total) by mouth every 7 (seven) days. 12 capsule 0   No current facility-administered medications on file prior to visit.     Allergies  Allergen Reactions  . Penicillins Itching and Other (See Comments)  Burning and whelps Has patient had a PCN reaction causing immediate rash, facial/tongue/throat swelling, SOB or lightheadedness with hypotension: NO Has patient had a PCN reaction causing severe rash involving mucus membranes or skin necrosis: NO Has patient had a PCN reaction that required hospitalization: NO Has patient had a PCN reaction occurring within the last 10 years: NO If all of the above answers are "NO", then may proceed with Cephalosporin use.    Social History   Social History  . Marital status: Legally Separated    Spouse name: N/A  . Number of children: 3  . Years of education: N/A   Occupational History  .  [employer]   Social History Main Topics  . Smoking status: Current Every Day Smoker    Packs/day: 0.50  . Smokeless tobacco: Never Used       Comment: Smoking 3-4 cigs/day  . Alcohol use 0.0 oz/week     Comment: ocassionally  . Drug use: No  . Sexual activity: No   Other Topics Concern  . Not on file   Social History Narrative   Lives with daughter and two grandsons.     Family History  Problem Relation Age of Onset  . CAD Father 40  . CAD Mother 37  . Hypertension Mother     Past Surgical History:  Procedure Laterality Date  . ABDOMINAL HYSTERECTOMY    . Athroscopic knee surgery     Bilateral  . CARPAL TUNNEL RELEASE Right 05/02/2016   Procedure: CARPAL TUNNEL RELEASE;  Surgeon: Meredith Pel, MD;  Location: [facility];  Service: Orthopedics;  Laterality: Right;  . CHOLECYSTECTOMY    . DILATION AND CURETTAGE OF UTERUS    . EYE SURGERY Right   . SHOULDER ARTHROSCOPY WITH SUBACROMIAL DECOMPRESSION Right 05/02/2016   Procedure: RIGHT SHOULDER ARTHROSCOPY WITH SUBACROMIAL DECOMPRESSION, MINI OPEN ROTATOR CUFF TEAR REPAIR, AND RIGHT CARPAL TUNNEL RELEASE;  Surgeon: Meredith Pel, MD;  Location: [facility];  Service: Orthopedics;  Laterality: Right;  . TOTAL KNEE ARTHROPLASTY Right 07/15/2012   Procedure: RIGHT TOTAL KNEE ARTHROPLASTY;  Surgeon: Lorn Junes, MD;  Location: [facility];  Service: Orthopedics;  Laterality: Right;    ROS: Review of Systems Neg except as stated above  PHYSICAL EXAM: BP 112/71   Pulse 78   Temp 98.3 F (36.8 C) (Oral)   Resp 16   Wt 190 lb (86.2 kg)   SpO2 97%   BMI 29.32 kg/m   Physical Exam  General appearance - alert, well appearing, middle age to older AAF and in no distress Mental status - alert, oriented to person, place, and time, normal mood, behavior, speech, dress, motor activity, and thought processes Mouth - mucous membranes moist, pharynx normal without lesions Neck - supple, no significant adenopathy Chest - clear to auscultation, no wheezes, rales or rhonchi, symmetric air entry Heart - normal rate, regular rhythm, normal S1, S2, no murmurs, rubs, clicks or  gallops Extremities - peripheral pulses normal, no pedal edema, no clubbing or cyanosis   Depression screen PHQ 2/9 08/29/2016  Decreased Interest 1  Down, Depressed, Hopeless 0  PHQ - 2 Score 1  Altered sleeping -  Tired, decreased energy -  Change in appetite -  Feeling bad or failure about yourself  -  Trouble concentrating -  Moving slowly or fidgety/restless -  Suicidal thoughts -  PHQ-9 Score -  Difficult doing work/chores -    ASSESSMENT AND PLAN: 1. Primary insomnia Sleep hygiene discussed Try to get in  bed around the same time every night. Turn off or lights and sounds once you get in bed. Avoid drinking caffeinated beverages after 5 PM. -try Melatonin 3 mg OTC  2. Osteoarthritis of right knee, unspecified osteoarthritis type Followed by Ortho  3. Essential hypertension -at goal  4. Tobacco use Patient advised to quit smoking. Discussed health risks associated with smoking including lung and other types of cancers, chronic lung diseases and CV risks.. Pt has set quit date.  Discussed methods to help quit including quitting cold [place], use of NRT, Chantix and Bupropion. She will call 1-800QUITNOW for patches 3 mins spent counseling   5. Chronic obstructive pulmonary disease, unspecified COPD type ([place]) Not adequately controlled Advise BID dosing of adair Smoking cessation encouraged  6. Vitamin D deficiency Complete high dose vitamin d then purchase 1000 IU OTC  Patient was given the opportunity to ask questions.  Patient verbalized understanding of the plan and was able to repeat key elements of the plan.   No orders of the defined types were placed in this encounter.    Requested Prescriptions    No prescriptions requested or ordered in this encounter    Return in about 4 months (around 12/29/2016).  Karle Plumber, MD, FACP

## 2016-08-31 DIAGNOSIS — M79641 Pain in right hand: Secondary | ICD-10-CM | POA: Diagnosis not present

## 2016-08-31 DIAGNOSIS — M6281 Muscle weakness (generalized): Secondary | ICD-10-CM | POA: Diagnosis not present

## 2016-08-31 DIAGNOSIS — M25511 Pain in right shoulder: Secondary | ICD-10-CM | POA: Diagnosis not present

## 2016-08-31 DIAGNOSIS — M75111 Incomplete rotator cuff tear or rupture of right shoulder, not specified as traumatic: Secondary | ICD-10-CM | POA: Diagnosis not present

## 2016-09-01 ENCOUNTER — Telehealth (INDEPENDENT_AMBULATORY_CARE_PROVIDER_SITE_OTHER): Payer: Self-pay

## 2016-09-01 MED ORDER — HYDROCODONE-ACETAMINOPHEN 5-325 MG PO TABS: 1.0000 | ORAL_TABLET | Freq: Four times a day (QID) | ORAL | 0 refills | Status: DC | PRN

## 2016-09-01 NOTE — Addendum Note (Signed)
Addended byLaurann Montana on: 09/01/2016 12:45 PM   Modules accepted: Orders

## 2016-09-01 NOTE — Telephone Encounter (Signed)
Can you please advise since Dr Marlou Sa out of office?

## 2016-09-01 NOTE — Telephone Encounter (Signed)
Patient would like to have Rx refill on Hydrocodone.  Cb# is [phone]. Please Advise.  Thank You.

## 2016-09-01 NOTE — Telephone Encounter (Signed)
rx written and put up front. Called patient and LM advising could pick up rx at front desk.

## 2016-09-01 NOTE — Telephone Encounter (Signed)

## 2016-09-05 ENCOUNTER — Telehealth (INDEPENDENT_AMBULATORY_CARE_PROVIDER_SITE_OTHER): Payer: Self-pay | Admitting: Orthopedic Surgery

## 2016-09-05 NOTE — Telephone Encounter (Signed)
Returned call to patient left message to call back concerning her Rx

## 2016-09-07 DIAGNOSIS — M75111 Incomplete rotator cuff tear or rupture of right shoulder, not specified as traumatic: Secondary | ICD-10-CM | POA: Diagnosis not present

## 2016-09-07 DIAGNOSIS — M6281 Muscle weakness (generalized): Secondary | ICD-10-CM | POA: Diagnosis not present

## 2016-09-07 DIAGNOSIS — M79641 Pain in right hand: Secondary | ICD-10-CM | POA: Diagnosis not present

## 2016-09-07 DIAGNOSIS — M25511 Pain in right shoulder: Secondary | ICD-10-CM | POA: Diagnosis not present

## 2016-09-12 MED FILL — LOVASTATIN 40 MG TABLET: 40 | 30 days supply | Qty: 30 | Fill #3

## 2016-09-13 DIAGNOSIS — H40013 Open angle with borderline findings, low risk, bilateral: Secondary | ICD-10-CM | POA: Diagnosis not present

## 2016-09-13 DIAGNOSIS — H10413 Chronic giant papillary conjunctivitis, bilateral: Secondary | ICD-10-CM | POA: Diagnosis not present

## 2016-09-13 DIAGNOSIS — H2513 Age-related nuclear cataract, bilateral: Secondary | ICD-10-CM | POA: Diagnosis not present

## 2016-09-13 DIAGNOSIS — H04123 Dry eye syndrome of bilateral lacrimal glands: Secondary | ICD-10-CM | POA: Diagnosis not present

## 2016-09-21 DIAGNOSIS — M6281 Muscle weakness (generalized): Secondary | ICD-10-CM | POA: Diagnosis not present

## 2016-09-21 DIAGNOSIS — M79641 Pain in right hand: Secondary | ICD-10-CM | POA: Diagnosis not present

## 2016-09-21 DIAGNOSIS — M75111 Incomplete rotator cuff tear or rupture of right shoulder, not specified as traumatic: Secondary | ICD-10-CM | POA: Diagnosis not present

## 2016-09-21 DIAGNOSIS — M25511 Pain in right shoulder: Secondary | ICD-10-CM | POA: Diagnosis not present

## 2016-10-05 ENCOUNTER — Telehealth (INDEPENDENT_AMBULATORY_CARE_PROVIDER_SITE_OTHER): Payer: Self-pay | Admitting: Orthopedic Surgery

## 2016-10-05 DIAGNOSIS — M79641 Pain in right hand: Secondary | ICD-10-CM | POA: Diagnosis not present

## 2016-10-05 DIAGNOSIS — M75111 Incomplete rotator cuff tear or rupture of right shoulder, not specified as traumatic: Secondary | ICD-10-CM | POA: Diagnosis not present

## 2016-10-05 DIAGNOSIS — M25511 Pain in right shoulder: Secondary | ICD-10-CM | POA: Diagnosis not present

## 2016-10-05 DIAGNOSIS — Z96651 Presence of right artificial knee joint: Secondary | ICD-10-CM

## 2016-10-05 DIAGNOSIS — M6281 Muscle weakness (generalized): Secondary | ICD-10-CM | POA: Diagnosis not present

## 2016-10-05 NOTE — Addendum Note (Signed)
Addended byLaurann Montana on: 10/05/2016 03:53 PM   Modules accepted: Orders

## 2016-10-05 NOTE — Telephone Encounter (Signed)
Please advise thanks.

## 2016-10-05 NOTE — Telephone Encounter (Signed)
Referral submitted. Patient advised done.

## 2016-10-05 NOTE — Telephone Encounter (Signed)
Patient called asking if she could possibly get PT for her knee, she's currently in a lot of pain. Still in therapy for her shoulder. CB # [phone]

## 2016-10-05 NOTE — Telephone Encounter (Signed)
Ok for that

## 2016-10-06 ENCOUNTER — Telehealth (INDEPENDENT_AMBULATORY_CARE_PROVIDER_SITE_OTHER): Payer: Self-pay | Admitting: Orthopedic Surgery

## 2016-10-06 NOTE — Telephone Encounter (Signed)
Patient called requesting to speak to Dr. Marlou Sa.  CB#[phone].  Thank you.

## 2016-10-09 NOTE — Telephone Encounter (Signed)
Need rov first we did not do that knee

## 2016-10-09 NOTE — Telephone Encounter (Signed)
IC s/w patient. She had called last week and asked for referral for her knee pain, we made referral for her. However, she now is asking for pain medication refill. After speaking with her at length, she let me know it actually is bilateral knee pain but right is worse than left. Hx of RT TKA in 2014. C/o acute onset of pain without injury. Do you want her to come in this week to have it checked or ok just to proceed with P.T. And refill on pain meds. Please advise thanks.

## 2016-10-09 NOTE — Telephone Encounter (Signed)
appt made for pt

## 2016-10-10 ENCOUNTER — Telehealth (INDEPENDENT_AMBULATORY_CARE_PROVIDER_SITE_OTHER): Payer: Self-pay

## 2016-10-10 NOTE — Telephone Encounter (Signed)
Patient would like a call back from you.  Cb# is [phone].  Please advise. Thank you.

## 2016-10-10 NOTE — Telephone Encounter (Signed)
Tried calling patient back. No answer. LMVM for her advising was RC.

## 2016-10-16 ENCOUNTER — Ambulatory Visit: Payer: Medicare Other | Attending: Internal Medicine | Admitting: Physical Therapy

## 2016-10-18 ENCOUNTER — Other Ambulatory Visit: Payer: Self-pay

## 2016-10-18 MED ORDER — FLUTICASONE-SALMETEROL 100-50 MCG/DOSE IN AEPB: 1.0000 | INHALATION_SPRAY | Freq: Two times a day (BID) | RESPIRATORY_TRACT | 3 refills | Status: DC

## 2016-10-18 MED FILL — CROMOLYN 4% EYE DROPS: 4 | 25 days supply | Qty: 10 | Fill #3

## 2016-10-18 MED FILL — **ADVAIR 100/50 DISKUS: 100-50 MCG | 30 days supply | Qty: 60 | Fill #0

## 2016-10-18 MED FILL — VIT D2 1.25 MG (50,000 UNIT: 1.25 MG | 28 days supply | Qty: 4 | Fill #2

## 2016-10-19 ENCOUNTER — Ambulatory Visit (INDEPENDENT_AMBULATORY_CARE_PROVIDER_SITE_OTHER): Payer: Medicare Other | Admitting: Orthopedic Surgery

## 2016-10-19 DIAGNOSIS — M6281 Muscle weakness (generalized): Secondary | ICD-10-CM | POA: Diagnosis not present

## 2016-10-19 DIAGNOSIS — M75111 Incomplete rotator cuff tear or rupture of right shoulder, not specified as traumatic: Secondary | ICD-10-CM | POA: Diagnosis not present

## 2016-10-19 DIAGNOSIS — M79641 Pain in right hand: Secondary | ICD-10-CM | POA: Diagnosis not present

## 2016-10-19 DIAGNOSIS — M25511 Pain in right shoulder: Secondary | ICD-10-CM | POA: Diagnosis not present

## 2016-10-20 ENCOUNTER — Encounter (INDEPENDENT_AMBULATORY_CARE_PROVIDER_SITE_OTHER): Payer: Self-pay | Admitting: Orthopedic Surgery

## 2016-10-20 ENCOUNTER — Ambulatory Visit (INDEPENDENT_AMBULATORY_CARE_PROVIDER_SITE_OTHER): Payer: Medicare Other

## 2016-10-20 ENCOUNTER — Ambulatory Visit (INDEPENDENT_AMBULATORY_CARE_PROVIDER_SITE_OTHER): Payer: Medicare Other | Admitting: Orthopedic Surgery

## 2016-10-20 DIAGNOSIS — Z96651 Presence of right artificial knee joint: Secondary | ICD-10-CM | POA: Diagnosis not present

## 2016-10-20 DIAGNOSIS — M25562 Pain in left knee: Secondary | ICD-10-CM

## 2016-10-20 DIAGNOSIS — G8929 Other chronic pain: Secondary | ICD-10-CM | POA: Diagnosis not present

## 2016-10-20 DIAGNOSIS — M25561 Pain in right knee: Secondary | ICD-10-CM

## 2016-10-20 MED ORDER — ACETAMINOPHEN-CODEINE #3 300-30 MG PO TABS: ORAL_TABLET | ORAL | 0 refills | Status: DC

## 2016-10-20 NOTE — Progress Notes (Signed)
Office Visit Note   Patient: Alison Ford           Date of Birth: [birth year]           MRN: [id number] Visit Date: 10/20/2016 Requested by: Ladell Pier, MD [place], [place] [address] PCP: Ladell Pier, MD  Subjective: Chief Complaint  Patient presents with  . Left Knee - Pain  . Right Knee - Pain  Kista is a 61 year old patient with bilateral knee pain right worse than left.  She had a right total knee replacement done in 2014 by another physician.  She reports difficulty walking with constant pain stiffness weakness and giving way.  She ambulates with a cane.  She also has some left knee pain and has arthritis on this side.  She wants to get a physical therapy.  She states her right shoulder is doing generally better but she's working on strength training and going to physical therapy.  HPI: See above              ROS: All systems reviewed are negative as they relate to the chief complaint within the history of present illness.  Patient denies  fevers or chills.   Assessment & Plan: Visit Diagnoses:  1. Chronic pain of both knees     Plan: Impression is possibly loose right total knee replacement based on some changes at the bone cement interface on both the femoral and tibial side.  This is going on in the right knee.  On the left knee she has progressive arthritis.  When I discussed with her options for treatment she states that the right knee is hurting enough that she would consider revision surgery if they could help.  Therefore I think a bone scan to evaluate loosening of the right TKA as indicated.  No real symptoms of warmth or effusion to suggest infection but I would also be a consideration.  I'll put her in physical therapy and do a one time prescription for Tylenol 3.  See her back after the bone scan  Follow-Up Instructions: No Follow-up on file.   Orders:  Orders Placed This Encounter  Procedures  . XR KNEE 3 VIEW RIGHT  . XR KNEE  3 VIEW LEFT   No orders of the defined types were placed in this encounter.     Procedures: No procedures performed   Clinical Data: No additional findings.  Objective: Vital Signs: There were no vitals taken for this visit.  Physical Exam:   Constitutional: Patient appears well-developed HEENT:  Head: Normocephalic Eyes:EOM are normal Neck: Normal range of motion Cardiovascular: Normal rate Pulmonary/chest: Effort normal Neurologic: Patient is alert Skin: Skin is warm Psychiatric: Patient has normal mood and affect    Ortho Exam: Orthopedic exam demonstrates no effusion in either knee.  No warmth to either knee.  Collaterals are stable bilaterally.  Range of motion on the right knee is full extension to about 105 of flexion.  Left knee has a Shibley bit more flexion.  Pedal pulses palpable.  No groin pain with internal/external rotation of either leg.  Right shoulder has pretty reasonable range of motion with good strength and not much in the way of crepitus.  Does have a Vanleeuwen burning pain around the incision.  Specialty Comments:  No specialty comments available.  Imaging: Xr Knee 3 View Left  Result Date: 10/20/2016 AP lateral merchant view left knee reviewed.  Tricompartmental osteoarthritic changes noted worse in the lateral compartment.  Patella is localized and well centered.  Calcification within the popliteal vessel is noted.  Xr Knee 3 View Right  Result Date: 10/20/2016 AP lateral merchant right knee reviewed.  There does appear to be some lucency on the medial aspect of the bone cement interface on the tibial side.  No fracture or significant malalignment is observed.  Patella well centered in the trochlea.  Popliteal vessel calcification is noted.  There is also some lucency around the visible cement on the lateral view of the distal femur.    PMFS History: Patient Active Problem List   Diagnosis Date Noted  . Chronic obstructive pulmonary disease ([place])  05/30/2016  . Rotator cuff tear 05/02/2016  . Rotator cuff tendonitis, right 12/30/2015  . Cervicalgia 12/30/2015  . Eczema, dyshidrotic 11/18/2014  . Acne 11/18/2014  . Smoker 06/01/2014  . Right knee DJD   . Hyperlipidemia   . HTN (hypertension) 06/05/2012   Past Medical History:  Diagnosis Date  . Abnormal facial hair   . Back pain    d/t knee pain  . Headache(784.0)   . Heart murmur    slight  . HTN (hypertension)    takes Amlodipine daily and HCTZ   . Hyperlipidemia    takes Lovastatin nightly  . Joint pain   . Right knee DJD   . Weakness    left hand    Family History  Problem Relation Age of Onset  . CAD Father 78  . CAD Mother 24  . Hypertension Mother     Past Surgical History:  Procedure Laterality Date  . ABDOMINAL HYSTERECTOMY    . Athroscopic knee surgery     Bilateral  . CARPAL TUNNEL RELEASE Right 05/02/2016   Procedure: CARPAL TUNNEL RELEASE;  Surgeon: Meredith Pel, MD;  Location: [facility];  Service: Orthopedics;  Laterality: Right;  . CHOLECYSTECTOMY    . DILATION AND CURETTAGE OF UTERUS    . EYE SURGERY Right   . SHOULDER ARTHROSCOPY WITH SUBACROMIAL DECOMPRESSION Right 05/02/2016   Procedure: RIGHT SHOULDER ARTHROSCOPY WITH SUBACROMIAL DECOMPRESSION, MINI OPEN ROTATOR CUFF TEAR REPAIR, AND RIGHT CARPAL TUNNEL RELEASE;  Surgeon: Meredith Pel, MD;  Location: [facility];  Service: Orthopedics;  Laterality: Right;  . TOTAL KNEE ARTHROPLASTY Right 07/15/2012   Procedure: RIGHT TOTAL KNEE ARTHROPLASTY;  Surgeon: Lorn Junes, MD;  Location: [facility];  Service: Orthopedics;  Laterality: Right;   Social History   Occupational History  .  [employer]   Social History Main Topics  . Smoking status: Current Every Day Smoker    Packs/day: 0.50  . Smokeless tobacco: Never Used     Comment: Smoking 3-4 cigs/day  . Alcohol use 0.0 oz/week     Comment: ocassionally  . Drug use: No  . Sexual activity: No

## 2016-10-26 ENCOUNTER — Encounter (INDEPENDENT_AMBULATORY_CARE_PROVIDER_SITE_OTHER): Payer: Self-pay

## 2016-10-26 ENCOUNTER — Ambulatory Visit (INDEPENDENT_AMBULATORY_CARE_PROVIDER_SITE_OTHER): Payer: Medicare Other | Admitting: Orthopedic Surgery

## 2016-10-30 ENCOUNTER — Ambulatory Visit (HOSPITAL_COMMUNITY)
Admission: RE | Admit: 2016-10-30 | Discharge: 2016-10-30 | Disposition: A | Discharge status: Home / Self Care | Payer: Medicare Other | Source: Ambulatory Visit | Attending: Orthopedic Surgery | Admitting: Orthopedic Surgery

## 2016-10-30 ENCOUNTER — Encounter (HOSPITAL_COMMUNITY)
Admission: RE | Admit: 2016-10-30 | Discharge: 2016-10-30 | Disposition: A | Discharge status: Home / Self Care | Payer: Medicare Other | Source: Ambulatory Visit | Attending: Orthopedic Surgery | Admitting: Orthopedic Surgery

## 2016-10-30 DIAGNOSIS — G8929 Other chronic pain: Secondary | ICD-10-CM | POA: Diagnosis not present

## 2016-10-30 DIAGNOSIS — Z96651 Presence of right artificial knee joint: Secondary | ICD-10-CM | POA: Diagnosis not present

## 2016-10-30 DIAGNOSIS — M25562 Pain in left knee: Secondary | ICD-10-CM | POA: Insufficient documentation

## 2016-10-30 DIAGNOSIS — M25561 Pain in right knee: Secondary | ICD-10-CM | POA: Diagnosis not present

## 2016-10-30 MED ORDER — TECHNETIUM TC 99M MEDRONATE IV KIT
22.0000 | PACK | Freq: Once | INTRAVENOUS | Status: AC | PRN
  Administered 2016-10-30: 22 via INTRAVENOUS

## 2016-11-01 ENCOUNTER — Encounter (INDEPENDENT_AMBULATORY_CARE_PROVIDER_SITE_OTHER): Payer: Self-pay | Admitting: Orthopedic Surgery

## 2016-11-01 ENCOUNTER — Ambulatory Visit (INDEPENDENT_AMBULATORY_CARE_PROVIDER_SITE_OTHER): Payer: Medicare Other | Admitting: Orthopedic Surgery

## 2016-11-01 DIAGNOSIS — T84038S Mechanical loosening of other internal prosthetic joint, sequela: Secondary | ICD-10-CM

## 2016-11-01 DIAGNOSIS — M6281 Muscle weakness (generalized): Secondary | ICD-10-CM | POA: Diagnosis not present

## 2016-11-01 DIAGNOSIS — M25561 Pain in right knee: Secondary | ICD-10-CM | POA: Diagnosis not present

## 2016-11-01 DIAGNOSIS — M25511 Pain in right shoulder: Secondary | ICD-10-CM | POA: Diagnosis not present

## 2016-11-01 DIAGNOSIS — Z96659 Presence of unspecified artificial knee joint: Secondary | ICD-10-CM

## 2016-11-01 DIAGNOSIS — M25562 Pain in left knee: Secondary | ICD-10-CM | POA: Diagnosis not present

## 2016-11-01 NOTE — Progress Notes (Signed)
Office Visit Note   Patient: Alison Ford           Date of Birth: 04/17/1955           MRN: [id number] Visit Date: 11/01/2016 Requested by: Ladell Pier, MD [place], [place] [id number] PCP: Ladell Pier, MD  Subjective: Chief Complaint  Patient presents with  . Follow-up    bone scan results    HPI: Alison Ford is a 61 year old female with right knee pain.  Since I have seen her she has had a bone scan.  She also had CT scan of the right knee in 2016 which suggested loosening of the tibial component.  Bone scan is reviewed with the patient and it suggests increased uptake consistent with either loosening or infection of both the tibial and femoral components.  She reports pain with ambulation. She denies any fevers or chills.  Knee was placed in 2014 by another orthopedic surgeon.              ROS: All systems reviewed are negative as they relate to the chief complaint within the history of present illness.  Patient denies  fevers or chills.   Assessment & Plan: Visit Diagnoses: No diagnosis found.  Plan: impression is right loose total knee replacement.  Plan is for Alison Ford to continue managing how she has been managing.  She doesn't believe her clinical symptoms  Major intervention at this time.  I will see her back in 6 months with repeat plain radiographs for clid radiographic reevaluation and decision at that time for or against revision  Follow-Up Instructions: Return in about 6 months (around 05/02/2017).   Orders:  No orders of the defined types were placed in this encounter.  No orders of the defined types were placed in this encounter.     Procedures: No procedures performed   Clinical Data: No additional findings.  Objective: Vital Signs: There were no vitals taken for this visit.  Physical Exam:   Constitutional: Patient appears well-developed HEENT:  Head: Normocephalic Eyes:EOM are normal Neck: Normal range of  motion Cardiovascular: Normal rate Pulmonary/chest: Effort normal Neurologic: Patient is alert Skin: Skin is warm Psychiatric: Patient has normal mood and affect    Ortho Exam: orthopedic exam demonstrates excellent range of motion of the right knee with palpable pedal pulses no warmth or effusion in the right knee no proximal lymphadenopathy intact extensor mechanism stable collateral ligaments at 0 and 30  Specialty Comments:  No specialty comments available.  Imaging: No results found.   PMFS History: Patient Active Problem List   Diagnosis Date Noted  . Chronic obstructive pulmonary disease ([place]) 05/30/2016  . Rotator cuff tear 05/02/2016  . Rotator cuff tendonitis, right 12/30/2015  . Cervicalgia 12/30/2015  . Eczema, dyshidrotic 11/18/2014  . Acne 11/18/2014  . Smoker 06/01/2014  . Right knee DJD   . Hyperlipidemia   . HTN (hypertension) 06/05/2012   Past Medical History:  Diagnosis Date  . Abnormal facial hair   . Back pain    d/t knee pain  . Headache(784.0)   . Heart murmur    slight  . HTN (hypertension)    takes Amlodipine daily and HCTZ   . Hyperlipidemia    takes Lovastatin nightly  . Joint pain   . Right knee DJD   . Weakness    left hand    Family History  Problem Relation Age of Onset  . CAD Father 54  . CAD Mother  60  . Hypertension Mother     Past Surgical History:  Procedure Laterality Date  . ABDOMINAL HYSTERECTOMY    . Athroscopic knee surgery     Bilateral  . CARPAL TUNNEL RELEASE Right 05/02/2016   Procedure: CARPAL TUNNEL RELEASE;  Surgeon: Meredith Pel, MD;  Location: [facility];  Service: Orthopedics;  Laterality: Right;  . CHOLECYSTECTOMY    . DILATION AND CURETTAGE OF UTERUS    . EYE SURGERY Right   . SHOULDER ARTHROSCOPY WITH SUBACROMIAL DECOMPRESSION Right 05/02/2016   Procedure: RIGHT SHOULDER ARTHROSCOPY WITH SUBACROMIAL DECOMPRESSION, MINI OPEN ROTATOR CUFF TEAR REPAIR, AND RIGHT CARPAL TUNNEL RELEASE;  Surgeon: Meredith Pel, MD;  Location: [facility];  Service: Orthopedics;  Laterality: Right;  . TOTAL KNEE ARTHROPLASTY Right 07/15/2012   Procedure: RIGHT TOTAL KNEE ARTHROPLASTY;  Surgeon: Lorn Junes, MD;  Location: [facility];  Service: Orthopedics;  Laterality: Right;   Social History   Occupational History  .  [employer]   Social History Main Topics  . Smoking status: Current Every Day Smoker    Packs/day: 0.50  . Smokeless tobacco: Never Used     Comment: Smoking 3-4 cigs/day  . Alcohol use 0.0 oz/week     Comment: ocassionally  . Drug use: No  . Sexual activity: No

## 2016-11-09 DIAGNOSIS — M25562 Pain in left knee: Secondary | ICD-10-CM | POA: Diagnosis not present

## 2016-11-09 DIAGNOSIS — M25561 Pain in right knee: Secondary | ICD-10-CM | POA: Diagnosis not present

## 2016-11-09 DIAGNOSIS — M25511 Pain in right shoulder: Secondary | ICD-10-CM | POA: Diagnosis not present

## 2016-11-09 DIAGNOSIS — M6281 Muscle weakness (generalized): Secondary | ICD-10-CM | POA: Diagnosis not present

## 2016-11-10 ENCOUNTER — Other Ambulatory Visit: Payer: Self-pay | Admitting: Internal Medicine

## 2016-11-10 DIAGNOSIS — Z0000 Encounter for general adult medical examination without abnormal findings: Secondary | ICD-10-CM

## 2016-11-20 ENCOUNTER — Other Ambulatory Visit: Payer: Self-pay

## 2016-11-20 MED ORDER — LOVASTATIN 40 MG PO TABS: 40.0000 mg | ORAL_TABLET | Freq: Every day | ORAL | 3 refills | Status: DC

## 2016-11-20 MED ORDER — VENTOLIN HFA 108 (90 BASE) MCG/ACT IN AERS: 2.0000 | INHALATION_SPRAY | Freq: Four times a day (QID) | RESPIRATORY_TRACT | 2 refills | Status: DC | PRN

## 2016-11-20 MED FILL — LOVASTATIN 40 MG TABLET: 40 | 30 days supply | Qty: 30 | Fill #0

## 2016-11-20 MED FILL — VENTOLIN HFA 90 MCG INHALER: 108 (90 BAS | 25 days supply | Qty: 18 | Fill #0

## 2016-11-21 DIAGNOSIS — M25562 Pain in left knee: Secondary | ICD-10-CM | POA: Diagnosis not present

## 2016-11-21 DIAGNOSIS — M25561 Pain in right knee: Secondary | ICD-10-CM | POA: Diagnosis not present

## 2016-11-21 DIAGNOSIS — M25511 Pain in right shoulder: Secondary | ICD-10-CM | POA: Diagnosis not present

## 2016-11-21 DIAGNOSIS — M6281 Muscle weakness (generalized): Secondary | ICD-10-CM | POA: Diagnosis not present

## 2016-11-27 ENCOUNTER — Telehealth: Payer: Self-pay | Admitting: Internal Medicine

## 2016-11-27 NOTE — Telephone Encounter (Signed)
Pt call to request a refill for Vitamin D, Ergocalciferol, (DRISDOL) 50000 units CAPS capsule   please sent it to the pharmacy [facility] pharmacy, please follow up

## 2016-11-27 NOTE — Telephone Encounter (Signed)
This is typically not a chronic medication, will forward to PCP

## 2016-11-28 NOTE — Telephone Encounter (Signed)
Pt is aware and doesn't have any questions or concerns  

## 2016-11-30 DIAGNOSIS — M25561 Pain in right knee: Secondary | ICD-10-CM | POA: Diagnosis not present

## 2016-11-30 DIAGNOSIS — M25562 Pain in left knee: Secondary | ICD-10-CM | POA: Diagnosis not present

## 2016-11-30 DIAGNOSIS — M6281 Muscle weakness (generalized): Secondary | ICD-10-CM | POA: Diagnosis not present

## 2016-11-30 DIAGNOSIS — M25511 Pain in right shoulder: Secondary | ICD-10-CM | POA: Diagnosis not present

## 2016-12-01 ENCOUNTER — Ambulatory Visit: Payer: Medicare Other

## 2016-12-05 DIAGNOSIS — M25562 Pain in left knee: Secondary | ICD-10-CM | POA: Diagnosis not present

## 2016-12-05 DIAGNOSIS — M25511 Pain in right shoulder: Secondary | ICD-10-CM | POA: Diagnosis not present

## 2016-12-05 DIAGNOSIS — M6281 Muscle weakness (generalized): Secondary | ICD-10-CM | POA: Diagnosis not present

## 2016-12-05 DIAGNOSIS — M25561 Pain in right knee: Secondary | ICD-10-CM | POA: Diagnosis not present

## 2016-12-13 ENCOUNTER — Emergency Department (HOSPITAL_COMMUNITY)
Admission: EM | Admit: 2016-12-13 | Discharge: 2016-12-13 | Disposition: A | Discharge status: Home / Self Care | Payer: Medicare Other | Attending: Emergency Medicine | Admitting: Emergency Medicine

## 2016-12-13 ENCOUNTER — Encounter (HOSPITAL_COMMUNITY): Payer: Self-pay

## 2016-12-13 ENCOUNTER — Emergency Department (HOSPITAL_COMMUNITY): Payer: Medicare Other

## 2016-12-13 DIAGNOSIS — Z79899 Other long term (current) drug therapy: Secondary | ICD-10-CM | POA: Diagnosis not present

## 2016-12-13 DIAGNOSIS — R05 Cough: Secondary | ICD-10-CM | POA: Diagnosis not present

## 2016-12-13 DIAGNOSIS — J449 Chronic obstructive pulmonary disease, unspecified: Secondary | ICD-10-CM | POA: Insufficient documentation

## 2016-12-13 DIAGNOSIS — I10 Essential (primary) hypertension: Secondary | ICD-10-CM | POA: Insufficient documentation

## 2016-12-13 DIAGNOSIS — F17200 Nicotine dependence, unspecified, uncomplicated: Secondary | ICD-10-CM | POA: Insufficient documentation

## 2016-12-13 DIAGNOSIS — J069 Acute upper respiratory infection, unspecified: Secondary | ICD-10-CM

## 2016-12-13 DIAGNOSIS — Z7982 Long term (current) use of aspirin: Secondary | ICD-10-CM | POA: Insufficient documentation

## 2016-12-13 DIAGNOSIS — B9789 Other viral agents as the cause of diseases classified elsewhere: Secondary | ICD-10-CM | POA: Diagnosis not present

## 2016-12-13 DIAGNOSIS — Z96651 Presence of right artificial knee joint: Secondary | ICD-10-CM | POA: Insufficient documentation

## 2016-12-13 MED ORDER — ALBUTEROL SULFATE HFA 108 (90 BASE) MCG/ACT IN AERS
1.0000 | INHALATION_SPRAY | RESPIRATORY_TRACT | Status: DC | PRN
  Administered 2016-12-13: 2 via RESPIRATORY_TRACT
  Filled 2016-12-13: qty 6.7

## 2016-12-13 MED ORDER — BENZONATATE 100 MG PO CAPS: 100.0000 mg | ORAL_CAPSULE | Freq: Three times a day (TID) | ORAL | 0 refills | Status: DC

## 2016-12-13 NOTE — ED Provider Notes (Signed)
[facility] EMERGENCY DEPARTMENT Provider Note   CSN: [id number] Arrival date & time: 12/13/16  1253     History   Chief Complaint Chief Complaint  Patient presents with  . Cough/congestion    HPI Alison Ford is a 61 y.o. female.  HPI   61 year old female presents stating with complaints of upper respiratory infection. Patient notes symptoms started proximal me 5 days ago with rhinorrhea, congestion and productive cough. She notes originally she was coughing up green sputum, had a low-grade fever. She notes the fever is gone away, having clear productive cough at this time. She notes a generalized chest discomfort with coughing none at baseline. Patient reports minor shortness of breath, but notes she has a history of COPD and smoking. Patient denies any history of DVT or PE, denies any lotion swelling or edema. Patient reports using Mucinex at home which has improved symptoms slightly. Patient she has run out of her albuterol inhaler.   Past Medical History:  Diagnosis Date  . Abnormal facial hair   . Back pain    d/t knee pain  . Headache(784.0)   . Heart murmur    slight  . HTN (hypertension)    takes Amlodipine daily and HCTZ   . Hyperlipidemia    takes Lovastatin nightly  . Joint pain   . Right knee DJD   . Weakness    left hand    Patient Active Problem List   Diagnosis Date Noted  . Chronic obstructive pulmonary disease ([place]) 05/30/2016  . Rotator cuff tear 05/02/2016  . Rotator cuff tendonitis, right 12/30/2015  . Cervicalgia 12/30/2015  . Eczema, dyshidrotic 11/18/2014  . Acne 11/18/2014  . Smoker 06/01/2014  . Right knee DJD   . Hyperlipidemia   . HTN (hypertension) 06/05/2012    Past Surgical History:  Procedure Laterality Date  . ABDOMINAL HYSTERECTOMY    . Athroscopic knee surgery     Bilateral  . CARPAL TUNNEL RELEASE Right 05/02/2016   Procedure: CARPAL TUNNEL RELEASE;  Surgeon: Meredith Pel, MD;  Location: [facility];  Service: Orthopedics;  Laterality: Right;  . CHOLECYSTECTOMY    . DILATION AND CURETTAGE OF UTERUS    . EYE SURGERY Right   . SHOULDER ARTHROSCOPY WITH SUBACROMIAL DECOMPRESSION Right 05/02/2016   Procedure: RIGHT SHOULDER ARTHROSCOPY WITH SUBACROMIAL DECOMPRESSION, MINI OPEN ROTATOR CUFF TEAR REPAIR, AND RIGHT CARPAL TUNNEL RELEASE;  Surgeon: Meredith Pel, MD;  Location: [facility];  Service: Orthopedics;  Laterality: Right;  . TOTAL KNEE ARTHROPLASTY Right 07/15/2012   Procedure: RIGHT TOTAL KNEE ARTHROPLASTY;  Surgeon: Lorn Junes, MD;  Location: [facility];  Service: Orthopedics;  Laterality: Right;    OB History    No data available       Home Medications    Prior to Admission medications   Medication Sig Start Date End Date Taking? Authorizing Provider  acetaminophen-codeine (TYLENOL #3) 300-30 MG tablet 1 po q d prn pain 10/20/16   Meredith Pel, MD  amitriptyline (ELAVIL) 25 MG tablet Take 1 tablet (25 mg total) by mouth at bedtime as needed for sleep. 05/30/16   Maren Reamer, MD  amLODipine (NORVASC) 10 MG tablet Take 1 tablet (10 mg total) by mouth daily. 05/30/16   Maren Reamer, MD  aspirin EC 81 MG tablet Take 1 tablet (81 mg total) by mouth daily. 05/30/16   Langeland, Leda Quail, MD  benzonatate (TESSALON) 100 MG capsule Take 1 capsule (100 mg total) by  mouth every 8 (eight) hours. 12/13/16   Jassmin Kemmerer, Dellis Filbert, PA-C  Fluticasone-Salmeterol (ADVAIR) 100-50 MCG/DOSE AEPB Inhale 1 puff into the lungs 2 (two) times daily. 10/18/16   Ladell Pier, MD  HYDROcodone-acetaminophen (NORCO/VICODIN) 5-325 MG tablet Take 1 tablet by mouth every 6 (six) hours as needed for moderate pain. 09/01/16   Suzan Slick, NP  lisinopril-hydrochlorothiazide (PRINZIDE,ZESTORETIC) 20-25 MG tablet Take 1 tablet by mouth daily. 05/30/16   Maren Reamer, MD  lovastatin (MEVACOR) 40 MG tablet Take 1 tablet (40 mg total) at bedtime by mouth. 11/20/16   Ladell Pier, MD  polyethylene  glycol powder (GLYCOLAX/MIRALAX) powder Take 17 g by mouth daily as needed for moderate constipation. 05/30/16   Langeland, Leda Quail, MD  tiZANidine (ZANAFLEX) 4 MG tablet Take 1 tablet (4 mg total) by mouth every 6 (six) hours as needed for muscle spasms. 05/05/16   Newt Minion, MD  VENTOLIN HFA 108 [phone] Base) MCG/ACT inhaler Inhale 2 puffs every 6 (six) hours as needed into the lungs for wheezing or shortness of breath. 11/20/16   Ladell Pier, MD  Vitamin D, Ergocalciferol, (DRISDOL) 50000 units CAPS capsule Take 1 capsule (50,000 Units total) by mouth every 7 (seven) days. 06/06/16   Maren Reamer, MD    Family History Family History  Problem Relation Age of Onset  . CAD Father 24  . CAD Mother 97  . Hypertension Mother     Social History Social History   Tobacco Use  . Smoking status: Current Every Day Smoker    Packs/day: 0.50  . Smokeless tobacco: Never Used  . Tobacco comment: Smoking 3-4 cigs/day  Substance Use Topics  . Alcohol use: Yes    Alcohol/week: 0.0 oz    Comment: ocassionally  . Drug use: No     Allergies   Penicillins   Review of Systems Review of Systems  All other systems reviewed and are negative.    Physical Exam Updated Vital Signs BP 125/87 (BP Location: Right Arm)   Pulse 73   Temp 98.4 F (36.9 C) (Oral)   Resp 18   SpO2 96%   Physical Exam  Constitutional: She is oriented to person, place, and time. She appears well-developed and well-nourished.  HENT:  Head: Normocephalic and atraumatic.  Right Ear: Tympanic membrane and ear canal normal.  Left Ear: Tympanic membrane and ear canal normal.  Rhinorrhea  Eyes: Conjunctivae are normal. Pupils are equal, round, and reactive to light. Right eye exhibits no discharge. Left eye exhibits no discharge. No scleral icterus.  Neck: Normal range of motion. No JVD present. No tracheal deviation present.  Pulmonary/Chest: Effort normal and breath sounds normal. No stridor. No respiratory  distress. She has no wheezes. She has no rales. She exhibits no tenderness.  Neurological: She is alert and oriented to person, place, and time. Coordination normal.  Psychiatric: She has a normal mood and affect. Her behavior is normal. Judgment and thought content normal.  Nursing note and vitals reviewed.    ED Treatments / Results  Labs (all labs ordered are listed, but only abnormal results are displayed) Labs Reviewed - No data to display  EKG  EKG Interpretation None       Radiology Dg Chest 2 View  Result Date: 12/13/2016 CLINICAL DATA:  Productive cough. EXAM: CHEST  2 VIEW COMPARISON:  12/03/2015 FINDINGS: The cardiac silhouette is mildly enlarged. Mediastinal contours appear intact. Calcific atherosclerotic disease of the aorta. There is no evidence of focal  airspace consolidation, pleural effusion or pneumothorax. Osseous structures are without acute abnormality. Soft tissues are grossly normal. IMPRESSION: No evidence of lobar consolidation. Mildly enlarged cardiac silhouette. Calcific atherosclerotic disease of the aorta. Electronically Signed   By: Fidela Salisbury M.D.   On: 12/13/2016 15:13    Procedures Procedures (including critical care time)  Medications Ordered in ED Medications - No data to display   Initial Impression / Assessment and Plan / ED Course  I have reviewed the triage vital signs and the nursing notes.  Pertinent labs & imaging results that were available during my care of the patient were reviewed by me and considered in my medical decision making (see chart for details).     Final Clinical Impressions(s) / ED Diagnoses   Final diagnoses:  Viral URI with cough    61 year old female presents today with likely viral URI. Patient has a long history of smoking and likely COPD. She does have clear lung sounds here reassuring oxygen saturation in no acute respiratory distress. She is afebrile, no signs of tachycardia, has clear lung  sounds. Her chest x-ray shows no acute infectious findings, no signs of bacterial pneumonia or systemic illness. Patient will be discharged with albuterol, cough medication, outpatient follow-up and strict return precautions. Patient verbalized understanding and agreement to today's plan had no further questions or concerns at time of discharge.  ED Discharge Orders        Ordered    benzonatate (TESSALON) 100 MG capsule  Every 8 hours     12/13/16 1700       Francee Gentile 12/13/16 2119    Milton Ferguson, MD 12/14/16 [phone]

## 2016-12-13 NOTE — ED Notes (Signed)
EDP at bedside  

## 2016-12-13 NOTE — ED Notes (Signed)
Patient given discharge instructions and verbalized understanding.  Patient stable to discharge at this time.  Patient is alert and oriented to baseline.  No distressed noted at this time.  All belongings taken with the patient at discharge.   

## 2016-12-13 NOTE — ED Notes (Signed)
Pt went to X-ray.   

## 2016-12-13 NOTE — ED Triage Notes (Signed)
Patient complains of cough and congestion with back pain since Saturday, chills with same, alert and oriented, NAD

## 2016-12-13 NOTE — Discharge Instructions (Signed)
Please read attached information. If you experience any new or worsening signs or symptoms please return to the emergency room for evaluation. Please follow-up with your primary care provider or specialist as discussed. Please use medication prescribed only as directed and discontinue taking if you have any concerning signs or symptoms.   °

## 2016-12-21 DIAGNOSIS — M25562 Pain in left knee: Secondary | ICD-10-CM | POA: Diagnosis not present

## 2016-12-21 DIAGNOSIS — M25561 Pain in right knee: Secondary | ICD-10-CM | POA: Diagnosis not present

## 2016-12-21 DIAGNOSIS — M25511 Pain in right shoulder: Secondary | ICD-10-CM | POA: Diagnosis not present

## 2016-12-21 DIAGNOSIS — M6281 Muscle weakness (generalized): Secondary | ICD-10-CM | POA: Diagnosis not present

## 2016-12-29 ENCOUNTER — Ambulatory Visit: Payer: Medicare Other | Admitting: Internal Medicine

## 2016-12-29 DIAGNOSIS — M25562 Pain in left knee: Secondary | ICD-10-CM | POA: Diagnosis not present

## 2016-12-29 DIAGNOSIS — M25561 Pain in right knee: Secondary | ICD-10-CM | POA: Diagnosis not present

## 2016-12-29 DIAGNOSIS — M6281 Muscle weakness (generalized): Secondary | ICD-10-CM | POA: Diagnosis not present

## 2016-12-29 DIAGNOSIS — M25511 Pain in right shoulder: Secondary | ICD-10-CM | POA: Diagnosis not present

## 2017-01-01 ENCOUNTER — Ambulatory Visit: Payer: Medicare Other | Admitting: Internal Medicine

## 2017-01-02 MED FILL — CROMOLYN 4% EYE DROPS: 4 | 25 days supply | Qty: 10 | Fill #4

## 2017-01-02 MED FILL — LOVASTATIN 40 MG TABLET: 40 | 30 days supply | Qty: 30 | Fill #1

## 2017-01-03 ENCOUNTER — Ambulatory Visit: Payer: Medicare Other

## 2017-01-04 DIAGNOSIS — M25511 Pain in right shoulder: Secondary | ICD-10-CM | POA: Diagnosis not present

## 2017-01-04 DIAGNOSIS — M25562 Pain in left knee: Secondary | ICD-10-CM | POA: Diagnosis not present

## 2017-01-04 DIAGNOSIS — M25561 Pain in right knee: Secondary | ICD-10-CM | POA: Diagnosis not present

## 2017-01-04 DIAGNOSIS — M6281 Muscle weakness (generalized): Secondary | ICD-10-CM | POA: Diagnosis not present

## 2017-01-19 ENCOUNTER — Encounter: Payer: Self-pay | Admitting: Internal Medicine

## 2017-01-19 ENCOUNTER — Ambulatory Visit: Payer: Medicare Other | Attending: Internal Medicine | Admitting: Internal Medicine

## 2017-01-19 ENCOUNTER — Other Ambulatory Visit: Payer: Self-pay | Admitting: Internal Medicine

## 2017-01-19 VITALS — BP 121/78 | HR 75 | Temp 99.1°F | Resp 16 | Wt 187.6 lb

## 2017-01-19 DIAGNOSIS — I10 Essential (primary) hypertension: Secondary | ICD-10-CM | POA: Diagnosis not present

## 2017-01-19 DIAGNOSIS — Z96651 Presence of right artificial knee joint: Secondary | ICD-10-CM | POA: Insufficient documentation

## 2017-01-19 DIAGNOSIS — F17210 Nicotine dependence, cigarettes, uncomplicated: Secondary | ICD-10-CM | POA: Insufficient documentation

## 2017-01-19 DIAGNOSIS — J449 Chronic obstructive pulmonary disease, unspecified: Secondary | ICD-10-CM | POA: Diagnosis not present

## 2017-01-19 DIAGNOSIS — G5601 Carpal tunnel syndrome, right upper limb: Secondary | ICD-10-CM | POA: Insufficient documentation

## 2017-01-19 DIAGNOSIS — E559 Vitamin D deficiency, unspecified: Secondary | ICD-10-CM | POA: Diagnosis not present

## 2017-01-19 DIAGNOSIS — Z880 Allergy status to penicillin: Secondary | ICD-10-CM | POA: Insufficient documentation

## 2017-01-19 DIAGNOSIS — Z720 Tobacco use: Secondary | ICD-10-CM

## 2017-01-19 DIAGNOSIS — Z1231 Encounter for screening mammogram for malignant neoplasm of breast: Secondary | ICD-10-CM

## 2017-01-19 DIAGNOSIS — Z79899 Other long term (current) drug therapy: Secondary | ICD-10-CM | POA: Insufficient documentation

## 2017-01-19 DIAGNOSIS — Z8249 Family history of ischemic heart disease and other diseases of the circulatory system: Secondary | ICD-10-CM | POA: Insufficient documentation

## 2017-01-19 DIAGNOSIS — F5101 Primary insomnia: Secondary | ICD-10-CM | POA: Diagnosis not present

## 2017-01-19 DIAGNOSIS — Z2882 Immunization not carried out because of caregiver refusal: Secondary | ICD-10-CM | POA: Insufficient documentation

## 2017-01-19 DIAGNOSIS — G4700 Insomnia, unspecified: Secondary | ICD-10-CM | POA: Insufficient documentation

## 2017-01-19 DIAGNOSIS — Z9049 Acquired absence of other specified parts of digestive tract: Secondary | ICD-10-CM | POA: Insufficient documentation

## 2017-01-19 DIAGNOSIS — Z7982 Long term (current) use of aspirin: Secondary | ICD-10-CM | POA: Diagnosis not present

## 2017-01-19 DIAGNOSIS — R05 Cough: Secondary | ICD-10-CM | POA: Diagnosis present

## 2017-01-19 DIAGNOSIS — E785 Hyperlipidemia, unspecified: Secondary | ICD-10-CM | POA: Diagnosis not present

## 2017-01-19 MED ORDER — ALBUTEROL SULFATE (2.5 MG/3ML) 0.083% IN NEBU: 2.5000 mg | INHALATION_SOLUTION | Freq: Four times a day (QID) | RESPIRATORY_TRACT | 1 refills | Status: DC | PRN

## 2017-01-19 MED ORDER — FLUTICASONE-SALMETEROL 250-50 MCG/DOSE IN AEPB: 1.0000 | INHALATION_SPRAY | Freq: Two times a day (BID) | RESPIRATORY_TRACT | 6 refills | Status: DC

## 2017-01-19 MED ORDER — NICOTINE 14 MG/24HR TD PT24: 14.0000 mg | MEDICATED_PATCH | Freq: Every day | TRANSDERMAL | 0 refills | Status: DC

## 2017-01-19 MED ORDER — NICOTINE 21 MG/24HR TD PT24: 21.0000 mg | MEDICATED_PATCH | Freq: Every day | TRANSDERMAL | 0 refills | Status: DC

## 2017-01-19 MED ORDER — TRAZODONE HCL 50 MG PO TABS: 25.0000 mg | ORAL_TABLET | Freq: Every evening | ORAL | 3 refills | Status: DC | PRN

## 2017-01-19 NOTE — Progress Notes (Signed)
Patient ID: ANIA LEVAY, female    DOB: Oct 26, 1955  MRN: [id number]  CC: Hypertension; Insomnia; Wheezing; and Cough   Subjective: Alison Ford is a 62 y.o. female who presents for chronic disease management Her concerns today include:  Pt with hx of  HTN, HL, tob dep, CODP, Vit D def and s/p RT rotator cuff repair and RT CTS release.   1. COPD: Stopped smoking for 2 wks but started back. Would like to try patches. Smoking 1/3 pk a day -Using Advair BID and Ventolin 3 x a day.  Has a dry cough at this time.  No shortness of breath Seen in the ED 11/2016 with upper respiratory infection.  She found the nebulizer treatment helpful and is requesting a prescription for a nebulizer machine to use as needed -does not want Flu shot  2. Insomnia: She cut out drinking coffee at nights. Melatonin has not helped She gets in bed around the same time every night and turns TV off  3.  HTN: Compliant with blood pressure medications and salt restriction.  Denies any chest pains or lower extremity edema.  Patient Active Problem List   Diagnosis Date Noted  . Chronic obstructive pulmonary disease ([place]) 05/30/2016  . Rotator cuff tear 05/02/2016  . Rotator cuff tendonitis, right 12/30/2015  . Cervicalgia 12/30/2015  . Eczema, dyshidrotic 11/18/2014  . Acne 11/18/2014  . Smoker 06/01/2014  . Right knee DJD   . Hyperlipidemia   . HTN (hypertension) 06/05/2012     Current Outpatient Medications on File Prior to Visit  Medication Sig Dispense Refill  . acetaminophen-codeine (TYLENOL #3) 300-30 MG tablet 1 po q d prn pain (Patient not taking: Reported on 01/19/2017) 30 tablet 0  . amitriptyline (ELAVIL) 25 MG tablet Take 1 tablet (25 mg total) by mouth at bedtime as needed for sleep. 30 tablet 1  . amLODipine (NORVASC) 10 MG tablet Take 1 tablet (10 mg total) by mouth daily. 90 tablet 3  . aspirin EC 81 MG tablet Take 1 tablet (81 mg total) by mouth daily. 90 tablet 3  .  HYDROcodone-acetaminophen (NORCO/VICODIN) 5-325 MG tablet Take 1 tablet by mouth every 6 (six) hours as needed for moderate pain. (Patient not taking: Reported on 01/19/2017) 10 tablet 0  . lisinopril-hydrochlorothiazide (PRINZIDE,ZESTORETIC) 20-25 MG tablet Take 1 tablet by mouth daily. 90 tablet 3  . lovastatin (MEVACOR) 40 MG tablet Take 1 tablet (40 mg total) at bedtime by mouth. 30 tablet 3  . tiZANidine (ZANAFLEX) 4 MG tablet Take 1 tablet (4 mg total) by mouth every 6 (six) hours as needed for muscle spasms. (Patient not taking: Reported on 01/19/2017) 30 tablet 0  . VENTOLIN HFA 108 (90 Base) MCG/ACT inhaler Inhale 2 puffs every 6 (six) hours as needed into the lungs for wheezing or shortness of breath. 1 Inhaler 2   No current facility-administered medications on file prior to visit.     Allergies  Allergen Reactions  . Penicillins Itching and Other (See Comments)    Burning and whelps Has patient had a PCN reaction causing immediate rash, facial/tongue/throat swelling, SOB or lightheadedness with hypotension: NO Has patient had a PCN reaction causing severe rash involving mucus membranes or skin necrosis: NO Has patient had a PCN reaction that required hospitalization: NO Has patient had a PCN reaction occurring within the last 10 years: NO If all of the above answers are "NO", then may proceed with Cephalosporin use.    Social History   Socioeconomic  History  . Marital status: Legally Separated    Spouse name: Not on file  . Number of children: 3  . Years of education: Not on file  . Highest education level: Not on file  Social Needs  . Financial resource strain: Not on file  . Food insecurity - worry: Not on file  . Food insecurity - inability: Not on file  . Transportation needs - medical: Not on file  . Transportation needs - non-medical: Not on file  Occupational History    Employer: [id number]  Tobacco Use  . Smoking status: Current Every Day Smoker    Packs/day: 0.50    . Smokeless tobacco: Never Used  . Tobacco comment: Smoking 3-4 cigs/day  Substance and Sexual Activity  . Alcohol use: Yes    Alcohol/week: 0.0 oz    Comment: ocassionally  . Drug use: No  . Sexual activity: No  Other Topics Concern  . Not on file  Social History Narrative   Lives with daughter and two grandsons.     Family History  Problem Relation Age of Onset  . CAD Father 26  . CAD Mother 61  . Hypertension Mother     Past Surgical History:  Procedure Laterality Date  . ABDOMINAL HYSTERECTOMY    . Athroscopic knee surgery     Bilateral  . CARPAL TUNNEL RELEASE Right 05/02/2016   Procedure: CARPAL TUNNEL RELEASE;  Surgeon: Meredith Pel, MD;  Location: [facility];  Service: Orthopedics;  Laterality: Right;  . CHOLECYSTECTOMY    . DILATION AND CURETTAGE OF UTERUS    . EYE SURGERY Right   . SHOULDER ARTHROSCOPY WITH SUBACROMIAL DECOMPRESSION Right 05/02/2016   Procedure: RIGHT SHOULDER ARTHROSCOPY WITH SUBACROMIAL DECOMPRESSION, MINI OPEN ROTATOR CUFF TEAR REPAIR, AND RIGHT CARPAL TUNNEL RELEASE;  Surgeon: Meredith Pel, MD;  Location: [facility];  Service: Orthopedics;  Laterality: Right;  . TOTAL KNEE ARTHROPLASTY Right 07/15/2012   Procedure: RIGHT TOTAL KNEE ARTHROPLASTY;  Surgeon: Lorn Junes, MD;  Location: [facility];  Service: Orthopedics;  Laterality: Right;    ROS: Review of Systems Negative except as stated above PHYSICAL EXAM: BP 121/78   Pulse 75   Temp 99.1 F (37.3 C) (Oral)   Resp 16   Wt 187 lb 9.6 oz (85.1 kg)   SpO2 96%   BMI 28.95 kg/m   Physical Exam General appearance - alert, well appearing, and in no distress Mental status - alert, oriented to person, place, and time, normal mood, behavior, speech, dress, motor activity, and thought processes Mouth - mucous membranes moist, pharynx normal without lesions Chest - clear to auscultation, no wheezes, rales or rhonchi, symmetric air entry Heart - normal rate, regular rhythm, normal S1, S2, no  murmurs, rubs, clicks or gallops Extremities - peripheral pulses normal, no pedal edema, no clubbing or cyanosis  ASSESSMENT AND PLAN: 1. Essential hypertension At goal.  Continue current medications  2. Tobacco use Patient wanting to quit smoking.  She is willing to try the nicotine patches.  I went over the stepdown approach with her. - nicotine (NICODERM CQ - DOSED IN MG/24 HOURS) 21 mg/24hr patch; Place 1 patch (21 mg total) onto the skin daily.  Dispense: 28 patch; Refill: 0 - nicotine (NICODERM CQ - DOSED IN MG/24 HOURS) 14 mg/24hr patch; Place 1 patch (14 mg total) onto the skin daily.  Dispense: 28 patch; Refill: 0 - albuterol (PROVENTIL) (2.5 MG/3ML) 0.083% nebulizer solution; Take 3 mLs (2.5 mg total) by nebulization every 6 (  six) hours as needed for wheezing or shortness of breath.  Dispense: 150 mL; Refill: 1 - Fluticasone-Salmeterol (ADVAIR DISKUS) 250-50 MCG/DOSE AEPB; Inhale 1 puff into the lungs 2 (two) times daily.  Dispense: 60 each; Refill: 6  3. Primary insomnia Continue good sleeping hygiene.  We will try her with trazodone - traZODone (DESYREL) 50 MG tablet; Take 0.5-1 tablets (25-50 mg total) by mouth at bedtime as needed for sleep.  Dispense: 30 tablet; Refill: 3  4. Chronic obstructive pulmonary disease, unspecified COPD type ([place] ) -We will increase Advair to 250/50.  Prescription given for nebulizer machine and albuterol solution Smoking cessation encouraged She declines flu shot - DME Nebulizer machine - Patient was given the opportunity to ask questions.  Patient verbalized understanding of the plan and was able to repeat key elements of the plan.   Orders Placed This Encounter  Procedures  . DME Nebulizer machine     Requested Prescriptions   Signed Prescriptions Disp Refills  . nicotine (NICODERM CQ - DOSED IN MG/24 HOURS) 21 mg/24hr patch 28 patch 0    Sig: Place 1 patch (21 mg total) onto the skin daily.  . nicotine (NICODERM CQ - DOSED IN MG/24  HOURS) 14 mg/24hr patch 28 patch 0    Sig: Place 1 patch (14 mg total) onto the skin daily.  Marland Kitchen albuterol (PROVENTIL) (2.5 MG/3ML) 0.083% nebulizer solution 150 mL 1    Sig: Take 3 mLs (2.5 mg total) by nebulization every 6 (six) hours as needed for wheezing or shortness of breath.  . Fluticasone-Salmeterol (ADVAIR DISKUS) 250-50 MCG/DOSE AEPB 60 each 6    Sig: Inhale 1 puff into the lungs 2 (two) times daily.  . traZODone (DESYREL) 50 MG tablet 30 tablet 3    Sig: Take 0.5-1 tablets (25-50 mg total) by mouth at bedtime as needed for sleep.    Return in about 3 months (around 04/19/2017).  Karle Plumber, MD, FACP

## 2017-01-19 NOTE — Progress Notes (Signed)
Pt states she is needing her amitriptyline refilled Pt states she purchased the Melatonin otc and it has not helped her

## 2017-01-19 NOTE — Patient Instructions (Signed)
Increase Advair to 250/50.  Use nicotine patches as discussed.  Start with the 21 mg nicotine patches.  Use the Trazodone as needed for sleep.

## 2017-01-22 ENCOUNTER — Telehealth: Payer: Self-pay | Admitting: Internal Medicine

## 2017-01-22 ENCOUNTER — Ambulatory Visit: Payer: Medicare Other

## 2017-01-22 NOTE — Telephone Encounter (Signed)
Patient called about vitamin D. Please fu she doesn't know what to take

## 2017-01-24 NOTE — Telephone Encounter (Signed)
Tried contacting pt but she didn't answer lvm asking pt to give me a call at her earliest convienence   Dr. Wynetta Emery is pt suppose to have a rx for vit D or is she suppose to be taking vit D otc?

## 2017-01-25 DIAGNOSIS — M25562 Pain in left knee: Secondary | ICD-10-CM | POA: Diagnosis not present

## 2017-01-25 DIAGNOSIS — M25561 Pain in right knee: Secondary | ICD-10-CM | POA: Diagnosis not present

## 2017-01-25 DIAGNOSIS — M6281 Muscle weakness (generalized): Secondary | ICD-10-CM | POA: Diagnosis not present

## 2017-01-25 DIAGNOSIS — M25511 Pain in right shoulder: Secondary | ICD-10-CM | POA: Diagnosis not present

## 2017-01-26 NOTE — Telephone Encounter (Signed)
Pt states she purchased 1.25 mcg 5,000 iu vit d3. Pt wants to know if she can take this instead and if so how often.

## 2017-01-29 NOTE — Telephone Encounter (Signed)
PC placed to pt to clarify what she is taking in regards to the Vit D.  Pt has the 5000 IU and Ca/Vit D 600/800 IU.  I told her to take the 5000 IU once a wk and stop the Ca/Vit D combo.  She expressed understanding.

## 2017-01-29 NOTE — Telephone Encounter (Signed)
Contacted pt and made aware that per Dr. Wynetta Emery to take vit D3 once a week. Pt states when she purschased the Vit D3 she purchased the Calcium 600mg  + vd 800iu. Pt wants to know is this okay?

## 2017-02-01 DIAGNOSIS — M25562 Pain in left knee: Secondary | ICD-10-CM | POA: Diagnosis not present

## 2017-02-01 DIAGNOSIS — M25511 Pain in right shoulder: Secondary | ICD-10-CM | POA: Diagnosis not present

## 2017-02-01 DIAGNOSIS — M6281 Muscle weakness (generalized): Secondary | ICD-10-CM | POA: Diagnosis not present

## 2017-02-01 DIAGNOSIS — M25561 Pain in right knee: Secondary | ICD-10-CM | POA: Diagnosis not present

## 2017-02-08 ENCOUNTER — Ambulatory Visit: Payer: Medicare Other

## 2017-02-20 MED FILL — CROMOLYN 4% EYE DROPS: 4 | 18 days supply | Qty: 10 | Fill #0

## 2017-02-20 MED FILL — LOVASTATIN 40 MG TABLET: 40 | 30 days supply | Qty: 30 | Fill #2

## 2017-02-20 MED FILL — !ADVAIR 100/50 DISKUS: 100-50 | 30 days supply | Qty: 60 | Fill #1

## 2017-02-27 ENCOUNTER — Ambulatory Visit
Admission: RE | Admit: 2017-02-27 | Discharge: 2017-02-27 | Disposition: A | Discharge status: Home / Self Care | Payer: Medicare Other | Source: Ambulatory Visit | Attending: Internal Medicine | Admitting: Internal Medicine

## 2017-02-27 ENCOUNTER — Other Ambulatory Visit: Payer: Self-pay | Admitting: Internal Medicine

## 2017-02-27 DIAGNOSIS — Z1231 Encounter for screening mammogram for malignant neoplasm of breast: Secondary | ICD-10-CM | POA: Diagnosis not present

## 2017-02-28 ENCOUNTER — Other Ambulatory Visit: Payer: Self-pay | Admitting: Internal Medicine

## 2017-02-28 DIAGNOSIS — R928 Other abnormal and inconclusive findings on diagnostic imaging of breast: Secondary | ICD-10-CM

## 2017-03-06 ENCOUNTER — Other Ambulatory Visit: Payer: Medicare Other

## 2017-03-09 ENCOUNTER — Ambulatory Visit
Admission: RE | Admit: 2017-03-09 | Discharge: 2017-03-09 | Disposition: A | Discharge status: Home / Self Care | Payer: Medicare Other | Source: Ambulatory Visit | Attending: Internal Medicine | Admitting: Internal Medicine

## 2017-03-09 ENCOUNTER — Other Ambulatory Visit: Payer: Self-pay | Admitting: Internal Medicine

## 2017-03-09 DIAGNOSIS — R928 Other abnormal and inconclusive findings on diagnostic imaging of breast: Secondary | ICD-10-CM | POA: Diagnosis not present

## 2017-03-09 DIAGNOSIS — N6310 Unspecified lump in the right breast, unspecified quadrant: Secondary | ICD-10-CM

## 2017-03-16 ENCOUNTER — Other Ambulatory Visit: Payer: Medicare Other

## 2017-03-16 HISTORY — PX: BREAST BIOPSY: SHX20

## 2017-03-19 ENCOUNTER — Other Ambulatory Visit: Payer: Self-pay | Admitting: Internal Medicine

## 2017-03-19 DIAGNOSIS — N6310 Unspecified lump in the right breast, unspecified quadrant: Secondary | ICD-10-CM

## 2017-03-20 ENCOUNTER — Ambulatory Visit
Admission: RE | Admit: 2017-03-20 | Discharge: 2017-03-20 | Disposition: A | Discharge status: Home / Self Care | Payer: Medicare Other | Source: Ambulatory Visit | Attending: Internal Medicine | Admitting: Internal Medicine

## 2017-03-20 DIAGNOSIS — N6311 Unspecified lump in the right breast, upper outer quadrant: Secondary | ICD-10-CM | POA: Diagnosis not present

## 2017-03-20 DIAGNOSIS — N6310 Unspecified lump in the right breast, unspecified quadrant: Secondary | ICD-10-CM

## 2017-03-20 DIAGNOSIS — N6011 Diffuse cystic mastopathy of right breast: Secondary | ICD-10-CM | POA: Diagnosis not present

## 2017-03-20 MED FILL — CROMOLYN 4% EYE DROPS: 4 | 18 days supply | Qty: 10 | Fill #1

## 2017-03-20 MED FILL — ADVAIR 100/50 DISKUS: 100-50 | 30 days supply | Qty: 60 | Fill #2

## 2017-03-20 MED FILL — LOVASTATIN 40 MG TABLET: 40 | 30 days supply | Qty: 30 | Fill #3

## 2017-04-04 ENCOUNTER — Other Ambulatory Visit: Payer: Self-pay | Admitting: Internal Medicine

## 2017-04-04 ENCOUNTER — Telehealth: Payer: Self-pay | Admitting: Internal Medicine

## 2017-04-04 MED ORDER — BUDESONIDE-FORMOTEROL FUMARATE 80-4.5 MCG/ACT IN AERO: 2.0000 | INHALATION_SPRAY | Freq: Two times a day (BID) | RESPIRATORY_TRACT | 3 refills | Status: DC

## 2017-04-04 MED FILL — SYMBICORT 80-4.5 MCG INH: 80-4.5 | 30 days supply | Qty: 10 | Fill #0

## 2017-04-04 NOTE — Telephone Encounter (Signed)
Contacted pt to inform her of Dr. Wynetta Emery concern and pt is aware and will be by to pick up new inhaler

## 2017-04-12 ENCOUNTER — Other Ambulatory Visit: Payer: Self-pay | Admitting: Internal Medicine

## 2017-04-13 ENCOUNTER — Other Ambulatory Visit: Payer: Self-pay

## 2017-04-13 DIAGNOSIS — I10 Essential (primary) hypertension: Secondary | ICD-10-CM

## 2017-04-13 MED ORDER — AMLODIPINE BESYLATE 10 MG PO TABS: 10.0000 mg | ORAL_TABLET | Freq: Every day | ORAL | 3 refills | Status: DC

## 2017-04-17 ENCOUNTER — Other Ambulatory Visit: Payer: Self-pay | Admitting: Internal Medicine

## 2017-04-17 MED FILL — VENTOLIN HFA 90 MCG INHALER: 108 (90 BAS | 25 days supply | Qty: 18 | Fill #0

## 2017-04-19 ENCOUNTER — Encounter: Payer: Self-pay | Admitting: Internal Medicine

## 2017-04-19 ENCOUNTER — Ambulatory Visit: Payer: Medicare Other | Attending: Internal Medicine | Admitting: Internal Medicine

## 2017-04-19 VITALS — BP 113/72 | HR 71 | Temp 99.1°F | Resp 16 | Wt 192.0 lb

## 2017-04-19 DIAGNOSIS — Z90710 Acquired absence of both cervix and uterus: Secondary | ICD-10-CM | POA: Insufficient documentation

## 2017-04-19 DIAGNOSIS — Z880 Allergy status to penicillin: Secondary | ICD-10-CM | POA: Diagnosis not present

## 2017-04-19 DIAGNOSIS — Z79899 Other long term (current) drug therapy: Secondary | ICD-10-CM | POA: Diagnosis not present

## 2017-04-19 DIAGNOSIS — Z7982 Long term (current) use of aspirin: Secondary | ICD-10-CM | POA: Diagnosis not present

## 2017-04-19 DIAGNOSIS — Z720 Tobacco use: Secondary | ICD-10-CM | POA: Diagnosis not present

## 2017-04-19 DIAGNOSIS — G5601 Carpal tunnel syndrome, right upper limb: Secondary | ICD-10-CM | POA: Insufficient documentation

## 2017-04-19 DIAGNOSIS — Z96651 Presence of right artificial knee joint: Secondary | ICD-10-CM | POA: Diagnosis not present

## 2017-04-19 DIAGNOSIS — E559 Vitamin D deficiency, unspecified: Secondary | ICD-10-CM | POA: Insufficient documentation

## 2017-04-19 DIAGNOSIS — F17210 Nicotine dependence, cigarettes, uncomplicated: Secondary | ICD-10-CM | POA: Diagnosis not present

## 2017-04-19 DIAGNOSIS — J449 Chronic obstructive pulmonary disease, unspecified: Secondary | ICD-10-CM | POA: Insufficient documentation

## 2017-04-19 DIAGNOSIS — K5900 Constipation, unspecified: Secondary | ICD-10-CM | POA: Diagnosis not present

## 2017-04-19 DIAGNOSIS — Z9049 Acquired absence of other specified parts of digestive tract: Secondary | ICD-10-CM | POA: Diagnosis not present

## 2017-04-19 DIAGNOSIS — E785 Hyperlipidemia, unspecified: Secondary | ICD-10-CM | POA: Insufficient documentation

## 2017-04-19 DIAGNOSIS — Z8249 Family history of ischemic heart disease and other diseases of the circulatory system: Secondary | ICD-10-CM | POA: Insufficient documentation

## 2017-04-19 DIAGNOSIS — Z9889 Other specified postprocedural states: Secondary | ICD-10-CM | POA: Insufficient documentation

## 2017-04-19 DIAGNOSIS — I10 Essential (primary) hypertension: Secondary | ICD-10-CM | POA: Insufficient documentation

## 2017-04-19 DIAGNOSIS — E663 Overweight: Secondary | ICD-10-CM

## 2017-04-19 NOTE — Progress Notes (Signed)
Patient ID: Alison Ford, female    DOB: [birth year]  MRN: [id number]  CC: Hypertension   Subjective: Alison Ford is a 62 y.o. female who presents for chronic ds management. Her concerns today include:  Pt with hx of HTN, HL, tob dep, CODP, Vit D def and s/p RT rotator cuff repair and RT CTS release.   1.  COPD:  Compliant with Symbicort. Using Albuterol 2-3 x a day before walking; she finds that she is not as SOB and wheezy when she does this -she was not able to get the nicotine patches; could not afford.    2.  HTN:   Compliant with meds and salt restriction No CP.  SOB associated with COPD  3.  HL:  Taking Lovastatin as prescribed  4.  Vit D def:  Taking Vit D OTC but does not recall the dose. Also takes Ca OTC   5.  Constipation:  Pours some Miralax into her coffee each morning but this causes some bloating and gurgling of the stomach.   Patient Active Problem List   Diagnosis Date Noted  . Chronic obstructive pulmonary disease ([place]) 05/30/2016  . Rotator cuff tear 05/02/2016  . Rotator cuff tendonitis, right 12/30/2015  . Cervicalgia 12/30/2015  . Eczema, dyshidrotic 11/18/2014  . Acne 11/18/2014  . Smoker 06/01/2014  . Right knee DJD   . Hyperlipidemia   . HTN (hypertension) 06/05/2012     Current Outpatient Medications on File Prior to Visit  Medication Sig Dispense Refill  . albuterol (PROVENTIL) (2.5 MG/3ML) 0.083% nebulizer solution Take 3 mLs (2.5 mg total) by nebulization every 6 (six) hours as needed for wheezing or shortness of breath. 150 mL 1  . amitriptyline (ELAVIL) 25 MG tablet Take 1 tablet (25 mg total) by mouth at bedtime as needed for sleep. 30 tablet 1  . amLODipine (NORVASC) 10 MG tablet Take 1 tablet (10 mg total) by mouth daily. 30 tablet 3  . aspirin EC 81 MG tablet Take 1 tablet (81 mg total) by mouth daily. 90 tablet 3  . budesonide-formoterol (SYMBICORT) 80-4.5 MCG/ACT inhaler Inhale 2 puffs into the lungs 2 (two) times daily. 1  Inhaler 3  . lisinopril-hydrochlorothiazide (PRINZIDE,ZESTORETIC) 20-25 MG tablet Take 1 tablet by mouth daily. 90 tablet 3  . lovastatin (MEVACOR) 40 MG tablet TAKE 1 TABLET AT BEDTIME BY MOUTH. 30 tablet 2  . nicotine (NICODERM CQ - DOSED IN MG/24 HOURS) 21 mg/24hr patch Place 1 patch (21 mg total) onto the skin daily. 28 patch 0  . tiZANidine (ZANAFLEX) 4 MG tablet Take 1 tablet (4 mg total) by mouth every 6 (six) hours as needed for muscle spasms. (Patient not taking: Reported on 01/19/2017) 30 tablet 0  . traZODone (DESYREL) 50 MG tablet Take 0.5-1 tablets (25-50 mg total) by mouth at bedtime as needed for sleep. 30 tablet 3  . VENTOLIN HFA 108 (90 Base) MCG/ACT inhaler INHALE 2 PUFFS EVERY 6 HOURS AS NEEDED INTO THE LUNGS FOR WHEEZING OR SHORTNESS OF BREATH. 18 g 2   No current facility-administered medications on file prior to visit.     Allergies  Allergen Reactions  . Penicillins Itching and Other (See Comments)    Burning and whelps Has patient had a PCN reaction causing immediate rash, facial/tongue/throat swelling, SOB or lightheadedness with hypotension: NO Has patient had a PCN reaction causing severe rash involving mucus membranes or skin necrosis: NO Has patient had a PCN reaction that required hospitalization: NO Has patient had  a PCN reaction occurring within the last 10 years: NO If all of the above answers are "NO", then may proceed with Cephalosporin use.    Social History   Socioeconomic History  . Marital status: Legally Separated    Spouse name: Not on file  . Number of children: 3  . Years of education: Not on file  . Highest education level: Not on file  Occupational History    Employer: [employer]  . Financial resource strain: Not on file  . Food insecurity:    Worry: Not on file    Inability: Not on file  . Transportation needs:    Medical: Not on file    Non-medical: Not on file  Tobacco Use  . Smoking status: Current Every Day Smoker     Packs/day: 0.50  . Smokeless tobacco: Never Used  . Tobacco comment: Smoking 3-4 cigs/day  Substance and Sexual Activity  . Alcohol use: Yes    Alcohol/week: 0.0 oz    Comment: ocassionally  . Drug use: No  . Sexual activity: Never  Lifestyle  . Physical activity:    Days per week: Not on file    Minutes per session: Not on file  . Stress: Not on file  Relationships  . Social connections:    Talks on phone: Not on file    Gets together: Not on file    Attends religious service: Not on file    Active member of club or organization: Not on file    Attends meetings of clubs or organizations: Not on file    Relationship status: Not on file  . Intimate partner violence:    Fear of current or ex partner: Not on file    Emotionally abused: Not on file    Physically abused: Not on file    Forced sexual activity: Not on file  Other Topics Concern  . Not on file  Social History Narrative   Lives with daughter and two grandsons.     Family History  Problem Relation Age of Onset  . CAD Father 82  . CAD Mother 36  . Hypertension Mother     Past Surgical History:  Procedure Laterality Date  . ABDOMINAL HYSTERECTOMY    . Athroscopic knee surgery     Bilateral  . CARPAL TUNNEL RELEASE Right 05/02/2016   Procedure: CARPAL TUNNEL RELEASE;  Surgeon: Meredith Pel, MD;  Location: [facility];  Service: Orthopedics;  Laterality: Right;  . CHOLECYSTECTOMY    . DILATION AND CURETTAGE OF UTERUS    . EYE SURGERY Right   . SHOULDER ARTHROSCOPY WITH SUBACROMIAL DECOMPRESSION Right 05/02/2016   Procedure: RIGHT SHOULDER ARTHROSCOPY WITH SUBACROMIAL DECOMPRESSION, MINI OPEN ROTATOR CUFF TEAR REPAIR, AND RIGHT CARPAL TUNNEL RELEASE;  Surgeon: Meredith Pel, MD;  Location: [facility];  Service: Orthopedics;  Laterality: Right;  . TOTAL KNEE ARTHROPLASTY Right 07/15/2012   Procedure: RIGHT TOTAL KNEE ARTHROPLASTY;  Surgeon: Lorn Junes, MD;  Location: [facility];  Service: Orthopedics;  Laterality:  Right;    ROS: Review of Systems Gen: Would like to lose some weight especially around the abdomen.  She admits that she eats a lot of white carbohydrates. PHYSICAL EXAM: BP 113/72   Pulse 71   Temp 99.1 F (37.3 C)   Resp 16   Wt 192 lb (87.1 kg)   SpO2 95%   BMI 29.63 kg/m   Wt Readings from Last 3 Encounters:  04/19/17 192 lb (87.1 kg)  01/19/17  187 lb 9.6 oz (85.1 kg)  08/29/16 190 lb (86.2 kg)    Physical Exam  General appearance - alert, well appearing, and in no distress Mental status - alert, oriented to person, place, and time, normal mood, behavior, speech, dress, motor activity, and thought processes Neck - supple, no significant adenopathy Chest - clear to auscultation, no wheezes, rales or rhonchi, symmetric air entry Heart - normal rate, regular rhythm, normal S1, S2, no murmurs, rubs, clicks or gallops Extremities - peripheral pulses normal, no pedal edema, no clubbing or cyanosis   ASSESSMENT AND PLAN: 1. Essential hypertension At goal.  Continue lisinopril/HCTZ and amlodipine - CBC - Comprehensive metabolic panel - Lipid panel  2. Tobacco use Advised patient to call [phone] quit now to request the nicotine patches for free  3. Chronic obstructive pulmonary disease, unspecified COPD type (HCC) Continue Symbicort and albuterol.  Okay to use albuterol prior to increase physical activity  4. Vitamin D deficiency  - VITAMIN D 25 Hydroxy (Vit-D Deficiency, Fractures)  5. Constipation, unspecified constipation type We discussed natural ways to keep bowel movements soft and regular.  Advised patient to drink several glasses of water daily, eat a green leafy vegetable with at least 1 of her meals every day.  She can drink half a cup of prune juice 2-3 times a week.  Will stop the MiraLAX  6. Over weight Counseled on healthy eating habits.  Encourage her to cut back on white carbohydrates.  Avoid sugary drinks.  Patient was given the opportunity to ask  questions.  Patient verbalized understanding of the plan and was able to repeat key elements of the plan.   Orders Placed This Encounter  Procedures  . CBC  . Comprehensive metabolic panel  . VITAMIN D 25 Hydroxy (Vit-D Deficiency, Fractures)  . Lipid panel     Requested Prescriptions    No prescriptions requested or ordered in this encounter    Return in about 3 months (around 07/19/2017).  Karle Plumber, MD, FACP

## 2017-04-19 NOTE — Patient Instructions (Signed)
Call [phone] quit now to request the free nicotine patches.  Try drinking prune juice 2-3 times a week to help decrease constipation.  Follow a Healthy Eating Plan - You can do it! Limit sugary drinks.  Avoid sodas, sweet tea, sport or energy drinks, or fruit drinks.  Drink water, lo-fat milk, or diet drinks. Limit snack foods.   Cut back on candy, cake, cookies, chips, ice cream.  These are a special treat, only in small amounts. Eat plenty of vegetables.  Especially dark green, red, and orange vegetables. Aim for at least 3 servings a day. More is better! Include fruit in your daily diet.  Whole fruit is much healthier than fruit juice! Limit "white" bread, "white" pasta, "white" rice.   Choose "100% whole grain" products, brown or wild rice. Avoid fatty meats. Try "Meatless Monday" and choose eggs or beans one day a week.  When eating meat, choose lean meats like chicken, [place], and fish.  Grill, broil, or bake meats instead of frying, and eat poultry without the skin. Eat less salt.  Avoid frozen pizzas, frozen dinners and salty foods.  Use seasonings other than salt in cooking.  This can help blood pressure and keep you from swelling Beer, wine and liquor have calories.  If you can safely drink alcohol, limit to 1 drink per day for women, 2 drinks for men

## 2017-04-20 ENCOUNTER — Telehealth: Payer: Self-pay

## 2017-04-20 LAB — COMPREHENSIVE METABOLIC PANEL
ALBUMIN: 4.3 g/dL (ref 3.6–4.8)
ALK PHOS: 71 IU/L (ref 39–117)
ALT: 14 IU/L (ref 0–32)
AST: 18 IU/L (ref 0–40)
Albumin/Globulin Ratio: 1.9 (ref 1.2–2.2)
BUN / CREAT RATIO: 18 (ref 12–28)
BUN: 14 mg/dL (ref 8–27)
Bilirubin Total: <0.2 mg/dL (ref 0.0–1.2)
CO2: 26 mmol/L (ref 20–29)
CREATININE: 0.79 mg/dL (ref 0.57–1.00)
Calcium: 10.4 mg/dL — ABNORMAL HIGH (ref 8.7–10.3)
Chloride: 102 mmol/L (ref 96–106)
GFR calc Af Amer: 93 mL/min/{1.73_m2} (ref >59)
GFR calc non Af Amer: 80 mL/min/{1.73_m2} (ref >59)
GLUCOSE: 120 mg/dL — AB (ref 65–99)
Globulin, Total: 2.3 g/dL (ref 1.5–4.5)
Potassium: 4.3 mmol/L (ref 3.5–5.2)
Sodium: 143 mmol/L (ref 134–144)
Total Protein: 6.6 g/dL (ref 6.0–8.5)

## 2017-04-20 LAB — CBC
HEMOGLOBIN: 12.3 g/dL (ref 11.1–15.9)
Hematocrit: 36.7 % (ref 34.0–46.6)
MCH: 30.8 pg (ref 26.6–33.0)
MCHC: 33.5 g/dL (ref 31.5–35.7)
MCV: 92 fL (ref 79–97)
Platelets: 277 10*3/uL (ref 150–379)
RBC: 3.99 x10E6/uL (ref 3.77–5.28)
RDW: 13.5 % (ref 12.3–15.4)
WBC: 7.1 10*3/uL (ref 3.4–10.8)

## 2017-04-20 LAB — LIPID PANEL
Chol/HDL Ratio: 4 ratio (ref 0.0–4.4)
Cholesterol, Total: 195 mg/dL (ref 100–199)
HDL: 49 mg/dL (ref >39)
LDL Calculated: 109 mg/dL — ABNORMAL HIGH (ref 0–99)
TRIGLYCERIDES: 184 mg/dL — AB (ref 0–149)
VLDL Cholesterol Cal: 37 mg/dL (ref 5–40)

## 2017-04-20 LAB — VITAMIN D 25 HYDROXY (VIT D DEFICIENCY, FRACTURES): VIT D 25 HYDROXY: 44.8 ng/mL (ref 30.0–100.0)

## 2017-04-20 NOTE — Telephone Encounter (Signed)
Contacted pt to go over lab results pt is aware and doesn't have any questions or concerns 

## 2017-05-02 ENCOUNTER — Ambulatory Visit (INDEPENDENT_AMBULATORY_CARE_PROVIDER_SITE_OTHER): Payer: Medicare Other | Admitting: Orthopedic Surgery

## 2017-05-07 ENCOUNTER — Encounter (INDEPENDENT_AMBULATORY_CARE_PROVIDER_SITE_OTHER): Payer: Self-pay | Admitting: Orthopedic Surgery

## 2017-05-07 ENCOUNTER — Ambulatory Visit (INDEPENDENT_AMBULATORY_CARE_PROVIDER_SITE_OTHER): Payer: Medicare Other | Admitting: Orthopedic Surgery

## 2017-05-07 DIAGNOSIS — M79604 Pain in right leg: Secondary | ICD-10-CM

## 2017-05-07 DIAGNOSIS — Z96651 Presence of right artificial knee joint: Secondary | ICD-10-CM | POA: Diagnosis not present

## 2017-05-07 DIAGNOSIS — T84032A Mechanical loosening of internal right knee prosthetic joint, initial encounter: Secondary | ICD-10-CM

## 2017-05-07 DIAGNOSIS — M79605 Pain in left leg: Secondary | ICD-10-CM | POA: Diagnosis not present

## 2017-05-07 DIAGNOSIS — M25562 Pain in left knee: Secondary | ICD-10-CM | POA: Diagnosis not present

## 2017-05-07 DIAGNOSIS — M25561 Pain in right knee: Secondary | ICD-10-CM | POA: Diagnosis not present

## 2017-05-07 DIAGNOSIS — M25461 Effusion, right knee: Secondary | ICD-10-CM | POA: Diagnosis not present

## 2017-05-08 ENCOUNTER — Encounter (INDEPENDENT_AMBULATORY_CARE_PROVIDER_SITE_OTHER): Payer: Self-pay | Admitting: Orthopedic Surgery

## 2017-05-08 LAB — CBC WITH DIFFERENTIAL/PLATELET
Basophils Absolute: 42 cells/uL (ref 0–200)
Basophils Relative: 0.6 %
Eosinophils Absolute: 273 cells/uL (ref 15–500)
Eosinophils Relative: 3.9 %
HCT: 35.8 % (ref 35.0–45.0)
Hemoglobin: 12.3 g/dL (ref 11.7–15.5)
Lymphs Abs: 3171 cells/uL (ref 850–3900)
MCH: 30.5 pg (ref 27.0–33.0)
MCHC: 34.4 g/dL (ref 32.0–36.0)
MCV: 88.8 fL (ref 80.0–100.0)
MONOS PCT: 8.5 %
MPV: 10 fL (ref 7.5–12.5)
NEUTROS PCT: 41.7 %
Neutro Abs: 2919 cells/uL (ref 1500–7800)
PLATELETS: 295 10*3/uL (ref 140–400)
RBC: 4.03 10*6/uL (ref 3.80–5.10)
RDW: 12.6 % (ref 11.0–15.0)
TOTAL LYMPHOCYTE: 45.3 %
WBC: 7 10*3/uL (ref 3.8–10.8)
WBCMIX: 595 {cells}/uL (ref 200–950)

## 2017-05-08 LAB — HIGH SENSITIVITY CRP: HS-CRP: 2.7 mg/L

## 2017-05-08 LAB — SEDIMENTATION RATE: Sed Rate: 17 mm/h (ref 0–30)

## 2017-05-08 NOTE — Progress Notes (Signed)
Office Visit Note   Patient: Alison Ford           Date of Birth: 07-31-1955           MRN: [id number] Visit Date: 05/07/2017 Requested by: Alison Pier, MD Alison Ford, [place] [address] PCP: Alison Pier, MD  Subjective: Chief Complaint  Patient presents with  . Right Shoulder - Follow-up  . Right Knee - Pain  . Left Knee - Pain    HPI: Alison Ford is a patient with bilateral knee pain.  She had right total knee replacement done 2014 by another provider.  Subsequent bone scan more than 2 years after the knee replacement shows signal concerning for loosening.  This also appears to be the case on plain radiographs.  The right one hurts more than the left knee.  The left knee has arthritis.  She feels like physical therapy has helped her.  She would like to continue physical therapy.  She is using a cane.  She is helping her brother who is sick.  She denies any fevers or chills.  She states her right shoulder is doing well from that surgery.              ROS: All systems reviewed are negative as they relate to the chief complaint within the history of present illness.  Patient denies  fevers or chills.   Assessment & Plan: Visit Diagnoses:  1. Pain in left leg   2. Pain in right leg   3. History of total right knee replacement   4. 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  There is no progressive deformity at this time but there is pain and evidence of loosening.  Laboratory values ordered at this clinic visit but available at the time of the dictation are negative for infection.  I do want her to continue with some therapy for both legs just to help get strength.  She is likely going to need replacement revision in the future.  I will see her back as needed.  She is doing well with her right  shoulder.  Follow-Up Instructions: Return if symptoms worsen or fail to improve.   Orders:  Orders Placed This Encounter  Procedures  . CBC with Differential  . Sed Rate (ESR)  . CRP High sensitivity  . Ambulatory referral to Physical Therapy   No orders of the defined types were placed in this encounter.     Procedures: No procedures performed   Clinical Data: No additional findings.  Objective: Vital Signs: There were no vitals taken for this visit.  Physical Exam:   Constitutional: Patient appears well-developed HEENT:  Head: Normocephalic Eyes:EOM are normal Neck: Normal range of motion Cardiovascular: Normal rate Pulmonary/chest: Effort normal Neurologic: Patient is alert Skin: Skin is warm Psychiatric: Patient has normal mood and affect    Ortho Exam: Orthopedic exam demonstrates full active and passive range of motion of both arms.  No real restriction of movement in the shoulder.  Strength is good with no coarse grinding or crepitus on the right-hand side.  On the right knee there is no effusion but there is painful range of motion.  Collateral and cruciate ligaments stable on the left.  Collateral stable on the right.  Extensor mechanism intact and the range of motion is very good.  No warmth on the right knee compared to the left knee.  No groin pain with internal/external rotation of the leg.  Specialty Comments:  No specialty comments available.  Imaging: No results found.   PMFS History: Patient Active Problem List   Diagnosis Date Noted  . Chronic obstructive pulmonary disease ([facility] [facility]) 05/30/2016  . Rotator cuff tear 05/02/2016  . Rotator cuff tendonitis, right 12/30/2015  . Cervicalgia 12/30/2015  . Eczema, dyshidrotic 11/18/2014  . Acne 11/18/2014  . Smoker 06/01/2014  . Right knee DJD   . Hyperlipidemia   . HTN (hypertension) 06/05/2012   Past Medical History:  Diagnosis Date  . Abnormal facial hair   . Back pain    d/t knee pain  .  Headache(784.0)   . Heart murmur    slight  . HTN (hypertension)    takes Amlodipine daily and HCTZ   . Hyperlipidemia    takes Lovastatin nightly  . Joint pain   . Right knee DJD   . Weakness    left hand    Family History  Problem Relation Age of Onset  . CAD Father 89  . CAD Mother 33  . Hypertension Mother     Past Surgical History:  Procedure Laterality Date  . ABDOMINAL HYSTERECTOMY    . Athroscopic knee surgery     Bilateral  . CARPAL TUNNEL RELEASE Right 05/02/2016   Procedure: CARPAL TUNNEL RELEASE;  Surgeon: Meredith Pel, MD;  Location: [facility];  Service: Orthopedics;  Laterality: Right;  . CHOLECYSTECTOMY    . DILATION AND CURETTAGE OF UTERUS    . EYE SURGERY Right   . SHOULDER ARTHROSCOPY WITH SUBACROMIAL DECOMPRESSION Right 05/02/2016   Procedure: RIGHT SHOULDER ARTHROSCOPY WITH SUBACROMIAL DECOMPRESSION, MINI OPEN ROTATOR CUFF TEAR REPAIR, AND RIGHT CARPAL TUNNEL RELEASE;  Surgeon: Meredith Pel, MD;  Location: [place];  Service: Orthopedics;  Laterality: Right;  . TOTAL KNEE ARTHROPLASTY Right 07/15/2012   Procedure: RIGHT TOTAL KNEE ARTHROPLASTY;  Surgeon: Lorn Junes, MD;  Location: [facility];  Service: Orthopedics;  Laterality: Right;   Social History   Occupational History    Employer: [id number]  Tobacco Use  . Smoking status: Current Every Day Smoker    Packs/day: 0.50  . Smokeless tobacco: Never Used  . Tobacco comment: Smoking 3-4 cigs/day  Substance and Sexual Activity  . Alcohol use: Yes    Alcohol/week: 0.0 oz    Comment: ocassionally  . Drug use: No  . Sexual activity: Never

## 2017-05-08 NOTE — Progress Notes (Signed)
No laboratory evidence of infection.  Please call thanks

## 2017-05-15 ENCOUNTER — Telehealth (INDEPENDENT_AMBULATORY_CARE_PROVIDER_SITE_OTHER): Payer: Self-pay | Admitting: Orthopedic Surgery

## 2017-05-15 NOTE — Telephone Encounter (Signed)
Patient called to see if orders could be added to have physical therapy on her right shoulder as well as her knee. She said she talked to Dr. Marlou Sa about this already but didn't feel like she needed it at the time but she said its still bothering her.

## 2017-05-15 NOTE — Telephone Encounter (Signed)
Ok to add

## 2017-05-15 NOTE — Telephone Encounter (Signed)
ok 

## 2017-05-16 ENCOUNTER — Ambulatory Visit: Payer: Medicare Other | Admitting: Physical Therapy

## 2017-05-16 NOTE — Telephone Encounter (Signed)
Right shoulder added to PT order in chart.

## 2017-05-28 ENCOUNTER — Ambulatory Visit: Payer: Medicare Other | Admitting: Physical Therapy

## 2017-05-28 ENCOUNTER — Other Ambulatory Visit: Payer: Self-pay

## 2017-05-28 DIAGNOSIS — I10 Essential (primary) hypertension: Secondary | ICD-10-CM

## 2017-05-28 MED ORDER — LISINOPRIL-HYDROCHLOROTHIAZIDE 20-25 MG PO TABS: 1.0000 | ORAL_TABLET | Freq: Every day | ORAL | 3 refills | Status: DC

## 2017-05-28 MED ORDER — AMLODIPINE BESYLATE 10 MG PO TABS: 10.0000 mg | ORAL_TABLET | Freq: Every day | ORAL | 1 refills | Status: DC

## 2017-05-30 ENCOUNTER — Other Ambulatory Visit: Payer: Self-pay

## 2017-05-30 ENCOUNTER — Ambulatory Visit: Payer: Medicare Other | Attending: Orthopedic Surgery | Admitting: Physical Therapy

## 2017-05-30 ENCOUNTER — Encounter: Payer: Self-pay | Admitting: Physical Therapy

## 2017-05-30 DIAGNOSIS — M6281 Muscle weakness (generalized): Secondary | ICD-10-CM | POA: Diagnosis not present

## 2017-05-30 DIAGNOSIS — R2689 Other abnormalities of gait and mobility: Secondary | ICD-10-CM | POA: Insufficient documentation

## 2017-05-30 DIAGNOSIS — M25561 Pain in right knee: Secondary | ICD-10-CM | POA: Diagnosis not present

## 2017-05-30 DIAGNOSIS — M25562 Pain in left knee: Secondary | ICD-10-CM | POA: Diagnosis not present

## 2017-05-30 DIAGNOSIS — G8929 Other chronic pain: Secondary | ICD-10-CM | POA: Insufficient documentation

## 2017-05-30 NOTE — Therapy (Signed)
[place], [place], [address] Phone: [phone]   Fax:  [phone]  Physical Therapy Evaluation  Patient Details  Name: Alison Ford MRN: [id number] Date of Birth: [birth year] Referring Provider: Belenda Cruise scott dean   Encounter Date: 05/30/2017  PT End of Session - 05/30/17 0819    Visit Number  1    Number of Visits  13    Date for PT Re-Evaluation  07/11/17    Authorization Type  MCR: KX mod by 15th, Prog note by 10th    PT Start Time  0818 pt arrived 18 min late     PT Stop Time  0845    PT Time Calculation (min)  27 min    Activity Tolerance  Patient tolerated treatment well       Past Medical History:  Diagnosis Date  . Abnormal facial hair   . Back pain    d/t knee pain  . Headache(784.0)   . Heart murmur    slight  . HTN (hypertension)    takes Amlodipine daily and HCTZ   . Hyperlipidemia    takes Lovastatin nightly  . Joint pain   . Right knee DJD   . Weakness    left hand    Past Surgical History:  Procedure Laterality Date  . ABDOMINAL HYSTERECTOMY    . Athroscopic knee surgery     Bilateral  . CARPAL TUNNEL RELEASE Right 05/02/2016   Procedure: CARPAL TUNNEL RELEASE;  Surgeon: Meredith Pel, MD;  Location: [place];  Service: Orthopedics;  Laterality: Right;  . CHOLECYSTECTOMY    . DILATION AND CURETTAGE OF UTERUS    . EYE SURGERY Right   . SHOULDER ARTHROSCOPY WITH SUBACROMIAL DECOMPRESSION Right 05/02/2016   Procedure: RIGHT SHOULDER ARTHROSCOPY WITH SUBACROMIAL DECOMPRESSION, MINI OPEN ROTATOR CUFF TEAR REPAIR, AND RIGHT CARPAL TUNNEL RELEASE;  Surgeon: Meredith Pel, MD;  Location: [place];  Service: Orthopedics;  Laterality: Right;  . TOTAL KNEE ARTHROPLASTY Right 07/15/2012   Procedure: RIGHT TOTAL KNEE ARTHROPLASTY;  Surgeon: Lorn Junes, MD;  Location: [place];  Service: Orthopedics;  Laterality: Right;    There were no vitals filed for this  visit.   Subjective Assessment - 05/30/17 0823    Subjective  pt is a 62 y.o F with CC of bil knee pain that has been going on for over 20 years ago and had a R TKA back in 2014. Reports her Md reports she has loosening of the prothesis on the R and she will have to get it replaced. reports pain seems worsening especially over the course of the last few months.     Limitations  Standing;Walking;Lifting    How long can you sit comfortably?  8 min     How long can you stand comfortably?  5 min     How long can you walk comfortably?  5-10 min     Diagnostic tests  X-ray     Patient Stated Goals  to get strength back, improve balance, get back to walking longer    Currently in Pain?  Yes    Pain Score  8     Pain Location  Knee    Pain Orientation  Right;Anterior;Medial;Lateral;Posterior    Pain Descriptors / Indicators  Sharp;Constant;Tingling;Pins and needles;Spasm    Pain Type  Chronic pain    Pain Onset  More than a month ago    Pain Frequency  Constant    Aggravating Factors  standing/ walking, movement    Pain Relieving Factors  sitting , resting    Effect of Pain on Daily Activities  limited standing/ walking endurance    Multiple Pain Sites  Yes    Pain Score  6    Pain Location  Knee    Pain Orientation  Left;Anterior    Pain Descriptors / Indicators  Aching;Sore;Throbbing    Pain Type  Chronic pain    Pain Onset  More than a month ago    Pain Frequency  Constant    Aggravating Factors   worse at night,     Pain Relieving Factors  crossing legs,     Effect of Pain on Daily Activities  limited standing/ walking         [facility] PT Assessment - 05/30/17 0820      Assessment   Medical Diagnosis  R and L leg pain    Referring Provider  gregory scott dean    Onset Date/Surgical Date  -- over 15 years    Hand Dominance  Right    Next MD Visit  -- 3-4 weeks    Prior Therapy  yes for R knee TKA      Precautions   Precautions  None      Restrictions   Weight Bearing  Restrictions  No      Balance Screen   Has the patient fallen in the past 6 months  No      [place] residence    Living Arrangements  Alone    Type of [place] Access  Level entry    Home Layout  One level    [place] - single point quad cane      Prior Function   Level of Independence  Independent with basic ADLs    Vocation  On disability      Cognition   Overall Cognitive Status  Within Functional Limits for tasks assessed      Observation/Other Assessments   Focus on Therapeutic Outcomes (FOTO)   64% limited  predicted 43% limited      Posture/Postural Control   Posture/Postural Control  Postural limitations    Postural Limitations  Rounded Shoulders;Forward head      ROM / Strength   AROM / PROM / Strength  AROM;PROM;Strength      AROM   AROM Assessment Site  Knee    Right/Left Knee  Right;Left    Right Knee Extension  8    Right Knee Flexion  102 ERP    Left Knee Extension  0    Left Knee Flexion  127      PROM   PROM Assessment Site  Knee    Right/Left Knee  Right;Left    Right Knee Extension  5    Right Knee Flexion  113 ERP      Strength   Strength Assessment Site  Knee    Right/Left Knee  Right;Left    Right Knee Flexion  4/5 pain during testing    Right Knee Extension  4/5    Left Knee Flexion  4/5    Left Knee Extension  4/5      Palpation   Patella mobility  hypomobility of the R patella compared bil in all planes    Palpation comment  TTP R knee along  lateral joint line and distal hamstring tendons.  and L knee along thed medial  joint line and semi tendinosus      Ambulation/Gait   Ambulation/Gait  Yes    Gait Pattern  Step-through pattern;Decreased stride length;Trendelenburg;Antalgic                Objective measurements completed on examination: See above findings.              PT Education - 05/30/17 0820    Education provided  Yes    Education  Details  evaluation findings, POC, goals, HEP with proper form/ rationale.     Person(s) Educated  Patient    Methods  Explanation;Verbal cues    Comprehension  Verbalized understanding;Verbal cues required       PT Short Term Goals - 05/30/17 0854      PT SHORT TERM GOAL #1   Title  pt to be I with inital HEP    Time  3    Period  Weeks    Status  New    Target Date  06/20/17      PT SHORT TERM GOAL #2   Title  pt to verbalize and demo techniques to reduce inflammation and swelling via RICE     Time  3    Period  Weeks    Status  New    Target Date  06/20/17        PT Long Term Goals - 05/30/17 0924      PT LONG TERM GOAL #1   Title  pt to increase R knee ROM to 3 - 120 degrees for functional mobility required for functional gait    Time  6    Period  Weeks    Status  New    Target Date  07/11/17      PT LONG TERM GOAL #2   Title  increase bil LE strength to >/=4+/5 to promote knee stability with standing and walking activities    Time  6    Period  Weeks    Status  New    Target Date  07/11/17      PT LONG TERM GOAL #3   Title  pt to be able to sit, stand and walk >/=30 min with </=2/10 pain for functional endurance required for ADLs    Time  6    Period  Weeks    Status  New    Target Date  07/11/17      PT LONG TERM GOAL #4   Title  increase FOTO score to </= 43% limited to demo improvement in function    Time  6    Period  Weeks    Status  New    Target Date  07/11/17      PT LONG TERM GOAL #5   Title  pt will be I with all HEP given as of last visit to maintain and progress current level of function    Time  6    Period  Weeks    Status  New    Target Date  07/11/17             Plan - 05/30/17 0820    Clinical Impression Statement  pt presents to OPPT with CC of bil knee pain thats been going for a long period of time with a hx of previous R TKA. limited assessment due to pt running 18 min late today. She demonstrates functional L knee ROM  and mild limation of  R knee mobility with ERP. She demonstrates functional strength  with soreness noted. She would benefit from physical therapy to improve R knee mobility, increase bil LE strength, increase standing/ walking endurnace and maximize her function by addressing the deficits listed.     History and Personal Factors relevant to plan of care:  hx or R TKA, pt lives alone    Clinical Presentation  Evolving    Clinical Presentation due to:  bil knee pain, limited  R knee mobility, abnormla gait    Clinical Decision Making  Moderate    Rehab Potential  Good    PT Frequency  2x / week    PT Duration  6 weeks    PT Treatment/Interventions  ADLs/Self Care Home Management;Cryotherapy;Electrical Stimulation;Iontophoresis 4mg /ml Dexamethasone;Moist Heat;Dry needling;Vasopneumatic Device;Taping;Therapeutic exercise;Therapeutic activities;Manual techniques;Gait training;Stair training;Patient/family education;Ultrasound;Balance training    PT Next Visit Plan  review/ update HEP, Knee ROM/ strength, assess hip strength, gait training/ balance,     PT Home Exercise Plan  quad set, heel slide, clamshells.     Consulted and Agree with Plan of Care  Patient       Patient will benefit from skilled therapeutic intervention in order to improve the following deficits and impairments:  Abnormal gait, Pain, Decreased activity tolerance, Decreased endurance, Postural dysfunction, Improper body mechanics, Decreased range of motion, Decreased strength  Visit Diagnosis: Chronic pain of right knee  Chronic pain of left knee  Muscle weakness (generalized)  Other abnormalities of gait and mobility     Problem List Patient Active Problem List   Diagnosis Date Noted  . Chronic obstructive pulmonary disease ([place]) 05/30/2016  . Rotator cuff tear 05/02/2016  . Rotator cuff tendonitis, right 12/30/2015  . Cervicalgia 12/30/2015  . Eczema, dyshidrotic 11/18/2014  . Acne 11/18/2014  . Smoker 06/01/2014   . Right knee DJD   . Hyperlipidemia   . HTN (hypertension) 06/05/2012   Starr Lake PT, DPT, LAT, ATC  05/30/17  9:53 AM      [facility] [address] [place], [place], [address] Phone: [phone]   Fax:  [phone]  Name: Alison Ford MRN: [id number] Date of Birth: Dec 13, 1955

## 2017-06-12 ENCOUNTER — Ambulatory Visit: Payer: Medicare Other | Admitting: Physical Therapy

## 2017-06-13 MED FILL — SYMBICORT 80-4.5 MCG INH: 80-4.5 | 30 days supply | Qty: 10 | Fill #1

## 2017-06-13 MED FILL — CROMOLYN 4% EYE DROPS: 4 | 18 days supply | Qty: 10 | Fill #2

## 2017-06-13 MED FILL — LOVASTATIN 40 MG TABS: 40 | 30 days supply | Qty: 30 | Fill #0

## 2017-06-14 ENCOUNTER — Encounter: Payer: Self-pay | Admitting: Physical Therapy

## 2017-06-14 ENCOUNTER — Ambulatory Visit: Payer: Medicare Other | Admitting: Physical Therapy

## 2017-06-14 DIAGNOSIS — R2689 Other abnormalities of gait and mobility: Secondary | ICD-10-CM

## 2017-06-14 DIAGNOSIS — G8929 Other chronic pain: Secondary | ICD-10-CM | POA: Diagnosis not present

## 2017-06-14 DIAGNOSIS — M25562 Pain in left knee: Secondary | ICD-10-CM | POA: Diagnosis not present

## 2017-06-14 DIAGNOSIS — M6281 Muscle weakness (generalized): Secondary | ICD-10-CM | POA: Diagnosis not present

## 2017-06-14 DIAGNOSIS — M25561 Pain in right knee: Secondary | ICD-10-CM | POA: Diagnosis not present

## 2017-06-14 NOTE — Therapy (Signed)
[place], [place], [address] Phone: [phone]   Fax:  [phone]  Physical Therapy Treatment  Patient Details  Name: Alison Ford MRN: [id number] Date of Birth: 11/28/1955 Referring Provider: Belenda Cruise scott dean   Encounter Date: 06/14/2017  PT End of Session - 06/14/17 0954    Visit Number  2    Number of Visits  13    Date for PT Re-Evaluation  07/11/17    PT Start Time  0940 pt checked in 10 min late due to check in error    PT Stop Time  1014    PT Time Calculation (min)  34 min    Activity Tolerance  Patient tolerated treatment well    Behavior During Therapy  [facility] for tasks assessed/performed       Past Medical History:  Diagnosis Date  . Abnormal facial hair   . Back pain    d/t knee pain  . Headache(784.0)   . Heart murmur    slight  . HTN (hypertension)    takes Amlodipine daily and HCTZ   . Hyperlipidemia    takes Lovastatin nightly  . Joint pain   . Right knee DJD   . Weakness    left hand    Past Surgical History:  Procedure Laterality Date  . ABDOMINAL HYSTERECTOMY    . Athroscopic knee surgery     Bilateral  . CARPAL TUNNEL RELEASE Right 05/02/2016   Procedure: CARPAL TUNNEL RELEASE;  Surgeon: Meredith Pel, MD;  Location: [place];  Service: Orthopedics;  Laterality: Right;  . CHOLECYSTECTOMY    . DILATION AND CURETTAGE OF UTERUS    . EYE SURGERY Right   . SHOULDER ARTHROSCOPY WITH SUBACROMIAL DECOMPRESSION Right 05/02/2016   Procedure: RIGHT SHOULDER ARTHROSCOPY WITH SUBACROMIAL DECOMPRESSION, MINI OPEN ROTATOR CUFF TEAR REPAIR, AND RIGHT CARPAL TUNNEL RELEASE;  Surgeon: Meredith Pel, MD;  Location: [place];  Service: Orthopedics;  Laterality: Right;  . TOTAL KNEE ARTHROPLASTY Right 07/15/2012   Procedure: RIGHT TOTAL KNEE ARTHROPLASTY;  Surgeon: Lorn Junes, MD;  Location: [place];  Service: Orthopedics;  Laterality: Right;    There were no vitals filed for  this visit.  Subjective Assessment - 06/14/17 0940    Subjective  "I feel like I am having more pain in both knees today with pain 7/10. I've been doing my exercise the heel slide exercise.    Currently in Pain?  Yes    Pain Score  7     Pain Location  Knee    Pain Orientation  Right;Left;Medial;Lateral;Anterior    Pain Descriptors / Indicators  Aching;Sore;Sharp    Pain Type  Chronic pain    Pain Onset  More than a month ago    Pain Frequency  Constant    Aggravating Factors   standing/ walking, movement    Pain Relieving Factors  sitting, resting                        [facility] Adult PT Treatment/Exercise - 06/14/17 0945      Knee/Hip Exercises: Stretches   Active Hamstring Stretch  2 reps;30 seconds    Quad Stretch  2 reps;30 seconds      Knee/Hip Exercises: Aerobic   Stationary Bike  L1 x 5 min      Knee/Hip Exercises: Standing   Gait Training  walking exggerated heel strike/ toe off 4 x 30 ft  demonstration and intermittent  verbal cues       Knee/Hip Exercises: Seated   Long Arc Quad  2 sets;10 reps      Knee/Hip Exercises: Supine   Quad Sets  2 sets;10 reps verbal cues to count to avoid holding breath    Short Arc Quad Sets  --    Heel Slides  2 sets;10 reps modified to avoid arching the back     Straight Leg Raises  2 sets;10 reps;Strengthening;Both             PT Education - 06/14/17 [phone]    Education provided  Yes    Education Details  reviewed previusly provided HEP and modified heel slide to do without strap. efficient gait pattern utilizing heel strike/ toe off    Person(s) Educated  Patient    Methods  Explanation;Verbal cues;Demonstration    Comprehension  Verbalized understanding;Verbal cues required;Returned demonstration       PT Short Term Goals - 05/30/17 0854      PT SHORT TERM GOAL #1   Title  pt to be I with inital HEP    Time  3    Period  Weeks    Status  New    Target Date  06/20/17      PT SHORT TERM GOAL #2    Title  pt to verbalize and demo techniques to reduce inflammation and swelling via RICE     Time  3    Period  Weeks    Status  New    Target Date  06/20/17        PT Long Term Goals - 05/30/17 0924      PT LONG TERM GOAL #1   Title  pt to increase R knee ROM to 3 - 120 degrees for functional mobility required for functional gait    Time  6    Period  Weeks    Status  New    Target Date  07/11/17      PT LONG TERM GOAL #2   Title  increase bil LE strength to >/=4+/5 to promote knee stability with standing and walking activities    Time  6    Period  Weeks    Status  New    Target Date  07/11/17      PT LONG TERM GOAL #3   Title  pt to be able to sit, stand and walk >/=30 min with </=2/10 pain for functional endurance required for ADLs    Time  6    Period  Weeks    Status  New    Target Date  07/11/17      PT LONG TERM GOAL #4   Title  increase FOTO score to </= 43% limited to demo improvement in function    Time  6    Period  Weeks    Status  New    Target Date  07/11/17      PT LONG TERM GOAL #5   Title  pt will be I with all HEP given as of last visit to maintain and progress current level of function    Time  6    Period  Weeks    Status  New    Target Date  07/11/17            Plan - 06/14/17 0956    Clinical Impression Statement  pt reports increased soreness today which she attributes due to her Heel slide HEP. reviewed HEP and modified  HEP to avoid using strap due to pt compensating. she was able to perofrm all other HEP without cues. Continued hip/ knee strengthening which she perofrmed well but fatiques quickly. end of session she reported pain dropped to 1-2 /10     PT Next Visit Plan  update HEP PRN, Knee ROM/ strength, assess hip strength, gait training/ balance,     PT Home Exercise Plan  quad set, heel slide, clamshells, gait training.     Consulted and Agree with Plan of Care  Patient       Patient will benefit from skilled therapeutic  intervention in order to improve the following deficits and impairments:  Abnormal gait, Pain, Decreased activity tolerance, Decreased endurance, Postural dysfunction, Improper body mechanics, Decreased range of motion, Decreased strength  Visit Diagnosis: Chronic pain of right knee  Chronic pain of left knee  Muscle weakness (generalized)  Other abnormalities of gait and mobility     Problem List Patient Active Problem List   Diagnosis Date Noted  . Chronic obstructive pulmonary disease ([place]) 05/30/2016  . Rotator cuff tear 05/02/2016  . Rotator cuff tendonitis, right 12/30/2015  . Cervicalgia 12/30/2015  . Eczema, dyshidrotic 11/18/2014  . Acne 11/18/2014  . Smoker 06/01/2014  . Right knee DJD   . Hyperlipidemia   . HTN (hypertension) 06/05/2012   Starr Lake PT, DPT, LAT, ATC  06/14/17  10:17 AM      [facility] [address] [place], [place], [address] Phone: [phone]   Fax:  [phone]  Name: Alison Ford MRN: [id number] Date of Birth: 03-12-1955

## 2017-06-19 ENCOUNTER — Encounter: Payer: Self-pay | Admitting: Physical Therapy

## 2017-06-19 ENCOUNTER — Ambulatory Visit: Payer: Medicare Other | Attending: Orthopedic Surgery | Admitting: Physical Therapy

## 2017-06-19 DIAGNOSIS — M25562 Pain in left knee: Secondary | ICD-10-CM | POA: Diagnosis not present

## 2017-06-19 DIAGNOSIS — R2689 Other abnormalities of gait and mobility: Secondary | ICD-10-CM | POA: Insufficient documentation

## 2017-06-19 DIAGNOSIS — M25561 Pain in right knee: Secondary | ICD-10-CM | POA: Insufficient documentation

## 2017-06-19 DIAGNOSIS — M25511 Pain in right shoulder: Secondary | ICD-10-CM | POA: Diagnosis not present

## 2017-06-19 DIAGNOSIS — M6281 Muscle weakness (generalized): Secondary | ICD-10-CM | POA: Diagnosis not present

## 2017-06-19 DIAGNOSIS — G8929 Other chronic pain: Secondary | ICD-10-CM | POA: Diagnosis not present

## 2017-06-19 NOTE — Therapy (Signed)
[place], [place], [address] Phone: [phone]   Fax:  [phone]  Physical Therapy Treatment  Patient Details  Name: Alison Ford MRN: [id number] Date of Birth: [birth year] Referring Provider: Belenda Cruise scott dean   Encounter Date: 06/19/2017  PT End of Session - 06/19/17 1135    Visit Number  3    Number of Visits  13    Date for PT Re-Evaluation  07/11/17    Authorization Type  MCR: KX mod by 15th, Prog note by 10th    PT Start Time  1130    PT Stop Time  1218    PT Time Calculation (min)  48 min       Past Medical History:  Diagnosis Date  . Abnormal facial hair   . Back pain    d/t knee pain  . Headache(784.0)   . Heart murmur    slight  . HTN (hypertension)    takes Amlodipine daily and HCTZ   . Hyperlipidemia    takes Lovastatin nightly  . Joint pain   . Right knee DJD   . Weakness    left hand    Past Surgical History:  Procedure Laterality Date  . ABDOMINAL HYSTERECTOMY    . Athroscopic knee surgery     Bilateral  . CARPAL TUNNEL RELEASE Right 05/02/2016   Procedure: CARPAL TUNNEL RELEASE;  Surgeon: Meredith Pel, MD;  Location: [place];  Service: Orthopedics;  Laterality: Right;  . CHOLECYSTECTOMY    . DILATION AND CURETTAGE OF UTERUS    . EYE SURGERY Right   . SHOULDER ARTHROSCOPY WITH SUBACROMIAL DECOMPRESSION Right 05/02/2016   Procedure: RIGHT SHOULDER ARTHROSCOPY WITH SUBACROMIAL DECOMPRESSION, MINI OPEN ROTATOR CUFF TEAR REPAIR, AND RIGHT CARPAL TUNNEL RELEASE;  Surgeon: Meredith Pel, MD;  Location: [place];  Service: Orthopedics;  Laterality: Right;  . TOTAL KNEE ARTHROPLASTY Right 07/15/2012   Procedure: RIGHT TOTAL KNEE ARTHROPLASTY;  Surgeon: Lorn Junes, MD;  Location: [place];  Service: Orthopedics;  Laterality: Right;    There were no vitals filed for this visit.  Subjective Assessment - 06/19/17 1134    Subjective  I just walked from the corner. The  knee seems to feel a [place] better. I was able to make it.     Currently in Pain?  Yes    Pain Score  5     Pain Location  Knee    Pain Orientation  Right;Left    Pain Descriptors / Indicators  Aching    Aggravating Factors   prolonged standing and walking     Pain Relieving Factors  sitting, rest, using cane.                        [facility] Adult PT Treatment/Exercise - 06/19/17 0001      Knee/Hip Exercises: Stretches   Active Hamstring Stretch  2 reps;30 seconds    Quad Stretch  2 reps;30 seconds    Other Knee/Hip Stretches  slant board stretch x 1 minute       Knee/Hip Exercises: Aerobic   Nustep  L2 x 5 minutes       Knee/Hip Exercises: Machines for Strengthening   Cybex Leg Press  1 plate bilateral x 20       Knee/Hip Exercises: Seated   Long Arc Quad  2 sets;10 reps 2#      Knee/Hip Exercises: Supine   Quad Sets  2 sets;10  reps tactile cues to sustain contraction    Heel Slides  2 sets;10 reps modified to avoid arching the back     Straight Leg Raises  2 sets;10 reps;Strengthening;Both      Modalities   Modalities  Cryotherapy      Cryotherapy   Number Minutes Cryotherapy  10 Minutes    Cryotherapy Location  Knee bilat    Type of Cryotherapy  Ice pack               PT Short Term Goals - 05/30/17 0854      PT SHORT TERM GOAL #1   Title  pt to be I with inital HEP    Time  3    Period  Weeks    Status  New    Target Date  06/20/17      PT SHORT TERM GOAL #2   Title  pt to verbalize and demo techniques to reduce inflammation and swelling via RICE     Time  3    Period  Weeks    Status  New    Target Date  06/20/17        PT Long Term Goals - 05/30/17 0924      PT LONG TERM GOAL #1   Title  pt to increase R knee ROM to 3 - 120 degrees for functional mobility required for functional gait    Time  6    Period  Weeks    Status  New    Target Date  07/11/17      PT LONG TERM GOAL #2   Title  increase bil LE strength to >/=4+/5  to promote knee stability with standing and walking activities    Time  6    Period  Weeks    Status  New    Target Date  07/11/17      PT LONG TERM GOAL #3   Title  pt to be able to sit, stand and walk >/=30 min with </=2/10 pain for functional endurance required for ADLs    Time  6    Period  Weeks    Status  New    Target Date  07/11/17      PT LONG TERM GOAL #4   Title  increase FOTO score to </= 43% limited to demo improvement in function    Time  6    Period  Weeks    Status  New    Target Date  07/11/17      PT LONG TERM GOAL #5   Title  pt will be I with all HEP given as of last visit to maintain and progress current level of function    Time  6    Period  Weeks    Status  New    Target Date  07/11/17            Plan - 06/19/17 1136    Clinical Impression Statement  Pt arrives repoting less pain with distance walking. She walked from the corner over unlevel surfaces to get here and pain did not increase. Began Leg press and added resistance to open chain exercises today without c/o increased pain. Pt with questions regarding shoulder PT. She does have a shoulder referral in our system so she is scheduled for that Evaluation next week. Trial of ice to bilateral knees per patient request.     PT Next Visit Plan  update HEP PRN, Knee ROM/ strength, assess hip  strength, gait training/ balance, assess response to ice, ERO for right shoulder next week.     PT Home Exercise Plan  quad set, heel slide, clamshells, gait training.     Consulted and Agree with Plan of Care  Patient       Patient will benefit from skilled therapeutic intervention in order to improve the following deficits and impairments:  Abnormal gait, Pain, Decreased activity tolerance, Decreased endurance, Postural dysfunction, Improper body mechanics, Decreased range of motion, Decreased strength  Visit Diagnosis: Chronic pain of right knee  Chronic pain of left knee  Muscle weakness  (generalized)  Other abnormalities of gait and mobility     Problem List Patient Active Problem List   Diagnosis Date Noted  . Chronic obstructive pulmonary disease ([place]) 05/30/2016  . Rotator cuff tear 05/02/2016  . Rotator cuff tendonitis, right 12/30/2015  . Cervicalgia 12/30/2015  . Eczema, dyshidrotic 11/18/2014  . Acne 11/18/2014  . Smoker 06/01/2014  . Right knee DJD   . Hyperlipidemia   . HTN (hypertension) 06/05/2012    Dorene Ar, PTA 06/19/2017, 12:06 PM  [facility] [address] [place], [place], [address] Phone: [phone]   Fax:  [phone]  Name: Alison Ford MRN: [id number] Date of Birth: 07-07-1955

## 2017-06-21 ENCOUNTER — Encounter: Payer: Medicare Other | Admitting: Physical Therapy

## 2017-06-26 ENCOUNTER — Encounter: Payer: Self-pay | Admitting: Physical Therapy

## 2017-06-26 ENCOUNTER — Ambulatory Visit: Payer: Medicare Other | Admitting: Physical Therapy

## 2017-06-26 DIAGNOSIS — M25562 Pain in left knee: Secondary | ICD-10-CM

## 2017-06-26 DIAGNOSIS — M25561 Pain in right knee: Principal | ICD-10-CM

## 2017-06-26 DIAGNOSIS — M25511 Pain in right shoulder: Secondary | ICD-10-CM | POA: Diagnosis not present

## 2017-06-26 DIAGNOSIS — M6281 Muscle weakness (generalized): Secondary | ICD-10-CM | POA: Diagnosis not present

## 2017-06-26 DIAGNOSIS — G8929 Other chronic pain: Secondary | ICD-10-CM | POA: Diagnosis not present

## 2017-06-26 DIAGNOSIS — R2689 Other abnormalities of gait and mobility: Secondary | ICD-10-CM

## 2017-06-26 NOTE — Therapy (Signed)
[place], [place], [address] Phone: [phone]   Fax:  [phone]  Physical Therapy Treatment  Patient Details  Name: Alison Ford MRN: [id number] Date of Birth: [birth year] Referring Provider: Belenda Cruise scott dean   Encounter Date: 06/26/2017  PT End of Session - 06/26/17 1202    Visit Number  4    Number of Visits  13    Date for PT Re-Evaluation  07/11/17    Authorization Type  MCR: KX mod by 15th, Prog note by 10th    PT Start Time  1145    PT Stop Time  1227    PT Time Calculation (min)  42 min       Past Medical History:  Diagnosis Date  . Abnormal facial hair   . Back pain    d/t knee pain  . Headache(784.0)   . Heart murmur    slight  . HTN (hypertension)    takes Amlodipine daily and HCTZ   . Hyperlipidemia    takes Lovastatin nightly  . Joint pain   . Right knee DJD   . Weakness    left hand    Past Surgical History:  Procedure Laterality Date  . ABDOMINAL HYSTERECTOMY    . Athroscopic knee surgery     Bilateral  . CARPAL TUNNEL RELEASE Right 05/02/2016   Procedure: CARPAL TUNNEL RELEASE;  Surgeon: Meredith Pel, MD;  Location: [place];  Service: Orthopedics;  Laterality: Right;  . CHOLECYSTECTOMY    . DILATION AND CURETTAGE OF UTERUS    . EYE SURGERY Right   . SHOULDER ARTHROSCOPY WITH SUBACROMIAL DECOMPRESSION Right 05/02/2016   Procedure: RIGHT SHOULDER ARTHROSCOPY WITH SUBACROMIAL DECOMPRESSION, MINI OPEN ROTATOR CUFF TEAR REPAIR, AND RIGHT CARPAL TUNNEL RELEASE;  Surgeon: Meredith Pel, MD;  Location: [place];  Service: Orthopedics;  Laterality: Right;  . TOTAL KNEE ARTHROPLASTY Right 07/15/2012   Procedure: RIGHT TOTAL KNEE ARTHROPLASTY;  Surgeon: Lorn Junes, MD;  Location: [place];  Service: Orthopedics;  Laterality: Right;    There were no vitals filed for this visit.  Subjective Assessment - 06/26/17 1157    Subjective  Stiff today.     Currently in  Pain?  Yes    Pain Score  5     Pain Location  Knee    Pain Orientation  Right    Pain Descriptors / Indicators  Aching;Throbbing    Aggravating Factors   prolonged walking and standing     Pain Relieving Factors  sittng rest, using cane.          [facility] PT Assessment - 06/26/17 0001      PROM   Right Knee Flexion  118 ERP      Ambulation/Gait   Gait Pattern  Step-through pattern;Decreased stride length;Trendelenburg;Antalgic                   [facility] Adult PT Treatment/Exercise - 06/26/17 0001      Knee/Hip Exercises: Stretches   Active Hamstring Stretch  2 reps;30 seconds    Quad Stretch  2 reps;30 seconds    Other Knee/Hip Stretches  slant board stretch x 1 minute       Knee/Hip Exercises: Aerobic   Nustep  L2 x 4 minutes       Knee/Hip Exercises: Machines for Strengthening   Cybex Leg Press  1 plate bilateral x 20 , 2 plates x 15      Knee/Hip Exercises: Standing  Knee Flexion  10 reps;Right;Left;2 sets    Functional Squat  10 reps    SLS  20 sec left, 8 sec right      Knee/Hip Exercises: Seated   Long Arc Quad  2 sets;10 reps 3#      Knee/Hip Exercises: Supine   Bridges  10 reps    Straight Leg Raises  2 sets;10 reps;Strengthening;Both      Knee/Hip Exercises: Sidelying   Hip ABduction  10 reps;Left    Hip ABduction Limitations  right pain ful    Clams  x10                PT Short Term Goals - 05/30/17 0854      PT SHORT TERM GOAL #1   Title  pt to be I with inital HEP    Time  3    Period  Weeks    Status  New    Target Date  06/20/17      PT SHORT TERM GOAL #2   Title  pt to verbalize and demo techniques to reduce inflammation and swelling via RICE     Time  3    Period  Weeks    Status  New    Target Date  06/20/17        PT Long Term Goals - 05/30/17 0924      PT LONG TERM GOAL #1   Title  pt to increase R knee ROM to 3 - 120 degrees for functional mobility required for functional gait    Time  6    Period  Weeks     Status  New    Target Date  07/11/17      PT LONG TERM GOAL #2   Title  increase bil LE strength to >/=4+/5 to promote knee stability with standing and walking activities    Time  6    Period  Weeks    Status  New    Target Date  07/11/17      PT LONG TERM GOAL #3   Title  pt to be able to sit, stand and walk >/=30 min with </=2/10 pain for functional endurance required for ADLs    Time  6    Period  Weeks    Status  New    Target Date  07/11/17      PT LONG TERM GOAL #4   Title  increase FOTO score to </= 43% limited to demo improvement in function    Time  6    Period  Weeks    Status  New    Target Date  07/11/17      PT LONG TERM GOAL #5   Title  pt will be I with all HEP given as of last visit to maintain and progress current level of function    Time  6    Period  Weeks    Status  New    Target Date  07/11/17            Plan - 06/26/17 1219    Clinical Impression Statement  Pt reports able to walk 10 minutes on her road without rest and no increase in pain. Her PROM knee flexion is improved. Increased tolerance with therex today. Able to add more standing exercises. Began hip abduction strength on mat. She continues to demonstrate trendelenberg gait.     PT Next Visit Plan  update HEP PRN, Knee ROM/ strength, assess hip strength,  gait training/ balance, lateral hip strength-try hip hikes?  did not like ice, ERO for right shoulder next week.     PT Home Exercise Plan  quad set, heel slide, clamshells, gait training.     Consulted and Agree with Plan of Care  Patient       Patient will benefit from skilled therapeutic intervention in order to improve the following deficits and impairments:  Abnormal gait, Pain, Decreased activity tolerance, Decreased endurance, Postural dysfunction, Improper body mechanics, Decreased range of motion, Decreased strength  Visit Diagnosis: Chronic pain of right knee  Chronic pain of left knee  Muscle weakness  (generalized)  Other abnormalities of gait and mobility     Problem List Patient Active Problem List   Diagnosis Date Noted  . Chronic obstructive pulmonary disease ([place]) 05/30/2016  . Rotator cuff tear 05/02/2016  . Rotator cuff tendonitis, right 12/30/2015  . Cervicalgia 12/30/2015  . Eczema, dyshidrotic 11/18/2014  . Acne 11/18/2014  . Smoker 06/01/2014  . Right knee DJD   . Hyperlipidemia   . HTN (hypertension) 06/05/2012    Dorene Ar, PTA 06/26/2017, 12:27 PM  [facility] [address] [place], [place], [address] Phone: [phone]   Fax:  [phone]  Name: ADANELY REYNOSO MRN: [id number] Date of Birth: 04/28/1955

## 2017-06-28 ENCOUNTER — Ambulatory Visit: Payer: Medicare Other | Admitting: Physical Therapy

## 2017-07-03 ENCOUNTER — Encounter: Payer: Self-pay | Admitting: Physical Therapy

## 2017-07-03 ENCOUNTER — Ambulatory Visit: Payer: Medicare Other | Admitting: Physical Therapy

## 2017-07-03 DIAGNOSIS — M6281 Muscle weakness (generalized): Secondary | ICD-10-CM

## 2017-07-03 DIAGNOSIS — R2689 Other abnormalities of gait and mobility: Secondary | ICD-10-CM | POA: Diagnosis not present

## 2017-07-03 DIAGNOSIS — M25561 Pain in right knee: Principal | ICD-10-CM

## 2017-07-03 DIAGNOSIS — M25511 Pain in right shoulder: Secondary | ICD-10-CM | POA: Diagnosis not present

## 2017-07-03 DIAGNOSIS — M25562 Pain in left knee: Secondary | ICD-10-CM | POA: Diagnosis not present

## 2017-07-03 DIAGNOSIS — G8929 Other chronic pain: Secondary | ICD-10-CM | POA: Diagnosis not present

## 2017-07-03 NOTE — Patient Instructions (Signed)
Single Leg - Eyes Open    Holding support, lift right leg while maintaining balance over other leg. Progress to removing hands from support surface for longer periods of time. Hold__30-60sec__ seconds. Repeat __2__ times per session. Do __2__ sessions per day.  Copyright  VHI. All rights reserved.  Abduction: Clam (Eccentric) - Side-Lying    Lie on side with knees bent. Lift top knee, keeping feet together. Keep trunk steady. Slowly lower for 3-5 seconds. _20_ reps per set, _2__ sets per day, _7__ days per week.

## 2017-07-03 NOTE — Therapy (Signed)
[place], [place], [address] Phone: [phone]   Fax:  [phone]  Physical Therapy Treatment  Patient Details  Name: Alison Ford MRN: [id number] Date of Birth: 1955/06/05 Referring Provider: Belenda Cruise scott dean   Encounter Date: 07/03/2017  PT End of Session - 07/03/17 1149    Visit Number  5    Number of Visits  13    Date for PT Re-Evaluation  07/11/17    Authorization Type  MCR: KX mod by 15th, Prog note by 10th    PT Start Time  1143    PT Stop Time  1225    PT Time Calculation (min)  42 min       Past Medical History:  Diagnosis Date  . Abnormal facial hair   . Back pain    d/t knee pain  . Headache(784.0)   . Heart murmur    slight  . HTN (hypertension)    takes Amlodipine daily and HCTZ   . Hyperlipidemia    takes Lovastatin nightly  . Joint pain   . Right knee DJD   . Weakness    left hand    Past Surgical History:  Procedure Laterality Date  . ABDOMINAL HYSTERECTOMY    . Athroscopic knee surgery     Bilateral  . CARPAL TUNNEL RELEASE Right 05/02/2016   Procedure: CARPAL TUNNEL RELEASE;  Surgeon: Meredith Pel, MD;  Location: [place];  Service: Orthopedics;  Laterality: Right;  . CHOLECYSTECTOMY    . DILATION AND CURETTAGE OF UTERUS    . EYE SURGERY Right   . SHOULDER ARTHROSCOPY WITH SUBACROMIAL DECOMPRESSION Right 05/02/2016   Procedure: RIGHT SHOULDER ARTHROSCOPY WITH SUBACROMIAL DECOMPRESSION, MINI OPEN ROTATOR CUFF TEAR REPAIR, AND RIGHT CARPAL TUNNEL RELEASE;  Surgeon: Meredith Pel, MD;  Location: [place];  Service: Orthopedics;  Laterality: Right;  . TOTAL KNEE ARTHROPLASTY Right 07/15/2012   Procedure: RIGHT TOTAL KNEE ARTHROPLASTY;  Surgeon: Lorn Junes, MD;  Location: [place];  Service: Orthopedics;  Laterality: Right;    There were no vitals filed for this visit.  Subjective Assessment - 07/03/17 1146    Subjective  Had some pain in left knee with  walking yesterday.     Currently in Pain?  No/denies                       [facility] Adult PT Treatment/Exercise - 07/03/17 0001      Knee/Hip Exercises: Stretches   Active Hamstring Stretch  2 reps;30 seconds    Quad Stretch  2 reps;30 seconds    Other Knee/Hip Stretches  slant board stretch x 1 minute       Knee/Hip Exercises: Aerobic   Stationary Bike  L1 x 5 min      Knee/Hip Exercises: Standing   Knee Flexion  10 reps;Right;Left;2 sets    Hip Abduction  10 reps;Both    Hip Extension  10 reps;Both    Functional Squat  10 reps    SLS  27 left, 15 sec right    Other Standing Knee Exercises  hip hikes off edge of 4 inch step 10 x 2 bilateral      Knee/Hip Exercises: Seated   Sit to Sand  10 reps;without UE support      Knee/Hip Exercises: Supine   Bridges  15 reps    Single Leg Bridge  10 reps;Left;Both      Knee/Hip Exercises: Sidelying  Hip ABduction  10 reps;Right;Left    Clams  x 10 each              PT Education - 07/03/17 1214    Education provided  Yes    Education Details  HEP     Person(s) Educated  Patient    Methods  Explanation;Handout    Comprehension  Verbalized understanding       PT Short Term Goals - 05/30/17 0854      PT SHORT TERM GOAL #1   Title  pt to be I with inital HEP    Time  3    Period  Weeks    Status  New    Target Date  06/20/17      PT SHORT TERM GOAL #2   Title  pt to verbalize and demo techniques to reduce inflammation and swelling via RICE     Time  3    Period  Weeks    Status  New    Target Date  06/20/17        PT Long Term Goals - 05/30/17 0924      PT LONG TERM GOAL #1   Title  pt to increase R knee ROM to 3 - 120 degrees for functional mobility required for functional gait    Time  6    Period  Weeks    Status  New    Target Date  07/11/17      PT LONG TERM GOAL #2   Title  increase bil LE strength to >/=4+/5 to promote knee stability with standing and walking activities    Time  6     Period  Weeks    Status  New    Target Date  07/11/17      PT LONG TERM GOAL #3   Title  pt to be able to sit, stand and walk >/=30 min with </=2/10 pain for functional endurance required for ADLs    Time  6    Period  Weeks    Status  New    Target Date  07/11/17      PT LONG TERM GOAL #4   Title  increase FOTO score to </= 43% limited to demo improvement in function    Time  6    Period  Weeks    Status  New    Target Date  07/11/17      PT LONG TERM GOAL #5   Title  pt will be I with all HEP given as of last visit to maintain and progress current level of function    Time  6    Period  Weeks    Status  New    Target Date  07/11/17            Plan - 07/03/17 1210    Clinical Impression Statement  Pt reports still limted to 10 minute walk prior to increased knee pain. Yesterday it was the left knee that started hurting first. SLS improved today. Able to advance to dynamic balance with min UE support. Began hip hikes to strengthen glute med. She tolerated increased reps with right hip abduction. Updated HEP with SLS and hip abduction    PT Next Visit Plan  Evaluate shoulder; update HEP PRN, Knee ROM/ strength, assess hip strength, gait training/ balance, lateral hip strength-try hip hikes?  did not like ice on knees    PT Home Exercise Plan  quad set, heel slide, clamshells,  gait training. SLS, side hip abduction       Patient will benefit from skilled therapeutic intervention in order to improve the following deficits and impairments:  Abnormal gait, Pain, Decreased activity tolerance, Decreased endurance, Postural dysfunction, Improper body mechanics, Decreased range of motion, Decreased strength  Visit Diagnosis: Chronic pain of right knee  Chronic pain of left knee  Muscle weakness (generalized)  Other abnormalities of gait and mobility     Problem List Patient Active Problem List   Diagnosis Date Noted  . Chronic obstructive pulmonary disease ([place])  05/30/2016  . Rotator cuff tear 05/02/2016  . Rotator cuff tendonitis, right 12/30/2015  . Cervicalgia 12/30/2015  . Eczema, dyshidrotic 11/18/2014  . Acne 11/18/2014  . Smoker 06/01/2014  . Right knee DJD   . Hyperlipidemia   . HTN (hypertension) 06/05/2012    Dorene Ar, PTA 07/03/2017, 12:25 PM  [facility] [address] [place], [place], [address] Phone: [phone]   Fax:  [phone]  Name: Alison Ford MRN: [id number] Date of Birth: Nov 16, 1955

## 2017-07-05 ENCOUNTER — Encounter: Payer: Self-pay | Admitting: Physical Therapy

## 2017-07-05 ENCOUNTER — Ambulatory Visit: Payer: Medicare Other | Admitting: Physical Therapy

## 2017-07-05 DIAGNOSIS — M25562 Pain in left knee: Secondary | ICD-10-CM

## 2017-07-05 DIAGNOSIS — M25561 Pain in right knee: Secondary | ICD-10-CM | POA: Diagnosis not present

## 2017-07-05 DIAGNOSIS — G8929 Other chronic pain: Secondary | ICD-10-CM

## 2017-07-05 DIAGNOSIS — M6281 Muscle weakness (generalized): Secondary | ICD-10-CM

## 2017-07-05 DIAGNOSIS — M25511 Pain in right shoulder: Secondary | ICD-10-CM

## 2017-07-05 DIAGNOSIS — R2689 Other abnormalities of gait and mobility: Secondary | ICD-10-CM

## 2017-07-05 NOTE — Therapy (Signed)
[place], [place], [address] Phone: [phone]   Fax:  [phone]  Physical Therapy Treatment / Re-evaluation  Patient Details  Name: Alison Ford MRN: [id number] Date of Birth: 11-30-1955 Referring Provider: Belenda Cruise scott dean   Encounter Date: 07/05/2017  PT End of Session - 07/05/17 1219    Visit Number  6    Number of Visits  14    Date for PT Re-Evaluation  08/02/17    Authorization Type  MCR: KX mod by 15th, Prog note by 10th    PT Start Time  1150    PT Stop Time  1229    PT Time Calculation (min)  39 min    Activity Tolerance  Patient tolerated treatment well    Behavior During Therapy  [facility] for tasks assessed/performed       Past Medical History:  Diagnosis Date  . Abnormal facial hair   . Back pain    d/t knee pain  . Headache(784.0)   . Heart murmur    slight  . HTN (hypertension)    takes Amlodipine daily and HCTZ   . Hyperlipidemia    takes Lovastatin nightly  . Joint pain   . Right knee DJD   . Weakness    left hand    Past Surgical History:  Procedure Laterality Date  . ABDOMINAL HYSTERECTOMY    . Athroscopic knee surgery     Bilateral  . CARPAL TUNNEL RELEASE Right 05/02/2016   Procedure: CARPAL TUNNEL RELEASE;  Surgeon: Meredith Pel, MD;  Location: [place];  Service: Orthopedics;  Laterality: Right;  . CHOLECYSTECTOMY    . DILATION AND CURETTAGE OF UTERUS    . EYE SURGERY Right   . SHOULDER ARTHROSCOPY WITH SUBACROMIAL DECOMPRESSION Right 05/02/2016   Procedure: RIGHT SHOULDER ARTHROSCOPY WITH SUBACROMIAL DECOMPRESSION, MINI OPEN ROTATOR CUFF TEAR REPAIR, AND RIGHT CARPAL TUNNEL RELEASE;  Surgeon: Meredith Pel, MD;  Location: [place];  Service: Orthopedics;  Laterality: Right;  . TOTAL KNEE ARTHROPLASTY Right 07/15/2012   Procedure: RIGHT TOTAL KNEE ARTHROPLASTY;  Surgeon: Lorn Junes, MD;  Location: [place];  Service: Orthopedics;  Laterality: Right;     There were no vitals filed for this visit.  Subjective Assessment - 07/05/17 1153    Subjective  pt reports pain in the R shoulder been going on for the last 3-4 months ago with no specific traumatic onset. Reports pain is more achey and stays mostly in the shoulder and reports increased incidence of dropping things with the R arm. no hx or R shoulder problems but had a rotator cuff surgery last May.     Limitations  Lifting    Pain Score  0-No pain    Multiple Pain Sites  Yes    Pain Score  0    Pain Score  7 at worst 8-9/10. last took medication at 8am    Pain Location  Shoulder    Pain Orientation  Right;Upper    Pain Descriptors / Indicators  Aching;Throbbing    Pain Type  Chronic pain    Pain Onset  More than a month ago    Pain Frequency  Constant    Aggravating Factors   lifting, gripping with the R hand,     Pain Relieving Factors  ice, medication, topical ointment         [facility] PT Assessment - 07/05/17 1158      AROM   AROM Assessment Site  Shoulder    Right/Left Shoulder  Right;Left    Right Shoulder Extension  54 Degrees    Right Shoulder Flexion  150 Degrees    Right Shoulder ABduction  110 Degrees    Right Shoulder Internal Rotation  28 Degrees    Right Shoulder External Rotation  73 Degrees    Left Shoulder Extension  53 Degrees    Left Shoulder Flexion  157 Degrees    Left Shoulder ABduction  124 Degrees    Left Shoulder Internal Rotation  10 Degrees    Left Shoulder External Rotation  70 Degrees      Strength   Strength Assessment Site  Shoulder;Hand    Right/Left Shoulder  Right;Left    Right Shoulder Flexion  3+/5    Right Shoulder Extension  5/5    Right Shoulder ABduction  3+/5 pain during testing    Right Shoulder Internal Rotation  4+/5 pain during testing    Right Shoulder External Rotation  4+/5    Left Shoulder Flexion  4-/5    Left Shoulder Extension  5/5    Left Shoulder ABduction  4-/5    Left Shoulder Internal Rotation  4+/5    Left  Shoulder External Rotation  4+/5    Right Hand Grip (lbs)  40.3 43,36,42    Left Hand Grip (lbs)  50.6 55,50,47      Palpation   Palpation comment  TTP noted along the supraspinatus, multiple trigger points in upper traps/ levator scapulae, teres minor,       Special Tests    Special Tests  Rotator Cuff Impingement    Rotator Cuff Impingment tests  Empty Can test;Full Can test;Hawkins- Merrilyn Puma test;Painful Arc of Motion;other      Hawkins-Kennedy test   Findings  Positive    Side  Right    Comments  horizontal adducted position      Empty Can test   Findings  Positive    Side  Right      Full Can test   Findings  Negative      Painful Arc of Motion   Findings  Negative      other   Findings  Positive    Side  Right    Comments  scapular assist test                   [facility] Adult PT Treatment/Exercise - 07/05/17 1229      Shoulder Exercises: Supine   Protraction  10 reps tactile cues for proper form      Shoulder Exercises: Stretch   Other Shoulder Stretches  upper trap stretch 2 x 30     Other Shoulder Stretches  Rhomboid stretch 2 x 30 sec             PT Education - 07/05/17 1238    Education provided  Yes    Education Details  shoulder evaluation findings, HEP with proper form/ rationale. anatomy of the shoulder,  using skeleton for demonstration    Person(s) Educated  Patient    Methods  Explanation;Verbal cues;Handout;Demonstration    Comprehension  Verbalized understanding;Verbal cues required;Returned demonstration       PT Short Term Goals - 07/05/17 1234      PT SHORT TERM GOAL #1   Title  pt to be I with inital HEP    Time  3    Period  Weeks    Status  Achieved      PT SHORT TERM  GOAL #2   Title  pt to verbalize and demo techniques to reduce inflammation and swelling via RICE     Time  3    Period  Weeks    Status  Achieved        PT Long Term Goals - 07/05/17 1234      PT LONG TERM GOAL #1   Title  pt to increase R  knee ROM to 3 - 120 degrees for functional mobility required for functional gait    Time  6    Period  Weeks    Status  On-going    Target Date  08/02/17      PT LONG TERM GOAL #2   Title  increase bil LE strength to >/=4+/5 to promote knee stability with standing and walking activities    Time  6    Period  Weeks    Status  On-going    Target Date  08/02/17      PT LONG TERM GOAL #3   Title  pt to be able to sit, stand and walk >/=30 min with </=2/10 pain for functional endurance required for ADLs    Time  6    Period  Weeks    Status  On-going    Target Date  08/02/17      PT LONG TERM GOAL #4   Title  increase FOTO score to </= 43% limited to demo improvement in function    Time  6    Period  Weeks    Status  On-going    Target Date  08/02/17      PT LONG TERM GOAL #5   Title  pt will be I with all HEP given as of last visit to maintain and progress current level of function    Time  6    Period  Weeks    Status  On-going    Target Date  08/02/17      Additional Long Term Goals   Additional Long Term Goals  Yes      PT LONG TERM GOAL #6   Title  improve shoulder flexion/ abduction by >/= 10 degrees with </= 1/10 pain for functional mobility required for ADLs    Time  4    Period  Weeks    Status  New    Target Date  08/02/17      PT LONG TERM GOAL #7   Title  increase R shoulder strength to >/= 4+/5 in all planes to promote stability and proper rhythm to prevent pain or impingement     Time  4    Period  Weeks    Status  New    Target Date  08/02/17            Plan - 07/05/17 1230    Clinical Impression Statement  Re-assessment today to add shoulder to POC. non-truamatic onset starting 3-4 months ago. mild limitations with R shoulder mobility and strength compared bil. special testing is consistent with impingment of possible supraspinatus. She would beneift from adding shoulder to POC to reduce pain to reduce pain, improve mobility, increase strength and  maximize her function by addressing the deficits listed.     PT Frequency  2x / week    PT Duration  4 weeks    PT Treatment/Interventions  ADLs/Self Care Home Management;Cryotherapy;Electrical Stimulation;Iontophoresis 4mg /ml Dexamethasone;Moist Heat;Dry needling;Vasopneumatic Device;Taping;Therapeutic exercise;Therapeutic activities;Manual techniques;Gait training;Stair training;Patient/family education;Ultrasound;Balance training    PT Next Visit Plan  continued  hip/ knee strengthening progressing as able. shoulder: scapular stability scapular mobs promotting upward rotation and rhythm. modalities PRN, review and update HEP PRN    PT Home Exercise Plan  quad set, heel slide, clamshells, gait training. SLS, side hip abduction, ceiling punches, upper trap stretch, rhomboid stretch     Consulted and Agree with Plan of Care  Patient       Patient will benefit from skilled therapeutic intervention in order to improve the following deficits and impairments:  Abnormal gait, Pain, Decreased activity tolerance, Decreased endurance, Postural dysfunction, Improper body mechanics, Decreased range of motion, Decreased strength, Impaired UE functional use  Visit Diagnosis: Chronic pain of right knee  Chronic pain of left knee  Muscle weakness (generalized)  Other abnormalities of gait and mobility  Chronic right shoulder pain     Problem List Patient Active Problem List   Diagnosis Date Noted  . Chronic obstructive pulmonary disease ([place]) 05/30/2016  . Rotator cuff tear 05/02/2016  . Rotator cuff tendonitis, right 12/30/2015  . Cervicalgia 12/30/2015  . Eczema, dyshidrotic 11/18/2014  . Acne 11/18/2014  . Smoker 06/01/2014  . Right knee DJD   . Hyperlipidemia   . HTN (hypertension) 06/05/2012   Starr Lake PT, DPT, LAT, ATC  07/05/17  12:43 PM      [facility] [address] [place], [place], [address] Phone: [phone]    Fax:  [phone]  Name: Alison Ford MRN: [id number] Date of Birth: 04-Sep-1955

## 2017-07-10 ENCOUNTER — Ambulatory Visit: Payer: Medicare Other | Admitting: Physical Therapy

## 2017-07-11 ENCOUNTER — Other Ambulatory Visit: Payer: Self-pay

## 2017-07-11 NOTE — Telephone Encounter (Signed)
Dr. Wynetta Emery the pharmacy is also requesting   polyeth Glyc 3350 NF Pow  255 quantiy Sig: Take 17G by mouth daily prn for moderate constipation

## 2017-07-12 ENCOUNTER — Ambulatory Visit: Payer: Medicare Other | Admitting: Physical Therapy

## 2017-07-12 ENCOUNTER — Encounter: Payer: Self-pay | Admitting: Physical Therapy

## 2017-07-12 ENCOUNTER — Other Ambulatory Visit: Payer: Self-pay

## 2017-07-12 DIAGNOSIS — M25562 Pain in left knee: Secondary | ICD-10-CM

## 2017-07-12 DIAGNOSIS — G8929 Other chronic pain: Secondary | ICD-10-CM

## 2017-07-12 DIAGNOSIS — M25511 Pain in right shoulder: Secondary | ICD-10-CM

## 2017-07-12 DIAGNOSIS — R2689 Other abnormalities of gait and mobility: Secondary | ICD-10-CM

## 2017-07-12 DIAGNOSIS — M25561 Pain in right knee: Principal | ICD-10-CM

## 2017-07-12 DIAGNOSIS — M6281 Muscle weakness (generalized): Secondary | ICD-10-CM

## 2017-07-12 MED ORDER — ASPIRIN EC 81 MG PO TBEC: 81.0000 mg | DELAYED_RELEASE_TABLET | Freq: Every day | ORAL | 3 refills | Status: AC

## 2017-07-12 MED ORDER — POLYETHYLENE GLYCOL 3350 17 GM/SCOOP PO POWD: 17.0000 g | Freq: Two times a day (BID) | ORAL | 1 refills | Status: DC | PRN

## 2017-07-12 MED ORDER — LOVASTATIN 40 MG PO TABS: ORAL_TABLET | ORAL | 2 refills | Status: DC

## 2017-07-12 NOTE — Therapy (Signed)
[place], [place], [address] Phone: [phone]   Fax:  [phone]  Physical Therapy Treatment  Patient Details  Name: Alison Ford MRN: [id number] Date of Birth: 14-Mar-1955 Referring Provider: Belenda Cruise scott dean   Encounter Date: 07/12/2017    Past Medical History:  Diagnosis Date  . Abnormal facial hair   . Back pain    d/t knee pain  . Headache(784.0)   . Heart murmur    slight  . HTN (hypertension)    takes Amlodipine daily and HCTZ   . Hyperlipidemia    takes Lovastatin nightly  . Joint pain   . Right knee DJD   . Weakness    left hand    Past Surgical History:  Procedure Laterality Date  . ABDOMINAL HYSTERECTOMY    . Athroscopic knee surgery     Bilateral  . CARPAL TUNNEL RELEASE Right 05/02/2016   Procedure: CARPAL TUNNEL RELEASE;  Surgeon: Meredith Pel, MD;  Location: [place];  Service: Orthopedics;  Laterality: Right;  . CHOLECYSTECTOMY    . DILATION AND CURETTAGE OF UTERUS    . EYE SURGERY Right   . SHOULDER ARTHROSCOPY WITH SUBACROMIAL DECOMPRESSION Right 05/02/2016   Procedure: RIGHT SHOULDER ARTHROSCOPY WITH SUBACROMIAL DECOMPRESSION, MINI OPEN ROTATOR CUFF TEAR REPAIR, AND RIGHT CARPAL TUNNEL RELEASE;  Surgeon: Meredith Pel, MD;  Location: [place];  Service: Orthopedics;  Laterality: Right;  . TOTAL KNEE ARTHROPLASTY Right 07/15/2012   Procedure: RIGHT TOTAL KNEE ARTHROPLASTY;  Surgeon: Lorn Junes, MD;  Location: [place];  Service: Orthopedics;  Laterality: Right;    There were no vitals filed for this visit.  Subjective Assessment - 07/12/17 1149    Subjective  "                                 PT Short Term Goals - 07/05/17 1234      PT SHORT TERM GOAL #1   Title  pt to be I with inital HEP    Time  3    Period  Weeks    Status  Achieved      PT SHORT TERM GOAL #2   Title  pt to verbalize and demo techniques to reduce  inflammation and swelling via RICE     Time  3    Period  Weeks    Status  Achieved        PT Long Term Goals - 07/05/17 1234      PT LONG TERM GOAL #1   Title  pt to increase R knee ROM to 3 - 120 degrees for functional mobility required for functional gait    Time  6    Period  Weeks    Status  On-going    Target Date  08/02/17      PT LONG TERM GOAL #2   Title  increase bil LE strength to >/=4+/5 to promote knee stability with standing and walking activities    Time  6    Period  Weeks    Status  On-going    Target Date  08/02/17      PT LONG TERM GOAL #3   Title  pt to be able to sit, stand and walk >/=30 min with </=2/10 pain for functional endurance required for ADLs    Time  6    Period  Weeks    Status  On-going    Target Date  08/02/17      PT LONG TERM GOAL #4   Title  increase FOTO score to </= 43% limited to demo improvement in function    Time  6    Period  Weeks    Status  On-going    Target Date  08/02/17      PT LONG TERM GOAL #5   Title  pt will be I with all HEP given as of last visit to maintain and progress current level of function    Time  6    Period  Weeks    Status  On-going    Target Date  08/02/17      Additional Long Term Goals   Additional Long Term Goals  Yes      PT LONG TERM GOAL #6   Title  improve shoulder flexion/ abduction by >/= 10 degrees with </= 1/10 pain for functional mobility required for ADLs    Time  4    Period  Weeks    Status  New    Target Date  08/02/17      PT LONG TERM GOAL #7   Title  increase R shoulder strength to >/= 4+/5 in all planes to promote stability and proper rhythm to prevent pain or impingement     Time  4    Period  Weeks    Status  New    Target Date  08/02/17            Plan - 07/12/17 1154    Clinical Impression Statement  pt reports walking in she had to cancel last visit because she was sick and stated she still felt like she was running a bit of a fever and was unsure if she  needed to come into today and was hoping she could just get put on a machine. I spoke with her stating if she is feeling sick that she doesn't need to be at therapy to avoid potential compromise of other patients and staff and that we can reschedule today's visit.     Consulted and Agree with Plan of Care  Patient       Patient will benefit from skilled therapeutic intervention in order to improve the following deficits and impairments:  Abnormal gait, Pain, Decreased activity tolerance, Decreased endurance, Postural dysfunction, Improper body mechanics, Decreased range of motion, Decreased strength, Impaired UE functional use  Visit Diagnosis: Chronic pain of right knee  Chronic pain of left knee  Muscle weakness (generalized)  Other abnormalities of gait and mobility  Chronic right shoulder pain     Problem List Patient Active Problem List   Diagnosis Date Noted  . Chronic obstructive pulmonary disease ([place]) 05/30/2016  . Rotator cuff tear 05/02/2016  . Rotator cuff tendonitis, right 12/30/2015  . Cervicalgia 12/30/2015  . Eczema, dyshidrotic 11/18/2014  . Acne 11/18/2014  . Smoker 06/01/2014  . Right knee DJD   . Hyperlipidemia   . HTN (hypertension) 06/05/2012   Starr Lake PT, DPT, LAT, ATC  07/12/17  12:01 PM      [facility] [address] [place], [place], [address] Phone: [phone]   Fax:  [phone]  Name: Alison Ford MRN: [id number] Date of Birth: 1956-01-02

## 2017-07-17 ENCOUNTER — Ambulatory Visit: Payer: Medicare Other | Admitting: Physical Therapy

## 2017-07-23 ENCOUNTER — Ambulatory Visit: Payer: Medicare Other | Admitting: Internal Medicine

## 2017-07-24 ENCOUNTER — Encounter: Payer: Medicare Other | Admitting: Physical Therapy

## 2017-07-24 ENCOUNTER — Telehealth: Payer: Self-pay | Admitting: Physical Therapy

## 2017-07-24 NOTE — Telephone Encounter (Signed)
No-show. Called [facility] regarding time/date of next scheduled session.  Deniece Ree PT, DPT, CBIS  Supplemental Physical Therapist [facility]   Pager [phone]

## 2017-07-26 ENCOUNTER — Encounter: Payer: Self-pay | Admitting: Physical Therapy

## 2017-07-26 ENCOUNTER — Ambulatory Visit: Payer: Medicare Other | Attending: Orthopedic Surgery | Admitting: Physical Therapy

## 2017-07-26 DIAGNOSIS — M25562 Pain in left knee: Secondary | ICD-10-CM | POA: Diagnosis not present

## 2017-07-26 DIAGNOSIS — M6281 Muscle weakness (generalized): Secondary | ICD-10-CM | POA: Diagnosis not present

## 2017-07-26 DIAGNOSIS — M25561 Pain in right knee: Secondary | ICD-10-CM | POA: Insufficient documentation

## 2017-07-26 DIAGNOSIS — G8929 Other chronic pain: Secondary | ICD-10-CM

## 2017-07-26 DIAGNOSIS — R2689 Other abnormalities of gait and mobility: Secondary | ICD-10-CM | POA: Diagnosis not present

## 2017-07-26 DIAGNOSIS — M25511 Pain in right shoulder: Secondary | ICD-10-CM | POA: Diagnosis not present

## 2017-07-26 NOTE — Patient Instructions (Signed)
Issued from cabinet : Rockwood all exercises 10 reps each , 2 x per day     Standing Row and shoulder extension 10 reps each, 2 x per day     Red band

## 2017-07-26 NOTE — Therapy (Addendum)
[place], [place], [address] Phone: [phone]   Fax:  [phone]  Physical Therapy Treatment  Patient Details  Name: Alison Ford MRN: [id number] Date of Birth: 30-Nov-1955 Referring Provider: Belenda Cruise scott dean   Encounter Date: 07/26/2017  PT End of Session - 07/26/17 1127    Visit Number  7    Number of Visits  14    Date for PT Re-Evaluation  08/02/17    Authorization Type  MCR: KX mod by 15th, Prog note by 10th    PT Start Time  1104    PT Stop Time  1142    PT Time Calculation (min)  38 min       Past Medical History:  Diagnosis Date  . Abnormal facial hair   . Back pain    d/t knee pain  . Headache(784.0)   . Heart murmur    slight  . HTN (hypertension)    takes Amlodipine daily and HCTZ   . Hyperlipidemia    takes Lovastatin nightly  . Joint pain   . Right knee DJD   . Weakness    left hand    Past Surgical History:  Procedure Laterality Date  . ABDOMINAL HYSTERECTOMY    . Athroscopic knee surgery     Bilateral  . CARPAL TUNNEL RELEASE Right 05/02/2016   Procedure: CARPAL TUNNEL RELEASE;  Surgeon: Meredith Pel, MD;  Location: [place];  Service: Orthopedics;  Laterality: Right;  . CHOLECYSTECTOMY    . DILATION AND CURETTAGE OF UTERUS    . EYE SURGERY Right   . SHOULDER ARTHROSCOPY WITH SUBACROMIAL DECOMPRESSION Right 05/02/2016   Procedure: RIGHT SHOULDER ARTHROSCOPY WITH SUBACROMIAL DECOMPRESSION, MINI OPEN ROTATOR CUFF TEAR REPAIR, AND RIGHT CARPAL TUNNEL RELEASE;  Surgeon: Meredith Pel, MD;  Location: [place];  Service: Orthopedics;  Laterality: Right;  . TOTAL KNEE ARTHROPLASTY Right 07/15/2012   Procedure: RIGHT TOTAL KNEE ARTHROPLASTY;  Surgeon: Lorn Junes, MD;  Location: [place];  Service: Orthopedics;  Laterality: Right;    There were no vitals filed for this visit.  Subjective Assessment - 07/26/17 1107    Subjective  I am walking more without the  cane. I am getting in the pool. Swam 5 times last week.     Currently in Pain?  Yes    Pain Score  7     Pain Location  Knee    Pain Orientation  Right;Posterior    Pain Descriptors / Indicators  -- knot    Aggravating Factors   sitting prolonged     Pain Relieving Factors  change positions          [facility] PT Assessment - 07/26/17 0001      AROM   Right Shoulder Flexion  160 Degrees    Right Shoulder ABduction  160 Degrees    Right Shoulder Internal Rotation  43 Degrees    Right Shoulder External Rotation  82 Degrees    Right Knee Extension  4    Right Knee Flexion  120      Strength   Right Shoulder Flexion  3+/5    Right Shoulder Extension  5/5    Right Shoulder ABduction  3+/5    Right Shoulder Internal Rotation  4+/5    Right Shoulder External Rotation  4+/5    Right Knee Flexion  5/5    Right Knee Extension  5/5    Left Knee Flexion  5/5  Left Knee Extension  4+/5                   [facility] Adult PT Treatment/Exercise - 07/26/17 0001      Knee/Hip Exercises: Stretches   Active Hamstring Stretch  2 reps;30 seconds long sitting      Shoulder Exercises: Standing   Extension  10 reps red    Row  10 reps red    Other Standing Exercises  Rockwood red band x 10 each      Shoulder Exercises: Stretch   Other Shoulder Stretches  quick review    Other Shoulder Stretches  quick review             PT Education - 07/26/17 1135    Education provided  Yes    Education Details  HEP    Person(s) Educated  Patient    Methods  Explanation;Handout    Comprehension  Verbalized understanding       PT Short Term Goals - 07/05/17 1234      PT SHORT TERM GOAL #1   Title  pt to be I with inital HEP    Time  3    Period  Weeks    Status  Achieved      PT SHORT TERM GOAL #2   Title  pt to verbalize and demo techniques to reduce inflammation and swelling via RICE     Time  3    Period  Weeks    Status  Achieved        PT Long Term Goals - 07/26/17  1113      PT LONG TERM GOAL #1   Title  pt to increase R knee ROM to 3 - 120 degrees for functional mobility required for functional gait    Baseline  4-120    Time  6    Period  Weeks    Status  Partially Met      PT LONG TERM GOAL #2   Title  increase bil LE strength to >/=4+/5 to promote knee stability with standing and walking activities    Baseline  Knee strength grossly 5/5 , hips never tested     Time  6    Period  Weeks    Status  Partially Met      PT LONG TERM GOAL #3   Title  pt to be able to sit, stand and walk >/=30 min with </=2/10 pain for functional endurance required for ADLs    Baseline  sitting 30 minutes, standing/walking 20 minute limit    Time  6    Period  Weeks    Status  Partially Met      PT LONG TERM GOAL #4   Title  increase FOTO score to </= 43% limited to demo improvement in function    Time  6    Period  Weeks    Status  Unable to assess      PT LONG TERM GOAL #5   Title  pt will be I with all HEP given as of last visit to maintain and progress current level of function    Time  6    Period  Weeks    Status  On-going      PT LONG TERM GOAL #6   Title  improve shoulder flexion/ abduction by >/= 10 degrees with </= 1/10 pain for functional mobility required for ADLs    Time  4    Period  Weeks  Status  Achieved      PT LONG TERM GOAL #7   Title  increase R shoulder strength to >/= 4+/5 in all planes to promote stability and proper rhythm to prevent pain or impingement     Time  4    Period  Weeks    Status  On-going            Plan - 07/26/17 1130    Clinical Impression Statement  Pt returns to PT after 3 week absence. She demonstrates improved Knee ROM and Strength. She is walking up to 20 minutes without increased pain. She can sit >30 minutes prior to onset of right knee stiffness and posterior knee pain. Her shoulder ROM has improved however she still demonstrates shoulder weakness. Established and issued shoulder strengthening  HEP without increased pain.  LTG# 1,2,3 partially met. LTG# 6 met.     PT Next Visit Plan  FOTO; check newest HEP (bands)  continue hip/ knee strengthening progressing as able. shoulder: scapular stability scapular mobs promotting upward rotation and rhythm. modalities PRN, review and update HEP PRN    PT Home Exercise Plan  quad set, heel slide, clamshells, gait training. SLS, side hip abduction, ceiling punches, upper trap stretch, rhomboid stretch , rock wood and scapular bands    Consulted and Agree with Plan of Care  Patient       Patient will benefit from skilled therapeutic intervention in order to improve the following deficits and impairments:  Abnormal gait, Pain, Decreased activity tolerance, Decreased endurance, Postural dysfunction, Improper body mechanics, Decreased range of motion, Decreased strength, Impaired UE functional use  Visit Diagnosis: Chronic pain of right knee  Chronic pain of left knee  Other abnormalities of gait and mobility  Chronic right shoulder pain  Muscle weakness (generalized)     Problem List Patient Active Problem List   Diagnosis Date Noted  . Chronic obstructive pulmonary disease ([place]) 05/30/2016  . Rotator cuff tear 05/02/2016  . Rotator cuff tendonitis, right 12/30/2015  . Cervicalgia 12/30/2015  . Eczema, dyshidrotic 11/18/2014  . Acne 11/18/2014  . Smoker 06/01/2014  . Right knee DJD   . Hyperlipidemia   . HTN (hypertension) 06/05/2012    Dorene Ar, PTA 07/26/2017, 12:10 PM  [facility] [address] [place], [place], [address] Phone: [phone]   Fax:  [phone]  Name: Alison Ford MRN: [id number] Date of Birth: 12-06-1955

## 2017-07-31 ENCOUNTER — Ambulatory Visit: Payer: Medicare Other | Admitting: Physical Therapy

## 2017-07-31 DIAGNOSIS — M6281 Muscle weakness (generalized): Secondary | ICD-10-CM

## 2017-07-31 DIAGNOSIS — R2689 Other abnormalities of gait and mobility: Secondary | ICD-10-CM

## 2017-07-31 DIAGNOSIS — M25511 Pain in right shoulder: Principal | ICD-10-CM

## 2017-07-31 DIAGNOSIS — M25561 Pain in right knee: Secondary | ICD-10-CM

## 2017-07-31 DIAGNOSIS — M25562 Pain in left knee: Secondary | ICD-10-CM | POA: Diagnosis not present

## 2017-07-31 DIAGNOSIS — G8929 Other chronic pain: Secondary | ICD-10-CM | POA: Diagnosis not present

## 2017-07-31 NOTE — Therapy (Addendum)
[place], [place], [address] Phone: [phone]   Fax:  [phone]  Physical Therapy Treatment  Patient Details  Name: Alison Ford MRN: [id number] Date of Birth: Jul 25, 1955 Referring Provider: Belenda Cruise scott dean   Encounter Date: 07/31/2017  PT End of Session - 07/31/17 1418    Visit Number  8    Number of Visits  14    Date for PT Re-Evaluation  08/02/17    Authorization Type  MCR: KX mod by 15th, Prog note by 10th    PT Start Time  1415    PT Stop Time  1500    PT Time Calculation (min)  45 min    Activity Tolerance  Patient tolerated treatment well    Behavior During Therapy  [facility] for tasks assessed/performed       Past Medical History:  Diagnosis Date  . Abnormal facial hair   . Back pain    d/t knee pain  . Headache(784.0)   . Heart murmur    slight  . HTN (hypertension)    takes Amlodipine daily and HCTZ   . Hyperlipidemia    takes Lovastatin nightly  . Joint pain   . Right knee DJD   . Weakness    left hand    Past Surgical History:  Procedure Laterality Date  . ABDOMINAL HYSTERECTOMY    . Athroscopic knee surgery     Bilateral  . CARPAL TUNNEL RELEASE Right 05/02/2016   Procedure: CARPAL TUNNEL RELEASE;  Surgeon: Meredith Pel, MD;  Location: [place];  Service: Orthopedics;  Laterality: Right;  . CHOLECYSTECTOMY    . DILATION AND CURETTAGE OF UTERUS    . EYE SURGERY Right   . SHOULDER ARTHROSCOPY WITH SUBACROMIAL DECOMPRESSION Right 05/02/2016   Procedure: RIGHT SHOULDER ARTHROSCOPY WITH SUBACROMIAL DECOMPRESSION, MINI OPEN ROTATOR CUFF TEAR REPAIR, AND RIGHT CARPAL TUNNEL RELEASE;  Surgeon: Meredith Pel, MD;  Location: [place];  Service: Orthopedics;  Laterality: Right;  . TOTAL KNEE ARTHROPLASTY Right 07/15/2012   Procedure: RIGHT TOTAL KNEE ARTHROPLASTY;  Surgeon: Lorn Junes, MD;  Location: [place];  Service: Orthopedics;  Laterality: Right;    There were no  vitals filed for this visit.  Subjective Assessment - 07/31/17 1411    Subjective  "I've been helping my friend move. I have been doing my exercises."    Currently in Pain?  Yes    Pain Score  1     Pain Location  Shoulder    Pain Orientation  Right    Pain Descriptors / Indicators  Aching "hesitant to move it"    Pain Onset  More than a month ago    Pain Score  0    Pain Location  Knee    Pain Orientation  Right;Left                       [facility] Adult PT Treatment/Exercise - 07/31/17 0001      Knee/Hip Exercises: Aerobic   Nustep  L3 x 5 min  BUE and BLE      Shoulder Exercises: Standing   Horizontal ABduction  10 reps;AROM x 3 sets. yellow band, Frequent cueing for correct technique    Extension  10 reps green    Row  10 reps red    Other Standing Exercises  Rockwood red band x 10 each pt required cueing and demonstration     Other Standing Exercises  D2  1 x 10 standing, D2 2 x 10 supine; ball rolls clockwise and counter clockwise on wall 2 x 10 ea direction yellow               PT Short Term Goals - 07/05/17 1234      PT SHORT TERM GOAL #1   Title  pt to be I with inital HEP    Time  3    Period  Weeks    Status  Achieved      PT SHORT TERM GOAL #2   Title  pt to verbalize and demo techniques to reduce inflammation and swelling via RICE     Time  3    Period  Weeks    Status  Achieved        PT Long Term Goals - 07/26/17 1113      PT LONG TERM GOAL #1   Title  pt to increase R knee ROM to 3 - 120 degrees for functional mobility required for functional gait    Baseline  4-120    Time  6    Period  Weeks    Status  Partially Met      PT LONG TERM GOAL #2   Title  increase bil LE strength to >/=4+/5 to promote knee stability with standing and walking activities    Baseline  Knee strength grossly 5/5 , hips never tested     Time  6    Period  Weeks    Status  Partially Met      PT LONG TERM GOAL #3   Title  pt to be able to sit,  stand and walk >/=30 min with </=2/10 pain for functional endurance required for ADLs    Baseline  sitting 30 minutes, standing/walking 20 minute limit    Time  6    Period  Weeks    Status  Partially Met      PT LONG TERM GOAL #4   Title  increase FOTO score to </= 43% limited to demo improvement in function    Time  6    Period  Weeks    Status  Unable to assess      PT LONG TERM GOAL #5   Title  pt will be I with all HEP given as of last visit to maintain and progress current level of function    Time  6    Period  Weeks    Status  On-going      PT LONG TERM GOAL #6   Title  improve shoulder flexion/ abduction by >/= 10 degrees with </= 1/10 pain for functional mobility required for ADLs    Time  4    Period  Weeks    Status  Achieved      PT LONG TERM GOAL #7   Title  increase R shoulder strength to >/= 4+/5 in all planes to promote stability and proper rhythm to prevent pain or impingement     Time  4    Period  Weeks    Status  On-going            Plan - 07/31/17 1507    Clinical Impression Statement  Treatment today focused on in depth review of previously assigned HEP exercises (Rockwood series) as pt did not think she was performing them correctly. Also focused on shoulder strengthening and scapular stability. Pt reports no pain at end of session.    PT Treatment/Interventions  ADLs/Self Care  Home Management;Cryotherapy;Electrical Stimulation;Iontophoresis 5m/ml Dexamethasone;Moist Heat;Dry needling;Vasopneumatic Device;Taping;Therapeutic exercise;Therapeutic activities;Manual techniques;Gait training;Stair training;Patient/family education;Ultrasound;Balance training    PT Next Visit Plan  FOTO; check newest HEP (bands)  continue hip/ knee strengthening progressing as able. shoulder: scapular stability scapular mobs promotting upward rotation and rhythm. modalities PRN, review and update HEP PRN    PT Home Exercise Plan  quad set, heel slide, clamshells, gait  training. SLS, side hip abduction, ceiling punches, upper trap stretch, rhomboid stretch , rock wood and scapular bands    Consulted and Agree with Plan of Care  Patient       Patient will benefit from skilled therapeutic intervention in order to improve the following deficits and impairments:  Abnormal gait, Pain, Decreased activity tolerance, Decreased endurance, Postural dysfunction, Improper body mechanics, Decreased range of motion, Decreased strength, Impaired UE functional use  Visit Diagnosis: Chronic right shoulder pain  Muscle weakness (generalized)  Other abnormalities of gait and mobility  Chronic pain of left knee  Chronic pain of right knee     Problem List Patient Active Problem List   Diagnosis Date Noted  . [place] 05/30/2016  . Rotator cuff tear 05/02/2016  . Rotator cuff tendonitis, right 12/30/2015  . Cervicalgia 12/30/2015  . Eczema, dyshidrotic 11/18/2014  . Acne 11/18/2014  . Smoker 06/01/2014  . Right knee DJD   . Hyperlipidemia   . HTN (hypertension) 06/05/2012   MWorthy Flank SPT 07/31/17 3:36 PM   [facility]GPrentice NAl[place] [address]Phone: [phone]  Fax:  [phone] Name: Alison INBODENMRN: [id number] of Birth: 306-24-1957

## 2017-08-02 ENCOUNTER — Ambulatory Visit: Payer: Medicare Other | Admitting: Physical Therapy

## 2017-08-02 ENCOUNTER — Encounter: Payer: Self-pay | Admitting: Physical Therapy

## 2017-08-02 DIAGNOSIS — M25561 Pain in right knee: Secondary | ICD-10-CM

## 2017-08-02 DIAGNOSIS — M25511 Pain in right shoulder: Secondary | ICD-10-CM | POA: Diagnosis not present

## 2017-08-02 DIAGNOSIS — G8929 Other chronic pain: Secondary | ICD-10-CM

## 2017-08-02 DIAGNOSIS — M6281 Muscle weakness (generalized): Secondary | ICD-10-CM | POA: Diagnosis not present

## 2017-08-02 DIAGNOSIS — R2689 Other abnormalities of gait and mobility: Secondary | ICD-10-CM

## 2017-08-02 DIAGNOSIS — M25562 Pain in left knee: Secondary | ICD-10-CM

## 2017-08-02 NOTE — Therapy (Addendum)
[place], [place], [address] Phone: [phone]   Fax:  [phone]  Physical Therapy Treatment/Re-certification  Patient Details  Name: Alison Ford MRN: [id number] Date of Birth: 1955/10/20 Referring Provider: Belenda Cruise scott dean   Encounter Date: 08/02/2017  PT End of Session - 08/02/17 1414    Visit Number  9    Number of Visits  14    Date for PT Re-Evaluation  08/16/17    Authorization Type  MCR: KX mod by 15th, Prog note by 10th    PT Start Time  1416    PT Stop Time  1500    PT Time Calculation (min)  44 min    Activity Tolerance  Patient tolerated treatment well    Behavior During Therapy  [facility] for tasks assessed/performed       Past Medical History:  Diagnosis Date  . Abnormal facial hair   . Back pain    d/t knee pain  . Headache(784.0)   . Heart murmur    slight  . HTN (hypertension)    takes Amlodipine daily and HCTZ   . Hyperlipidemia    takes Lovastatin nightly  . Joint pain   . Right knee DJD   . Weakness    left hand    Past Surgical History:  Procedure Laterality Date  . ABDOMINAL HYSTERECTOMY    . Athroscopic knee surgery     Bilateral  . CARPAL TUNNEL RELEASE Right 05/02/2016   Procedure: CARPAL TUNNEL RELEASE;  Surgeon: Meredith Pel, MD;  Location: [place];  Service: Orthopedics;  Laterality: Right;  . CHOLECYSTECTOMY    . DILATION AND CURETTAGE OF UTERUS    . EYE SURGERY Right   . SHOULDER ARTHROSCOPY WITH SUBACROMIAL DECOMPRESSION Right 05/02/2016   Procedure: RIGHT SHOULDER ARTHROSCOPY WITH SUBACROMIAL DECOMPRESSION, MINI OPEN ROTATOR CUFF TEAR REPAIR, AND RIGHT CARPAL TUNNEL RELEASE;  Surgeon: Meredith Pel, MD;  Location: [place];  Service: Orthopedics;  Laterality: Right;  . TOTAL KNEE ARTHROPLASTY Right 07/15/2012   Procedure: RIGHT TOTAL KNEE ARTHROPLASTY;  Surgeon: Lorn Junes, MD;  Location: [place];  Service: Orthopedics;  Laterality: Right;     There were no vitals filed for this visit.  Subjective Assessment - 08/02/17 1421    Subjective  "I'm feeling good today. My knee hurts a Weilbacher bit."    Currently in Pain?  No/denies    Pain Score  0-No pain    Pain Location  Shoulder    Pain Score  3    Pain Location  Knee    Pain Orientation  Right;Medial    Pain Descriptors / Indicators  Throbbing         [facility] PT Assessment - 08/02/17 0001      Observation/Other Assessments   Focus on Therapeutic Outcomes (FOTO)   23% limited      AROM   Right Shoulder Flexion  160 Degrees    Right Shoulder ABduction  160 Degrees    Right Shoulder Internal Rotation  43 Degrees    Right Shoulder External Rotation  82 Degrees    Right Knee Extension  2    Right Knee Flexion  120      Strength   Right Shoulder Flexion  3+/5    Right Shoulder Extension  5/5    Right Shoulder ABduction  3+/5    Right Shoulder Internal Rotation  4+/5    Right Shoulder External Rotation  4+/5  Right Knee Flexion  5/5    Right Knee Extension  5/5    Left Knee Flexion  5/5    Left Knee Extension  5/5                   [facility] Adult PT Treatment/Exercise - 08/02/17 0001      Knee/Hip Exercises: Aerobic   Nustep  L5 x 5 min LE only      Knee/Hip Exercises: Sidelying   Hip ABduction  10 reps;2 sets;AROM;Strengthening;Right      Shoulder Exercises: Supine   Protraction  10 reps;AROM;Strengthening;Weights cues needed for correct technique    Protraction Weight (lbs)  1    Diagonals  -- D2 with yellow band 2 x 10      Shoulder Exercises: Seated   Horizontal ABduction  10 reps;AROM;Strengthening;Both;Theraband x 2 sets    Theraband Level (Shoulder Horizontal ABduction)  Level 1 (Yellow)    Horizontal ABduction Weight (lbs)  2 x 10; frequent cueing needed to keep shoulders down. Mirror used for visual cueing              PT Education - 08/02/17 1633    Education provided  Yes    Education Details  HEP, d/c knee episode     Person(s) Educated  Patient    Methods  Explanation    Comprehension  Verbalized understanding       PT Short Term Goals - 07/05/17 1234      PT SHORT TERM GOAL #1   Title  pt to be I with inital HEP    Time  3    Period  Weeks    Status  Achieved      PT SHORT TERM GOAL #2   Title  pt to verbalize and demo techniques to reduce inflammation and swelling via RICE     Time  3    Period  Weeks    Status  Achieved        PT Long Term Goals - 08/02/17 1433      PT LONG TERM GOAL #1   Title  pt to increase R knee ROM to 3 - 120 degrees for functional mobility required for functional gait    Status  Achieved      PT LONG TERM GOAL #2   Title  increase bil LE strength to >/=4+/5 to promote knee stability with standing and walking activities    Status  Achieved      PT LONG TERM GOAL #3   Title  pt to be able to sit, stand and walk >/=30 min with </=2/10 pain for functional endurance required for ADLs    Status  Achieved      PT LONG TERM GOAL #4   Title  increase FOTO score to </= 43% limited to demo improvement in function    Baseline  23% limited - goal achieved    Status  Achieved      PT LONG TERM GOAL #5   Title  pt will be I with all HEP given as of last visit to maintain and progress current level of function    Status  On-going      PT LONG TERM GOAL #6   Title  improve shoulder flexion/ abduction by >/= 10 degrees with </= 1/10 pain for functional mobility required for ADLs    Status  Achieved      PT LONG TERM GOAL #7   Title  increase R shoulder strength  to >/= 4+/5 in all planes to promote stability and proper rhythm to prevent pain or impingement     Status  On-going            Plan - 08/02/17 1633    Clinical Impression Statement  Pt reporting some increase pain in R knee today after spending extended period of time walking around grocery store. She reports no pain while walking around the store, but some increased discomfort in R knee afterwards.  Treatment today focused on strengthening of knee, hip, and R shoulder. Reassessment performed; pt has full knee strength and has met goal for ROM. Will d/c knee episode following this session and will continue addressing shoulder in following appointments. Pt would benefit from continued OPPT services to address shoulder pain, ROM, strength, and functional mobility.     Rehab Potential  Good    PT Frequency  2x / week    PT Duration  2 weeks    PT Treatment/Interventions  ADLs/Self Care Home Management;Cryotherapy;Electrical Stimulation;Iontophoresis 51m/ml Dexamethasone;Moist Heat;Dry needling;Vasopneumatic Device;Taping;Therapeutic exercise;Therapeutic activities;Manual techniques;Gait training;Stair training;Patient/family education;Ultrasound;Balance training    PT Next Visit Plan  check newest HEP (bands) shoulder: scapular stability scapular mobs promotting upward rotation and rhythm. modalities PRN, review and update HEP PRN    PT Home Exercise Plan  ceiling punches, upper trap stretch, rhomboid stretch , rock wood and scapular bands    Consulted and Agree with Plan of Care  Patient       Patient will benefit from skilled therapeutic intervention in order to improve the following deficits and impairments:  Abnormal gait, Pain, Decreased activity tolerance, Decreased endurance, Postural dysfunction, Improper body mechanics, Decreased range of motion, Decreased strength, Impaired UE functional use  Visit Diagnosis: Chronic right shoulder pain  Muscle weakness (generalized)  Other abnormalities of gait and mobility  Chronic pain of left knee  Chronic pain of right knee     Problem List Patient Active Problem List   Diagnosis Date Noted  . Chronic obstructive pulmonary disease (HLyman 05/30/2016  . Rotator cuff tear 05/02/2016  . Rotator cuff tendonitis, right 12/30/2015  . Cervicalgia 12/30/2015  . Eczema, dyshidrotic 11/18/2014  . Acne 11/18/2014  . Smoker 06/01/2014  . Right  knee DJD   . Hyperlipidemia   . HTN (hypertension) 06/05/2012   MWorthy Flank SPT 08/02/17 5:01 PM   [facility] [address]Phone: [phone]  Fax:  [phone] Name: Alison KOTOWSKIMRN: [id number] of Birth: [birth year]

## 2017-08-07 ENCOUNTER — Ambulatory Visit: Payer: Medicare Other | Admitting: Physical Therapy

## 2017-08-07 ENCOUNTER — Encounter: Payer: Self-pay | Admitting: Physical Therapy

## 2017-08-07 DIAGNOSIS — M25511 Pain in right shoulder: Principal | ICD-10-CM

## 2017-08-07 DIAGNOSIS — M25561 Pain in right knee: Secondary | ICD-10-CM | POA: Diagnosis not present

## 2017-08-07 DIAGNOSIS — G8929 Other chronic pain: Secondary | ICD-10-CM | POA: Diagnosis not present

## 2017-08-07 DIAGNOSIS — M6281 Muscle weakness (generalized): Secondary | ICD-10-CM | POA: Diagnosis not present

## 2017-08-07 DIAGNOSIS — M25562 Pain in left knee: Secondary | ICD-10-CM | POA: Diagnosis not present

## 2017-08-07 DIAGNOSIS — R2689 Other abnormalities of gait and mobility: Secondary | ICD-10-CM | POA: Diagnosis not present

## 2017-08-07 NOTE — Therapy (Addendum)
[place], [place], [address] Phone: [phone]   Fax:  [phone]  Physical Therapy Treatment / discharge summary  Progress Note Reporting Period 5/152015  to 08/07/2017  See note below for Objective Data and Assessment of Progress/Goals.      Patient Details  Name: NIVIA GERVASE MRN: [id number] Date of Birth: [birth year] Referring Provider: Belenda Cruise scott dean   Encounter Date: 08/07/2017  PT End of Session - 08/07/17 1504    Visit Number  10    Number of Visits  14    Date for PT Re-Evaluation  08/16/17    Authorization Type  MCR: KX mod by 15th, Prog note by 10th    PT Start Time  1415    PT Stop Time  1500    PT Time Calculation (min)  45 min    Activity Tolerance  Patient tolerated treatment well    Behavior During Therapy  [facility] for tasks assessed/performed       Past Medical History:  Diagnosis Date  . Abnormal facial hair   . Back pain    d/t knee pain  . Headache(784.0)   . Heart murmur    slight  . HTN (hypertension)    takes Amlodipine daily and HCTZ   . Hyperlipidemia    takes Lovastatin nightly  . Joint pain   . Right knee DJD   . Weakness    left hand    Past Surgical History:  Procedure Laterality Date  . ABDOMINAL HYSTERECTOMY    . Athroscopic knee surgery     Bilateral  . CARPAL TUNNEL RELEASE Right 05/02/2016   Procedure: CARPAL TUNNEL RELEASE;  Surgeon: Meredith Pel, MD;  Location: [place];  Service: Orthopedics;  Laterality: Right;  . CHOLECYSTECTOMY    . DILATION AND CURETTAGE OF UTERUS    . EYE SURGERY Right   . SHOULDER ARTHROSCOPY WITH SUBACROMIAL DECOMPRESSION Right 05/02/2016   Procedure: RIGHT SHOULDER ARTHROSCOPY WITH SUBACROMIAL DECOMPRESSION, MINI OPEN ROTATOR CUFF TEAR REPAIR, AND RIGHT CARPAL TUNNEL RELEASE;  Surgeon: Meredith Pel, MD;  Location: [place];  Service: Orthopedics;  Laterality: Right;  . TOTAL KNEE ARTHROPLASTY Right 07/15/2012    Procedure: RIGHT TOTAL KNEE ARTHROPLASTY;  Surgeon: Lorn Junes, MD;  Location: [place];  Service: Orthopedics;  Laterality: Right;    There were no vitals filed for this visit.  Subjective Assessment - 08/07/17 1420    Subjective  "I feel okay. It isn't throbbing like it used to. I don't have any pain."    Currently in Pain?  No/denies    Pain Score  0-No pain    Pain Relieving Factors  asper cream                       [facility] Adult PT Treatment/Exercise - 08/07/17 0001      Exercises   Exercises  Neck      Shoulder Exercises: Supine   Diagonals  AROM;Strengthening;Right;10 reps x 1 set. D2 with yellow band 2 x 10      Shoulder Exercises: Seated   Horizontal ABduction  10 reps;AROM;Strengthening;Both;Theraband x 1 set    Theraband Level (Shoulder Horizontal ABduction)  Level 1 (Yellow)    External Rotation  AROM;Strengthening;Both;10 reps x 2 yellow band      Shoulder Exercises: Prone   Other Prone Exercises  ITYs on R with thumb forward 2 x 10 ea Cues needed for correct technique  Shoulder Exercises: Standing   Horizontal ABduction  10 reps;AROM x 1 set. yellow band, mirror used for visual cueing    Other Standing Exercises  lower trap lift offs on wall 2 x 10; wall angels 2 x 8-10 reps    Other Standing Exercises  ball roll on wall clockwise and counterclockwise x 10 ea      Shoulder Exercises: Stretch   Other Shoulder Stretches  R upper trap and levator stretch 2 x 30 sec ea      Manual Therapy   Manual Therapy  Soft tissue mobilization    Soft tissue mobilization  MTPr R upper trap             PT Education - 08/07/17 1503    Education provided  Yes    Education Details  HEP update and review    Person(s) Educated  Patient    Methods  Explanation;Handout    Comprehension  Verbalized understanding       PT Short Term Goals - 07/05/17 1234      PT SHORT TERM GOAL #1   Title  pt to be I with inital HEP    Time  3    Period  Weeks     Status  Achieved      PT SHORT TERM GOAL #2   Title  pt to verbalize and demo techniques to reduce inflammation and swelling via RICE     Time  3    Period  Weeks    Status  Achieved        PT Long Term Goals - 08/02/17 1433      PT LONG TERM GOAL #1   Title  pt to increase R knee ROM to 3 - 120 degrees for functional mobility required for functional gait    Status  Achieved      PT LONG TERM GOAL #2   Title  increase bil LE strength to >/=4+/5 to promote knee stability with standing and walking activities    Status  Achieved      PT LONG TERM GOAL #3   Title  pt to be able to sit, stand and walk >/=30 min with </=2/10 pain for functional endurance required for ADLs    Status  Achieved      PT LONG TERM GOAL #4   Title  increase FOTO score to </= 43% limited to demo improvement in function    Baseline  23% limited - goal achieved    Status  Achieved      PT LONG TERM GOAL #5   Title  pt will be I with all HEP given as of last visit to maintain and progress current level of function    Status  On-going      PT LONG TERM GOAL #6   Title  improve shoulder flexion/ abduction by >/= 10 degrees with </= 1/10 pain for functional mobility required for ADLs    Status  Achieved      PT LONG TERM GOAL #7   Title  increase R shoulder strength to >/= 4+/5 in all planes to promote stability and proper rhythm to prevent pain or impingement     Status  On-going            Plan - 08/07/17 1505    Clinical Impression Statement  Treatment focused continued scapular stabilization and shoulder strengthening. Progressed to shoulder strengthening in prone. Pt continues to hike R shoulder during exercises and requires verbal and  tactile cueing for correction.     PT Treatment/Interventions  ADLs/Self Care Home Management;Cryotherapy;Electrical Stimulation;Iontophoresis 5m/ml Dexamethasone;Moist Heat;Dry needling;Vasopneumatic Device;Taping;Therapeutic exercise;Therapeutic  activities;Manual techniques;Gait training;Stair training;Patient/family education;Ultrasound;Balance training    PT Next Visit Plan  check newest HEP (ITYs) scapular stability, scapular mobs promotting upward rotation and rhythm. modalities PRN, MTPr R upper trap prn    PT Home Exercise Plan  ceiling punches, upper trap stretch, rhomboid stretch , rock wood and scapular bands, prone ITYs    Consulted and Agree with Plan of Care  Patient       Patient will benefit from skilled therapeutic intervention in order to improve the following deficits and impairments:  Abnormal gait, Pain, Decreased activity tolerance, Decreased endurance, Postural dysfunction, Improper body mechanics, Decreased range of motion, Decreased strength, Impaired UE functional use  Visit Diagnosis: Chronic right shoulder pain  Muscle weakness (generalized)  Other abnormalities of gait and mobility     Problem List Patient Active Problem List   Diagnosis Date Noted  . Chronic obstructive pulmonary disease (HMaili 05/30/2016  . Rotator cuff tear 05/02/2016  . Rotator cuff tendonitis, right 12/30/2015  . Cervicalgia 12/30/2015  . Eczema, dyshidrotic 11/18/2014  . Acne 11/18/2014  . Smoker 06/01/2014  . Right knee DJD   . Hyperlipidemia   . HTN (hypertension) 06/05/2012   MWorthy Flank SPT 08/07/17 3:20 PM   [facility] [place] [address]Phone: [phone]  Fax:  [phone] Name: BSARAFINA PUTHOFFMRN: [id number] of Birth: 307/10/1955         PHYSICAL THERAPY DISCHARGE SUMMARY  Visits from Start of Care: 10  Current functional level related to goals / functional outcomes: See goals   Remaining deficits: unknown   Education / Equipment: HEP  Plan: Patient agrees to discharge.  Patient goals were partially met. Patient is being discharged due to not returning since the last visit.  ?????           Kristoffer Leamon PT, DPT, LAT, ATC  08/29/17  2:33 PM

## 2017-08-09 ENCOUNTER — Encounter: Payer: Medicare Other | Admitting: Physical Therapy

## 2017-08-14 ENCOUNTER — Ambulatory Visit: Payer: Medicare Other | Admitting: Physical Therapy

## 2017-08-16 ENCOUNTER — Telehealth: Payer: Self-pay | Admitting: Physical Therapy

## 2017-08-16 ENCOUNTER — Ambulatory Visit: Payer: Medicare Other | Attending: Orthopedic Surgery | Admitting: Physical Therapy

## 2017-08-16 NOTE — Telephone Encounter (Signed)
Called patient and left voicemail reminding her of missed appointment today at 11:45. Reminded pt of attendance policy and that 3 no-shows are grounds for discharge. Today was her last scheduled appointment, therefore instructed pt to call to schedule more appointments or call and let us know she no longer wants to continue with PT.

## 2017-09-19 ENCOUNTER — Ambulatory Visit (INDEPENDENT_AMBULATORY_CARE_PROVIDER_SITE_OTHER): Payer: Medicare Other | Admitting: Orthopedic Surgery

## 2017-10-29 MED FILL — VENTOLIN HFA 90 MCG INHALER: 108 (90 BAS | 25 days supply | Qty: 18 | Fill #1

## 2017-10-29 MED FILL — CROMOLYN 4% EYE DROPS: 4 | 18 days supply | Qty: 10 | Fill #3

## 2017-10-29 MED FILL — SYMBICORT 80-4.5 MCG INH: 80-4.5 | 30 days supply | Qty: 10 | Fill #2

## 2017-10-31 ENCOUNTER — Ambulatory Visit: Payer: Medicare Other | Attending: Internal Medicine | Admitting: Physician Assistant

## 2017-10-31 VITALS — BP 112/64 | HR 85 | Temp 97.7°F | Wt 182.6 lb

## 2017-10-31 DIAGNOSIS — Z7951 Long term (current) use of inhaled steroids: Secondary | ICD-10-CM | POA: Diagnosis not present

## 2017-10-31 DIAGNOSIS — Z7982 Long term (current) use of aspirin: Secondary | ICD-10-CM | POA: Diagnosis not present

## 2017-10-31 DIAGNOSIS — R05 Cough: Secondary | ICD-10-CM | POA: Diagnosis present

## 2017-10-31 DIAGNOSIS — J069 Acute upper respiratory infection, unspecified: Secondary | ICD-10-CM | POA: Diagnosis not present

## 2017-10-31 DIAGNOSIS — E785 Hyperlipidemia, unspecified: Secondary | ICD-10-CM | POA: Diagnosis not present

## 2017-10-31 DIAGNOSIS — F17200 Nicotine dependence, unspecified, uncomplicated: Secondary | ICD-10-CM | POA: Insufficient documentation

## 2017-10-31 DIAGNOSIS — Z79899 Other long term (current) drug therapy: Secondary | ICD-10-CM | POA: Insufficient documentation

## 2017-10-31 DIAGNOSIS — Z880 Allergy status to penicillin: Secondary | ICD-10-CM | POA: Diagnosis not present

## 2017-10-31 DIAGNOSIS — I10 Essential (primary) hypertension: Secondary | ICD-10-CM | POA: Diagnosis not present

## 2017-10-31 MED ORDER — BENZONATATE 100 MG PO CAPS: 200.0000 mg | ORAL_CAPSULE | Freq: Three times a day (TID) | ORAL | 0 refills | Status: DC | PRN

## 2017-10-31 MED ORDER — CLOTRIMAZOLE-BETAMETHASONE 1-0.05 % EX CREA: 1.0000 "application " | TOPICAL_CREAM | Freq: Two times a day (BID) | CUTANEOUS | 1 refills | Status: DC

## 2017-10-31 MED ORDER — AZITHROMYCIN 250 MG PO TABS: ORAL_TABLET | ORAL | 0 refills | Status: DC

## 2017-10-31 NOTE — Progress Notes (Signed)
Patient ID: LETANYA FROH, female   DOB: 09-29-1955, 62 y.o.   MRN: [id number]   Brexlee Heberlein, is a 62 y.o. female  VFI:[id number]  CZY:[id number]  DOB - [birth year]  Subjective:  Chief Complaint and HPI: Percilla Tweten is a 62 y.o. female here today for 2 week h/o cough.  Some yellow phlegm.  Cough is harsh.  Worse at night.  + h/o smoking.  Thinks she may have had fever the first week.  No travel.  No hemoptysis.    ROS:   Constitutional:  No f/c, No night sweats, No unexplained weight loss. EENT:  No vision changes, No blurry vision, No hearing changes. No mouth, throat, or ear problems.  Respiratory: + cough, No SOB Cardiac: No CP, no palpitations GI:  No abd pain, No N/V/D. GU: No Urinary s/sx Musculoskeletal: No joint pain Neuro: No headache, no dizziness, no motor weakness.  Skin: No rash Endocrine:  No polydipsia. No polyuria.  Psych: Denies SI/HI  No problems updated.  ALLERGIES: Allergies  Allergen Reactions  . Penicillins Itching and Other (See Comments)    Burning and whelps Has patient had a PCN reaction causing immediate rash, facial/tongue/throat swelling, SOB or lightheadedness with hypotension: NO Has patient had a PCN reaction causing severe rash involving mucus membranes or skin necrosis: NO Has patient had a PCN reaction that required hospitalization: NO Has patient had a PCN reaction occurring within the last 10 years: NO If all of the above answers are "NO", then may proceed with Cephalosporin use.    PAST MEDICAL HISTORY: Past Medical History:  Diagnosis Date  . Abnormal facial hair   . Back pain    d/t knee pain  . Headache(784.0)   . Heart murmur    slight  . HTN (hypertension)    takes Amlodipine daily and HCTZ   . Hyperlipidemia    takes Lovastatin nightly  . Joint pain   . Right knee DJD   . Weakness    left hand    MEDICATIONS AT HOME: Prior to Admission medications   Medication Sig Start Date End Date Taking? Authorizing  Provider  albuterol (PROVENTIL) (2.5 MG/3ML) 0.083% nebulizer solution Take 3 mLs (2.5 mg total) by nebulization every 6 (six) hours as needed for wheezing or shortness of breath. 01/19/17  Yes Ladell Pier, MD  amitriptyline (ELAVIL) 25 MG tablet Take 1 tablet (25 mg total) by mouth at bedtime as needed for sleep. 05/30/16  Yes Langeland, Dawn T, MD  amLODipine (NORVASC) 10 MG tablet Take 1 tablet (10 mg total) by mouth daily. 05/28/17  Yes Ladell Pier, MD  aspirin EC 81 MG tablet Take 1 tablet (81 mg total) by mouth daily. 07/12/17  Yes Ladell Pier, MD  budesonide-formoterol (SYMBICORT) 80-4.5 MCG/ACT inhaler Inhale 2 puffs into the lungs 2 (two) times daily. 04/04/17  Yes Ladell Pier, MD  lisinopril-hydrochlorothiazide (PRINZIDE,ZESTORETIC) 20-25 MG tablet Take 1 tablet by mouth daily. 05/28/17  Yes Ladell Pier, MD  lovastatin (MEVACOR) 40 MG tablet TAKE 1 TABLET AT BEDTIME BY MOUTH. 07/12/17  Yes Ladell Pier, MD  polyethylene glycol powder (GLYCOLAX/MIRALAX) powder Take 17 g by mouth 2 (two) times daily as needed. 07/12/17  Yes Ladell Pier, MD  tiZANidine (ZANAFLEX) 4 MG tablet Take 1 tablet (4 mg total) by mouth every 6 (six) hours as needed for muscle spasms. 05/05/16  Yes Newt Minion, MD  traZODone (DESYREL) 50 MG tablet Take 0.5-1 tablets (25-50 mg total) by  mouth at bedtime as needed for sleep. 01/19/17  Yes Ladell Pier, MD  VENTOLIN HFA 108 (90 Base) MCG/ACT inhaler INHALE 2 PUFFS EVERY 6 HOURS AS NEEDED INTO THE LUNGS FOR WHEEZING OR SHORTNESS OF BREATH. 04/17/17  Yes Ladell Pier, MD  azithromycin (ZITHROMAX) 250 MG tablet Take 2 today then 1 daily 10/31/17   Argentina Donovan, PA-C  benzonatate (TESSALON) 100 MG capsule Take 2 capsules (200 mg total) by mouth 3 (three) times daily as needed for cough. 10/31/17   Argentina Donovan, PA-C  clotrimazole-betamethasone (LOTRISONE) cream Apply 1 application topically 2 (two) times daily. 10/31/17    Argentina Donovan, PA-C  nicotine (NICODERM CQ - DOSED IN MG/24 HOURS) 21 mg/24hr patch Place 1 patch (21 mg total) onto the skin daily. Patient not taking: Reported on 05/30/2017 01/19/17   Ladell Pier, MD     Objective:  EXAM:   Vitals:   10/31/17 1455  BP: 112/64  Pulse: 85  Temp: 97.7 F (36.5 C)  TempSrc: Oral  SpO2: 97%  Weight: 182 lb 9.6 oz (82.8 kg)    General appearance : A&OX3. NAD. Non-toxic-appearing HEENT: Atraumatic and Normocephalic.  PERRLA. EOM intact.  TM clear B. Mouth-MMM, post pharynx WNL w/o erythema, No PND. Neck: supple, no JVD. No cervical lymphadenopathy. No thyromegaly Chest/Lungs:  Breathing-non-labored, Good air entry bilaterally, breath sounds normal without rales, rhonchi, or wheezing  CVS: S1 S2 regular, no murmurs, gallops, rubs  Extremities: Bilateral Lower Ext shows no edema, both legs are warm to touch with = pulse throughout Neurology:  CN II-XII grossly intact, Non focal.   Psych:  TP linear. J/I WNL. Normal speech. Appropriate eye contact and affect.  Skin:  No Rash  Data Review Lab Results  Component Value Date   HGBA1C 5.5 09/29/2015   HGBA1C 6.2 (H) 03/17/2014     Assessment & Plan   1. Upper respiratory tract infection, unspecified type Cover for atypicals.   - azithromycin (ZITHROMAX) 250 MG tablet; Take 2 today then 1 daily  Dispense: 6 tablet; Refill: 0 - benzonatate (TESSALON) 100 MG capsule; Take 2 capsules (200 mg total) by mouth 3 (three) times daily as needed for cough.  Dispense: 40 capsule; Refill: 0  2. Essential hypertension Controlled.  Continue current regimen     Patient have been counseled extensively about nutrition and exercise  Return in about 2 months (around 12/31/2017) for Dr Rosie Fate.  The patient was given clear instructions to go to ER or return to medical center if symptoms don't improve, worsen or new problems develop. The patient verbalized understanding. The patient was  told to call to get lab results if they haven't heard anything in the next week.     Freeman Caldron, PA-C [facility] and [place], [place]   10/31/2017, 3:10 PM

## 2017-11-01 ENCOUNTER — Telehealth: Payer: Self-pay | Admitting: Internal Medicine

## 2017-11-01 NOTE — Telephone Encounter (Signed)
Will forward to angela 

## 2017-11-01 NOTE — Telephone Encounter (Signed)
Patient called wanting to get an alternative for her cough medicine (benzonatate 100 mg that she was prescribed because medicaid does not cover it. Please follow up with patient. She would like it sent to [facility] on [address].

## 2017-11-02 ENCOUNTER — Other Ambulatory Visit: Payer: Self-pay | Admitting: Physician Assistant

## 2017-11-02 MED ORDER — PROMETHAZINE-DM 6.25-15 MG/5ML PO SYRP: ORAL_SOLUTION | ORAL | 0 refills | Status: DC

## 2017-11-02 NOTE — Telephone Encounter (Signed)
I sent her a different cough medicine.  Please let her know! Thanks, Freeman Caldron, PA-C

## 2017-11-29 ENCOUNTER — Other Ambulatory Visit: Payer: Self-pay | Admitting: Internal Medicine

## 2017-12-04 DIAGNOSIS — H04123 Dry eye syndrome of bilateral lacrimal glands: Secondary | ICD-10-CM | POA: Diagnosis not present

## 2017-12-04 DIAGNOSIS — H2513 Age-related nuclear cataract, bilateral: Secondary | ICD-10-CM | POA: Diagnosis not present

## 2017-12-04 DIAGNOSIS — H10413 Chronic giant papillary conjunctivitis, bilateral: Secondary | ICD-10-CM | POA: Diagnosis not present

## 2017-12-04 DIAGNOSIS — H524 Presbyopia: Secondary | ICD-10-CM | POA: Diagnosis not present

## 2017-12-04 DIAGNOSIS — H40013 Open angle with borderline findings, low risk, bilateral: Secondary | ICD-10-CM | POA: Diagnosis not present

## 2018-01-19 DIAGNOSIS — J45991 Cough variant asthma: Secondary | ICD-10-CM | POA: Diagnosis not present

## 2018-02-12 ENCOUNTER — Other Ambulatory Visit: Payer: Self-pay | Admitting: Internal Medicine

## 2018-02-12 ENCOUNTER — Other Ambulatory Visit: Payer: Self-pay | Admitting: Physician Assistant

## 2018-02-19 DIAGNOSIS — J45991 Cough variant asthma: Secondary | ICD-10-CM | POA: Diagnosis not present

## 2018-03-12 ENCOUNTER — Other Ambulatory Visit: Payer: Self-pay | Admitting: Internal Medicine

## 2018-03-20 DIAGNOSIS — J45991 Cough variant asthma: Secondary | ICD-10-CM | POA: Diagnosis not present

## 2018-04-01 ENCOUNTER — Other Ambulatory Visit: Payer: Self-pay

## 2018-04-01 ENCOUNTER — Ambulatory Visit: Payer: Medicare HMO | Attending: Family Medicine | Admitting: Family Medicine

## 2018-04-01 ENCOUNTER — Encounter: Payer: Self-pay | Admitting: Family Medicine

## 2018-04-01 VITALS — BP 125/73 | HR 69 | Temp 98.3°F | Wt 181.0 lb

## 2018-04-01 DIAGNOSIS — J069 Acute upper respiratory infection, unspecified: Secondary | ICD-10-CM

## 2018-04-01 DIAGNOSIS — R05 Cough: Secondary | ICD-10-CM | POA: Diagnosis not present

## 2018-04-01 DIAGNOSIS — R059 Cough, unspecified: Secondary | ICD-10-CM

## 2018-04-01 DIAGNOSIS — B353 Tinea pedis: Secondary | ICD-10-CM

## 2018-04-01 MED ORDER — CLOTRIMAZOLE-BETAMETHASONE 1-0.05 % EX CREA: 1.0000 "application " | TOPICAL_CREAM | Freq: Two times a day (BID) | CUTANEOUS | 1 refills | Status: DC

## 2018-04-01 MED ORDER — PROMETHAZINE-DM 6.25-15 MG/5ML PO SYRP: ORAL_SOLUTION | ORAL | 0 refills | Status: DC

## 2018-04-01 MED ORDER — BENZONATATE 200 MG PO CAPS: 200.0000 mg | ORAL_CAPSULE | Freq: Three times a day (TID) | ORAL | 0 refills | Status: DC | PRN

## 2018-04-01 MED ORDER — CETIRIZINE HCL 10 MG PO TABS: 10.0000 mg | ORAL_TABLET | Freq: Every day | ORAL | 1 refills | Status: DC

## 2018-04-01 NOTE — Progress Notes (Signed)
Subjective:  Patient ID: Alison Ford, female    DOB: 07/10/1955  Age: 63 y.o. MRN: [id number]  CC: Cough   HPI Alison Ford is a 63 year old female with history of hypertension, COPD who presents today with complaints of cough, chills, sweats, rhinorrhea. 5 months ago she was seen for a URI by the PA and states symptoms improved shortly after but last her symptoms started again.  Cough is worse when she lies down and is associated with production of whitish to yellowish phlegm.  Rhinorrhea is clear and she denies sinus pressure or pain, fever, body aches, nausea, vomiting or diarrhea.  Denies wheezing, dyspnea. Cough is associated with musculoskeletal chest pain which is absent when she is not coughing.  Chills are nonspecific in occur sometimes after she coughs and at other times after she takes a drink.  Requests a refill of her antitussive, Tessalon Perles which were helpful previously. Denies history of sick contacts  Past Medical History:  Diagnosis Date  . Abnormal facial hair   . Back pain    d/t knee pain  . Headache(784.0)   . Heart murmur    slight  . HTN (hypertension)    takes Amlodipine daily and HCTZ   . Hyperlipidemia    takes Lovastatin nightly  . Joint pain   . Right knee DJD   . Weakness    left hand    Past Surgical History:  Procedure Laterality Date  . ABDOMINAL HYSTERECTOMY    . Athroscopic knee surgery     Bilateral  . CARPAL TUNNEL RELEASE Right 05/02/2016   Procedure: CARPAL TUNNEL RELEASE;  Surgeon: Meredith Pel, MD;  Location: [facility];  Service: Orthopedics;  Laterality: Right;  . CHOLECYSTECTOMY    . DILATION AND CURETTAGE OF UTERUS    . EYE SURGERY Right   . SHOULDER ARTHROSCOPY WITH SUBACROMIAL DECOMPRESSION Right 05/02/2016   Procedure: RIGHT SHOULDER ARTHROSCOPY WITH SUBACROMIAL DECOMPRESSION, MINI OPEN ROTATOR CUFF TEAR REPAIR, AND RIGHT CARPAL TUNNEL RELEASE;  Surgeon: Meredith Pel, MD;  Location: [facility];  Service:  Orthopedics;  Laterality: Right;  . TOTAL KNEE ARTHROPLASTY Right 07/15/2012   Procedure: RIGHT TOTAL KNEE ARTHROPLASTY;  Surgeon: Lorn Junes, MD;  Location: [facility];  Service: Orthopedics;  Laterality: Right;    Family History  Problem Relation Age of Onset  . CAD Father 79  . CAD Mother 26  . Hypertension Mother     Allergies  Allergen Reactions  . Penicillins Itching and Other (See Comments)    Burning and whelps Has patient had a PCN reaction causing immediate rash, facial/tongue/throat swelling, SOB or lightheadedness with hypotension: NO Has patient had a PCN reaction causing severe rash involving mucus membranes or skin necrosis: NO Has patient had a PCN reaction that required hospitalization: NO Has patient had a PCN reaction occurring within the last 10 years: NO If all of the above answers are "NO", then may proceed with Cephalosporin use.    Outpatient Medications Prior to Visit  Medication Sig Dispense Refill  . albuterol (PROVENTIL HFA;VENTOLIN HFA) 108 (90 Base) MCG/ACT inhaler INHALE 2 PUFFS INTO LUNGS EVERY 6 HOURS AS NEEDED FOR WHEEZING OR SHORTNESS OF BREATH 18 g 0  . albuterol (PROVENTIL) (2.5 MG/3ML) 0.083% nebulizer solution Take 3 mLs (2.5 mg total) by nebulization every 6 (six) hours as needed for wheezing or shortness of breath. 150 mL 1  . amLODipine (NORVASC) 10 MG tablet Take 1 tablet (10 mg total) by mouth daily. [place]  tablet 1  . aspirin EC 81 MG tablet Take 1 tablet (81 mg total) by mouth daily. 90 tablet 3  . budesonide-formoterol (SYMBICORT) 80-4.5 MCG/ACT inhaler Inhale 2 puffs into the lungs 2 (two) times daily. 1 Inhaler 3  . cromolyn (OPTICROM) 4 % ophthalmic solution Place 1 drop into both eyes 4 (four) times daily.  98  . lisinopril-hydrochlorothiazide (PRINZIDE,ZESTORETIC) 20-25 MG tablet Take 1 tablet by mouth daily. 90 tablet 3  . benzonatate (TESSALON) 100 MG capsule Take 2 capsules (200 mg total) by mouth 3 (three) times daily as needed for  cough. 40 capsule 0  . clotrimazole-betamethasone (LOTRISONE) cream Apply 1 application topically 2 (two) times daily. 30 g 1  . promethazine-dextromethorphan (PROMETHAZINE-DM) 6.25-15 MG/5ML syrup 2.5-60ml every 4-6 hours prn cough 118 mL 0  . amitriptyline (ELAVIL) 25 MG tablet Take 1 tablet (25 mg total) by mouth at bedtime as needed for sleep. (Patient not taking: Reported on 04/01/2018) 30 tablet 1  . azithromycin (ZITHROMAX) 250 MG tablet Take 2 today then 1 daily (Patient not taking: Reported on 04/01/2018) 6 tablet 0  . lovastatin (MEVACOR) 40 MG tablet TAKE 1 TABLET BY MOUTH AT BEDTIME. MUST MAKE APPT FOR FURTHER REFILLS (Patient not taking: Reported on 04/01/2018) 30 tablet 0  . nicotine (NICODERM CQ - DOSED IN MG/24 HOURS) 21 mg/24hr patch Place 1 patch (21 mg total) onto the skin daily. (Patient not taking: Reported on 05/30/2017) 28 patch 0  . polyethylene glycol powder (GLYCOLAX/MIRALAX) powder Take 17 g by mouth 2 (two) times daily as needed. (Patient not taking: Reported on 04/01/2018) 3350 g 1  . tiZANidine (ZANAFLEX) 4 MG tablet Take 1 tablet (4 mg total) by mouth every 6 (six) hours as needed for muscle spasms. (Patient not taking: Reported on 04/01/2018) 30 tablet 0  . traZODone (DESYREL) 50 MG tablet Take 0.5-1 tablets (25-50 mg total) by mouth at bedtime as needed for sleep. (Patient not taking: Reported on 04/01/2018) 30 tablet 3   No facility-administered medications prior to visit.      ROS Review of Systems  Constitutional: Negative for activity change, appetite change and fatigue.  HENT: Positive for postnasal drip. Negative for congestion, sinus pressure and sore throat.   Eyes: Negative for visual disturbance.  Respiratory: Positive for cough. Negative for chest tightness, shortness of breath and wheezing.   Cardiovascular: Negative for chest pain and palpitations.  Gastrointestinal: Negative for abdominal distention, abdominal pain and constipation.  Endocrine: Negative  for polydipsia.  Genitourinary: Negative for dysuria and frequency.  Musculoskeletal: Negative for arthralgias and back pain.  Skin: Negative for rash.  Neurological: Negative for tremors, light-headedness and numbness.  Hematological: Does not bruise/bleed easily.  Psychiatric/Behavioral: Negative for agitation and behavioral problems.    Objective:  BP 125/73   Pulse 69   Temp 98.3 F (36.8 C) (Oral)   Wt 181 lb (82.1 kg)   SpO2 98%   BMI 27.93 kg/m   BP/Weight 04/01/2018 76/22/6333 05/20/5623  Systolic BP 638 937 342  Diastolic BP 73 64 72  Wt. (Lbs) 181 182.6 192  BMI 27.93 28.18 29.63      Physical Exam Constitutional:      Appearance: She is well-developed.  HENT:     Right Ear: Tympanic membrane normal.     Left Ear: Tympanic membrane normal.     Mouth/Throat:     Pharynx: Oropharynx is clear.  Cardiovascular:     Rate and Rhythm: Normal rate.     Heart sounds: Normal  heart sounds. No murmur.  Pulmonary:     Effort: Pulmonary effort is normal.     Breath sounds: Normal breath sounds. No wheezing or rales.  Chest:     Chest wall: No tenderness.  Abdominal:     General: Bowel sounds are normal. There is no distension.     Palpations: Abdomen is soft. There is no mass.     Tenderness: There is no abdominal tenderness.  Musculoskeletal: Normal range of motion.  Neurological:     Mental Status: She is alert and oriented to person, place, and time.     CMP Latest Ref Rng & Units 04/19/2017 04/27/2016 09/29/2015  Glucose 65 - 99 mg/dL 120(H) 91 87  BUN 8 - 27 mg/dL 14 12 9   Creatinine 0.57 - 1.00 mg/dL 0.79 0.64 0.85  Sodium 134 - 144 mmol/L 143 139 139  Potassium 3.5 - 5.2 mmol/L 4.3 4.1 4.0  Chloride 96 - 106 mmol/L 102 108 101  CO2 20 - 29 mmol/L 26 24 25   Calcium 8.7 - 10.3 mg/dL 10.4(H) 9.3 9.4  Total Protein 6.0 - 8.5 g/dL 6.6 - -  Total Bilirubin 0.0 - 1.2 mg/dL <0.2 - -  Alkaline Phos 39 - 117 IU/L 71 - -  AST 0 - 40 IU/L 18 - -  ALT 0 - 32 IU/L 14  - -    Lipid Panel     Component Value Date/Time   CHOL 195 04/19/2017 1547   TRIG 184 (H) 04/19/2017 1547   HDL 49 04/19/2017 1547   CHOLHDL 4.0 04/19/2017 1547   CHOLHDL 4.0 03/17/2014 0922   VLDL 41 (H) 03/17/2014 0922   LDLCALC 109 (H) 04/19/2017 1547    CBC    Component Value Date/Time   WBC 7.0 05/07/2017 1422   RBC 4.03 05/07/2017 1422   HGB 12.3 05/07/2017 1422   HGB 12.3 04/19/2017 1547   HCT 35.8 05/07/2017 1422   HCT 36.7 04/19/2017 1547   PLT 295 05/07/2017 1422   PLT 277 04/19/2017 1547   MCV 88.8 05/07/2017 1422   MCV 92 04/19/2017 1547   MCH 30.5 05/07/2017 1422   MCHC 34.4 05/07/2017 1422   RDW 12.6 05/07/2017 1422   RDW 13.5 04/19/2017 1547   LYMPHSABS 3,171 05/07/2017 1422   MONOABS 448 09/29/2015 1158   EOSABS 273 05/07/2017 1422   BASOSABS 42 05/07/2017 1422    Lab Results  Component Value Date   HGBA1C 5.5 09/29/2015    Assessment & Plan:   1. Cough -Postnasal drip - cetirizine (ZYRTEC) 10 MG tablet; Take 1 tablet (10 mg total) by mouth daily.  Dispense: 30 tablet; Refill: 1 - promethazine-dextromethorphan (PROMETHAZINE-DM) 6.25-15 MG/5ML syrup; 2.5-60ml every 4-6 hours prn cough  Dispense: 118 mL; Refill: 0 - benzonatate (TESSALON) 200 MG capsule; Take 1 capsule (200 mg total) by mouth 3 (three) times daily as needed for cough.  Dispense: 21 capsule; Refill: 0  2. Upper respiratory tract infection, unspecified type - benzonatate (TESSALON) 200 MG capsule; Take 1 capsule (200 mg total) by mouth 3 (three) times daily as needed for cough.  Dispense: 21 capsule; Refill: 0  3. Tinea pedis of right foot - clotrimazole-betamethasone (LOTRISONE) cream; Apply 1 application topically 2 (two) times daily.  Dispense: 30 g; Refill: 1   Meds ordered this encounter  Medications  . cetirizine (ZYRTEC) 10 MG tablet    Sig: Take 1 tablet (10 mg total) by mouth daily.    Dispense:  30 tablet  Refill:  1  . promethazine-dextromethorphan  (PROMETHAZINE-DM) 6.25-15 MG/5ML syrup    Sig: 2.5-44ml every 4-6 hours prn cough    Dispense:  118 mL    Refill:  0  . benzonatate (TESSALON) 200 MG capsule    Sig: Take 1 capsule (200 mg total) by mouth 3 (three) times daily as needed for cough.    Dispense:  21 capsule    Refill:  0  . clotrimazole-betamethasone (LOTRISONE) cream    Sig: Apply 1 application topically 2 (two) times daily.    Dispense:  30 g    Refill:  1    Follow-up: Return in about 1 month (around 05/02/2018) for for chronic medical conditions with PCP.       Charlott Rakes, MD, FAAFP. [facility], [place]   04/01/2018, 9:31 AM

## 2018-04-03 ENCOUNTER — Telehealth: Payer: Self-pay | Admitting: Internal Medicine

## 2018-04-03 NOTE — Telephone Encounter (Signed)
Please call this patient about a piror authorization on a med

## 2018-04-05 NOTE — Telephone Encounter (Signed)
Called pt lvm .

## 2018-04-05 NOTE — Telephone Encounter (Signed)
There are no prior authorizations that need to be completed for this patient. The benzonatate is a cough suppressant and no drugs in that class are covered by medicare or medicaid. She can use [employer] for a discount on the medication but she will have to pay out of pocket.

## 2018-04-08 NOTE — Telephone Encounter (Signed)
Patients call taken.  Patient identified by name and date of birth. Patient was unable to get prescription for Tessalon capsules as it was declined by her secondary carrier [employer].    Patient want Doctor to authorize this medication so it can be covered by insurance.  Her insurance company told her to call her PCP and "get it authorized."

## 2018-04-08 NOTE — Telephone Encounter (Signed)
Patient called requesting to speak with the nurse regarding her medication. Patient does not want to speak with someone at the front desk.

## 2018-04-08 NOTE — Telephone Encounter (Signed)
Patients call returned.  Patient identified by name and date of birth. Patient explained that Robitussin would work just as well as Mining [employer] and that she get it over the counter

## 2018-04-12 ENCOUNTER — Other Ambulatory Visit: Payer: Self-pay | Admitting: Internal Medicine

## 2018-04-20 DIAGNOSIS — J45991 Cough variant asthma: Secondary | ICD-10-CM | POA: Diagnosis not present

## 2018-04-25 ENCOUNTER — Other Ambulatory Visit: Payer: Self-pay | Admitting: Internal Medicine

## 2018-04-25 DIAGNOSIS — I10 Essential (primary) hypertension: Secondary | ICD-10-CM

## 2018-05-10 ENCOUNTER — Other Ambulatory Visit: Payer: Self-pay | Admitting: Pharmacist

## 2018-05-10 MED ORDER — LOVASTATIN 40 MG PO TABS: ORAL_TABLET | ORAL | 0 refills | Status: DC

## 2018-05-13 ENCOUNTER — Other Ambulatory Visit (INDEPENDENT_AMBULATORY_CARE_PROVIDER_SITE_OTHER): Payer: Self-pay | Admitting: Orthopedic Surgery

## 2018-05-13 ENCOUNTER — Other Ambulatory Visit: Payer: Self-pay | Admitting: Internal Medicine

## 2018-05-13 NOTE — Telephone Encounter (Signed)
Ok to rf? 

## 2018-05-14 NOTE — Telephone Encounter (Signed)
No refill, we need to see her if she is having problems

## 2018-05-27 ENCOUNTER — Ambulatory Visit: Payer: Medicare HMO | Admitting: Orthopedic Surgery

## 2018-07-05 ENCOUNTER — Other Ambulatory Visit: Payer: Self-pay | Admitting: Physician Assistant

## 2018-07-05 DIAGNOSIS — B353 Tinea pedis: Secondary | ICD-10-CM

## 2018-07-28 ENCOUNTER — Other Ambulatory Visit: Payer: Self-pay | Admitting: Internal Medicine

## 2018-07-29 ENCOUNTER — Other Ambulatory Visit: Payer: Self-pay | Admitting: Family Medicine

## 2018-07-29 ENCOUNTER — Other Ambulatory Visit: Payer: Self-pay | Admitting: Internal Medicine

## 2018-07-29 DIAGNOSIS — R059 Cough, unspecified: Secondary | ICD-10-CM

## 2018-07-29 DIAGNOSIS — R05 Cough: Secondary | ICD-10-CM

## 2018-08-19 ENCOUNTER — Ambulatory Visit: Payer: Medicare HMO | Admitting: Orthopedic Surgery

## 2018-09-12 ENCOUNTER — Other Ambulatory Visit: Payer: Self-pay | Admitting: Internal Medicine

## 2018-09-12 DIAGNOSIS — Z1231 Encounter for screening mammogram for malignant neoplasm of breast: Secondary | ICD-10-CM

## 2018-09-13 ENCOUNTER — Other Ambulatory Visit: Payer: Self-pay | Admitting: Internal Medicine

## 2018-09-16 ENCOUNTER — Ambulatory Visit: Payer: Medicare HMO | Attending: Internal Medicine | Admitting: Internal Medicine

## 2018-09-16 ENCOUNTER — Encounter: Payer: Self-pay | Admitting: Internal Medicine

## 2018-09-16 ENCOUNTER — Telehealth: Payer: Self-pay | Admitting: Internal Medicine

## 2018-09-16 ENCOUNTER — Other Ambulatory Visit: Payer: Self-pay

## 2018-09-16 DIAGNOSIS — E785 Hyperlipidemia, unspecified: Secondary | ICD-10-CM | POA: Diagnosis not present

## 2018-09-16 DIAGNOSIS — R05 Cough: Secondary | ICD-10-CM | POA: Diagnosis not present

## 2018-09-16 DIAGNOSIS — J029 Acute pharyngitis, unspecified: Secondary | ICD-10-CM

## 2018-09-16 DIAGNOSIS — R059 Cough, unspecified: Secondary | ICD-10-CM

## 2018-09-16 DIAGNOSIS — F17200 Nicotine dependence, unspecified, uncomplicated: Secondary | ICD-10-CM | POA: Diagnosis not present

## 2018-09-16 DIAGNOSIS — F17210 Nicotine dependence, cigarettes, uncomplicated: Secondary | ICD-10-CM

## 2018-09-16 DIAGNOSIS — J449 Chronic obstructive pulmonary disease, unspecified: Secondary | ICD-10-CM

## 2018-09-16 DIAGNOSIS — I10 Essential (primary) hypertension: Secondary | ICD-10-CM

## 2018-09-16 MED ORDER — LOVASTATIN 40 MG PO TABS: ORAL_TABLET | ORAL | 3 refills | Status: DC

## 2018-09-16 MED ORDER — AMLODIPINE BESYLATE 10 MG PO TABS: 10.0000 mg | ORAL_TABLET | Freq: Every day | ORAL | 1 refills | Status: DC

## 2018-09-16 MED ORDER — POLYETHYLENE GLYCOL 3350 17 GM/SCOOP PO POWD: 17.0000 g | Freq: Every day | ORAL | 0 refills | Status: DC

## 2018-09-16 MED ORDER — BUDESONIDE-FORMOTEROL FUMARATE 80-4.5 MCG/ACT IN AERO: 2.0000 | INHALATION_SPRAY | Freq: Two times a day (BID) | RESPIRATORY_TRACT | 6 refills | Status: DC

## 2018-09-16 MED ORDER — LISINOPRIL-HYDROCHLOROTHIAZIDE 20-25 MG PO TABS: 1.0000 | ORAL_TABLET | Freq: Every day | ORAL | 3 refills | Status: DC

## 2018-09-16 MED ORDER — AZITHROMYCIN 250 MG PO TABS: ORAL_TABLET | ORAL | 0 refills | Status: DC

## 2018-09-16 MED ORDER — ALBUTEROL SULFATE (2.5 MG/3ML) 0.083% IN NEBU: 2.5000 mg | INHALATION_SOLUTION | Freq: Four times a day (QID) | RESPIRATORY_TRACT | 1 refills | Status: DC | PRN

## 2018-09-16 MED ORDER — CETIRIZINE HCL 10 MG PO TABS: 10.0000 mg | ORAL_TABLET | Freq: Every day | ORAL | 6 refills | Status: DC

## 2018-09-16 MED ORDER — ALBUTEROL SULFATE HFA 108 (90 BASE) MCG/ACT IN AERS: INHALATION_SPRAY | RESPIRATORY_TRACT | 5 refills | Status: DC

## 2018-09-16 NOTE — Telephone Encounter (Signed)
Good afternoon Dr.Johnson i have Alison Ford She is wanting to know what she needs to do in order to get her covid testing done. I explained that we do drive-up testing and informed of [facility] location as an option but she states she was told to come here in person when she spoke to you and is needing to get clarification,

## 2018-09-16 NOTE — Progress Notes (Signed)
Virtual Visit via Telephone Note Due to current restrictions/limitations of in-office visits due to the COVID-19 pandemic, this scheduled clinical appointment was converted to a telehealth visit  I connected with Alison Ford on 09/16/18 at 2:51 p.m by telephone and verified that I am speaking with the correct person using two identifiers. I am in my office.  The patient is at home.  Only the patient and myself participated in this encounter.  I discussed the limitations, risks, security and privacy concerns of performing an evaluation and management service by telephone and the availability of in person appointments. I also discussed with the patient that there may be a patient responsible charge related to this service. The patient expressed understanding and agreed to proceed.   History of Present Illness: Pt with hx of HTN, HL, tob dep, COPD, Vit D def and s/p RT rotator cuff repair and RT CTS release. I last saw her 04/2017.  As of today's visit is urgent care visit for sore throat and needing refills on medications.     Pt c/o sore throat x 3 wks -bring up a Gantt mucous.  No fever or SOB, loss of smell or taste.   -using cough drops which have not helped. She lives alone.  Denies any recent sick contacts.  HYPERTENSION Currently taking: see medication list.  She is needing refills on all of her medicines. Med Adherence: [x]  Yes    []  No Medication side effects: []  Yes    [x]  No Adherence with salt restriction: [x]  Yes    []  No Home Monitoring?: []  Yes    [x]  No Monitoring Frequency: []  Yes    []  No Home BP results range: []  Yes    []  No SOB? []  Yes    [x]  No Chest Pain?: []  Yes    [x]  No Leg swelling?: []  Yes    [x]  No Headaches?: []  Yes    [x]  No Dizziness? []  Yes    [x]  No Comments:   HL  Compliant with Lovastatin  Tob dep:  1 pk last 2-3 days.  Not smoking as much as before.  Wants to quit.  Wants Patches but Medicare dose not cover  COPD:  Using Symbicort only  once a day for past mth because she was about to run out.  Uses her nebulizer treatment a few times a week.  Outpatient Encounter Medications as of 09/16/2018  Medication Sig  . albuterol (PROVENTIL) (2.5 MG/3ML) 0.083% nebulizer solution Take 3 mLs (2.5 mg total) by nebulization every 6 (six) hours as needed for wheezing or shortness of breath.  Marland Kitchen albuterol (VENTOLIN HFA) 108 (90 Base) MCG/ACT inhaler INHALE 2 PUFFS BY MOUTH EVERY 6 HOURS AS NEEDED FOR WHEEZING FOR SHORTNESS OF BREATH  . amitriptyline (ELAVIL) 25 MG tablet Take 1 tablet (25 mg total) by mouth at bedtime as needed for sleep. (Patient not taking: Reported on 04/01/2018)  . amLODipine (NORVASC) 10 MG tablet Take 1 tablet by mouth once daily  . aspirin EC 81 MG tablet Take 1 tablet (81 mg total) by mouth daily.  Marland Kitchen azithromycin (ZITHROMAX) 250 MG tablet Take 2 today then 1 daily (Patient not taking: Reported on 04/01/2018)  . benzonatate (TESSALON) 200 MG capsule Take 1 capsule (200 mg total) by mouth 3 (three) times daily as needed for cough. (Patient not taking: Reported on 09/16/2018)  . budesonide-formoterol (SYMBICORT) 80-4.5 MCG/ACT inhaler Inhale 2 puffs into the lungs 2 (two) times daily.  . cetirizine (ZYRTEC) 10 MG tablet Take  1 tablet by mouth once daily  . clotrimazole-betamethasone (LOTRISONE) cream APPLY  CREAM TOPICALLY TWICE DAILY  . cromolyn (OPTICROM) 4 % ophthalmic solution Place 1 drop into both eyes 4 (four) times daily.  Marland Kitchen lisinopril-hydrochlorothiazide (ZESTORETIC) 20-25 MG tablet Take 1 tablet by mouth once daily  . lovastatin (MEVACOR) 40 MG tablet TAKE 1 TABLET BY MOUTH AT BEDTIME. MUST MAKE APPT FOR FURTHER REFILLS  . nicotine (NICODERM CQ - DOSED IN MG/24 HOURS) 21 mg/24hr patch Place 1 patch (21 mg total) onto the skin daily. (Patient not taking: Reported on 05/30/2017)  . polyethylene glycol powder (GLYCOLAX/MIRALAX) 17 GM/SCOOP powder Take 17 g by mouth daily. MUST MAKE APPT FOR FURTHER REFILLS  .  promethazine-dextromethorphan (PROMETHAZINE-DM) 6.25-15 MG/5ML syrup 2.5-42ml every 4-6 hours prn cough  . tiZANidine (ZANAFLEX) 4 MG tablet Take 1 tablet (4 mg total) by mouth every 6 (six) hours as needed for muscle spasms. (Patient not taking: Reported on 04/01/2018)  . traZODone (DESYREL) 50 MG tablet Take 0.5-1 tablets (25-50 mg total) by mouth at bedtime as needed for sleep. (Patient not taking: Reported on 04/01/2018)   No facility-administered encounter medications on file as of 09/16/2018.     Observations/Objective: No direct observation done as this is a telephone encounter.  Assessment and Plan: 1. Essential hypertension Level of control unknown.  Will refill amlodipine and lisinopril/HCTZ.  Encourage DASH diet - amLODipine (NORVASC) 10 MG tablet; Take 1 tablet (10 mg total) by mouth daily.  Dispense: 90 tablet; Refill: 1 - lisinopril-hydrochlorothiazide (ZESTORETIC) 20-25 MG tablet; Take 1 tablet by mouth daily.  Dispense: 90 tablet; Refill: 3  2. Chronic obstructive pulmonary disease, unspecified COPD type ([place]) Not optimally controlled because she has been using the Symbicort inhaler once a day to stretch it out until she could get refills today.  Advised to use the Symbicort twice a day.  Strongly encouraged her to discontinue smoking - albuterol (VENTOLIN HFA) 108 (90 Base) MCG/ACT inhaler; INHALE 2 PUFFS BY MOUTH EVERY 6 HOURS AS NEEDED FOR WHEEZING FOR SHORTNESS OF BREATH  Dispense: 18 g; Refill: 5 - albuterol (PROVENTIL) (2.5 MG/3ML) 0.083% nebulizer solution; Take 3 mLs (2.5 mg total) by nebulization every 6 (six) hours as needed for wheezing or shortness of breath.  Dispense: 150 mL; Refill: 1 - budesonide-formoterol (SYMBICORT) 80-4.5 MCG/ACT inhaler; Inhale 2 puffs into the lungs 2 (two) times daily.  Dispense: 1 Inhaler; Refill: 6  3. Tobacco dependence Advised to quit smoking.  Discussed health risks associated with smoking.  She would like to try the nicotine patches but  cannot afford.  I have given her the information to call [phone] now to request free nicotine patches -Less than 5 minutes spent on counseling.  4. Hyperlipidemia, unspecified hyperlipidemia type - lovastatin (MEVACOR) 40 MG tablet; TAKE 1 TABLET BY MOUTH AT BEDTIME.  Dispense: 90 tablet; Refill: 3  5. Sore throat We will screen for COVID-19.  She will come tomorrow for a nurse only visit to get the COVID test.  Self quarantine recommended until results are back.  In the meantime I will put her on Zithromax to cover for possible strep. - azithromycin (ZITHROMAX) 250 MG tablet; Take 2 today then 1 daily  Dispense: 6 tablet; Refill: 0 - Novel Coronavirus, NAA ([facility]); Future  6. Cough - Novel Coronavirus, NAA ([facility]); Future  Follow Up Instructions: Follow-up in 2 months   I discussed the assessment and treatment plan with the patient. The patient was provided an opportunity to ask questions  and all were answered. The patient agreed with the plan and demonstrated an understanding of the instructions.   The patient was advised to call back or seek an in-person evaluation if the symptoms worsen or if the condition fails to improve as anticipated.  I provided 15 minutes of non-face-to-face time during this encounter.   Karle Plumber, MD

## 2018-09-17 ENCOUNTER — Ambulatory Visit: Payer: Medicare HMO

## 2018-09-17 DIAGNOSIS — J029 Acute pharyngitis, unspecified: Secondary | ICD-10-CM | POA: Diagnosis not present

## 2018-09-17 DIAGNOSIS — R05 Cough: Secondary | ICD-10-CM | POA: Diagnosis not present

## 2018-09-18 NOTE — Telephone Encounter (Signed)
Pt came for testing on 09/17/18 at 130

## 2018-09-19 ENCOUNTER — Telehealth: Payer: Self-pay

## 2018-09-19 LAB — NOVEL CORONAVIRUS, NAA: SARS-CoV-2, NAA: NOT DETECTED

## 2018-09-19 NOTE — Telephone Encounter (Signed)
Contacted pt to go over lab results pt is aware and doesn't have any questions or concerns 

## 2018-09-27 ENCOUNTER — Telehealth: Payer: Self-pay | Admitting: Internal Medicine

## 2018-09-27 NOTE — Telephone Encounter (Signed)
-----   Message from Jackelyn Knife, [place] sent at 09/19/2018  9:18 AM EDT ----- Please contact pt and schedule a 2 month f/u

## 2018-09-27 NOTE — Telephone Encounter (Signed)
Left vm to schedule follow up

## 2018-10-07 ENCOUNTER — Encounter: Payer: Self-pay | Admitting: Orthopedic Surgery

## 2018-10-07 ENCOUNTER — Ambulatory Visit (INDEPENDENT_AMBULATORY_CARE_PROVIDER_SITE_OTHER): Payer: Medicare HMO | Admitting: Orthopedic Surgery

## 2018-10-07 ENCOUNTER — Other Ambulatory Visit: Payer: Self-pay

## 2018-10-07 ENCOUNTER — Ambulatory Visit (INDEPENDENT_AMBULATORY_CARE_PROVIDER_SITE_OTHER): Payer: Medicare HMO

## 2018-10-07 DIAGNOSIS — T84032A Mechanical loosening of internal right knee prosthetic joint, initial encounter: Secondary | ICD-10-CM | POA: Diagnosis not present

## 2018-10-07 NOTE — Progress Notes (Signed)
Office Visit Note   Patient: Alison Ford           Date of Birth: 27-Oct-1955           MRN: [id number] Visit Date: 10/07/2018 Requested by: Ladell Pier, MD [place],  [place] [address] PCP: Ladell Pier, MD  Subjective: Chief Complaint  Patient presents with  . Right Leg - Pain    HPI: Alison Ford is a patient with right knee and leg pain.  She has a total knee in place from several years ago.  Bone scan that showed likely loosening on the lateral aspect of both the tibial plateau and femoral implant.  The total knee itself is about 63 years old.  She is using a cane.  She does describe having a lot of lateral sided knee pain.  She also reports some mild shoulder and heel pain both on the right-hand side.              ROS: All systems reviewed are negative as they relate to the chief complaint within the history of present illness.  Patient denies  fevers or chills.   Assessment & Plan: Visit Diagnoses:  1. 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  Plan is per patient desire she wants to try therapy first.  I think she will need revision sometime in the future.  She is a [place] hesitant about that because of all of the illness and sickness in the hospital as well as that her likely need to go to skilled nursing after the procedure.  No evidence of infection today based on warmth and absence of effusion.  Follow-up as needed  Follow-Up Instructions: Return if symptoms worsen or fail to improve.   Orders:  Orders Placed This Encounter  Procedures  . XR Knee 1-2 Views Right   No orders of the defined types were placed in this encounter.     Procedures: No procedures performed   Clinical Data: No additional findings.  Objective: Vital Signs: There were no vitals taken for this visit.  Physical Exam:   Constitutional: Patient appears well-developed HEENT:  Head: Normocephalic Eyes:EOM are normal Neck: Normal range of motion Cardiovascular: Normal rate Pulmonary/chest: Effort normal Neurologic: Patient is alert Skin: Skin is warm Psychiatric: Patient has normal mood and affect    Ortho Exam: Ortho exam demonstrates no effusion in the right knee with good range of motion from full extension to about 115 of flexion.  Collaterals are stable.  Extensor mechanism is intact.  Pedal pulses palpable.  No groin pain with internal X rotation of the leg.  No other masses lymphadenopathy or skin changes noted in that right knee region.  Specialty Comments:  No specialty comments available.  Imaging: Xr Knee 1-2 Views Right  Result Date: 10/07/2018 AP lateral right knee reviewed.  Cystic structure noted at the lateral tibial plateau just below the baseplate.  Also lucent line around the cement mantle within the intercondylar notch is noted on the lateral view.    PMFS History: Patient Active Problem List   Diagnosis Date Noted  . Chronic obstructive pulmonary disease ([place]) 05/30/2016  . Rotator cuff tear 05/02/2016  . Rotator cuff tendonitis, right 12/30/2015  . Cervicalgia 12/30/2015  . Eczema, dyshidrotic 11/18/2014  . Acne 11/18/2014  . Smoker 06/01/2014  .  Right knee DJD   . Hyperlipidemia   . HTN (hypertension) 06/05/2012   Past Medical History:  Diagnosis Date  . Abnormal facial hair   . Back pain    d/t knee pain  . Headache(784.0)   . Heart murmur    slight  . HTN (hypertension)    takes Amlodipine daily and HCTZ   . Hyperlipidemia    takes Lovastatin nightly  . Joint pain   . Right knee DJD   . Weakness    left hand    Family History  Problem Relation Age of Onset  . CAD Father 38  . CAD Mother 75  . Hypertension Mother     Past Surgical History:  Procedure Laterality Date  . ABDOMINAL HYSTERECTOMY    . Athroscopic knee surgery      Bilateral  . CARPAL TUNNEL RELEASE Right 05/02/2016   Procedure: CARPAL TUNNEL RELEASE;  Surgeon: Meredith Pel, MD;  Location: [facility];  Service: Orthopedics;  Laterality: Right;  . CHOLECYSTECTOMY    . DILATION AND CURETTAGE OF UTERUS    . EYE SURGERY Right   . SHOULDER ARTHROSCOPY WITH SUBACROMIAL DECOMPRESSION Right 05/02/2016   Procedure: RIGHT SHOULDER ARTHROSCOPY WITH SUBACROMIAL DECOMPRESSION, MINI OPEN ROTATOR CUFF TEAR REPAIR, AND RIGHT CARPAL TUNNEL RELEASE;  Surgeon: Meredith Pel, MD;  Location: [facility];  Service: Orthopedics;  Laterality: Right;  . TOTAL KNEE ARTHROPLASTY Right 07/15/2012   Procedure: RIGHT TOTAL KNEE ARTHROPLASTY;  Surgeon: Lorn Junes, MD;  Location: [facility];  Service: Orthopedics;  Laterality: Right;   Social History   Occupational History    Employer: [id number]  Tobacco Use  . Smoking status: Current Every Day Smoker    Packs/day: 0.50  . Smokeless tobacco: Never Used  . Tobacco comment: Smoking 3-4 cigs/day  Substance and Sexual Activity  . Alcohol use: Yes    Alcohol/week: 0.0 standard drinks    Comment: ocassionally  . Drug use: No  . Sexual activity: Never

## 2018-10-07 NOTE — Addendum Note (Signed)
Addended byLaurann Montana on: 10/07/2018 04:38 PM   Modules accepted: Orders

## 2018-10-10 ENCOUNTER — Other Ambulatory Visit: Payer: Self-pay | Admitting: Physician Assistant

## 2018-10-28 ENCOUNTER — Ambulatory Visit
Admission: RE | Admit: 2018-10-28 | Discharge: 2018-10-28 | Disposition: A | Discharge status: Home / Self Care | Payer: Medicare HMO | Source: Ambulatory Visit | Attending: Internal Medicine | Admitting: Internal Medicine

## 2018-10-28 ENCOUNTER — Other Ambulatory Visit: Payer: Self-pay

## 2018-10-28 DIAGNOSIS — Z1231 Encounter for screening mammogram for malignant neoplasm of breast: Secondary | ICD-10-CM | POA: Diagnosis not present

## 2018-11-04 ENCOUNTER — Ambulatory Visit: Payer: Medicare HMO | Admitting: Orthopedic Surgery

## 2018-11-12 DIAGNOSIS — K5900 Constipation, unspecified: Secondary | ICD-10-CM | POA: Diagnosis not present

## 2018-11-12 DIAGNOSIS — J449 Chronic obstructive pulmonary disease, unspecified: Secondary | ICD-10-CM | POA: Diagnosis not present

## 2018-12-05 DIAGNOSIS — H2513 Age-related nuclear cataract, bilateral: Secondary | ICD-10-CM | POA: Diagnosis not present

## 2018-12-05 DIAGNOSIS — H40013 Open angle with borderline findings, low risk, bilateral: Secondary | ICD-10-CM | POA: Diagnosis not present

## 2018-12-05 DIAGNOSIS — H04123 Dry eye syndrome of bilateral lacrimal glands: Secondary | ICD-10-CM | POA: Diagnosis not present

## 2018-12-05 DIAGNOSIS — H10413 Chronic giant papillary conjunctivitis, bilateral: Secondary | ICD-10-CM | POA: Diagnosis not present

## 2018-12-20 ENCOUNTER — Encounter: Payer: Self-pay | Admitting: Internal Medicine

## 2018-12-20 ENCOUNTER — Other Ambulatory Visit: Payer: Self-pay

## 2018-12-20 ENCOUNTER — Ambulatory Visit: Payer: Medicare HMO | Attending: Internal Medicine | Admitting: Internal Medicine

## 2018-12-20 DIAGNOSIS — I10 Essential (primary) hypertension: Secondary | ICD-10-CM

## 2018-12-20 DIAGNOSIS — Z1211 Encounter for screening for malignant neoplasm of colon: Secondary | ICD-10-CM | POA: Diagnosis not present

## 2018-12-20 DIAGNOSIS — M25561 Pain in right knee: Secondary | ICD-10-CM

## 2018-12-20 DIAGNOSIS — J449 Chronic obstructive pulmonary disease, unspecified: Secondary | ICD-10-CM

## 2018-12-20 DIAGNOSIS — G8929 Other chronic pain: Secondary | ICD-10-CM

## 2018-12-20 DIAGNOSIS — F17200 Nicotine dependence, unspecified, uncomplicated: Secondary | ICD-10-CM | POA: Diagnosis not present

## 2018-12-20 DIAGNOSIS — D509 Iron deficiency anemia, unspecified: Secondary | ICD-10-CM

## 2018-12-20 DIAGNOSIS — E559 Vitamin D deficiency, unspecified: Secondary | ICD-10-CM | POA: Diagnosis not present

## 2018-12-20 NOTE — Progress Notes (Addendum)
Virtual Visit via Telephone Note  I connected with Alison Ford on 12/20/18 at 12:17 p.m by telephone and verified that I am speaking with the correct person using two identifiers.   I discussed the limitations, risks, security and privacy concerns of performing an evaluation and management service by telephone and the availability of in person appointments. I also discussed with the patient that there may be a patient responsible charge related to this service. The patient expressed understanding and agreed to proceed.   History of Present Illness: Pt with hx of HTN, HL, tob dep, COPD, Vit D def and s/p RT rotator cuff repair and RT CTS release, LT OA knee.  Last evaluation was done 08/2018.   C/o pain RT knee.  Followed by Dr. Marlou Sa.  Hx of TKR of this knee 5 yrs ago.  Has loosening of prosthesis and surgery recommend but patient wants to try some physical therapy first. -ambulates with cane.  No falls  HYPERTENSION Currently taking: see medication list.  Medications include lisinopril/HCTZ and amlodipine Med Adherence: [x]  Yes    []  No Medication side effects: []  Yes    [x]  No Adherence with salt restriction: [x]  Yes    []  No Home Monitoring?: []  Yes    [x]  No - no device but plans to get one Monitoring Frequency: []  Yes    []  No Home BP results range: []  Yes    []  No SOB? [x]  Yes -due to COPD   []  No Chest Pain?: []  Yes    [x]  No Leg swelling?: []  Yes    [x]  No Headaches?: []  Yes    [x]  No Dizziness? []  Yes    [x]  No Comments:   COPD:  No recent flares.  Using Symbicort once a day and Ventolin BID. Difficult when having to wear mask No increase SOB.  No coughing Still smoking 1/2 pk/day.  Wants to quit.  Plans to call 1-800-Quit Now to get free patches  Vit D def:  Not taking Vit D supplement.  Last vitamin D level was within normal range.  It was 44.  HM:  Had f/u shot in 10/2018 at [facility].  Had c-scope 11/2015 by Dr. Laurence Spates.  Had lipoma  removed from the cecum and a  polyp removed.  It is showing on health maintenance that she is due again and she is agreeable to referral. Outpatient Encounter Medications as of 12/20/2018  Medication Sig  . albuterol (PROVENTIL) (2.5 MG/3ML) 0.083% nebulizer solution Take 3 mLs (2.5 mg total) by nebulization every 6 (six) hours as needed for wheezing or shortness of breath.  Marland Kitchen albuterol (VENTOLIN HFA) 108 (90 Base) MCG/ACT inhaler INHALE 2 PUFFS BY MOUTH EVERY 6 HOURS AS NEEDED FOR WHEEZING FOR SHORTNESS OF BREATH  . amLODipine (NORVASC) 10 MG tablet Take 1 tablet (10 mg total) by mouth daily.  Marland Kitchen aspirin EC 81 MG tablet Take 1 tablet (81 mg total) by mouth daily.  Marland Kitchen azithromycin (ZITHROMAX) 250 MG tablet Take 2 today then 1 daily (Patient not taking: Reported on 12/20/2018)  . budesonide-formoterol (SYMBICORT) 80-4.5 MCG/ACT inhaler Inhale 2 puffs into the lungs 2 (two) times daily.  . cetirizine (ZYRTEC) 10 MG tablet Take 1 tablet (10 mg total) by mouth daily.  . clotrimazole-betamethasone (LOTRISONE) cream APPLY  CREAM TOPICALLY TWICE DAILY  . cromolyn (OPTICROM) 4 % ophthalmic solution Place 1 drop into both eyes 4 (four) times daily.  Marland Kitchen lisinopril-hydrochlorothiazide (ZESTORETIC) 20-25 MG tablet Take 1 tablet by mouth daily.  Marland Kitchen  lovastatin (MEVACOR) 40 MG tablet TAKE 1 TABLET BY MOUTH AT BEDTIME.  . polyethylene glycol powder (GLYCOLAX/MIRALAX) 17 GM/SCOOP powder Take 17 g by mouth daily.  . promethazine-dextromethorphan (PROMETHAZINE-DM) 6.25-15 MG/5ML syrup 2.5-17ml every 4-6 hours prn cough (Patient not taking: Reported on 12/20/2018)   No facility-administered encounter medications on file as of 12/20/2018.     Observations/Objective: No direct observation done as this was a telephone visit  Assessment and Plan: 1. Essential hypertension Patient to continue current medications.  Until she gets her home blood pressure monitoring device, I recommend checking blood pressure at [place].  Goal is 130/80 or lower. - CBC;  Future - Comprehensive metabolic panel; Future - Lipid panel; Future  2. Chronic obstructive pulmonary disease, unspecified COPD type ([place]) Stable.  However I recommend that she uses Symbicort twice a day as prescribed.  She will find that with using the Symbicort twice a day she will not have to use the Ventolin inhaler as often.  3. Tobacco dependence Advised to quit.  She wants to quit.  She plans to call 1 [phone] quit now to get nicotine patches.  Less than 5 minutes spent on counseling  4. Vitamin D deficiency - VITAMIN D 25 Hydroxy (Vit-D Deficiency, Fractures); Future  5. Chronic pain of right knee Followed by orthopedics  6. Screening for colon cancer - Ambulatory referral to Gastroenterology   Follow Up Instructions: 3-4 mths   I discussed the assessment and treatment plan with the patient. The patient was provided an opportunity to ask questions and all were answered. The patient agreed with the plan and demonstrated an understanding of the instructions.   The patient was advised to call back or seek an in-person evaluation if the symptoms worsen or if the condition fails to improve as anticipated.  Addendum 12/26/2018: Patient with macrocytic anemia on CBC.  I will add iron studies.  I provided 16 minutes of non-face-to-face time during this encounter.   Karle Plumber, MD

## 2018-12-23 ENCOUNTER — Telehealth: Payer: Self-pay

## 2018-12-23 NOTE — Telephone Encounter (Signed)
Contacted pt to schedule 4 month f/u and lab visit. Pt didn't answer lvm asking pt to give a call back to schedule

## 2018-12-25 ENCOUNTER — Other Ambulatory Visit: Payer: Self-pay

## 2018-12-25 ENCOUNTER — Ambulatory Visit: Payer: Medicare HMO | Attending: Internal Medicine

## 2018-12-25 DIAGNOSIS — I10 Essential (primary) hypertension: Secondary | ICD-10-CM

## 2018-12-25 DIAGNOSIS — E559 Vitamin D deficiency, unspecified: Secondary | ICD-10-CM

## 2018-12-26 ENCOUNTER — Telehealth: Payer: Self-pay

## 2018-12-26 LAB — CBC
Hematocrit: 34.7 % (ref 34.0–46.6)
Hemoglobin: 10.7 g/dL — ABNORMAL LOW (ref 11.1–15.9)
MCH: 24.2 pg — ABNORMAL LOW (ref 26.6–33.0)
MCHC: 30.8 g/dL — ABNORMAL LOW (ref 31.5–35.7)
MCV: 79 fL (ref 79–97)
Platelets: 294 10*3/uL (ref 150–450)
RBC: 4.42 x10E6/uL (ref 3.77–5.28)
RDW: 15.4 % (ref 11.7–15.4)
WBC: 5.6 10*3/uL (ref 3.4–10.8)

## 2018-12-26 LAB — LIPID PANEL
Chol/HDL Ratio: 2.8 ratio (ref 0.0–4.4)
Cholesterol, Total: 195 mg/dL (ref 100–199)
HDL: 69 mg/dL (ref >39)
LDL Chol Calc (NIH): 109 mg/dL — ABNORMAL HIGH (ref 0–99)
Triglycerides: 95 mg/dL (ref 0–149)
VLDL Cholesterol Cal: 17 mg/dL (ref 5–40)

## 2018-12-26 LAB — COMPREHENSIVE METABOLIC PANEL
ALT: 17 IU/L (ref 0–32)
AST: 21 IU/L (ref 0–40)
Albumin/Globulin Ratio: 1.7 (ref 1.2–2.2)
Albumin: 4.4 g/dL (ref 3.8–4.8)
Alkaline Phosphatase: 90 IU/L (ref 39–117)
BUN/Creatinine Ratio: 14 (ref 12–28)
BUN: 11 mg/dL (ref 8–27)
Bilirubin Total: 0.2 mg/dL (ref 0.0–1.2)
CO2: 22 mmol/L (ref 20–29)
Calcium: 9.6 mg/dL (ref 8.7–10.3)
Chloride: 102 mmol/L (ref 96–106)
Creatinine, Ser: 0.79 mg/dL (ref 0.57–1.00)
GFR calc Af Amer: 92 mL/min/{1.73_m2} (ref >59)
GFR calc non Af Amer: 80 mL/min/{1.73_m2} (ref >59)
Globulin, Total: 2.6 g/dL (ref 1.5–4.5)
Glucose: 94 mg/dL (ref 65–99)
Potassium: 4.2 mmol/L (ref 3.5–5.2)
Sodium: 138 mmol/L (ref 134–144)
Total Protein: 7 g/dL (ref 6.0–8.5)

## 2018-12-26 LAB — VITAMIN D 25 HYDROXY (VIT D DEFICIENCY, FRACTURES): Vit D, 25-Hydroxy: 24.1 ng/mL — ABNORMAL LOW (ref 30.0–100.0)

## 2018-12-26 NOTE — Telephone Encounter (Signed)
Contacted pt to go over lab results pt didn't answer lvm asking pt to give me a call back to go over results

## 2018-12-26 NOTE — Telephone Encounter (Signed)
Pt. Called back regarding lab results. Pt. Was informed on lab results and understood.

## 2018-12-26 NOTE — Addendum Note (Signed)
Addended by: Karle Plumber B on: 12/26/2018 08:23 AM   Modules accepted: Orders

## 2019-01-14 ENCOUNTER — Ambulatory Visit: Payer: Medicare HMO | Admitting: Physical Therapy

## 2019-01-25 DIAGNOSIS — Z0100 Encounter for examination of eyes and vision without abnormal findings: Secondary | ICD-10-CM | POA: Diagnosis not present

## 2019-01-28 ENCOUNTER — Other Ambulatory Visit: Payer: Self-pay

## 2019-01-28 ENCOUNTER — Ambulatory Visit: Payer: Medicare HMO | Attending: Orthopedic Surgery

## 2019-01-28 DIAGNOSIS — M6281 Muscle weakness (generalized): Secondary | ICD-10-CM | POA: Insufficient documentation

## 2019-01-28 DIAGNOSIS — M25561 Pain in right knee: Secondary | ICD-10-CM | POA: Diagnosis not present

## 2019-01-28 DIAGNOSIS — G8929 Other chronic pain: Secondary | ICD-10-CM | POA: Diagnosis not present

## 2019-01-28 DIAGNOSIS — R2689 Other abnormalities of gait and mobility: Secondary | ICD-10-CM | POA: Diagnosis not present

## 2019-01-28 NOTE — Therapy (Signed)
[place], [place], [address] Phone: [phone]   Fax:  [phone]  Physical Therapy Evaluation  Patient Details  Name: Alison Ford MRN: [id number] Date of Birth: [birth year] Referring Provider (PT): Josiah Lobo, MD   Encounter Date: 01/28/2019  PT End of Session - 01/28/19 1313    Visit Number  1    Number of Visits  10    Date for PT Re-Evaluation  03/16/19    Authorization Type  humana    PT Start Time  1215    PT Stop Time  1300    PT Time Calculation (min)  45 min    Activity Tolerance  Patient tolerated treatment well    Behavior During Therapy  [facility] for tasks assessed/performed       Past Medical History:  Diagnosis Date  . Abnormal facial hair   . Back pain    d/t knee pain  . Headache(784.0)   . Heart murmur    slight  . HTN (hypertension)    takes Amlodipine daily and HCTZ   . Hyperlipidemia    takes Lovastatin nightly  . Joint pain   . Right knee DJD   . Weakness    left hand    Past Surgical History:  Procedure Laterality Date  . ABDOMINAL HYSTERECTOMY    . Athroscopic knee surgery     Bilateral  . CARPAL TUNNEL RELEASE Right 05/02/2016   Procedure: CARPAL TUNNEL RELEASE;  Surgeon: Meredith Pel, MD;  Location: [facility];  Service: Orthopedics;  Laterality: Right;  . CHOLECYSTECTOMY    . DILATION AND CURETTAGE OF UTERUS    . EYE SURGERY Right   . SHOULDER ARTHROSCOPY WITH SUBACROMIAL DECOMPRESSION Right 05/02/2016   Procedure: RIGHT SHOULDER ARTHROSCOPY WITH SUBACROMIAL DECOMPRESSION, MINI OPEN ROTATOR CUFF TEAR REPAIR, AND RIGHT CARPAL TUNNEL RELEASE;  Surgeon: Meredith Pel, MD;  Location: [facility];  Service: Orthopedics;  Laterality: Right;  . TOTAL KNEE ARTHROPLASTY Right 07/15/2012   Procedure: RIGHT TOTAL KNEE ARTHROPLASTY;  Surgeon: Lorn Junes, MD;  Location: [facility];  Service: Orthopedics;  Laterality: Right;    There were no vitals filed for this  visit.   Subjective Assessment - 01/28/19 1221    Subjective  Both knees problematic Rt knee weak and gives out so she walks less. . Strong spasm with throb and ache, burn in the lateral rt knee into calf. No specific injury.   Had continued RT knee pain since surgery and has had PT in 2019 for RT knee.    She wants to walk bettter with less pain    Limitations  Walking;Standing;House hold activities    How long can you sit comfortably?  as needed    How long can you walk comfortably?  1 block    Diagnostic tests  xray: possible loosenin of prosthesis    Patient Stated Goals  Walk better with less pain.    Currently in Pain?  No/denies    Pain Score  3     Pain Location  Knee    Pain Orientation  Right;Lateral;Posterior    Pain Descriptors / Indicators  Throbbing;Sharp;Aching;Burning    Pain Type  Chronic pain    Pain Onset  More than a month ago    Pain Frequency  Constant    Aggravating Factors   activity on feet    Pain Relieving Factors  Biofreeze,         [facility] PT Assessment -  01/28/19 0001      Assessment   Medical Diagnosis  lose RT knee prosthetics    Referring Provider (PT)  Josiah Lobo, MD    Onset Date/Surgical Date  --   TKA 6 years ago   Prior Therapy  PT      Precautions   Precautions  None      Restrictions   Weight Bearing Restrictions  No      Balance Screen   Has the patient fallen in the past 6 months  No    Has the patient had a decrease in activity level because of a fear of falling?   Yes    Is the patient reluctant to leave their home because of a fear of falling?   No      Home Environment   Living Environment  Private residence    Living Arrangements  Alone    Type of [place] Access  Level entry    Home Layout  One level      Prior Function   Level of Independence  Independent      Cognition   Overall Cognitive Status  Within Functional Limits for tasks assessed      Observation/Other Assessments   Focus on  Therapeutic Outcomes (FOTO)   64% limited      ROM / Strength   AROM / PROM / Strength  AROM;Strength      AROM   AROM Assessment Site  Knee    Right/Left Knee  Right;Left    Right Knee Extension  -5    Right Knee Flexion  130    Left Knee Extension  0    Left Knee Flexion  120      Strength   Strength Assessment Site  Knee;Hip    Right/Left Hip  Right;Left    Right Hip Flexion  4/5    Right Hip Extension  3+/5    Right Hip External Rotation   4+/5    Right Hip Internal Rotation  4/5    Right Hip ABduction  4-/5    Left Hip Flexion  5/5    Left Hip Extension  3+/5    Left Hip External Rotation  4+/5    Left Hip Internal Rotation  4/5    Left Hip ABduction  4-/5    Right/Left Knee  Right;Left    Right Knee Flexion  5/5    Right Knee Extension  4+/5    Left Knee Flexion  5/5    Left Knee Extension  5/5      Flexibility   Soft Tissue Assessment /Muscle Length  yes    Hamstrings  SLR 75 degrees bilatera      Ambulation/Gait   Gait Comments  SPC  steady , no LOB                 Objective measurements completed on examination: See above findings.              PT Education - 01/28/19 1226    Education Details  POC    Person(s) Educated  Patient    Methods  Explanation    Comprehension  Verbalized understanding       PT Short Term Goals - 01/28/19 1320      PT SHORT TERM GOAL #1   Title  She will be independent with initial hEP    Time  2    Period  Weeks  Status  New        PT Long Term Goals - 01/28/19 1320      PT LONG TERM GOAL #2   Title  increase bil LE strength to >/=4+/5 to promote knee stability with standing and walking activities    Baseline  hip weakness    Time  6    Period  Weeks    Status  New      PT LONG TERM GOAL #3   Title  pt to be able to sit, stand and walk >/=30 min for functional endurance required for ADLs and walking in community    Time  6    Period  Weeks    Status  New      PT LONG TERM GOAL #4    Title  increase FOTO score to </= 43% limited to demo improvement in function    Time  6    Period  Weeks    Status  New      PT LONG TERM GOAL #5   Title  pt will be I with all HEP given as of last visit to maintain and progress current level of function    Time  6    Period  Weeks    Status  New             Plan - 01/28/19 1314    Clinical Impression Statement  Ms Zechman presents with reports of chronic knee pain years post TKA . Md reports probable loosening of prosthesis of the RT knee and possible revieion. She has been seen in PT in the past for the same issue. [place] she is weak in both hips and RT quads.   Pain limits effort.   She is not doing a HEP.  She needs to start consistent HEP for strength and this should help her mobility and may help decr pain.   $ visits may be too [place] to establish the program and progress so will plan on 4 visits and if she is compliant with her HEP continue 4-6 visits 1x/week to progress HEP    Personal Factors and Comorbidities  Past/Current Experience;Time since onset of injury/illness/exacerbation;Comorbidity 1;Comorbidity 2    Comorbidities  RT TKA , LT TKA    Examination-Activity Limitations  Locomotion Level;Stairs;Stand    Examination-Participation Restrictions  Community Activity    Stability/Clinical Decision Making  Stable/Uncomplicated    Clinical Decision Making  Low    Rehab Potential  Good    PT Frequency  2x / week    PT Duration  2 weeks   then 1x/week for 4-6 weeks post   PT Treatment/Interventions  Gait training;Stair training;Therapeutic activities;Therapeutic exercise;Balance training;Moist Heat;Cryotherapy;Patient/family education;Manual techniques;Passive range of motion;Taping    PT Next Visit Plan  Start HEP  and concentrate on LE strength    Consulted and Agree with Plan of Care  Patient       Patient will benefit from skilled therapeutic intervention in order to improve the following deficits and impairments:   Decreased range of motion, Difficulty walking, Pain, Decreased activity tolerance, Decreased strength  Visit Diagnosis: Muscle weakness (generalized) - Plan: PT plan of care cert/re-cert  Chronic pain of right knee - Plan: PT plan of care cert/re-cert  Other abnormalities of gait and mobility - Plan: PT plan of care cert/re-cert     Problem List Patient Active Problem List   Diagnosis Date Noted  . Chronic obstructive pulmonary disease ([place]) 05/30/2016  . Rotator cuff tear  05/02/2016  . Rotator cuff tendonitis, right 12/30/2015  . Cervicalgia 12/30/2015  . Eczema, dyshidrotic 11/18/2014  . Acne 11/18/2014  . Smoker 06/01/2014  . Right knee DJD   . Hyperlipidemia   . HTN (hypertension) 06/05/2012    Darrel Hoover  PT 01/28/2019, 1:27 PM  [facility] [address] [place], [place], [address] Phone: [phone]   Fax:  [phone]  Name: Alison Ford MRN: [id number] Date of Birth: May 23, 1955

## 2019-02-06 ENCOUNTER — Other Ambulatory Visit: Payer: Self-pay

## 2019-02-06 ENCOUNTER — Encounter: Payer: Self-pay | Admitting: Physical Therapy

## 2019-02-06 ENCOUNTER — Ambulatory Visit: Payer: Medicare HMO | Admitting: Physical Therapy

## 2019-02-06 DIAGNOSIS — G8929 Other chronic pain: Secondary | ICD-10-CM | POA: Diagnosis not present

## 2019-02-06 DIAGNOSIS — M25561 Pain in right knee: Secondary | ICD-10-CM | POA: Diagnosis not present

## 2019-02-06 DIAGNOSIS — R2689 Other abnormalities of gait and mobility: Secondary | ICD-10-CM

## 2019-02-06 DIAGNOSIS — M6281 Muscle weakness (generalized): Secondary | ICD-10-CM

## 2019-02-06 NOTE — Therapy (Signed)
[place], [place], [address] Phone: [phone]   Fax:  [phone]  Physical Therapy Treatment  Patient Details  Name: Alison Ford MRN: [id number] Date of Birth: October 10, 1955 Referring Provider (PT): Josiah Lobo, MD   Encounter Date: 02/06/2019  PT End of Session - 02/06/19 1152    Visit Number  2    Number of Visits  10    Date for PT Re-Evaluation  03/16/19    Authorization Type  humana    PT Start Time  [id number]    PT Stop Time  1222    PT Time Calculation (min)  38 min       Past Medical History:  Diagnosis Date  . Abnormal facial hair   . Back pain    d/t knee pain  . Headache(784.0)   . Heart murmur    slight  . HTN (hypertension)    takes Amlodipine daily and HCTZ   . Hyperlipidemia    takes Lovastatin nightly  . Joint pain   . Right knee DJD   . Weakness    left hand    Past Surgical History:  Procedure Laterality Date  . ABDOMINAL HYSTERECTOMY    . Athroscopic knee surgery     Bilateral  . CARPAL TUNNEL RELEASE Right 05/02/2016   Procedure: CARPAL TUNNEL RELEASE;  Surgeon: Meredith Pel, MD;  Location: [facility];  Service: Orthopedics;  Laterality: Right;  . CHOLECYSTECTOMY    . DILATION AND CURETTAGE OF UTERUS    . EYE SURGERY Right   . SHOULDER ARTHROSCOPY WITH SUBACROMIAL DECOMPRESSION Right 05/02/2016   Procedure: RIGHT SHOULDER ARTHROSCOPY WITH SUBACROMIAL DECOMPRESSION, MINI OPEN ROTATOR CUFF TEAR REPAIR, AND RIGHT CARPAL TUNNEL RELEASE;  Surgeon: Meredith Pel, MD;  Location: [facility];  Service: Orthopedics;  Laterality: Right;  . TOTAL KNEE ARTHROPLASTY Right 07/15/2012   Procedure: RIGHT TOTAL KNEE ARTHROPLASTY;  Surgeon: Lorn Junes, MD;  Location: [facility];  Service: Orthopedics;  Laterality: Right;    There were no vitals filed for this visit.  Subjective Assessment - 02/06/19 1149    Subjective  Pt arrives reporting achiness in right knee.    Currently in  Pain?  Yes    Pain Score  7     Pain Location  Knee    Pain Orientation  Right    Pain Descriptors / Indicators  Aching    Pain Type  Chronic pain    Aggravating Factors   first get up from sititng, activity on feet    Pain Relieving Factors  biofreeze  , tylenol                       [facility] Adult PT Treatment/Exercise - 02/06/19 0001      Knee/Hip Exercises: Stretches   Active Hamstring Stretch  3 reps;10 seconds      Knee/Hip Exercises: Aerobic   Nustep  5 minutes L3 LE only       Knee/Hip Exercises: Seated   Long Arc Quad  20 reps    Sit to [employer]  10 reps      Knee/Hip Exercises: Supine   Bridges  10 reps    Straight Leg Raises  10 reps;2 sets      Knee/Hip Exercises: Sidelying   Hip ABduction  10 reps;2 sets      Knee/Hip Exercises: Prone   Hip Extension  10 reps;2 sets  PT Education - 02/06/19 1220    Education Details  HEP    Person(s) Educated  Patient    Methods  Explanation;Handout    Comprehension  Verbalized understanding       PT Short Term Goals - 01/28/19 1320      PT SHORT TERM GOAL #1   Title  She will be independent with initial hEP    Time  2    Period  Weeks    Status  New        PT Long Term Goals - 01/28/19 1320      PT LONG TERM GOAL #2   Title  increase bil LE strength to >/=4+/5 to promote knee stability with standing and walking activities    Baseline  hip weakness    Time  6    Period  Weeks    Status  New      PT LONG TERM GOAL #3   Title  pt to be able to sit, stand and walk >/=30 min for functional endurance required for ADLs and walking in community    Time  6    Period  Weeks    Status  New      PT LONG TERM GOAL #4   Title  increase FOTO score to </= 43% limited to demo improvement in function    Time  6    Period  Weeks    Status  New      PT LONG TERM GOAL #5   Title  pt will be I with all HEP given as of last visit to maintain and progress current level of function    Time  6     Period  Weeks    Status  New            Plan - 02/06/19 1300    Clinical Impression Statement  Treatment session focused on establishing initial HEP for LE strength. No increased pain.    PT Next Visit Plan  Start HEP  and concentrate on LE strength    PT Home Exercise Plan  3 way hip supine, bridge, sit-stand       Patient will benefit from skilled therapeutic intervention in order to improve the following deficits and impairments:  Decreased range of motion, Difficulty walking, Pain, Decreased activity tolerance, Decreased strength  Visit Diagnosis: Muscle weakness (generalized)  Chronic pain of right knee  Other abnormalities of gait and mobility     Problem List Patient Active Problem List   Diagnosis Date Noted  . Chronic obstructive pulmonary disease ([place]) 05/30/2016  . Rotator cuff tear 05/02/2016  . Rotator cuff tendonitis, right 12/30/2015  . Cervicalgia 12/30/2015  . Eczema, dyshidrotic 11/18/2014  . Acne 11/18/2014  . Smoker 06/01/2014  . Right knee DJD   . Hyperlipidemia   . HTN (hypertension) 06/05/2012    Dorene Ar, PTA 02/06/2019, 1:02 PM  [facility] [address] [place], [place], [address] Phone: [phone]   Fax:  [phone]  Name: Alison Ford MRN: [id number] Date of Birth: 11-Mar-1955

## 2019-02-11 ENCOUNTER — Other Ambulatory Visit: Payer: Self-pay

## 2019-02-11 ENCOUNTER — Ambulatory Visit: Payer: Medicare HMO

## 2019-02-11 DIAGNOSIS — M6281 Muscle weakness (generalized): Secondary | ICD-10-CM | POA: Diagnosis not present

## 2019-02-11 DIAGNOSIS — R2689 Other abnormalities of gait and mobility: Secondary | ICD-10-CM

## 2019-02-11 DIAGNOSIS — M25561 Pain in right knee: Secondary | ICD-10-CM | POA: Diagnosis not present

## 2019-02-11 DIAGNOSIS — G8929 Other chronic pain: Secondary | ICD-10-CM | POA: Diagnosis not present

## 2019-02-11 NOTE — Therapy (Signed)
[place], [place], [address] Phone: [phone]   Fax:  [phone]  Physical Therapy Treatment  Patient Details  Name: Alison Ford MRN: [id number] Date of Birth: 12-15-1955 Referring Provider (PT): Josiah Lobo, MD   Encounter Date: 02/11/2019  PT End of Session - 02/11/19 1216    Visit Number  3    Number of Visits  10    Date for PT Re-Evaluation  03/16/19    Authorization Type  humana    PT Start Time  1216    PT Stop Time  1254    PT Time Calculation (min)  38 min    Activity Tolerance  Patient tolerated treatment well    Behavior During Therapy  [facility] for tasks assessed/performed       Past Medical History:  Diagnosis Date  . Abnormal facial hair   . Back pain    d/t knee pain  . Headache(784.0)   . Heart murmur    slight  . HTN (hypertension)    takes Amlodipine daily and HCTZ   . Hyperlipidemia    takes Lovastatin nightly  . Joint pain   . Right knee DJD   . Weakness    left hand    Past Surgical History:  Procedure Laterality Date  . ABDOMINAL HYSTERECTOMY    . Athroscopic knee surgery     Bilateral  . CARPAL TUNNEL RELEASE Right 05/02/2016   Procedure: CARPAL TUNNEL RELEASE;  Surgeon: Meredith Pel, MD;  Location: [place];  Service: Orthopedics;  Laterality: Right;  . CHOLECYSTECTOMY    . DILATION AND CURETTAGE OF UTERUS    . EYE SURGERY Right   . SHOULDER ARTHROSCOPY WITH SUBACROMIAL DECOMPRESSION Right 05/02/2016   Procedure: RIGHT SHOULDER ARTHROSCOPY WITH SUBACROMIAL DECOMPRESSION, MINI OPEN ROTATOR CUFF TEAR REPAIR, AND RIGHT CARPAL TUNNEL RELEASE;  Surgeon: Meredith Pel, MD;  Location: [facility];  Service: Orthopedics;  Laterality: Right;  . TOTAL KNEE ARTHROPLASTY Right 07/15/2012   Procedure: RIGHT TOTAL KNEE ARTHROPLASTY;  Surgeon: Lorn Junes, MD;  Location: [facility];  Service: Orthopedics;  Laterality: Right;    There were no vitals filed for this  visit.  Subjective Assessment - 02/11/19 1224    Subjective  Have been doing exercise and knees still hurt but OK today                       [facility] Adult PT Treatment/Exercise - 02/11/19 0001      Knee/Hip Exercises: Aerobic   Nustep  6 min LE only      Knee/Hip Exercises: Standing   Forward Step Up  15 reps;Hand Hold: 1;Step Height: 4"      Knee/Hip Exercises: Seated   Long Arc Quad  Right;Left;20 reps    Sit to [employer]  without UE support;15 reps      Knee/Hip Exercises: Supine   Bridges  20 reps    Bridges with [employer]  15 reps    Straight Leg Raises  Right;Left;2 sets      Knee/Hip Exercises: Sidelying   Hip ABduction  Right;Left;20 reps    Clams  x15 RT/LT      Knee/Hip Exercises: Prone   Hip Extension  Right;Left;15 reps               PT Short Term Goals - 01/28/19 1320      PT SHORT TERM GOAL #1   Title  She will  be independent with initial hEP    Time  2    Period  Weeks    Status  New        PT Long Term Goals - 01/28/19 1320      PT LONG TERM GOAL #2   Title  increase bil LE strength to >/=4+/5 to promote knee stability with standing and walking activities    Baseline  hip weakness    Time  6    Period  Weeks    Status  New      PT LONG TERM GOAL #3   Title  pt to be able to sit, stand and walk >/=30 min for functional endurance required for ADLs and walking in community    Time  6    Period  Weeks    Status  New      PT LONG TERM GOAL #4   Title  increase FOTO score to </= 43% limited to demo improvement in function    Time  6    Period  Weeks    Status  New      PT LONG TERM GOAL #5   Title  pt will be I with all HEP given as of last visit to maintain and progress current level of function    Time  6    Period  Weeks    Status  New            Plan - 02/11/19 1217    Clinical Impression Statement  She rpeorts she is feeling better with less pain. She appears to be doing her HEP  and tolerated incr reps.  Will have her do her exercises with bands next session.    PT Treatment/Interventions  Gait training;Stair training;Therapeutic activities;Therapeutic exercise;Balance training;Moist Heat;Cryotherapy;Patient/family education;Manual techniques;Passive range of motion;Taping    PT Next Visit Plan  Continue with LE strength  and add bands as tolerated to exercises    PT Home Exercise Plan  3 way hip supine, bridge, sit-stand    Consulted and Agree with Plan of Care  Patient       Patient will benefit from skilled therapeutic intervention in order to improve the following deficits and impairments:  Decreased range of motion, Difficulty walking, Pain, Decreased activity tolerance, Decreased strength  Visit Diagnosis: Muscle weakness (generalized)  Chronic pain of right knee  Other abnormalities of gait and mobility     Problem List Patient Active Problem List   Diagnosis Date Noted  . Chronic obstructive pulmonary disease ([place]) 05/30/2016  . Rotator cuff tear 05/02/2016  . Rotator cuff tendonitis, right 12/30/2015  . Cervicalgia 12/30/2015  . Eczema, dyshidrotic 11/18/2014  . Acne 11/18/2014  . Smoker 06/01/2014  . Right knee DJD   . Hyperlipidemia   . HTN (hypertension) 06/05/2012    Darrel Hoover  PT 02/11/2019, 1:07 PM  [facility] [address] [place], [place], [address] Phone: [phone]   Fax:  [phone]  Name: Alison Ford MRN: [id number] Date of Birth: Oct 08, 1955

## 2019-02-12 ENCOUNTER — Other Ambulatory Visit: Payer: Self-pay | Admitting: Internal Medicine

## 2019-02-12 NOTE — Telephone Encounter (Signed)
Please fill if patient may continue to utilize.

## 2019-02-13 ENCOUNTER — Ambulatory Visit: Payer: Medicare HMO

## 2019-02-13 ENCOUNTER — Other Ambulatory Visit: Payer: Self-pay

## 2019-02-13 DIAGNOSIS — R2689 Other abnormalities of gait and mobility: Secondary | ICD-10-CM | POA: Diagnosis not present

## 2019-02-13 DIAGNOSIS — G8929 Other chronic pain: Secondary | ICD-10-CM

## 2019-02-13 DIAGNOSIS — M6281 Muscle weakness (generalized): Secondary | ICD-10-CM | POA: Diagnosis not present

## 2019-02-13 DIAGNOSIS — M25561 Pain in right knee: Secondary | ICD-10-CM | POA: Diagnosis not present

## 2019-02-13 NOTE — Therapy (Signed)
[place], [place], [address] Phone: [phone]   Fax:  [phone]  Physical Therapy Treatment  Patient Details  Name: Alison Ford MRN: [id number] Date of Birth: August 04, 1955 Referring Provider (PT): Josiah Lobo, MD   Encounter Date: 02/13/2019  PT End of Session - 02/13/19 1226    Visit Number  4    Number of Visits  10    Date for PT Re-Evaluation  03/16/19    Authorization Type  humana    PT Start Time  1220    PT Stop Time  1250    PT Time Calculation (min)  30 min    Activity Tolerance  Patient tolerated treatment well    Behavior During Therapy  [facility] for tasks assessed/performed       Past Medical History:  Diagnosis Date  . Abnormal facial hair   . Back pain    d/t knee pain  . Headache(784.0)   . Heart murmur    slight  . HTN (hypertension)    takes Amlodipine daily and HCTZ   . Hyperlipidemia    takes Lovastatin nightly  . Joint pain   . Right knee DJD   . Weakness    left hand    Past Surgical History:  Procedure Laterality Date  . ABDOMINAL HYSTERECTOMY    . Athroscopic knee surgery     Bilateral  . CARPAL TUNNEL RELEASE Right 05/02/2016   Procedure: CARPAL TUNNEL RELEASE;  Surgeon: Meredith Pel, MD;  Location: [place];  Service: Orthopedics;  Laterality: Right;  . CHOLECYSTECTOMY    . DILATION AND CURETTAGE OF UTERUS    . EYE SURGERY Right   . SHOULDER ARTHROSCOPY WITH SUBACROMIAL DECOMPRESSION Right 05/02/2016   Procedure: RIGHT SHOULDER ARTHROSCOPY WITH SUBACROMIAL DECOMPRESSION, MINI OPEN ROTATOR CUFF TEAR REPAIR, AND RIGHT CARPAL TUNNEL RELEASE;  Surgeon: Meredith Pel, MD;  Location: [place];  Service: Orthopedics;  Laterality: Right;  . TOTAL KNEE ARTHROPLASTY Right 07/15/2012   Procedure: RIGHT TOTAL KNEE ARTHROPLASTY;  Surgeon: Lorn Junes, MD;  Location: [facility];  Service: Orthopedics;  Laterality: Right;    There were no vitals filed for this  visit.  Subjective Assessment - 02/13/19 1224    Subjective  Yesterday LT knee was giving me paoin and was not able to bend in normally.    Currently in Pain?  Yes    Pain Score  5     Pain Location  Knee    Pain Orientation  Right;Left    Pain Descriptors / Indicators  Dull;Aching    Pain Type  Chronic pain    Pain Onset  More than a month ago    Pain Frequency  Constant    Aggravating Factors   activity on feet    Pain Relieving Factors  tylenol, biofreeze                       [facility] Adult PT Treatment/Exercise - 02/13/19 0001      Knee/Hip Exercises: Aerobic   Recumbent Bike  L1 6 min      Knee/Hip Exercises: Seated   Long Arc Quad  Right;Left;20 reps    Long Arc Quad Weight  2 lbs.    Sit to [place]  without UE support;5 reps   stopped due to soreness LT knee     Knee/Hip Exercises: Supine   Bridges  20 reps    Straight Leg Raises  Left;Right;3  sets;5 reps    Straight Leg Raises Limitations  2#       Knee/Hip Exercises: Sidelying   Hip ABduction  Right;Left;20 reps    Hip ABduction Limitations  2# at knees    Clams  x 20 RT/LT  2# at knee      Knee/Hip Exercises: Prone   Hip Extension  Right;Left;15 reps    Hip Extension Limitations  2# at knees               PT Short Term Goals - 02/13/19 1306      PT SHORT TERM GOAL #1   Title  She will be independent with initial hEP    Status  Achieved        PT Long Term Goals - 01/28/19 1320      PT LONG TERM GOAL #2   Title  increase bil LE strength to >/=4+/5 to promote knee stability with standing and walking activities    Baseline  hip weakness    Time  6    Period  Weeks    Status  New      PT LONG TERM GOAL #3   Title  pt to be able to sit, stand and walk >/=30 min for functional endurance required for ADLs and walking in community    Time  6    Period  Weeks    Status  New      PT LONG TERM GOAL #4   Title  increase FOTO score to </= 43% limited to demo improvement in function     Time  6    Period  Weeks    Status  New      PT LONG TERM GOAL #5   Title  pt will be I with all HEP given as of last visit to maintain and progress current level of function    Time  6    Period  Weeks    Status  New            Plan - 02/13/19 1226    Clinical Impression Statement  Episode of LT knee pain yesterday. she reported LT knee almost bone on bone. Knee is better today. She was alittle late and  had to go early to catch bus to return home so Adding the 2# increased her effort    PT Treatment/Interventions  Gait training;Stair training;Therapeutic activities;Therapeutic exercise;Balance training;Moist Heat;Cryotherapy;Patient/family education;Manual techniques;Passive range of motion;Taping    PT Next Visit Plan  Continue with LE strength  and add bands/weight  as tolerated to exercises try yellow band for home   add LAQ for home    PT Home Exercise Plan  3 way hip supine, bridge, sit-stand    Consulted and Agree with Plan of Care  Patient       Patient will benefit from skilled therapeutic intervention in order to improve the following deficits and impairments:  Decreased range of motion, Difficulty walking, Pain, Decreased activity tolerance, Decreased strength  Visit Diagnosis: Muscle weakness (generalized)  Chronic pain of right knee  Other abnormalities of gait and mobility     Problem List Patient Active Problem List   Diagnosis Date Noted  . Chronic obstructive pulmonary disease ([place]) 05/30/2016  . Rotator cuff tear 05/02/2016  . Rotator cuff tendonitis, right 12/30/2015  . Cervicalgia 12/30/2015  . Eczema, dyshidrotic 11/18/2014  . Acne 11/18/2014  . Smoker 06/01/2014  . Right knee DJD   . Hyperlipidemia   . HTN (hypertension)  06/05/2012    Darrel Hoover PT 02/13/2019, 1:09 PM  [facility] [address] [place], [place], [address] Phone: [phone]   Fax:  [phone]  Name: Alison Ford MRN: [id number] Date of Birth: [birth year]

## 2019-02-18 ENCOUNTER — Other Ambulatory Visit: Payer: Self-pay

## 2019-02-18 ENCOUNTER — Ambulatory Visit: Payer: Medicare HMO | Attending: Orthopedic Surgery

## 2019-02-18 DIAGNOSIS — M6281 Muscle weakness (generalized): Secondary | ICD-10-CM | POA: Insufficient documentation

## 2019-02-18 DIAGNOSIS — G8929 Other chronic pain: Secondary | ICD-10-CM | POA: Diagnosis not present

## 2019-02-18 DIAGNOSIS — R2689 Other abnormalities of gait and mobility: Secondary | ICD-10-CM | POA: Insufficient documentation

## 2019-02-18 DIAGNOSIS — M25561 Pain in right knee: Secondary | ICD-10-CM | POA: Diagnosis not present

## 2019-02-18 NOTE — Patient Instructions (Signed)
LAQ x 20-30 reps daily 10#  Hold 2-3 sec

## 2019-02-18 NOTE — Therapy (Signed)
[place], [place], [address] Phone: [phone]   Fax:  [phone]  Physical Therapy Treatment  Patient Details  Name: Alison Ford MRN: [id number] Date of Birth: April 24, 1955 Referring Provider (PT): Josiah Lobo, MD   Encounter Date: 02/18/2019  PT End of Session - 02/18/19 1218    Visit Number  5    Number of Visits  10    Date for PT Re-Evaluation  03/16/19    Authorization Type  humana    PT Start Time  1215    PT Stop Time  1253    PT Time Calculation (min)  38 min    Activity Tolerance  Patient tolerated treatment well    Behavior During Therapy  [facility] for tasks assessed/performed       Past Medical History:  Diagnosis Date  . Abnormal facial hair   . Back pain    d/t knee pain  . Headache(784.0)   . Heart murmur    slight  . HTN (hypertension)    takes Amlodipine daily and HCTZ   . Hyperlipidemia    takes Lovastatin nightly  . Joint pain   . Right knee DJD   . Weakness    left hand    Past Surgical History:  Procedure Laterality Date  . ABDOMINAL HYSTERECTOMY    . Athroscopic knee surgery     Bilateral  . CARPAL TUNNEL RELEASE Right 05/02/2016   Procedure: CARPAL TUNNEL RELEASE;  Surgeon: Meredith Pel, MD;  Location: [place];  Service: Orthopedics;  Laterality: Right;  . CHOLECYSTECTOMY    . DILATION AND CURETTAGE OF UTERUS    . EYE SURGERY Right   . SHOULDER ARTHROSCOPY WITH SUBACROMIAL DECOMPRESSION Right 05/02/2016   Procedure: RIGHT SHOULDER ARTHROSCOPY WITH SUBACROMIAL DECOMPRESSION, MINI OPEN ROTATOR CUFF TEAR REPAIR, AND RIGHT CARPAL TUNNEL RELEASE;  Surgeon: Meredith Pel, MD;  Location: [place];  Service: Orthopedics;  Laterality: Right;  . TOTAL KNEE ARTHROPLASTY Right 07/15/2012   Procedure: RIGHT TOTAL KNEE ARTHROPLASTY;  Surgeon: Lorn Junes, MD;  Location: [place];  Service: Orthopedics;  Laterality: Right;    There were no vitals filed for this  visit.  Subjective Assessment - 02/18/19 1222    Subjective  RT knee a [place] swollen so feels stiff. Other wise OK    Pain Score  3     Pain Location  Knee    Pain Orientation  Right    Pain Descriptors / Indicators  Dull;Aching    Pain Type  Chronic pain    Pain Onset  More than a month ago    Pain Frequency  Constant    Aggravating Factors   activity on feet    Pain Relieving Factors  tylenol , biofreeze                       [facility] Adult PT Treatment/Exercise - 02/18/19 0001      Knee/Hip Exercises: Aerobic   Recumbent Bike  L4 5 min LE       Knee/Hip Exercises: Seated   Long Arc Quad  Right;Left;20 reps    Long Arc Quad Limitations  red band then 10# x12      Knee/Hip Exercises: Supine   Bridges  20 reps    Bridges Limitations  with red band at hips    Straight Leg Raises  Left;Right;10 reps    Straight Leg Raises Limitations  red band  Other Supine Knee/Hip Exercises  clam red band x 15      Knee/Hip Exercises: Sidelying   Hip ABduction  Right;Left;20 reps    Hip ABduction Limitations  red band    Clams  x 20 RT/LT  red band at knee      Knee/Hip Exercises: Prone   Hip Extension  Right;Left;15 reps             PT Education - 02/18/19 1305    Education Details  HEP    Person(s) Educated  Patient    Methods  Explanation;Tactile cues;Verbal cues;Handout    Comprehension  Verbalized understanding;Returned demonstration       PT Short Term Goals - 02/13/19 1306      PT SHORT TERM GOAL #1   Title  She will be independent with initial hEP    Status  Achieved        PT Long Term Goals - 02/18/19 1300      PT LONG TERM GOAL #2   Title  increase bil LE strength to >/=4+/5 to promote knee stability with standing and walking activities    Baseline  4/5    Status  Partially Met      PT LONG TERM GOAL #3   Title  pt to be able to sit, stand and walk >/=30 min for functional endurance required for ADLs and walking in community     Baseline  20-30 min per report    Status  On-going      PT LONG TERM GOAL #4   Title  increase FOTO score to </= 43% limited to demo improvement in function    Status  Unable to assess      PT LONG TERM GOAL #5   Title  pt will be I with all HEP given as of last visit to maintain and progress current level of function    Status  On-going            Plan - 02/18/19 1223    Clinical Impression Statement  no significnat changes. LT knee pain resolved. RT knee with pain medial aspect. She was able to do all the exercises with resistance and no increased pain. She would benefit from contiued strengths o will continue x 5-6 visits    PT Treatment/Interventions  Gait training;Stair training;Therapeutic activities;Therapeutic exercise;Balance training;Moist Heat;Cryotherapy;Patient/family education;Manual techniques;Passive range of motion;Taping    PT Next Visit Plan  Continue with LE strength  and add bands/weight  as tolerated to exercises try red band for home    PT Home Exercise Plan  3 way hip supine, bridge, sit-stand, LAQ  added red band to HEP She will use 10# cuff weight at home for LAQ    Consulted and Agree with Plan of Care  Patient       Patient will benefit from skilled therapeutic intervention in order to improve the following deficits and impairments:  Decreased range of motion, Difficulty walking, Pain, Decreased activity tolerance, Decreased strength  Visit Diagnosis: Muscle weakness (generalized)  Chronic pain of right knee  Other abnormalities of gait and mobility     Problem List Patient Active Problem List   Diagnosis Date Noted  . Chronic obstructive pulmonary disease ([place]) 05/30/2016  . Rotator cuff tear 05/02/2016  . Rotator cuff tendonitis, right 12/30/2015  . Cervicalgia 12/30/2015  . Eczema, dyshidrotic 11/18/2014  . Acne 11/18/2014  . Smoker 06/01/2014  . Right knee DJD   . Hyperlipidemia   . HTN (hypertension) 06/05/2012  Darrel Hoover PT  02/18/2019, 1:05 PM  [facility] [address] [place], [place], [address] Phone: [phone]   Fax:  [phone]  Name: Alison Ford MRN: [id number] Date of Birth: 11/30/1955

## 2019-02-25 ENCOUNTER — Ambulatory Visit: Payer: Medicare HMO

## 2019-02-25 ENCOUNTER — Other Ambulatory Visit: Payer: Self-pay

## 2019-02-25 DIAGNOSIS — G8929 Other chronic pain: Secondary | ICD-10-CM

## 2019-02-25 DIAGNOSIS — R2689 Other abnormalities of gait and mobility: Secondary | ICD-10-CM | POA: Diagnosis not present

## 2019-02-25 DIAGNOSIS — M6281 Muscle weakness (generalized): Secondary | ICD-10-CM | POA: Diagnosis not present

## 2019-02-25 DIAGNOSIS — M25561 Pain in right knee: Secondary | ICD-10-CM | POA: Diagnosis not present

## 2019-02-25 NOTE — Therapy (Signed)
[place], [place], [address] Phone: [phone]   Fax:  [phone]  Physical Therapy Treatment  Patient Details  Name: Alison Ford MRN: [id number] Date of Birth: 1955/02/25 Referring Provider (PT): Josiah Lobo, MD   Encounter Date: 02/25/2019  PT End of Session - 02/25/19 1224    Visit Number  6    Number of Visits  10    Date for PT Re-Evaluation  03/16/19    Authorization Type  humana    PT Start Time  1215    PT Stop Time  1253    PT Time Calculation (min)  38 min    Activity Tolerance  Patient tolerated treatment well    Behavior During Therapy  [facility] for tasks assessed/performed       Past Medical History:  Diagnosis Date  . Abnormal facial hair   . Back pain    d/t knee pain  . Headache(784.0)   . Heart murmur    slight  . HTN (hypertension)    takes Amlodipine daily and HCTZ   . Hyperlipidemia    takes Lovastatin nightly  . Joint pain   . Right knee DJD   . Weakness    left hand    Past Surgical History:  Procedure Laterality Date  . ABDOMINAL HYSTERECTOMY    . Athroscopic knee surgery     Bilateral  . CARPAL TUNNEL RELEASE Right 05/02/2016   Procedure: CARPAL TUNNEL RELEASE;  Surgeon: Meredith Pel, MD;  Location: [place];  Service: Orthopedics;  Laterality: Right;  . CHOLECYSTECTOMY    . DILATION AND CURETTAGE OF UTERUS    . EYE SURGERY Right   . SHOULDER ARTHROSCOPY WITH SUBACROMIAL DECOMPRESSION Right 05/02/2016   Procedure: RIGHT SHOULDER ARTHROSCOPY WITH SUBACROMIAL DECOMPRESSION, MINI OPEN ROTATOR CUFF TEAR REPAIR, AND RIGHT CARPAL TUNNEL RELEASE;  Surgeon: Meredith Pel, MD;  Location: [place];  Service: Orthopedics;  Laterality: Right;  . TOTAL KNEE ARTHROPLASTY Right 07/15/2012   Procedure: RIGHT TOTAL KNEE ARTHROPLASTY;  Surgeon: Lorn Junes, MD;  Location: [place];  Service: Orthopedics;  Laterality: Right;    There were no vitals filed for this  visit.  Subjective Assessment - 02/25/19 1225    Subjective  Ok    Pain Score  3     Pain Location  Knee    Pain Orientation  Right;Left    Pain Descriptors / Indicators  Aching    Pain Type  Chronic pain    Pain Onset  More than a month ago    Pain Frequency  Constant    Aggravating Factors   acxtivity on feet    Pain Relieving Factors  tylenol, biofreeze                       [facility] Adult PT Treatment/Exercise - 02/25/19 0001      Knee/Hip Exercises: Aerobic   Recumbent Bike  L4 5 min LE /UE      Knee/Hip Exercises: Standing   Heel Raises  Both;20 reps    Hip Abduction  Right;Left;15 reps    Abduction Limitations  red band    Hip Extension  Right;Left;15 reps    Extension Limitations  red band    Functional Squat  10 reps    Functional Squat Limitations  at free motion      Knee/Hip Exercises: Seated   Long Arc Quad  Right;Left;20 reps    Long Arc  Quad Weight  8 lbs.      Knee/Hip Exercises: Supine   Bridges  20 reps    Bridges Limitations  with red band at hips             PT Education - 02/25/19 1320    Education Details  HEP    Person(s) Educated  Patient    Methods  Explanation;Tactile cues;Verbal cues;Handout    Comprehension  Returned demonstration;Verbalized understanding       PT Short Term Goals - 02/25/19 1322      PT SHORT TERM GOAL #1   Title  She will be independent with initial hEP    Status  Achieved      PT SHORT TERM GOAL #2   Title  pt to verbalize and demo techniques to reduce inflammation and swelling via RICE     Status  Achieved        PT Long Term Goals - 02/25/19 1323      PT LONG TERM GOAL #2   Title  increase bil LE strength to >/=4+/5 to promote knee stability with standing and walking activities    Baseline  4/5    Status  Partially Met            Plan - 02/25/19 1224    Clinical Impression Statement  She eports she is feeling better and is able t  walk better thugh SOB limits this. She is  pleased with the HEP    PT Treatment/Interventions  Gait training;Stair training;Therapeutic activities;Therapeutic exercise;Balance training;Moist Heat;Cryotherapy;Patient/family education;Manual techniques;Passive range of motion;Taping    PT Next Visit Plan  Continue with LE strength  and add bands/weight  as tolerated to exercises try red band for home    PT Home Exercise Plan  3 way hip supine, bridge, sit-stand, LAQ  added red band to HEP She will use 10# cuff weight at home for LAQ   stand march and hip ext and abduction with red band    Consulted and Agree with Plan of Care  Patient       Patient will benefit from skilled therapeutic intervention in order to improve the following deficits and impairments:  Decreased range of motion, Difficulty walking, Pain, Decreased activity tolerance, Decreased strength  Visit Diagnosis: Muscle weakness (generalized)  Chronic pain of right knee  Other abnormalities of gait and mobility     Problem List Patient Active Problem List   Diagnosis Date Noted  . Chronic obstructive pulmonary disease ([place]) 05/30/2016  . Rotator cuff tear 05/02/2016  . Rotator cuff tendonitis, right 12/30/2015  . Cervicalgia 12/30/2015  . Eczema, dyshidrotic 11/18/2014  . Acne 11/18/2014  . Smoker 06/01/2014  . Right knee DJD   . Hyperlipidemia   . HTN (hypertension) 06/05/2012    Darrel Hoover  PT 02/25/2019, 1:24 PM  [facility] [address] [place], [place], [address] Phone: [phone]   Fax:  [phone]  Name: Alison Ford MRN: [id number] Date of Birth: 1955-06-02

## 2019-02-25 NOTE — Patient Instructions (Signed)
Standing march , hip abduction , hip extension   X 10-20 reps with band daily RT/LT

## 2019-03-04 ENCOUNTER — Encounter: Payer: Medicare HMO | Admitting: Physical Therapy

## 2019-03-04 ENCOUNTER — Ambulatory Visit: Payer: Medicare HMO

## 2019-03-06 ENCOUNTER — Ambulatory Visit: Payer: Medicare HMO

## 2019-03-11 ENCOUNTER — Ambulatory Visit: Payer: Medicare HMO

## 2019-03-11 ENCOUNTER — Other Ambulatory Visit: Payer: Self-pay

## 2019-03-11 DIAGNOSIS — G8929 Other chronic pain: Secondary | ICD-10-CM | POA: Diagnosis not present

## 2019-03-11 DIAGNOSIS — M6281 Muscle weakness (generalized): Secondary | ICD-10-CM

## 2019-03-11 DIAGNOSIS — R2689 Other abnormalities of gait and mobility: Secondary | ICD-10-CM

## 2019-03-11 DIAGNOSIS — M25561 Pain in right knee: Secondary | ICD-10-CM | POA: Diagnosis not present

## 2019-03-11 NOTE — Patient Instructions (Signed)
Stand DF stretch 30 sec x 2-3- reps 2x/day

## 2019-03-11 NOTE — Therapy (Signed)
[place], [place], [address] Phone: [phone]   Fax:  [phone]  Physical Therapy Treatment  Patient Details  Name: Alison Ford MRN: [id number] Date of Birth: 1955-05-28 Referring Provider (PT): Josiah Lobo, MD   Encounter Date: 03/11/2019  PT End of Session - 03/11/19 1154    Visit Number  7    Number of Visits  10    Date for PT Re-Evaluation  03/16/19    Authorization Type  humana    PT Start Time  1147   Pt late   PT Stop Time  1225    PT Time Calculation (min)  38 min    Activity Tolerance  Patient tolerated treatment well    Behavior During Therapy  [facility] for tasks assessed/performed       Past Medical History:  Diagnosis Date  . Abnormal facial hair   . Back pain    d/t knee pain  . Headache(784.0)   . Heart murmur    slight  . HTN (hypertension)    takes Amlodipine daily and HCTZ   . Hyperlipidemia    takes Lovastatin nightly  . Joint pain   . Right knee DJD   . Weakness    left hand    Past Surgical History:  Procedure Laterality Date  . ABDOMINAL HYSTERECTOMY    . Athroscopic knee surgery     Bilateral  . CARPAL TUNNEL RELEASE Right 05/02/2016   Procedure: CARPAL TUNNEL RELEASE;  Surgeon: Meredith Pel, MD;  Location: [place];  Service: Orthopedics;  Laterality: Right;  . CHOLECYSTECTOMY    . DILATION AND CURETTAGE OF UTERUS    . EYE SURGERY Right   . SHOULDER ARTHROSCOPY WITH SUBACROMIAL DECOMPRESSION Right 05/02/2016   Procedure: RIGHT SHOULDER ARTHROSCOPY WITH SUBACROMIAL DECOMPRESSION, MINI OPEN ROTATOR CUFF TEAR REPAIR, AND RIGHT CARPAL TUNNEL RELEASE;  Surgeon: Meredith Pel, MD;  Location: [place];  Service: Orthopedics;  Laterality: Right;  . TOTAL KNEE ARTHROPLASTY Right 07/15/2012   Procedure: RIGHT TOTAL KNEE ARTHROPLASTY;  Surgeon: Lorn Junes, MD;  Location: [place];  Service: Orthopedics;  Laterality: Right;    There were no vitals filed for  this visit.  Subjective Assessment - 03/11/19 1153    Subjective  No pain except on bottom RT foot when twisted on steps    Currently in Pain?  No/denies                       [facility] Adult PT Treatment/Exercise - 03/11/19 0001      Knee/Hip Exercises: Aerobic   Recumbent Bike  L4 5 min LE /UE      Knee/Hip Exercises: Standing   Heel Raises  Both;20 reps    Hip Flexion  Right;Left;15 reps;Knee bent    Hip Flexion Limitations  red band    Hip Abduction  Right;Left;20 reps    Abduction Limitations  red band    Hip Extension  Right;Left;20 reps;Knee straight    Extension Limitations  red band    Functional Squat  10 reps    Functional Squat Limitations  at free motion      Knee/Hip Exercises: Supine   Bridges  20 reps    Bridges Limitations  with red band at hips    Straight Leg Raises  Right;Left;15 reps    Straight Leg Raises Limitations  red band     Other Supine Knee/Hip Exercises  clam red band x  20             PT Education - 03/11/19 1231    Education Details  HEP    Person(s) Educated  Patient    Methods  Explanation;Demonstration;Verbal cues;Handout    Comprehension  Verbalized understanding;Returned demonstration       PT Short Term Goals - 02/25/19 1322      PT SHORT TERM GOAL #1   Title  She will be independent with initial hEP    Status  Achieved      PT SHORT TERM GOAL #2   Title  pt to verbalize and demo techniques to reduce inflammation and swelling via RICE     Status  Achieved        PT Long Term Goals - 02/25/19 1323      PT LONG TERM GOAL #2   Title  increase bil LE strength to >/=4+/5 to promote knee stability with standing and walking activities    Baseline  4/5    Status  Partially Met            Plan - 03/11/19 1155    Clinical Impression Statement  no pain post.  Need to add green band for exercise . Continue x 2 then dicharge.    PT Treatment/Interventions  Gait training;Stair training;Therapeutic  activities;Therapeutic exercise;Balance training;Moist Heat;Cryotherapy;Patient/family education;Manual techniques;Passive range of motion;Taping    PT Next Visit Plan  Continue with LE strength  and add bands/weight  as tolerated to exercises try red band for home    PT Home Exercise Plan  3 way hip supine, bridge, sit-stand, LAQ  added red band to HEP She will use 10# cuff weight at home for LAQ   stand march and hip ext and abduction with red band    Consulted and Agree with Plan of Care  Patient       Patient will benefit from skilled therapeutic intervention in order to improve the following deficits and impairments:  Decreased range of motion, Difficulty walking, Pain, Decreased activity tolerance, Decreased strength  Visit Diagnosis: Muscle weakness (generalized)  Chronic pain of right knee  Other abnormalities of gait and mobility     Problem List Patient Active Problem List   Diagnosis Date Noted  . Chronic obstructive pulmonary disease ([place]) 05/30/2016  . Rotator cuff tear 05/02/2016  . Rotator cuff tendonitis, right 12/30/2015  . Cervicalgia 12/30/2015  . Eczema, dyshidrotic 11/18/2014  . Acne 11/18/2014  . Smoker 06/01/2014  . Right knee DJD   . Hyperlipidemia   . HTN (hypertension) 06/05/2012    Darrel Hoover  PT 03/11/2019, 12:33 PM  [facility] [address] [place], [place], [address] Phone: [phone]   Fax:  [phone]  Name: Alison Ford MRN: [id number] Date of Birth: 07/28/1955

## 2019-03-13 ENCOUNTER — Other Ambulatory Visit: Payer: Self-pay

## 2019-03-13 ENCOUNTER — Ambulatory Visit: Payer: Medicare HMO

## 2019-03-13 DIAGNOSIS — M25561 Pain in right knee: Secondary | ICD-10-CM

## 2019-03-13 DIAGNOSIS — G8929 Other chronic pain: Secondary | ICD-10-CM

## 2019-03-13 DIAGNOSIS — M6281 Muscle weakness (generalized): Secondary | ICD-10-CM

## 2019-03-13 DIAGNOSIS — R2689 Other abnormalities of gait and mobility: Secondary | ICD-10-CM

## 2019-03-13 NOTE — Therapy (Signed)
[place], [place], [address] Phone: [phone]   Fax:  [phone]  Physical Therapy Treatment/Discharge  Patient Details  Name: Alison Ford MRN: [id number] Date of Birth: 24-Nov-1955 Referring Provider (PT): Josiah Lobo, MD   Encounter Date: 03/13/2019  PT End of Session - 03/13/19 1141    Visit Number  8    Number of Visits  10    Date for PT Re-Evaluation  03/16/19    PT Start Time  1115    PT Stop Time  1200    PT Time Calculation (min)  45 min    Activity Tolerance  Patient tolerated treatment well    Behavior During Therapy  [facility] for tasks assessed/performed       Past Medical History:  Diagnosis Date  . Abnormal facial hair   . Back pain    d/t knee pain  . Headache(784.0)   . Heart murmur    slight  . HTN (hypertension)    takes Amlodipine daily and HCTZ   . Hyperlipidemia    takes Lovastatin nightly  . Joint pain   . Right knee DJD   . Weakness    left hand    Past Surgical History:  Procedure Laterality Date  . ABDOMINAL HYSTERECTOMY    . Athroscopic knee surgery     Bilateral  . CARPAL TUNNEL RELEASE Right 05/02/2016   Procedure: CARPAL TUNNEL RELEASE;  Surgeon: Meredith Pel, MD;  Location: [place];  Service: Orthopedics;  Laterality: Right;  . CHOLECYSTECTOMY    . DILATION AND CURETTAGE OF UTERUS    . EYE SURGERY Right   . SHOULDER ARTHROSCOPY WITH SUBACROMIAL DECOMPRESSION Right 05/02/2016   Procedure: RIGHT SHOULDER ARTHROSCOPY WITH SUBACROMIAL DECOMPRESSION, MINI OPEN ROTATOR CUFF TEAR REPAIR, AND RIGHT CARPAL TUNNEL RELEASE;  Surgeon: Meredith Pel, MD;  Location: [place];  Service: Orthopedics;  Laterality: Right;  . TOTAL KNEE ARTHROPLASTY Right 07/15/2012   Procedure: RIGHT TOTAL KNEE ARTHROPLASTY;  Surgeon: Lorn Junes, MD;  Location: [place];  Service: Orthopedics;  Laterality: Right;    There were no vitals filed for this visit.      [facility] PT  Assessment - 03/13/19 0001      Observation/Other Assessments   Focus on Therapeutic Outcomes (FOTO)   42% limited met expectations       MMT Bilateral hips 4+/5             [facility] Adult PT Treatment/Exercise - 03/13/19 0001      Knee/Hip Exercises: Aerobic   Recumbent Bike  L4 5 min LE /UE      Knee/Hip Exercises: Seated   Long Arc Quad  Right;Left;20 reps    Long Arc Quad Weight  8 lbs.      Knee/Hip Exercises: Supine   Bridges  20 reps    Bridges Limitations  with green band at hips    Straight Leg Raises  Right;Left;15 reps    Straight Leg Raises Limitations  green band     Other Supine Knee/Hip Exercises  clam green band x 20      Knee/Hip Exercises: Sidelying   Hip ABduction  Right;Left;20 reps    Hip ABduction Limitations  green bsnd               PT Short Term Goals - 02/25/19 1322      PT SHORT TERM GOAL #1   Title  She will be independent with initial hEP  Status  Achieved      PT SHORT TERM GOAL #2   Title  pt to verbalize and demo techniques to reduce inflammation and swelling via RICE     Status  Achieved        PT Long Term Goals - 03/13/19 1256      PT LONG TERM GOAL #2   Title  increase bil LE strength to >/=4+/5 to promote knee stability with standing and walking activities    Baseline  4+/5 hips  and quads    Status  Achieved      PT LONG TERM GOAL #3   Title  pt to be able to sit, stand and walk >/=30 min for functional endurance required for ADLs and walking in community    Baseline  20-30 min per report    Status  Partially Met      PT LONG TERM GOAL #4   Title  increase FOTO score to </= 43% limited to demo improvement in function    Baseline  43% today    Status  Achieved      PT LONG TERM GOAL #5   Title  pt will be I with all HEP given as of last visit to maintain and progress current level of function    Status  Achieved            Plan - 03/13/19 1142    Clinical Impression Statement  REady for  discharge with HEP. Her pain and function is measureably improved. If she maintins her HEP she should do well    PT Treatment/Interventions  Financial trader;Therapeutic activities;Therapeutic exercise;Balance training;Moist Heat;Cryotherapy;Patient/family education;Manual techniques;Passive range of motion;Taping    PT Next Visit Plan  discharge with HEP    PT Home Exercise Plan  3 way hip supine, bridge, sit-stand, LAQ  added red band to HEP She will use 10# cuff weight at home for LAQ   stand march and hip ext and abduction with red band    Consulted and Agree with Plan of Care  Patient       Patient will benefit from skilled therapeutic intervention in order to improve the following deficits and impairments:  Decreased range of motion, Difficulty walking, Pain, Decreased activity tolerance, Decreased strength  Visit Diagnosis: Muscle weakness (generalized)  Chronic pain of right knee  Other abnormalities of gait and mobility     Problem List Patient Active Problem List   Diagnosis Date Noted  . Chronic obstructive pulmonary disease ([place]) 05/30/2016  . Rotator cuff tear 05/02/2016  . Rotator cuff tendonitis, right 12/30/2015  . Cervicalgia 12/30/2015  . Eczema, dyshidrotic 11/18/2014  . Acne 11/18/2014  . Smoker 06/01/2014  . Right knee DJD   . Hyperlipidemia   . HTN (hypertension) 06/05/2012    Darrel Hoover  PT 03/13/2019, 12:59 PM  [place] [facility] [address] [place], [place], [address] Phone: [phone]   Fax:  [phone]  Name: Alison Ford MRN: [id number] Date of Birth: Feb 02, 1955 PHYSICAL THERAPY DISCHARGE SUMMARY  Visits from Start of Care: 8  Current functional level related to goals / functional outcomes: See above   Remaining deficits: See above . Continues intermitant knee pain bilaterally but significantly less and tolerates more activity   Education /  Equipment: HEP Plan: Patient agrees to discharge.  Patient goals were met. Patient is being discharged due to being pleased with the current functional level.  ?????    Pearson Forster PT  03/13/19 

## 2019-03-18 ENCOUNTER — Other Ambulatory Visit: Payer: Self-pay

## 2019-03-18 ENCOUNTER — Ambulatory Visit: Payer: Medicare HMO | Attending: Orthopedic Surgery

## 2019-03-18 DIAGNOSIS — M6281 Muscle weakness (generalized): Secondary | ICD-10-CM | POA: Insufficient documentation

## 2019-03-18 NOTE — Therapy (Signed)
[place], [place], [address] Phone: [phone]   Fax:  [phone]  Physical Therapy Treatment/Last session Discharge  Patient Details  Name: AMBERA Ford MRN: [id number] Date of Birth: 1955/10/19 Referring Provider (PT): Josiah Lobo, MD   Encounter Date: 03/18/2019  PT End of Session - 03/18/19 1228    Visit Number  9    Number of Visits  10    Authorization Type  humana    PT Start Time  1212    PT Stop Time  1250    PT Time Calculation (min)  38 min    Activity Tolerance  Patient tolerated treatment well    Behavior During Therapy  [facility] for tasks assessed/performed       Past Medical History:  Diagnosis Date  . Abnormal facial hair   . Back pain    d/t knee pain  . Headache(784.0)   . Heart murmur    slight  . HTN (hypertension)    takes Amlodipine daily and HCTZ   . Hyperlipidemia    takes Lovastatin nightly  . Joint pain   . Right knee DJD   . Weakness    left hand    Past Surgical History:  Procedure Laterality Date  . ABDOMINAL HYSTERECTOMY    . Athroscopic knee surgery     Bilateral  . CARPAL TUNNEL RELEASE Right 05/02/2016   Procedure: CARPAL TUNNEL RELEASE;  Surgeon: Meredith Pel, MD;  Location: [place];  Service: Orthopedics;  Laterality: Right;  . CHOLECYSTECTOMY    . DILATION AND CURETTAGE OF UTERUS    . EYE SURGERY Right   . SHOULDER ARTHROSCOPY WITH SUBACROMIAL DECOMPRESSION Right 05/02/2016   Procedure: RIGHT SHOULDER ARTHROSCOPY WITH SUBACROMIAL DECOMPRESSION, MINI OPEN ROTATOR CUFF TEAR REPAIR, AND RIGHT CARPAL TUNNEL RELEASE;  Surgeon: Meredith Pel, MD;  Location: [place];  Service: Orthopedics;  Laterality: Right;  . TOTAL KNEE ARTHROPLASTY Right 07/15/2012   Procedure: RIGHT TOTAL KNEE ARTHROPLASTY;  Surgeon: Lorn Junes, MD;  Location: [place];  Service: Orthopedics;  Laterality: Right;    There were no vitals filed for this visit.  Subjective  Assessment - 03/18/19 1226    Subjective  Leg swollen this weekend but better now    Currently in Pain?  No/denies                       [facility] Adult PT Treatment/Exercise - 03/18/19 0001      Knee/Hip Exercises: Seated   Long Arc Quad Weight  6 lbs.    Long [employer] Limitations  30 repw RT nand LT      Knee/Hip Exercises: Supine   Bridges Limitations  with green band at hips  25 reps    Straight Leg Raises  Right;Left;15 reps    Straight Leg Raises Limitations  green band     Other Supine Knee/Hip Exercises  clam green band x 20      Knee/Hip Exercises: Sidelying   Hip ABduction  Right;Left;20 reps    Hip ABduction Limitations  green bsnd               PT Short Term Goals - 02/25/19 1322      PT SHORT TERM GOAL #1   Title  She will be independent with initial hEP    Status  Achieved      PT SHORT TERM GOAL #2   Title  pt to verbalize  and demo techniques to reduce inflammation and swelling via RICE     Status  Achieved        PT Long Term Goals - 03/13/19 1256      PT LONG TERM GOAL #2   Title  increase bil LE strength to >/=4+/5 to promote knee stability with standing and walking activities    Baseline  4+/5 hips  and quads    Status  Achieved      PT LONG TERM GOAL #3   Title  pt to be able to sit, stand and walk >/=30 min for functional endurance required for ADLs and walking in community    Baseline  20-30 min per report    Status  Partially Met      PT LONG TERM GOAL #4   Title  increase FOTO score to </= 43% limited to demo improvement in function    Baseline  43% today    Status  Achieved      PT LONG TERM GOAL #5   Title  pt will be I with all HEP given as of last visit to maintain and progress current level of function    Status  Achieved            Plan - 03/18/19 1229    Clinical Impression Statement  She mistakenly came today as she comes on bus gave her a session  before bus returns/   She is ready for discharge     PT Next Visit Plan  discharge with HEP    PT Home Exercise Plan  3 way hip supine, bridge, sit-stand, LAQ  added red band to HEP She will use 10# cuff weight at home for LAQ   stand march and hip ext and abduction with red band    Consulted and Agree with Plan of Care  Patient       Patient will benefit from skilled therapeutic intervention in order to improve the following deficits and impairments:     Visit Diagnosis: Muscle weakness (generalized)     Problem List Patient Active Problem List   Diagnosis Date Noted  . Chronic obstructive pulmonary disease ([place]) 05/30/2016  . Rotator cuff tear 05/02/2016  . Rotator cuff tendonitis, right 12/30/2015  . Cervicalgia 12/30/2015  . Eczema, dyshidrotic 11/18/2014  . Acne 11/18/2014  . Smoker 06/01/2014  . Right knee DJD   . Hyperlipidemia   . HTN (hypertension) 06/05/2012    Darrel Hoover  PT 03/18/2019, 12:31 PM  [facility] [address] [place], [place], [address] Phone: [phone]   Fax:  [phone]  Name: Alison Ford MRN: [id number] Date of Birth: [birth year]

## 2019-04-28 DIAGNOSIS — K5900 Constipation, unspecified: Secondary | ICD-10-CM | POA: Diagnosis not present

## 2019-04-28 DIAGNOSIS — R142 Eructation: Secondary | ICD-10-CM | POA: Diagnosis not present

## 2019-04-28 DIAGNOSIS — R143 Flatulence: Secondary | ICD-10-CM | POA: Diagnosis not present

## 2019-04-28 DIAGNOSIS — F5089 Other specified eating disorder: Secondary | ICD-10-CM | POA: Diagnosis not present

## 2019-04-28 DIAGNOSIS — J449 Chronic obstructive pulmonary disease, unspecified: Secondary | ICD-10-CM | POA: Diagnosis not present

## 2019-04-28 DIAGNOSIS — R140 Abdominal distension (gaseous): Secondary | ICD-10-CM | POA: Diagnosis not present

## 2019-04-28 DIAGNOSIS — Z1211 Encounter for screening for malignant neoplasm of colon: Secondary | ICD-10-CM | POA: Diagnosis not present

## 2019-04-29 ENCOUNTER — Ambulatory Visit: Payer: Medicare HMO | Attending: Internal Medicine | Admitting: Internal Medicine

## 2019-04-29 ENCOUNTER — Encounter: Payer: Self-pay | Admitting: Internal Medicine

## 2019-04-29 ENCOUNTER — Other Ambulatory Visit: Payer: Self-pay

## 2019-04-29 VITALS — Ht 67.5 in

## 2019-04-29 DIAGNOSIS — J449 Chronic obstructive pulmonary disease, unspecified: Secondary | ICD-10-CM | POA: Diagnosis not present

## 2019-04-29 DIAGNOSIS — I10 Essential (primary) hypertension: Secondary | ICD-10-CM | POA: Diagnosis not present

## 2019-04-29 DIAGNOSIS — E669 Obesity, unspecified: Secondary | ICD-10-CM | POA: Diagnosis not present

## 2019-04-29 DIAGNOSIS — F17200 Nicotine dependence, unspecified, uncomplicated: Secondary | ICD-10-CM | POA: Diagnosis not present

## 2019-04-29 DIAGNOSIS — D649 Anemia, unspecified: Secondary | ICD-10-CM

## 2019-04-29 DIAGNOSIS — E559 Vitamin D deficiency, unspecified: Secondary | ICD-10-CM | POA: Diagnosis not present

## 2019-04-29 DIAGNOSIS — R0602 Shortness of breath: Secondary | ICD-10-CM | POA: Diagnosis not present

## 2019-04-29 MED ORDER — PREDNISONE 20 MG PO TABS: 20.0000 mg | ORAL_TABLET | Freq: Every day | ORAL | 0 refills | Status: DC

## 2019-04-29 MED ORDER — CETIRIZINE HCL 10 MG PO TABS: 10.0000 mg | ORAL_TABLET | Freq: Every day | ORAL | 6 refills | Status: DC

## 2019-04-29 NOTE — Progress Notes (Signed)
Virtual Visit via Telephone Note Due to current restrictions/limitations of in-office visits due to the COVID-19 pandemic, this scheduled clinical appointment was converted to a telehealth visit  I connected with Alison Ford on 04/29/19 at 1:41 p.m by telephone and verified that I am speaking with the correct person using two identifiers. I am in my office.  The patient is at home.  Only the patient and myself participated in this encounter.  I discussed the limitations, risks, security and privacy concerns of performing an evaluation and management service by telephone and the availability of in person appointments. I also discussed with the patient that there may be a patient responsible charge related to this service. The patient expressed understanding and agreed to proceed.   History of Present Illness: Pt with hx of HTN, HL, tob dep, COPD, Vit D def and s/p RT rotator cuff repair and RT CTS release, LT OA knee. Last eval 12/2018  RT knee: she did the P.T.  It helped but feels she will still need the knee replacement surgery.  She plans to get back in with ortho  C/o of feeling tired and SOB.  Eating a lot of ice.   New anemia on last labs done 12/2018.  COPD: coughing a [place] more since spring. Some wheezing but not as much as before. Using Symbicort BID.  Uses albuterol MDI about 2-3 x/wk. having to use her nebulizer every night over the past 2 weeks.  Prior to this she is to use it about twice a week.  Feels SOB at nights. Gets better after neb treatment.  Sleeps on 1 pillow.  No LE edema. No fever.  She wants to be evaluated to see whether she needs supplemental oxygen.  She endorses Bassette postnasal drip and sneezing.  Requests refill on Zyrtec. -still smoking 5-7 cig/day.  Called 1-800-Quit Now and was given appt but did not keep phone appt so did not get the patches.she feels the only way she will quit smoking is if she needs supplemental oxygen.  She thinks that would force her  to quit.    HTN:  No device to check BP.  However, she reports that her BP at GI visit yesterday was 126/67 Compliant with meds and salt restriction SOB as stated above.  No CP or lower extremity edema.  No headaches or dizziness.  Saw [facility] yesterday.  Told she does not need c-scope at this time as last one 2017 only hyperplastic poly on pathology.  However she said she was told she may need one if she is still anemic on repeat blood tests.  No blood in stools   -wgh was 204 lbs up 23 lbs from 1 yr ago. Admits that eating habits poor. Snaking a lot at nights  Vit D def: Vitamin D level was 24 on last blood test.  I recommended that she purchase vitamin D 400 IU over-the-counter and take 2 daily.  However she tells me she has 2000 IU and has been taking 1 twice a day.    Outpatient Encounter Medications as of 04/29/2019  Medication Sig  . albuterol (PROVENTIL) (2.5 MG/3ML) 0.083% nebulizer solution Take 3 mLs (2.5 mg total) by nebulization every 6 (six) hours as needed for wheezing or shortness of breath.  Marland Kitchen albuterol (VENTOLIN HFA) 108 (90 Base) MCG/ACT inhaler INHALE 2 PUFFS BY MOUTH EVERY 6 HOURS AS NEEDED FOR WHEEZING FOR SHORTNESS OF BREATH  . amLODipine (NORVASC) 10 MG tablet Take 1 tablet (10 mg total)  by mouth daily.  Marland Kitchen aspirin EC 81 MG tablet Take 1 tablet (81 mg total) by mouth daily.  . budesonide-formoterol (SYMBICORT) 80-4.5 MCG/ACT inhaler Inhale 2 puffs into the lungs 2 (two) times daily.  . cetirizine (ZYRTEC) 10 MG tablet Take 1 tablet (10 mg total) by mouth daily.  . clotrimazole-betamethasone (LOTRISONE) cream APPLY  CREAM TOPICALLY TWICE DAILY  . cromolyn (OPTICROM) 4 % ophthalmic solution Place 1 drop into both eyes 4 (four) times daily.  Marland Kitchen lisinopril-hydrochlorothiazide (ZESTORETIC) 20-25 MG tablet Take 1 tablet by mouth daily.  Marland Kitchen lovastatin (MEVACOR) 40 MG tablet TAKE 1 TABLET BY MOUTH AT BEDTIME.  . polyethylene glycol powder (GLYCOLAX/MIRALAX) 17 GM/SCOOP powder  Take 17 g by mouth daily. (Patient not taking: Reported on 01/28/2019)  . promethazine-dextromethorphan (PROMETHAZINE-DM) 6.25-15 MG/5ML syrup 2.5-64ml every 4-6 hours prn cough (Patient not taking: Reported on 12/20/2018)   No facility-administered encounter medications on file as of 04/29/2019.    Observations/Objective:  Results for orders placed or performed in visit on 12/25/18  VITAMIN D 25 Hydroxy (Vit-D Deficiency, Fractures)  Result Value Ref Range   Vit D, 25-Hydroxy 24.1 (L) 30.0 - 100.0 ng/mL  Lipid panel  Result Value Ref Range   Cholesterol, Total 195 100 - 199 mg/dL   Triglycerides 95 0 - 149 mg/dL   HDL 69 >39 mg/dL   VLDL Cholesterol Cal 17 5 - 40 mg/dL   LDL Chol Calc (NIH) 109 (H) 0 - 99 mg/dL   Chol/HDL Ratio 2.8 0.0 - 4.4 ratio  Comprehensive metabolic panel  Result Value Ref Range   Glucose 94 65 - 99 mg/dL   BUN 11 8 - 27 mg/dL   Creatinine, Ser 0.79 0.57 - 1.00 mg/dL   GFR calc non Af Amer 80 >59 mL/min/1.73   GFR calc Af Amer 92 >59 mL/min/1.73   BUN/Creatinine Ratio 14 12 - 28   Sodium 138 134 - 144 mmol/L   Potassium 4.2 3.5 - 5.2 mmol/L   Chloride 102 96 - 106 mmol/L   CO2 22 20 - 29 mmol/L   Calcium 9.6 8.7 - 10.3 mg/dL   Total Protein 7.0 6.0 - 8.5 g/dL   Albumin 4.4 3.8 - 4.8 g/dL   Globulin, Total 2.6 1.5 - 4.5 g/dL   Albumin/Globulin Ratio 1.7 1.2 - 2.2   Bilirubin Total 0.2 0.0 - 1.2 mg/dL   Alkaline Phosphatase 90 39 - 117 IU/L   AST 21 0 - 40 IU/L   ALT 17 0 - 32 IU/L  CBC  Result Value Ref Range   WBC 5.6 3.4 - 10.8 x10E3/uL   RBC 4.42 3.77 - 5.28 x10E6/uL   Hemoglobin 10.7 (L) 11.1 - 15.9 g/dL   Hematocrit 34.7 34.0 - 46.6 %   MCV 79 79 - 97 fL   MCH 24.2 (L) 26.6 - 33.0 pg   MCHC 30.8 (L) 31.5 - 35.7 g/dL   RDW 15.4 11.7 - 15.4 %   Platelets 294 150 - 450 x10E3/uL    Assessment and Plan: 1. Chronic obstructive pulmonary disease, unspecified COPD type ([place]) It sounds as though this is not well controlled or she may be having a  mild flareup.  Other possibility is that the shortness of breath is due to worsening anemia versus CHF. -I will try to get her in for an in person visit within the next week.  In the meantime I will give a 5-day course of prednisone, refill Zyrtec and check CBC/iron studies and a BNP - cetirizine (  ZYRTEC) 10 MG tablet; Take 1 tablet (10 mg total) by mouth daily.  Dispense: 30 tablet; Refill: 6 - predniSONE (DELTASONE) 20 MG tablet; Take 1 tablet (20 mg total) by mouth daily with breakfast.  Dispense: 5 tablet; Refill: 0  2. SOB (shortness of breath) See #1 above - Brain natriuretic peptide; Future  3. Essential hypertension At goal.  Continue current medications and low-salt diet  4. Normocytic anemia She has symptoms suggestive of pica.  Advised to discontinue eating ice.  Advised to come to the lab as soon as possible for CBC and iron studies - Iron, TIBC and Ferritin Panel; Future - CBC; Future  5. Tobacco dependence Strongly advised to discontinue smoking.  6. Vitamin D deficiency Advised patient that she is taking too much vitamin D.  Advised to purchase the 400 IU and take 1 daily.  I will check vitamin D level when she comes to the lab  7. Obesity (BMI 30-39.9) Patient reports increased weight.  We will screen for diabetes.  Healthy eating habits discussed and encouraged. - Hemoglobin A1c; Future   Follow Up Instructions: 1-2 wks   I discussed the assessment and treatment plan with the patient. The patient was provided an opportunity to ask questions and all were answered. The patient agreed with the plan and demonstrated an understanding of the instructions.   The patient was advised to call back or seek an in-person evaluation if the symptoms worsen or if the condition fails to improve as anticipated.  I provided 22 minutes of non-face-to-face time during this encounter.   Karle Plumber, MD

## 2019-04-30 ENCOUNTER — Ambulatory Visit: Payer: Medicare HMO | Attending: Internal Medicine

## 2019-04-30 ENCOUNTER — Other Ambulatory Visit: Payer: Self-pay

## 2019-04-30 DIAGNOSIS — E669 Obesity, unspecified: Secondary | ICD-10-CM | POA: Diagnosis not present

## 2019-04-30 DIAGNOSIS — D649 Anemia, unspecified: Secondary | ICD-10-CM | POA: Diagnosis not present

## 2019-04-30 DIAGNOSIS — E559 Vitamin D deficiency, unspecified: Secondary | ICD-10-CM

## 2019-04-30 DIAGNOSIS — R0602 Shortness of breath: Secondary | ICD-10-CM

## 2019-05-01 ENCOUNTER — Other Ambulatory Visit: Payer: Self-pay | Admitting: Internal Medicine

## 2019-05-01 ENCOUNTER — Telehealth: Payer: Self-pay

## 2019-05-01 LAB — IRON,TIBC AND FERRITIN PANEL
Ferritin: 10 ng/mL — ABNORMAL LOW (ref 15–150)
Iron Saturation: 6 % — CL (ref 15–55)
Iron: 30 ug/dL (ref 27–139)
Total Iron Binding Capacity: 474 ug/dL — ABNORMAL HIGH (ref 250–450)
UIBC: 444 ug/dL — ABNORMAL HIGH (ref 118–369)

## 2019-05-01 LAB — VITAMIN D 25 HYDROXY (VIT D DEFICIENCY, FRACTURES): Vit D, 25-Hydroxy: 42.1 ng/mL (ref 30.0–100.0)

## 2019-05-01 LAB — BRAIN NATRIURETIC PEPTIDE: BNP: 42.3 pg/mL (ref 0.0–100.0)

## 2019-05-01 LAB — CBC
Hematocrit: 33.5 % — ABNORMAL LOW (ref 34.0–46.6)
Hemoglobin: 10.1 g/dL — ABNORMAL LOW (ref 11.1–15.9)
MCH: 24 pg — ABNORMAL LOW (ref 26.6–33.0)
MCHC: 30.1 g/dL — ABNORMAL LOW (ref 31.5–35.7)
MCV: 80 fL (ref 79–97)
Platelets: 317 10*3/uL (ref 150–450)
RBC: 4.2 x10E6/uL (ref 3.77–5.28)
RDW: 15.1 % (ref 11.7–15.4)
WBC: 6.2 10*3/uL (ref 3.4–10.8)

## 2019-05-01 LAB — HEMOGLOBIN A1C
Est. average glucose Bld gHb Est-mCnc: 126 mg/dL
Hgb A1c MFr Bld: 6 % — ABNORMAL HIGH (ref 4.8–5.6)

## 2019-05-01 MED ORDER — FERROUS SULFATE 325 (65 FE) MG PO TABS: 325.0000 mg | ORAL_TABLET | Freq: Every day | ORAL | 1 refills | Status: DC

## 2019-05-01 NOTE — Telephone Encounter (Signed)
Contacted pt to go over lab results pt didn't answer lvm asking pt to give a call back at her earliest convenience  

## 2019-05-01 NOTE — Progress Notes (Signed)
Let patient know that her vitamin D level came back good.  She is in the range for prediabetes.  We will discuss management of this when I see her on her in person visit.  She has iron deficiency anemia.  She should stop eating ice.  I have sent a prescription to her pharmacy for iron supplement for which she should take 1 daily. Is her breathing any better on the Prednisone?

## 2019-05-02 ENCOUNTER — Telehealth: Payer: Self-pay | Admitting: Internal Medicine

## 2019-05-02 ENCOUNTER — Telehealth: Payer: Self-pay

## 2019-05-02 NOTE — Telephone Encounter (Signed)
Tried contacting pt pt didn't answer °

## 2019-05-02 NOTE — Telephone Encounter (Signed)
Contacted pt to go over lab results pt didn't answer lvm asking pt to give a call back at her earliest convenience. This is the second attempt to reach pt for results and no success. Mailed pt letter today

## 2019-05-02 NOTE — Telephone Encounter (Signed)
Pt. Returned nurse call regarding results. Please f/u

## 2019-05-13 ENCOUNTER — Telehealth: Payer: Self-pay | Admitting: Internal Medicine

## 2019-05-13 NOTE — Telephone Encounter (Signed)
Patient called regarding her lab results. Please f/u

## 2019-05-13 NOTE — Telephone Encounter (Signed)
Returned pt call to go over lab results. Pt is aware and the prednisone did help a [place]

## 2019-05-15 ENCOUNTER — Encounter: Payer: Self-pay | Admitting: Internal Medicine

## 2019-05-15 ENCOUNTER — Ambulatory Visit: Payer: Medicare HMO | Attending: Internal Medicine | Admitting: Internal Medicine

## 2019-05-15 ENCOUNTER — Other Ambulatory Visit: Payer: Self-pay

## 2019-05-15 VITALS — BP 127/68 | HR 79 | Temp 96.8°F | Resp 16 | Wt 202.8 lb

## 2019-05-15 DIAGNOSIS — R7303 Prediabetes: Secondary | ICD-10-CM | POA: Diagnosis not present

## 2019-05-15 DIAGNOSIS — E6609 Other obesity due to excess calories: Secondary | ICD-10-CM

## 2019-05-15 DIAGNOSIS — Z6831 Body mass index (BMI) 31.0-31.9, adult: Secondary | ICD-10-CM

## 2019-05-15 DIAGNOSIS — R011 Cardiac murmur, unspecified: Secondary | ICD-10-CM | POA: Diagnosis not present

## 2019-05-15 DIAGNOSIS — F17210 Nicotine dependence, cigarettes, uncomplicated: Secondary | ICD-10-CM

## 2019-05-15 DIAGNOSIS — D509 Iron deficiency anemia, unspecified: Secondary | ICD-10-CM

## 2019-05-15 DIAGNOSIS — J449 Chronic obstructive pulmonary disease, unspecified: Secondary | ICD-10-CM | POA: Diagnosis not present

## 2019-05-15 DIAGNOSIS — F17200 Nicotine dependence, unspecified, uncomplicated: Secondary | ICD-10-CM | POA: Diagnosis not present

## 2019-05-15 DIAGNOSIS — R0902 Hypoxemia: Secondary | ICD-10-CM | POA: Diagnosis not present

## 2019-05-15 MED ORDER — METFORMIN HCL 500 MG PO TABS: 250.0000 mg | ORAL_TABLET | Freq: Every day | ORAL | 3 refills | Status: DC

## 2019-05-15 NOTE — Patient Instructions (Signed)
Use Oxygen 1-2 liters when walking. You have been referred to a lung specialist. Call 1800-Quit now to get the nicotine patches for free.   Prediabetes Eating Plan Prediabetes is a condition that causes blood sugar (glucose) levels to be higher than normal. This increases the risk for developing diabetes. In order to prevent diabetes from developing, your health care provider may recommend a diet and other lifestyle changes to help you:  Control your blood glucose levels.  Improve your cholesterol levels.  Manage your blood pressure. Your health care provider may recommend working with a diet and nutrition specialist (dietitian) to make a meal plan that is best for you. What are tips for following this plan? Lifestyle  Set weight loss goals with the help of your health care team. It is recommended that most people with prediabetes lose 7% of their current body weight.  Exercise for at least 30 minutes at least 5 days a week.  Attend a support group or seek ongoing support from a mental health counselor.  Take over-the-counter and prescription medicines only as told by your health care provider. Reading food labels  Read food labels to check the amount of fat, salt (sodium), and sugar in prepackaged foods. Avoid foods that have: ? Saturated fats. ? Trans fats. ? Added sugars.  Avoid foods that have more than 300 milligrams (mg) of sodium per serving. Limit your daily sodium intake to less than 2,300 mg each day. Shopping  Avoid buying pre-made and processed foods. Cooking  Cook with olive oil. Do not use butter, lard, or ghee.  Bake, broil, grill, or boil foods. Avoid frying. Meal planning   Work with your dietitian to develop an eating plan that is right for you. This may include: ? Tracking how many calories you take in. Use a food diary, notebook, or mobile application to track what you eat at each meal. ? Using the glycemic index (GI) to plan your meals. The index  tells you how quickly a food will raise your blood glucose. Choose low-GI foods. These foods take a longer time to raise blood glucose.  Consider following a Mediterranean diet. This diet includes: ? Several servings each day of fresh fruits and vegetables. ? Eating fish at least twice a week. ? Several servings each day of whole grains, beans, nuts, and seeds. ? Using olive oil instead of other fats. ? Moderate alcohol consumption. ? Eating small amounts of red meat and whole-fat dairy.  If you have high blood pressure, you may need to limit your sodium intake or follow a diet such as the DASH eating plan. DASH is an eating plan that aims to lower high blood pressure. What foods are recommended? The items listed below may not be a complete list. Talk with your dietitian about what dietary choices are best for you. Grains Whole grains, such as whole-wheat or whole-grain breads, crackers, cereals, and pasta. Unsweetened oatmeal. Bulgur. Barley. Quinoa. Brown rice. Corn or whole-wheat flour tortillas or taco shells. Vegetables Lettuce. Spinach. Peas. Beets. Cauliflower. Cabbage. Broccoli. Carrots. Tomatoes. Squash. Eggplant. Herbs. Peppers. Onions. Cucumbers. Brussels sprouts. Fruits Berries. Bananas. Apples. Oranges. Grapes. Papaya. Mango. Pomegranate. Kiwi. Grapefruit. Cherries. Meats and other protein foods Seafood. Poultry without skin. Lean cuts of pork and beef. Tofu. Eggs. Nuts. Beans. Dairy Low-fat or fat-free dairy products, such as yogurt, cottage cheese, and cheese. Beverages Water. Tea. Coffee. Sugar-free or diet soda. Seltzer water. Lowfat or no-fat milk. Milk alternatives, such as soy or almond milk. Fats and oils  Olive oil. Canola oil. Sunflower oil. Grapeseed oil. Avocado. Walnuts. Sweets and desserts Sugar-free or low-fat pudding. Sugar-free or low-fat ice cream and other frozen treats. Seasoning and other foods Herbs. Sodium-free spices. Mustard. Relish. Low-fat,  low-sugar ketchup. Low-fat, low-sugar barbecue sauce. Low-fat or fat-free mayonnaise. What foods are not recommended? The items listed below may not be a complete list. Talk with your dietitian about what dietary choices are best for you. Grains Refined white flour and flour products, such as bread, pasta, snack foods, and cereals. Vegetables Canned vegetables. Frozen vegetables with butter or cream sauce. Fruits Fruits canned with syrup. Meats and other protein foods Fatty cuts of meat. Poultry with skin. Breaded or fried meat. Processed meats. Dairy Full-fat yogurt, cheese, or milk. Beverages Sweetened drinks, such as sweet iced tea and soda. Fats and oils Butter. Lard. Ghee. Sweets and desserts Baked goods, such as cake, cupcakes, pastries, cookies, and cheesecake. Seasoning and other foods Spice mixes with added salt. Ketchup. Barbecue sauce. Mayonnaise. Summary  To prevent diabetes from developing, you may need to make diet and other lifestyle changes to help control blood sugar, improve cholesterol levels, and manage your blood pressure.  Set weight loss goals with the help of your health care team. It is recommended that most people with prediabetes lose 7 percent of their current body weight.  Consider following a Mediterranean diet that includes plenty of fresh fruits and vegetables, whole grains, beans, nuts, seeds, fish, lean meat, low-fat dairy, and healthy oils. This information is not intended to replace advice given to you by your health care provider. Make sure you discuss any questions you have with your health care provider. Document Revised: 04/26/2018 Document Reviewed: 03/08/2016 Elsevier Patient Education  2020 [employer].

## 2019-05-15 NOTE — Progress Notes (Signed)
Patient ID: Alison Ford, female    DOB: 1955-02-20  MRN: [id number]  CC: Shortness of Breath   Subjective: Alison Ford is a 64 y.o. female who presents for f/u SOB Her concerns today include:  Pt with hx of HTN, HL, tob dep, COPD, Vit D def and s/p RT rotator cuff repair and RT CTS release, LT OA knee.  COPD:  SOB better with Prednisone.  No SOB at nights.  Since Prednisone she has not had to use the neb machine in the past 2 wks. She has not had to use the Albuterol inhaler either but does use it if she does a lot of walking.  Using the Symbicort as prescribed Still smoking 4-5 cig/day.  Plans to quit.  Wants number to call 1-800-Quit now to get the nicotine patches. She is concerned that she may need O2 therapy for when she does a lot of walking. -BNP was nl  Found to have new IDA. She stop eating ice.  No fatigue or dizziness. Not on NSAID.   Started taking iron supplement as prescribed about 1 week ago.  Stools black since starting iron supplement but prior to iron, no black stools or blood in stools. -Recently saw [facility].  She is on Linzess for chronic constipation.  Her last colonoscopy was 11/2015 and plan was for repeat in 10 years.  She was advised to follow-up with them if she is anemic which was suspected based on PICA symptoms that she reported to them when she was seen 04/28/2019  Gain 20 lbs in past 1 yr.  She was eating a Jahnke more during the pandemic.  Recent A1C was 6 which is new prediabetes for her. Just purchased a stationary exercise bike.  Started using it twice a day for 30 mins to 1 hr.   HM:  Complete Pfizer vaccine for COVID and has a card with her today. Patient Active Problem List   Diagnosis Date Noted  . Normocytic anemia 04/29/2019  . Chronic obstructive pulmonary disease ([place]) 05/30/2016  . Rotator cuff tear 05/02/2016  . Rotator cuff tendonitis, right 12/30/2015  . Cervicalgia 12/30/2015  . Eczema, dyshidrotic  11/18/2014  . Acne 11/18/2014  . Smoker 06/01/2014  . Right knee DJD   . Hyperlipidemia   . HTN (hypertension) 06/05/2012     Current Outpatient Medications on File Prior to Visit  Medication Sig Dispense Refill  . albuterol (PROVENTIL) (2.5 MG/3ML) 0.083% nebulizer solution Take 3 mLs (2.5 mg total) by nebulization every 6 (six) hours as needed for wheezing or shortness of breath. 150 mL 1  . albuterol (VENTOLIN HFA) 108 (90 Base) MCG/ACT inhaler INHALE 2 PUFFS BY MOUTH EVERY 6 HOURS AS NEEDED FOR WHEEZING FOR SHORTNESS OF BREATH 18 g 5  . amLODipine (NORVASC) 10 MG tablet Take 1 tablet (10 mg total) by mouth daily. 90 tablet 1  . aspirin EC 81 MG tablet Take 1 tablet (81 mg total) by mouth daily. 90 tablet 3  . budesonide-formoterol (SYMBICORT) 80-4.5 MCG/ACT inhaler Inhale 2 puffs into the lungs 2 (two) times daily. 1 Inhaler 6  . cetirizine (ZYRTEC) 10 MG tablet Take 1 tablet (10 mg total) by mouth daily. 30 tablet 6  . clotrimazole-betamethasone (LOTRISONE) cream APPLY  CREAM TOPICALLY TWICE DAILY 30 g 0  . cromolyn (OPTICROM) 4 % ophthalmic solution Place 1 drop into both eyes 4 (four) times daily.  98  . ferrous sulfate 325 (65 FE) MG tablet Take 1 tablet (325 mg  total) by mouth daily with breakfast. 100 tablet 1  . LINZESS 145 MCG CAPS capsule     . lisinopril-hydrochlorothiazide (ZESTORETIC) 20-25 MG tablet Take 1 tablet by mouth daily. 90 tablet 3  . lovastatin (MEVACOR) 40 MG tablet TAKE 1 TABLET BY MOUTH AT BEDTIME. 90 tablet 3  . predniSONE (DELTASONE) 20 MG tablet Take 1 tablet (20 mg total) by mouth daily with breakfast. 5 tablet 0   No current facility-administered medications on file prior to visit.    Allergies  Allergen Reactions  . Penicillins Itching and Other (See Comments)    Burning and whelps Has patient had a PCN reaction causing immediate rash, facial/tongue/throat swelling, SOB or lightheadedness with hypotension: NO Has patient had a PCN reaction causing  severe rash involving mucus membranes or skin necrosis: NO Has patient had a PCN reaction that required hospitalization: NO Has patient had a PCN reaction occurring within the last 10 years: NO If all of the above answers are "NO", then may proceed with Cephalosporin use.    Social History   Socioeconomic History  . Marital status: Legally Separated    Spouse name: Not on file  . Number of children: 3  . Years of education: Not on file  . Highest education level: Not on file  Occupational History    Employer: [id number]  Tobacco Use  . Smoking status: Current Every Day Smoker    Packs/day: 0.50  . Smokeless tobacco: Never Used  . Tobacco comment: Smoking 3-4 cigs/day  Substance and Sexual Activity  . Alcohol use: Yes    Alcohol/week: 0.0 standard drinks    Comment: ocassionally  . Drug use: No  . Sexual activity: Never  Other Topics Concern  . Not on file  Social History Narrative   Lives with daughter and two grandsons.    Social Determinants of Health   Financial Resource Strain:   . Difficulty of Paying Living Expenses:   Food Insecurity:   . Worried About Charity fundraiser in the Last Year:   . Arboriculturist in the Last Year:   Transportation Needs:   . Film/video editor (Medical):   Marland Kitchen Lack of Transportation (Non-Medical):   Physical Activity:   . Days of Exercise per Week:   . Minutes of Exercise per Session:   Stress:   . Feeling of Stress :   Social Connections:   . Frequency of Communication with Friends and Family:   . Frequency of Social Gatherings with Friends and Family:   . Attends Religious Services:   . Active Member of Clubs or Organizations:   . Attends Archivist Meetings:   Marland Kitchen Marital Status:   Intimate Partner Violence:   . Fear of Current or Ex-Partner:   . Emotionally Abused:   Marland Kitchen Physically Abused:   . Sexually Abused:     Family History  Problem Relation Age of Onset  . CAD Father 41  . CAD Mother 65  .  Hypertension Mother     Past Surgical History:  Procedure Laterality Date  . ABDOMINAL HYSTERECTOMY    . Athroscopic knee surgery     Bilateral  . CARPAL TUNNEL RELEASE Right 05/02/2016   Procedure: CARPAL TUNNEL RELEASE;  Surgeon: Meredith Pel, MD;  Location: [facility];  Service: Orthopedics;  Laterality: Right;  . CHOLECYSTECTOMY    . DILATION AND CURETTAGE OF UTERUS    . EYE SURGERY Right   . SHOULDER ARTHROSCOPY WITH SUBACROMIAL DECOMPRESSION Right 05/02/2016  Procedure: RIGHT SHOULDER ARTHROSCOPY WITH SUBACROMIAL DECOMPRESSION, MINI OPEN ROTATOR CUFF TEAR REPAIR, AND RIGHT CARPAL TUNNEL RELEASE;  Surgeon: Meredith Pel, MD;  Location: [facility];  Service: Orthopedics;  Laterality: Right;  . TOTAL KNEE ARTHROPLASTY Right 07/15/2012   Procedure: RIGHT TOTAL KNEE ARTHROPLASTY;  Surgeon: Lorn Junes, MD;  Location: [facility];  Service: Orthopedics;  Laterality: Right;    ROS: Review of Systems Negative except as stated above  PHYSICAL EXAM: BP 127/68   Pulse 79   Temp (!) 96.8 F (36 C)   Resp 16   Wt 202 lb 12.8 oz (92 kg)   SpO2 92%   BMI 31.29 kg/m   Wt Readings from Last 3 Encounters:  05/15/19 202 lb 12.8 oz (92 kg)  04/01/18 181 lb (82.1 kg)  10/31/17 182 lb 9.6 oz (82.8 kg)  Pulse ox 95-96% on room air at rest.  Pulse ox 88 to 94% on room air with ambulation.  Pulse ox on 1 L of oxygen stayed around 94% and on 2 L of oxygen stayed at 99%.  Physical Exam  General appearance - alert, well appearing, middle-aged older African-American female and in no distress Mental status - normal mood, behavior, speech, dress, motor activity, and thought processes Neck - supple, no significant adenopathy Chest -no wheezes or crackles heard.  Breath sounds are clear at rest.  Breath sounds mildly diminished post ambulation around the nurses station Heart -regular rate and rhythm.  2 out of 6 systolic ejection murmur at the apex and along the left sternal border.  No JVD   extremities -no lower extremity edema MSK she ambulates with a single prong cane.  She has valgum deformity of the knees  CMP Latest Ref Rng & Units 12/25/2018 04/19/2017 04/27/2016  Glucose 65 - 99 mg/dL 94 120(H) 91  BUN 8 - 27 mg/dL 11 14 12   Creatinine 0.57 - 1.00 mg/dL 0.79 0.79 0.64  Sodium 134 - 144 mmol/L 138 143 139  Potassium 3.5 - 5.2 mmol/L 4.2 4.3 4.1  Chloride 96 - 106 mmol/L 102 102 108  CO2 20 - 29 mmol/L 22 26 24   Calcium 8.7 - 10.3 mg/dL 9.6 10.4(H) 9.3  Total Protein 6.0 - 8.5 g/dL 7.0 6.6 -  Total Bilirubin 0.0 - 1.2 mg/dL 0.2 <0.2 -  Alkaline Phos 39 - 117 IU/L 90 71 -  AST 0 - 40 IU/L 21 18 -  ALT 0 - 32 IU/L 17 14 -   Lipid Panel     Component Value Date/Time   CHOL 195 12/25/2018 1045   TRIG 95 12/25/2018 1045   HDL 69 12/25/2018 1045   CHOLHDL 2.8 12/25/2018 1045   CHOLHDL 4.0 03/17/2014 0922   VLDL 41 (H) 03/17/2014 0922   LDLCALC 109 (H) 12/25/2018 1045    CBC    Component Value Date/Time   WBC 6.2 04/30/2019 1622   WBC 7.0 05/07/2017 1422   RBC 4.20 04/30/2019 1622   RBC 4.03 05/07/2017 1422   HGB 10.1 (L) 04/30/2019 1622   HCT 33.5 (L) 04/30/2019 1622   PLT 317 04/30/2019 1622   MCV 80 04/30/2019 1622   MCH 24.0 (L) 04/30/2019 1622   MCH 30.5 05/07/2017 1422   MCHC 30.1 (L) 04/30/2019 1622   MCHC 34.4 05/07/2017 1422   RDW 15.1 04/30/2019 1622   LYMPHSABS 3,171 05/07/2017 1422   MONOABS 448 09/29/2015 1158   EOSABS 273 05/07/2017 1422   BASOSABS 42 05/07/2017 1422   Iron/TIBC/Ferritin/ %Sat  Component Value Date/Time   IRON 30 04/30/2019 1622   TIBC 474 (H) 04/30/2019 1622   FERRITIN 10 (L) 04/30/2019 1622   IRONPCTSAT 6 (LL) 04/30/2019 1622     ASSESSMENT AND PLAN: 1. Chronic obstructive pulmonary disease, unspecified COPD type (HCC) Continue Symbicort inhaler. Strongly advised to quit smoking. Patient level drops during ambulation.  We will order portable oxygen for her to use.  Will send prescription to [facility].  Advised  patient strongly not to smoke around her oxygen tank as it can combust - Ambulatory referral to Pulmonology  2. Iron deficiency anemia, unspecified iron deficiency anemia type Send her back to the gastroenterologist to see whether she needs an endoscopy.  Encouraged her to discontinue eating ice.  Continue the iron supplement. - Ambulatory referral to Gastroenterology  3. Prediabetes Dietary counseling given.  Will refer to nutritionist for dietary counseling.  Encouraged her to continue regular exercise.  Start low-dose Metformin - metFORMIN (GLUCOPHAGE) 500 MG tablet; Take 0.5 tablets (250 mg total) by mouth daily with breakfast.  Dispense: 30 tablet; Refill: 3  4. Class 1 obesity due to excess calories with serious comorbidity and body mass index (BMI) of 31.0 to 31.9 in adult - Amb ref to Medical Nutrition Therapy-MNT  5. Systolic ejection murmur  - ECHOCARDIOGRAM COMPLETE; Future  6. Hypoxia See #1 above - Ambulatory referral to Pulmonology  7. Tobacco dependence Strongly advised to discontinue smoking.  Patient ready to give a trial of quitting.  She requested and I gave the information for 1 800 quit now.  She will plan to request the nicotine patches.  Less than 5 minutes spent on counseling.    Patient was given the opportunity to ask questions.  Patient verbalized understanding of the plan and was able to repeat key elements of the plan.   No orders of the defined types were placed in this encounter.    Requested Prescriptions    No prescriptions requested or ordered in this encounter    No follow-ups on file.  Karle Plumber, MD, FACP

## 2019-05-16 ENCOUNTER — Telehealth: Payer: Self-pay | Admitting: Internal Medicine

## 2019-05-16 NOTE — Telephone Encounter (Signed)
Estill Bamberg from [facility] called stating she could not get in touch with the patient or her son and needs a detailed written order that was sent to them. Per nurse Susette Racer it is fine to follow up next week. Estill Bamberg will fax order to be completed by PCP one received it will be put in PCP box

## 2019-06-03 ENCOUNTER — Telehealth: Payer: Self-pay | Admitting: Internal Medicine

## 2019-06-03 DIAGNOSIS — H10413 Chronic giant papillary conjunctivitis, bilateral: Secondary | ICD-10-CM | POA: Diagnosis not present

## 2019-06-03 DIAGNOSIS — H04123 Dry eye syndrome of bilateral lacrimal glands: Secondary | ICD-10-CM | POA: Diagnosis not present

## 2019-06-03 DIAGNOSIS — R7309 Other abnormal glucose: Secondary | ICD-10-CM | POA: Diagnosis not present

## 2019-06-03 DIAGNOSIS — H40013 Open angle with borderline findings, low risk, bilateral: Secondary | ICD-10-CM | POA: Diagnosis not present

## 2019-06-03 DIAGNOSIS — H2513 Age-related nuclear cataract, bilateral: Secondary | ICD-10-CM | POA: Diagnosis not present

## 2019-06-03 LAB — HM DIABETES EYE EXAM

## 2019-06-03 NOTE — Telephone Encounter (Signed)
Patient called in to check on her o2 script. Patient was informed by [facility] that they needed a new script faxed stating that it would be portable. Please follow up at your earliest convenience.

## 2019-06-03 NOTE — Telephone Encounter (Signed)
Refaxed rx that was faxed on 05/15/19. The rx that Dr. Wynetta Emery had wrote was for portable o2

## 2019-06-05 ENCOUNTER — Other Ambulatory Visit: Payer: Self-pay

## 2019-06-05 ENCOUNTER — Encounter: Payer: Medicare HMO | Attending: Internal Medicine | Admitting: Skilled Nursing Facility1

## 2019-06-05 ENCOUNTER — Encounter: Payer: Self-pay | Admitting: Skilled Nursing Facility1

## 2019-06-05 ENCOUNTER — Telehealth: Payer: Self-pay

## 2019-06-05 DIAGNOSIS — E669 Obesity, unspecified: Secondary | ICD-10-CM | POA: Diagnosis not present

## 2019-06-05 DIAGNOSIS — Z6831 Body mass index (BMI) 31.0-31.9, adult: Secondary | ICD-10-CM | POA: Insufficient documentation

## 2019-06-05 DIAGNOSIS — R7303 Prediabetes: Secondary | ICD-10-CM

## 2019-06-05 DIAGNOSIS — Z713 Dietary counseling and surveillance: Secondary | ICD-10-CM | POA: Insufficient documentation

## 2019-06-05 NOTE — Progress Notes (Signed)
  Assessment:  Primary concerns today: prediabetes.   Pt states she takes her metformin and iron supplement daily. Pt states her usual weight as an adult has been 180 pounds. Pt states she was eating 1 bag of hot fries a day. Pt state she was told she needs to take a PPI. Pt states she knows what she needs to do but likes her fried foods    MEDICATIONS: see list   DIETARY INTAKE:  Usual eating pattern includes 3 meals and 2 snacks per day.  Everyday foods include none stated.  Avoided foods include none stated.    24-hr recall:  B ( AM): coffee + flavored creamer + sugar + whole milk Snk ( AM): 3 eggs 2 packs grits 2 bacon L ( PM): walnuts Snk (2:30 PM): fried or baked chicken + rice + greens or frozen meal D (8-9 PM): chicken or apples Snk ( PM): hot fries and canned fruit Beverages: water, coffee, beer, naked juice, boost  Usual physical activity: ADL's  Estimated energy needs: 1600-1800 calories  Progress Towards Goal(s):  In progress.     Intervention:  Nutrition counseling. Dietitian educated pt on smoking and nutrient deficiencies and heart healthy diet. Goals: Wait at least 1 hour after you have had your coffee before you take your iron Start taking a multivitamin and tums every day at least 2 hours from your iron Do not fry your foods Aim to have non starchy vegetables 2 times a day 7 days a week  Teaching Method Utilized:  Visual Auditory Hands on  Handouts given during visit include:  Detailed myplate  Barriers to learning/adherence to lifestyle change: pre contemplative stage of change   Demonstrated degree of understanding via:  Teach Back   Monitoring/Evaluation:  Dietary intake, exercise, and body weight prn.

## 2019-06-05 NOTE — Telephone Encounter (Signed)
[facility] and spoke with Herbie Baltimore a client representative     CPT- [phone] Echocardiogram ICD- [id number] Systolic ejection murmur  Gave all clinical information    Authorization [id number] Effective-06/06/19 Expires- 07/06/19

## 2019-06-06 ENCOUNTER — Ambulatory Visit (HOSPITAL_COMMUNITY)
Admission: RE | Admit: 2019-06-06 | Discharge: 2019-06-06 | Disposition: A | Discharge status: Home / Self Care | Payer: Medicare HMO | Source: Ambulatory Visit | Attending: Internal Medicine | Admitting: Internal Medicine

## 2019-06-06 DIAGNOSIS — E785 Hyperlipidemia, unspecified: Secondary | ICD-10-CM | POA: Insufficient documentation

## 2019-06-06 DIAGNOSIS — I350 Nonrheumatic aortic (valve) stenosis: Secondary | ICD-10-CM | POA: Diagnosis not present

## 2019-06-06 DIAGNOSIS — R011 Cardiac murmur, unspecified: Secondary | ICD-10-CM | POA: Insufficient documentation

## 2019-06-06 DIAGNOSIS — I10 Essential (primary) hypertension: Secondary | ICD-10-CM | POA: Insufficient documentation

## 2019-06-06 DIAGNOSIS — F17200 Nicotine dependence, unspecified, uncomplicated: Secondary | ICD-10-CM | POA: Diagnosis not present

## 2019-06-06 NOTE — Progress Notes (Signed)
  Echocardiogram 2D Echocardiogram has been performed.  Kele Barthelemy A Patriece Archbold 06/06/2019, 11:55 AM

## 2019-06-07 NOTE — Progress Notes (Signed)
Let pt know that her echo revealed that her heart function is good and probably less likely to be causing her shortness of breath.

## 2019-06-09 ENCOUNTER — Telehealth: Payer: Self-pay

## 2019-06-09 DIAGNOSIS — D649 Anemia, unspecified: Secondary | ICD-10-CM | POA: Diagnosis not present

## 2019-06-09 NOTE — Telephone Encounter (Signed)
Contacted pt to go over echo results pt didn't answer left a detailed vm informing pt of results and if she has any questions or concerns to give me a call

## 2019-06-11 DIAGNOSIS — J449 Chronic obstructive pulmonary disease, unspecified: Secondary | ICD-10-CM | POA: Diagnosis not present

## 2019-06-11 DIAGNOSIS — K5900 Constipation, unspecified: Secondary | ICD-10-CM | POA: Diagnosis not present

## 2019-06-11 DIAGNOSIS — D509 Iron deficiency anemia, unspecified: Secondary | ICD-10-CM | POA: Diagnosis not present

## 2019-06-13 DIAGNOSIS — J449 Chronic obstructive pulmonary disease, unspecified: Secondary | ICD-10-CM | POA: Diagnosis not present

## 2019-07-13 ENCOUNTER — Other Ambulatory Visit: Payer: Self-pay | Admitting: Internal Medicine

## 2019-07-13 DIAGNOSIS — J449 Chronic obstructive pulmonary disease, unspecified: Secondary | ICD-10-CM

## 2019-07-13 DIAGNOSIS — I10 Essential (primary) hypertension: Secondary | ICD-10-CM

## 2019-07-13 DIAGNOSIS — E785 Hyperlipidemia, unspecified: Secondary | ICD-10-CM

## 2019-07-14 ENCOUNTER — Other Ambulatory Visit: Payer: Self-pay

## 2019-07-14 ENCOUNTER — Ambulatory Visit: Payer: Medicare HMO | Attending: Internal Medicine | Admitting: Internal Medicine

## 2019-07-14 DIAGNOSIS — F17200 Nicotine dependence, unspecified, uncomplicated: Secondary | ICD-10-CM | POA: Diagnosis not present

## 2019-07-14 DIAGNOSIS — Z9981 Dependence on supplemental oxygen: Secondary | ICD-10-CM | POA: Diagnosis not present

## 2019-07-14 DIAGNOSIS — J449 Chronic obstructive pulmonary disease, unspecified: Secondary | ICD-10-CM | POA: Diagnosis not present

## 2019-07-14 DIAGNOSIS — R7303 Prediabetes: Secondary | ICD-10-CM | POA: Diagnosis not present

## 2019-07-14 DIAGNOSIS — R0902 Hypoxemia: Secondary | ICD-10-CM | POA: Diagnosis not present

## 2019-07-14 DIAGNOSIS — D509 Iron deficiency anemia, unspecified: Secondary | ICD-10-CM

## 2019-07-14 MED ORDER — BUDESONIDE-FORMOTEROL FUMARATE 80-4.5 MCG/ACT IN AERO: 2.0000 | INHALATION_SPRAY | Freq: Two times a day (BID) | RESPIRATORY_TRACT | 6 refills | Status: DC

## 2019-07-14 NOTE — Progress Notes (Signed)
Virtual Visit via Telephone Note Due to current restrictions/limitations of in-office visits due to the COVID-19 pandemic, this scheduled clinical appointment was converted to a telehealth visit  I connected with Alison Ford on 07/14/19 at 4:21 p.m by telephone and verified that I am speaking with the correct person using two identifiers. I am in my office.  The patient is at home.  Only the patient and myself participated in this encounter.  I discussed the limitations, risks, security and privacy concerns of performing an evaluation and management service by telephone and the availability of in person appointments. I also discussed with the patient that there may be a patient responsible charge related to this service. The patient expressed understanding and agreed to proceed.   History of Present Illness: Pt with hx of HTN, preDM, HL, tob dep, COPD, Vit D def and s/p RT rotator cuff repair and RT CTS release, LT OA knee.   COPD: doing and feeling better on O2. Got the nicotine patches and gum and has started using.  Working hard on quitting.  Went 1 wk without smoking with the urge got too overwhelming and she broke down and smoked a cigarette. -compliant with Symbicort and Albuterol.   Appt with pulmonary 07/22/2019.  IDA:  Scheduled for repeat c-scope 08/04/2019.  She stopped eating ice. Taking iron supplement as prescribed.  GI doctor ordered blood test 07/23/2019.    PreDM: dx on last visit. Tolerating Metformin.  Did see nutritionist and found it helpful.  Loss 8 lbs so far.   Outpatient Encounter Medications as of 07/14/2019  Medication Sig   albuterol (PROVENTIL) (2.5 MG/3ML) 0.083% nebulizer solution Take 3 mLs (2.5 mg total) by nebulization every 6 (six) hours as needed for wheezing or shortness of breath.   albuterol (VENTOLIN HFA) 108 (90 Base) MCG/ACT inhaler INHALE 2 PUFFS BY MOUTH EVERY 6 HOURS AS NEEDED FOR WHEEZING FOR SHORTNESS OF BREATH   amLODipine (NORVASC) 10 MG  tablet Take 1 tablet (10 mg total) by mouth daily.   aspirin EC 81 MG tablet Take 1 tablet (81 mg total) by mouth daily.   budesonide-formoterol (SYMBICORT) 80-4.5 MCG/ACT inhaler Inhale 2 puffs into the lungs 2 (two) times daily.   cetirizine (ZYRTEC) 10 MG tablet Take 1 tablet (10 mg total) by mouth daily.   clotrimazole-betamethasone (LOTRISONE) cream APPLY  CREAM TOPICALLY TWICE DAILY   cromolyn (OPTICROM) 4 % ophthalmic solution Place 1 drop into both eyes 4 (four) times daily.   ferrous sulfate 325 (65 FE) MG tablet Take 1 tablet (325 mg total) by mouth daily with breakfast.   LINZESS 145 MCG CAPS capsule    lisinopril-hydrochlorothiazide (ZESTORETIC) 20-25 MG tablet Take 1 tablet by mouth daily.   lovastatin (MEVACOR) 40 MG tablet TAKE 1 TABLET BY MOUTH AT BEDTIME.   metFORMIN (GLUCOPHAGE) 500 MG tablet Take 0.5 tablets (250 mg total) by mouth daily with breakfast.   predniSONE (DELTASONE) 20 MG tablet Take 1 tablet (20 mg total) by mouth daily with breakfast. (Patient not taking: Reported on 07/14/2019)   No facility-administered encounter medications on file as of 07/14/2019.    Observations/Objective: No direct observation done as this was a telephone encounter.  Assessment and Plan: 1. Chronic obstructive pulmonary disease, unspecified COPD type (HCC) Continue Symbicort and albuterol. Commended her on her efforts so far to try to quit smoking.  Encouraged her to use the patches and gum consistently - budesonide-formoterol (SYMBICORT) 80-4.5 MCG/ACT inhaler; Inhale 2 puffs into the lungs 2 (two) times  daily.  Dispense: 1 Inhaler; Refill: 6  2. Hypoxemia requiring supplemental oxygen Doing better on O2 with ambulation  3. Tobacco dependence See #1 above  4. Iron deficiency anemia, unspecified iron deficiency anemia type I wanted to recheck CBC to see whether she still needs to be on iron.  However patient tells me that she has blood work already scheduled with the  gastroenterologist.  I have requested that she has them send me the results when she has the blood test done.  They plan to do colonoscopy on her next month  5. Prediabetes Continue Metformin.  Commended her on weight loss.  Continue to encourage healthy eating habits   Follow Up Instructions: 4 mths   I discussed the assessment and treatment plan with the patient. The patient was provided an opportunity to ask questions and all were answered. The patient agreed with the plan and demonstrated an understanding of the instructions.   The patient was advised to call back or seek an in-person evaluation if the symptoms worsen or if the condition fails to improve as anticipated.  I provided 9 minutes of non-face-to-face time during this encounter.   Karle Plumber, MD

## 2019-07-22 ENCOUNTER — Encounter: Payer: Self-pay | Admitting: Pulmonary Disease

## 2019-07-22 ENCOUNTER — Other Ambulatory Visit: Payer: Self-pay

## 2019-07-22 ENCOUNTER — Ambulatory Visit (INDEPENDENT_AMBULATORY_CARE_PROVIDER_SITE_OTHER): Payer: Medicare HMO

## 2019-07-22 ENCOUNTER — Ambulatory Visit (INDEPENDENT_AMBULATORY_CARE_PROVIDER_SITE_OTHER): Payer: Medicare HMO | Admitting: Pulmonary Disease

## 2019-07-22 VITALS — BP 132/78 | HR 80 | Temp 98.4°F | Ht 67.5 in | Wt 201.0 lb

## 2019-07-22 DIAGNOSIS — R0602 Shortness of breath: Secondary | ICD-10-CM | POA: Diagnosis not present

## 2019-07-22 DIAGNOSIS — R059 Cough, unspecified: Secondary | ICD-10-CM

## 2019-07-22 DIAGNOSIS — J449 Chronic obstructive pulmonary disease, unspecified: Secondary | ICD-10-CM

## 2019-07-22 DIAGNOSIS — R05 Cough: Secondary | ICD-10-CM

## 2019-07-22 DIAGNOSIS — J90 Pleural effusion, not elsewhere classified: Secondary | ICD-10-CM | POA: Diagnosis not present

## 2019-07-22 NOTE — Patient Instructions (Signed)
History of obstructive lung disease  We will go ahead and get a chest x-ray Obtain a pulmonary function test  Continue Symbicort Continue albuterol as needed  Stay off cigarettes as you said you will  Obtain a pulse oximeter to check your oxygen levels  I will see you in 8 weeks Call with significant concerns

## 2019-07-22 NOTE — Progress Notes (Signed)
Alison Ford    [id number]    14-Jan-1956  Primary Care Physician:Johnson, Dalbert Batman, MD  Referring Physician: Ladell Pier, MD [address] [place],  [place] [address]  Chief complaint: Shortness of breath with activity  HPI:   Patient with a history of COPD  Was told about COPD a couple years ago  She has been on inhalers and albuterol  She feels shortness of breath with mild to moderate exertion She has cut down her smoking significantly and is planning on quitting the next couple of days  Weight has fluctuated She has a cough bringing up some secretions Does not feel acutely ill Has not had any febrile illness  Denies any chest pains or chest discomfort  At some point was smoking up to a pack of cigarettes a day  She worked in Scientist, research (medical), housekeeping in the past  No family history of lung disease   Outpatient Encounter Medications as of 07/22/2019  Medication Sig  . albuterol (PROVENTIL) (2.5 MG/3ML) 0.083% nebulizer solution Take 3 mLs (2.5 mg total) by nebulization every 6 (six) hours as needed for wheezing or shortness of breath.  Marland Kitchen albuterol (VENTOLIN HFA) 108 (90 Base) MCG/ACT inhaler INHALE 2 PUFFS BY MOUTH EVERY 6 HOURS AS NEEDED FOR WHEEZING FOR SHORTNESS OF BREATH  . amLODipine (NORVASC) 10 MG tablet Take 1 tablet by mouth once daily  . aspirin EC 81 MG tablet Take 1 tablet (81 mg total) by mouth daily.  . budesonide-formoterol (SYMBICORT) 80-4.5 MCG/ACT inhaler Inhale 2 puffs into the lungs 2 (two) times daily.  . cetirizine (ZYRTEC) 10 MG tablet Take 1 tablet (10 mg total) by mouth daily.  . clotrimazole-betamethasone (LOTRISONE) cream APPLY CREAM TOPICALLY TWICE DAILY.  . cromolyn (OPTICROM) 4 % ophthalmic solution Place 1 drop into both eyes 4 (four) times daily.  . ferrous sulfate 325 (65 FE) MG tablet Take 1 tablet (325 mg total) by mouth daily with breakfast.  . LINZESS 145 MCG CAPS capsule   . lisinopril-hydrochlorothiazide  (ZESTORETIC) 20-25 MG tablet Take 1 tablet by mouth daily.  Marland Kitchen lovastatin (MEVACOR) 40 MG tablet TAKE 1 TABLET BY MOUTH AT BEDTIME  . metFORMIN (GLUCOPHAGE) 500 MG tablet Take 0.5 tablets (250 mg total) by mouth daily with breakfast.   No facility-administered encounter medications on file as of 07/22/2019.    Allergies as of 07/22/2019 - Review Complete 07/22/2019  Allergen Reaction Noted  . Penicillins Itching and Other (See Comments) 07/28/2011    Past Medical History:  Diagnosis Date  . Abnormal facial hair   . Back pain    d/t knee pain  . Headache(784.0)   . Heart murmur    slight  . HTN (hypertension)    takes Amlodipine daily and HCTZ   . Hyperlipidemia    takes Lovastatin nightly  . Joint pain   . Right knee DJD   . Weakness    left hand    Past Surgical History:  Procedure Laterality Date  . ABDOMINAL HYSTERECTOMY    . Athroscopic knee surgery     Bilateral  . CARPAL TUNNEL RELEASE Right 05/02/2016   Procedure: CARPAL TUNNEL RELEASE;  Surgeon: Meredith Pel, MD;  Location: [facility];  Service: Orthopedics;  Laterality: Right;  . CHOLECYSTECTOMY    . DILATION AND CURETTAGE OF UTERUS    . EYE SURGERY Right   . SHOULDER ARTHROSCOPY WITH SUBACROMIAL DECOMPRESSION Right 05/02/2016   Procedure: RIGHT SHOULDER ARTHROSCOPY WITH SUBACROMIAL  DECOMPRESSION, MINI OPEN ROTATOR CUFF TEAR REPAIR, AND RIGHT CARPAL TUNNEL RELEASE;  Surgeon: Meredith Pel, MD;  Location: [facility];  Service: Orthopedics;  Laterality: Right;  . TOTAL KNEE ARTHROPLASTY Right 07/15/2012   Procedure: RIGHT TOTAL KNEE ARTHROPLASTY;  Surgeon: Lorn Junes, MD;  Location: [facility];  Service: Orthopedics;  Laterality: Right;    Family History  Problem Relation Age of Onset  . CAD Father 59  . CAD Mother 51  . Hypertension Mother     Social History   Socioeconomic History  . Marital status: Legally Separated    Spouse name: Not on file  . Number of children: 3  . Years of education: Not on file   . Highest education level: Not on file  Occupational History    Employer: [employer]  Tobacco Use  . Smoking status: Current Every Day Smoker    Packs/day: 0.50  . Smokeless tobacco: Never Used  . Tobacco comment: Smoking 3-4 cigs/day  Substance and Sexual Activity  . Alcohol use: Yes    Alcohol/week: 0.0 standard drinks    Comment: ocassionally  . Drug use: No  . Sexual activity: Never  Other Topics Concern  . Not on file  Social History Narrative   Lives with daughter and two grandsons.    Social Determinants of Health   Financial Resource Strain:   . Difficulty of Paying Living Expenses:   Food Insecurity:   . Worried About Charity fundraiser in the Last Year:   . Arboriculturist in the Last Year:   Transportation Needs:   . Film/video editor (Medical):   Marland Kitchen Lack of Transportation (Non-Medical):   Physical Activity:   . Days of Exercise per Week:   . Minutes of Exercise per Session:   Stress:   . Feeling of Stress :   Social Connections:   . Frequency of Communication with Friends and Family:   . Frequency of Social Gatherings with Friends and Family:   . Attends Religious Services:   . Active Member of Clubs or Organizations:   . Attends Archivist Meetings:   Marland Kitchen Marital Status:   Intimate Partner Violence:   . Fear of Current or Ex-Partner:   . Emotionally Abused:   Marland Kitchen Physically Abused:   . Sexually Abused:     Review of Systems  Constitutional: Negative.   HENT: Negative.   Eyes: Negative.   Respiratory: Positive for cough and shortness of breath.   Cardiovascular: Negative.   Gastrointestinal: Negative.   Genitourinary: Negative.   Musculoskeletal: Negative.     Vitals:   07/22/19 1028  BP: 132/78  Pulse: 80  Temp: 98.4 F (36.9 C)  SpO2: 95%     Physical Exam Constitutional:      Appearance: Normal appearance.  HENT:     Head: Normocephalic and atraumatic.     Nose: No congestion.  Eyes:     General:        Right eye:  No discharge.        Left eye: No discharge.     Pupils: Pupils are equal, round, and reactive to light.  Cardiovascular:     Rate and Rhythm: Normal rate and regular rhythm.     Pulses: Normal pulses.     Heart sounds: Normal heart sounds. No murmur heard.  No friction rub.  Pulmonary:     Effort: Pulmonary effort is normal. No respiratory distress.     Breath sounds: No stridor. Rhonchi  present. No wheezing.  Abdominal:     General: Abdomen is flat.  Musculoskeletal:     Cervical back: No rigidity or tenderness.  Skin:    General: Skin is warm.  Neurological:     General: No focal deficit present.     Mental Status: She is alert.  Psychiatric:        Mood and Affect: Mood normal.    Data Reviewed: Echocardiogram 06/06/2019: Ejection fraction of 65 to 70%, dilated atria no mention of pulmonary hypertension  Assessment:  History of chronic obstructive pulmonary disease -On Symbicort and albuterol -No PFT on record  Active smoker -Working on quitting smoking  Hypoxemic respiratory failure -Has been on oxygen supplementation -Oxygen was started following an exercise oximetry  Plan/Recommendations: Encourage patient to obtain a pulse oximeter  We will obtain a chest x-ray  Obtain a pulmonary function test -We do need this to confirm that she has obstructive lung disease  Continue Symbicort at present  Smoking cessation counseling  Encouraged to call with any significant concerns   Sherrilyn Rist MD [facility] 07/22/2019, 10:57 AM  CC: Ladell Pier, MD

## 2019-07-22 NOTE — Addendum Note (Signed)
Addended by: Edythe Clarity on: 07/22/2019 11:07 AM   Modules accepted: Orders

## 2019-07-23 DIAGNOSIS — D509 Iron deficiency anemia, unspecified: Secondary | ICD-10-CM | POA: Diagnosis not present

## 2019-07-31 ENCOUNTER — Other Ambulatory Visit: Payer: Self-pay | Admitting: Internal Medicine

## 2019-07-31 DIAGNOSIS — J449 Chronic obstructive pulmonary disease, unspecified: Secondary | ICD-10-CM

## 2019-07-31 DIAGNOSIS — I10 Essential (primary) hypertension: Secondary | ICD-10-CM

## 2019-07-31 DIAGNOSIS — E785 Hyperlipidemia, unspecified: Secondary | ICD-10-CM

## 2019-07-31 MED ORDER — LISINOPRIL-HYDROCHLOROTHIAZIDE 20-25 MG PO TABS: 1.0000 | ORAL_TABLET | Freq: Every day | ORAL | 0 refills | Status: DC

## 2019-07-31 MED ORDER — BUDESONIDE-FORMOTEROL FUMARATE 80-4.5 MCG/ACT IN AERO: 2.0000 | INHALATION_SPRAY | Freq: Two times a day (BID) | RESPIRATORY_TRACT | 6 refills | Status: DC

## 2019-07-31 MED ORDER — LOVASTATIN 40 MG PO TABS: 40.0000 mg | ORAL_TABLET | Freq: Every day | ORAL | 1 refills | Status: DC

## 2019-07-31 NOTE — Telephone Encounter (Signed)
Medication Refill - Medication:  lisinopril-hydrochlorothiazide (ZESTORETIC) 20-25 MG tablet [[id number]]  lovastatin (MEVACOR) 40 MG tablet  budesonide-formoterol (SYMBICORT) 80-4.5 s the patient contacted their pharmacy? Yes.   (Agent: If no, request that the patient contact the pharmacy for the refill.) (Agent: If yes, when and what did the pharmacy advise?)  Preferred Pharmacy (with phone number or street name): [facility] mail [facility]  [phone]- phone number   Agent: Please be advised that RX refills may take up to 3 business days. We ask that you follow-up with your pharmacy.

## 2019-07-31 NOTE — Telephone Encounter (Signed)
Change in pharmacy- Rx forwarded

## 2019-08-04 DIAGNOSIS — K2930 Chronic superficial gastritis without bleeding: Secondary | ICD-10-CM | POA: Diagnosis not present

## 2019-08-04 DIAGNOSIS — K2980 Duodenitis without bleeding: Secondary | ICD-10-CM | POA: Diagnosis not present

## 2019-08-04 DIAGNOSIS — K635 Polyp of colon: Secondary | ICD-10-CM | POA: Diagnosis not present

## 2019-08-04 DIAGNOSIS — K2970 Gastritis, unspecified, without bleeding: Secondary | ICD-10-CM | POA: Diagnosis not present

## 2019-08-04 DIAGNOSIS — K219 Gastro-esophageal reflux disease without esophagitis: Secondary | ICD-10-CM | POA: Diagnosis not present

## 2019-08-04 DIAGNOSIS — K31819 Angiodysplasia of stomach and duodenum without bleeding: Secondary | ICD-10-CM | POA: Diagnosis not present

## 2019-08-04 DIAGNOSIS — K228 Other specified diseases of esophagus: Secondary | ICD-10-CM | POA: Diagnosis not present

## 2019-08-04 DIAGNOSIS — D120 Benign neoplasm of cecum: Secondary | ICD-10-CM | POA: Diagnosis not present

## 2019-08-04 DIAGNOSIS — D509 Iron deficiency anemia, unspecified: Secondary | ICD-10-CM | POA: Diagnosis not present

## 2019-08-04 DIAGNOSIS — K317 Polyp of stomach and duodenum: Secondary | ICD-10-CM | POA: Diagnosis not present

## 2019-08-07 DIAGNOSIS — K2930 Chronic superficial gastritis without bleeding: Secondary | ICD-10-CM | POA: Diagnosis not present

## 2019-08-07 DIAGNOSIS — D120 Benign neoplasm of cecum: Secondary | ICD-10-CM | POA: Diagnosis not present

## 2019-08-07 DIAGNOSIS — K219 Gastro-esophageal reflux disease without esophagitis: Secondary | ICD-10-CM | POA: Diagnosis not present

## 2019-08-07 DIAGNOSIS — K2980 Duodenitis without bleeding: Secondary | ICD-10-CM | POA: Diagnosis not present

## 2019-08-07 DIAGNOSIS — K635 Polyp of colon: Secondary | ICD-10-CM | POA: Diagnosis not present

## 2019-08-13 ENCOUNTER — Encounter: Payer: Self-pay | Admitting: Internal Medicine

## 2019-08-13 DIAGNOSIS — J449 Chronic obstructive pulmonary disease, unspecified: Secondary | ICD-10-CM | POA: Diagnosis not present

## 2019-08-13 NOTE — Progress Notes (Signed)
I received note from [facility] gastroenterology Dr. Alessandra Bevels.  Patient underwent EGD and colonoscopy on 08/04/2019.  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  H. pylori negative.  Colonoscopy revealed small adenoma.  Recommendation is for repeat colonoscopy in 3 years as the prep was fair.

## 2019-08-21 ENCOUNTER — Other Ambulatory Visit: Payer: Self-pay

## 2019-08-21 ENCOUNTER — Ambulatory Visit (INDEPENDENT_AMBULATORY_CARE_PROVIDER_SITE_OTHER): Payer: Medicare HMO | Admitting: Pulmonary Disease

## 2019-08-21 DIAGNOSIS — R059 Cough, unspecified: Secondary | ICD-10-CM

## 2019-08-21 DIAGNOSIS — J449 Chronic obstructive pulmonary disease, unspecified: Secondary | ICD-10-CM

## 2019-08-21 DIAGNOSIS — R0602 Shortness of breath: Secondary | ICD-10-CM

## 2019-08-21 LAB — PULMONARY FUNCTION TEST
DL/VA % pred: 65 %
DL/VA: 2.66 ml/min/mmHg/L
DLCO cor % pred: 50 %
DLCO cor: 11.39 ml/min/mmHg
DLCO unc % pred: 50 %
DLCO unc: 11.39 ml/min/mmHg
FEF 25-75 Post: 0.89 L/sec
FEF 25-75 Pre: 0.91 L/sec
FEF2575-%Change-Post: -2 %
FEF2575-%Pred-Post: 40 %
FEF2575-%Pred-Pre: 42 %
FEV1-%Change-Post: 0 %
FEV1-%Pred-Post: 72 %
FEV1-%Pred-Pre: 72 %
FEV1-Post: 1.68 L
FEV1-Pre: 1.67 L
FEV1FVC-%Change-Post: 4 %
FEV1FVC-%Pred-Pre: 83 %
FEV6-%Change-Post: -1 %
FEV6-%Pred-Post: 85 %
FEV6-%Pred-Pre: 86 %
FEV6-Post: 2.44 L
FEV6-Pre: 2.48 L
FEV6FVC-%Change-Post: 1 %
FEV6FVC-%Pred-Post: 103 %
FEV6FVC-%Pred-Pre: 101 %
FVC-%Change-Post: -3 %
FVC-%Pred-Post: 82 %
FVC-%Pred-Pre: 85 %
FVC-Post: 2.44 L
FVC-Pre: 2.52 L
Post FEV1/FVC ratio: 69 %
Post FEV6/FVC ratio: 100 %
Pre FEV1/FVC ratio: 66 %
Pre FEV6/FVC Ratio: 98 %
RV % pred: 59 %
RV: 1.34 L
TLC % pred: 73 %
TLC: 4.08 L

## 2019-08-21 NOTE — Progress Notes (Signed)
PFT done today. 

## 2019-09-11 ENCOUNTER — Ambulatory Visit (INDEPENDENT_AMBULATORY_CARE_PROVIDER_SITE_OTHER): Payer: Medicare HMO | Admitting: Pulmonary Disease

## 2019-09-11 ENCOUNTER — Encounter: Payer: Self-pay | Admitting: Pulmonary Disease

## 2019-09-11 ENCOUNTER — Other Ambulatory Visit: Payer: Self-pay

## 2019-09-11 DIAGNOSIS — J449 Chronic obstructive pulmonary disease, unspecified: Secondary | ICD-10-CM | POA: Diagnosis not present

## 2019-09-11 DIAGNOSIS — J9691 Respiratory failure, unspecified with hypoxia: Secondary | ICD-10-CM | POA: Diagnosis not present

## 2019-09-11 MED ORDER — ALBUTEROL SULFATE HFA 108 (90 BASE) MCG/ACT IN AERS: INHALATION_SPRAY | RESPIRATORY_TRACT | 2 refills | Status: DC

## 2019-09-11 MED ORDER — ALBUTEROL SULFATE (2.5 MG/3ML) 0.083% IN NEBU: 2.5000 mg | INHALATION_SOLUTION | Freq: Four times a day (QID) | RESPIRATORY_TRACT | 1 refills | Status: DC | PRN

## 2019-09-11 MED ORDER — TRELEGY ELLIPTA 100-62.5-25 MCG/INH IN AEPB: 1.0000 | INHALATION_SPRAY | Freq: Every morning | RESPIRATORY_TRACT | 3 refills | Status: DC

## 2019-09-11 NOTE — Patient Instructions (Signed)
Chronic obstructive pulmonary disease  We will switch her from Symbicort to Trelegy to be used once a day  Continue albuterol as needed  Continue working on quitting smoking  Graded exercise as tolerated  Follow-up in 6 months

## 2019-09-11 NOTE — Progress Notes (Signed)
Alison Ford    [id number]    [birth year]  Primary Care Physician:Johnson, Dalbert Batman, MD  Referring Physician: Ladell Pier, MD [address] [place],   [address]  Chief complaint:  Shortness of breath with activity  HPI:   Patient with a history of COPD  Was told about COPD a couple years ago continues to need albuterol on a regular basis  She stated she was able to stop smoking for just a couple of weeks but went back to smoking  Denies any chest pains or chest discomfort She is limited with activities of daily living She continues to work on quitting smoking  Weight has fluctuated She has a cough bringing up some secretions-this is not any worse Does not feel acutely ill Has not had any febrile illness  Denies any chest pains or chest discomfort  At some point was smoking up to a pack of cigarettes a day  She worked in Scientist, research (medical), housekeeping in the past  No family history of lung disease   Outpatient Encounter Medications as of 09/11/2019  Medication Sig  . albuterol (PROVENTIL) (2.5 MG/3ML) 0.083% nebulizer solution Take 3 mLs (2.5 mg total) by nebulization every 6 (six) hours as needed for wheezing or shortness of breath.  Marland Kitchen albuterol (VENTOLIN HFA) 108 (90 Base) MCG/ACT inhaler INHALE 2 PUFFS BY MOUTH EVERY 6 HOURS AS NEEDED FOR WHEEZING FOR SHORTNESS OF BREATH  . amLODipine (NORVASC) 10 MG tablet Take 1 tablet by mouth once daily  . aspirin EC 81 MG tablet Take 1 tablet (81 mg total) by mouth daily.  . budesonide-formoterol (SYMBICORT) 80-4.5 MCG/ACT inhaler Inhale 2 puffs into the lungs 2 (two) times daily.  . cetirizine (ZYRTEC) 10 MG tablet Take 1 tablet (10 mg total) by mouth daily.  . clotrimazole-betamethasone (LOTRISONE) cream APPLY CREAM TOPICALLY TWICE DAILY.  . cromolyn (OPTICROM) 4 % ophthalmic solution Place 1 drop into both eyes 4 (four) times daily.  . ferrous sulfate 325 (65 FE) MG tablet Take 1 tablet (325 mg total) by  mouth daily with breakfast.  . LINZESS 145 MCG CAPS capsule   . lisinopril-hydrochlorothiazide (ZESTORETIC) 20-25 MG tablet Take 1 tablet by mouth daily.  Marland Kitchen lovastatin (MEVACOR) 40 MG tablet Take 1 tablet (40 mg total) by mouth at bedtime.  . metFORMIN (GLUCOPHAGE) 500 MG tablet Take 0.5 tablets (250 mg total) by mouth daily with breakfast.   No facility-administered encounter medications on file as of 09/11/2019.    Allergies as of 09/11/2019 - Review Complete 09/11/2019  Allergen Reaction Noted  . Penicillins Itching and Other (See Comments) 07/28/2011    Past Medical History:  Diagnosis Date  . Abnormal facial hair   . Back pain    d/t knee pain  . Headache(784.0)   . Heart murmur    slight  . HTN (hypertension)    takes Amlodipine daily and HCTZ   . Hyperlipidemia    takes Lovastatin nightly  . Joint pain   . Right knee DJD   . Weakness    left hand    Past Surgical History:  Procedure Laterality Date  . ABDOMINAL HYSTERECTOMY    . Athroscopic knee surgery     Bilateral  . CARPAL TUNNEL RELEASE Right 05/02/2016   Procedure: CARPAL TUNNEL RELEASE;  Surgeon: Meredith Pel, MD;  Location: [facility];  Service: Orthopedics;  Laterality: Right;  . CHOLECYSTECTOMY    . DILATION AND CURETTAGE OF UTERUS    .  EYE SURGERY Right   . SHOULDER ARTHROSCOPY WITH SUBACROMIAL DECOMPRESSION Right 05/02/2016   Procedure: RIGHT SHOULDER ARTHROSCOPY WITH SUBACROMIAL DECOMPRESSION, MINI OPEN ROTATOR CUFF TEAR REPAIR, AND RIGHT CARPAL TUNNEL RELEASE;  Surgeon: Meredith Pel, MD;  Location: [facility];  Service: Orthopedics;  Laterality: Right;  . TOTAL KNEE ARTHROPLASTY Right 07/15/2012   Procedure: RIGHT TOTAL KNEE ARTHROPLASTY;  Surgeon: Lorn Junes, MD;  Location: [facility];  Service: Orthopedics;  Laterality: Right;    Family History  Problem Relation Age of Onset  . CAD Father 81  . CAD Mother 59  . Hypertension Mother     Social History   Socioeconomic History  . Marital  status: Legally Separated    Spouse name: Not on file  . Number of children: 3  . Years of education: Not on file  . Highest education level: Not on file  Occupational History    Employer: [id number]  Tobacco Use  . Smoking status: Current Every Day Smoker    Packs/day: 0.50    Years: 15.00    Pack years: 7.50    Types: Cigarettes  . Smokeless tobacco: Never Used  . Tobacco comment: Smoking 3-4 cigs/day  Substance and Sexual Activity  . Alcohol use: Yes    Alcohol/week: 0.0 standard drinks    Comment: ocassionally  . Drug use: No  . Sexual activity: Never  Other Topics Concern  . Not on file  Social History Narrative   Lives with daughter and two grandsons.    Social Determinants of Health   Financial Resource Strain:   . Difficulty of Paying Living Expenses: Not on file  Food Insecurity:   . Worried About Charity fundraiser in the Last Year: Not on file  . Ran Out of Food in the Last Year: Not on file  Transportation Needs:   . Lack of Transportation (Medical): Not on file  . Lack of Transportation (Non-Medical): Not on file  Physical Activity:   . Days of Exercise per Week: Not on file  . Minutes of Exercise per Session: Not on file  Stress:   . Feeling of Stress : Not on file  Social Connections:   . Frequency of Communication with Friends and Family: Not on file  . Frequency of Social Gatherings with Friends and Family: Not on file  . Attends Religious Services: Not on file  . Active Member of Clubs or Organizations: Not on file  . Attends Archivist Meetings: Not on file  . Marital Status: Not on file  Intimate Partner Violence:   . Fear of Current or Ex-Partner: Not on file  . Emotionally Abused: Not on file  . Physically Abused: Not on file  . Sexually Abused: Not on file    Review of Systems  Constitutional: Negative.   HENT: Negative.   Eyes: Negative.   Respiratory: Positive for cough and shortness of breath.   Cardiovascular: Negative.    Gastrointestinal: Negative.   Genitourinary: Negative.   Musculoskeletal: Negative.     Vitals:   09/11/19 1020  BP: 134/80  Pulse: 70  SpO2: 97%     Physical Exam Constitutional:      Appearance: Normal appearance.  HENT:     Head: Normocephalic and atraumatic.     Nose: No congestion.  Cardiovascular:     Rate and Rhythm: Normal rate and regular rhythm.     Pulses: Normal pulses.     Heart sounds: Normal heart sounds. No murmur heard.  No  friction rub.  Pulmonary:     Effort: Pulmonary effort is normal. No respiratory distress.     Breath sounds: No stridor. Rhonchi present. No wheezing.  Musculoskeletal:     Cervical back: No rigidity or tenderness.  Neurological:     Mental Status: She is alert.    Data Reviewed: Echocardiogram 06/06/2019: Ejection fraction of 65 to 70%, dilated atria no mention of pulmonary hypertension Pulmonary function test 08/21/2019 significant for mild obstructive disease with no significant bronchodilator response  Assessment:  History of chronic obstructive pulmonary disease Stage I COPD, however quite symptomatic at present -Change from Symbicort to Trelegy -Continue using albuterol as needed  Active smoker -Working on quitting smoking -Importance of quitting smoking was stressed  Hypoxemic respiratory failure -Has been on oxygen supplementation -Oxygen was started following an exercise oximetry -Stated has not been needing to use it  Plan/Recommendations: Trelegy 100, 1 puff daily Continue albuterol as needed  Trelegy 1 puff daily  Smoking cessation counseling  Encouraged to call with any significant concerns  Follow-up in 6 months   Sherrilyn Rist MD [facility] 09/11/2019, 10:21 AM  CC: Ladell Pier, MD

## 2019-09-13 DIAGNOSIS — J449 Chronic obstructive pulmonary disease, unspecified: Secondary | ICD-10-CM | POA: Diagnosis not present

## 2019-09-19 ENCOUNTER — Encounter: Payer: Self-pay | Admitting: Orthopedic Surgery

## 2019-09-19 ENCOUNTER — Ambulatory Visit (INDEPENDENT_AMBULATORY_CARE_PROVIDER_SITE_OTHER): Payer: Medicare HMO | Admitting: Orthopedic Surgery

## 2019-09-19 ENCOUNTER — Ambulatory Visit (INDEPENDENT_AMBULATORY_CARE_PROVIDER_SITE_OTHER): Payer: Medicare HMO

## 2019-09-19 ENCOUNTER — Other Ambulatory Visit: Payer: Self-pay | Admitting: Surgical

## 2019-09-19 ENCOUNTER — Ambulatory Visit: Payer: Self-pay

## 2019-09-19 ENCOUNTER — Telehealth: Payer: Self-pay

## 2019-09-19 VITALS — Ht 67.5 in | Wt 197.0 lb

## 2019-09-19 DIAGNOSIS — M25511 Pain in right shoulder: Secondary | ICD-10-CM | POA: Diagnosis not present

## 2019-09-19 DIAGNOSIS — M25561 Pain in right knee: Secondary | ICD-10-CM

## 2019-09-19 DIAGNOSIS — G8929 Other chronic pain: Secondary | ICD-10-CM

## 2019-09-19 DIAGNOSIS — M5412 Radiculopathy, cervical region: Secondary | ICD-10-CM

## 2019-09-19 MED ORDER — ACETAMINOPHEN-CODEINE #3 300-30 MG PO TABS: 1.0000 | ORAL_TABLET | Freq: Every evening | ORAL | 0 refills | Status: DC | PRN

## 2019-09-19 MED ORDER — ACETAMINOPHEN-CODEINE #3 300-30 MG PO TABS
1.0000 | ORAL_TABLET | Freq: Every evening | ORAL | 0 refills | Status: DC | PRN
Start: 2019-09-19 — End: 2019-12-26

## 2019-09-19 NOTE — Progress Notes (Signed)
Office Visit Note   Patient: Alison Ford           Date of Birth: 04-28-1955           MRN: [id number] Visit Date: 09/19/2019 Requested by: Alison Pier, MD [place],  [place] [address] PCP: Alison Pier, MD  Subjective: Chief Complaint  Patient presents with  . Right Shoulder - Pain  . Right Knee - Pain    HPI: Alison Ford is a patient with multiple orthopedic complaints today.  She had right shoulder surgery 2018 with rotator cuff tear repair.  She is having some right arm pain with radiation of pain numbness and tingling into the dorsal aspect of the right hand.  Hard for her to sleep on that side.  She also has loose right total knee prosthesis and she is ready to discuss surgery for that.  States that her gait is painful.  Had total knee replacement done 7 years ago by another physician.  Bone scan about 3 years ago did show increased uptake.  She has been living with the knee since that time.              ROS: All systems reviewed are negative as they relate to the chief complaint within the history of present illness.  Patient denies  fevers or chills.   Assessment & Plan: Visit Diagnoses:  1. Chronic right shoulder pain   2. Chronic pain of right knee   3. Radiculopathy, cervical region     Plan: Impression is well-functioning rotator cuff tear in the right shoulder with pretty normal exam.  Right knee does have some evidence of lucency around the tibial component.  I think with the pain she is having revision is not an unreasonable option.  However in regards to the neck and arm she is having significant radicular symptoms on that right-hand side.  MRI scan from 2018 did show some C5-6 right-sided foraminal narrowing.  Indication at this time is for MRI scan to be repeated with possible ESI possible surgical intervention if that foraminal narrowing has progressed.  Follow-up with [place] after that MRI of the cervical spine.  Once we get that resolved we  can start tackling this right knee issue.  Tylenol 3 prescribed 1 p.o. at night to help her sleep.  Follow-Up Instructions: Return for after MRI.   Orders:  Orders Placed This Encounter  Procedures  . XR KNEE 3 VIEW RIGHT  . XR Shoulder Right  . XR Cervical Spine 2 or 3 views  . MR Cervical Spine w/o contrast   Meds ordered this encounter  Medications  . DISCONTD: acetaminophen-codeine (TYLENOL #3) 300-30 MG tablet    Sig: Take 1 tablet by mouth at bedtime as needed for moderate pain.    Dispense:  30 tablet    Refill:  0      Procedures: No procedures performed   Clinical Data: No additional findings.  Objective: Vital Signs: Ht 5' 7.5" (1.715 m)   Wt 197 lb (89.4 kg)   BMI 30.40 kg/m   Physical Exam:   Constitutional: Patient appears well-developed HEENT:  Head: Normocephalic Eyes:EOM are normal Neck: Normal range of motion Cardiovascular: Normal rate Pulmonary/chest: Effort normal Neurologic: Patient is alert Skin: Skin is warm Psychiatric: Patient has normal mood and affect    Ortho Exam: Ortho exam demonstrates full active and passive range of motion of that right shoulder with no coarse grinding or crepitus present.  Cervical spine  range of motion is pretty reasonable with about 30 degrees of extension 30 degrees of flexion and with rotation to the right she does have some pain which radiates down to the right shoulder and arm.  Radial pulse intact bilaterally.  Does describe some mild paresthesias in the C6 distribution right versus left.  Reflexes symmetric bilateral biceps and triceps.  No muscle atrophy right arm versus left arm.  She has no coarse grinding or crepitus with passive range of motion of that right arm.  Right knee is examined.  No effusion but she does have some tibial joint line tenderness.  Collaterals are stable to varus and valgus stress at 0 30 and 90 degrees.  Specialty Comments:  No specialty comments available.  Imaging: XR KNEE 3  VIEW RIGHT  Result Date: 09/19/2019 AP lateral merchant right knee reviewed.  Total knee prosthesis in good position alignment.  There is some lucency around the medial aspect of the tibial plateau.  No acute fracture.  Femoral side looks reasonably intact  XR Shoulder Right  Result Date: 09/19/2019 AP outlet axillary right shoulder reviewed.  Humeral head is located.  No acute fracture.  Changes consistent with prior rotator cuff repair and biceps tenodesis noted in the humeral head.  No glenohumeral joint arthritis and mild AC joint degenerative changes.    PMFS History: Patient Active Problem List   Diagnosis Date Noted  . Class 1 obesity due to excess calories with serious comorbidity and body mass index (BMI) of 31.0 to 31.9 in adult 05/15/2019  . Prediabetes 05/15/2019  . Iron deficiency anemia 05/15/2019  . Systolic ejection murmur 94/70/9628  . Hypoxemia requiring supplemental oxygen 05/15/2019  . Normocytic anemia 04/29/2019  . Chronic obstructive pulmonary disease ([place]) 05/30/2016  . Rotator cuff tear 05/02/2016  . Rotator cuff tendonitis, right 12/30/2015  . Cervicalgia 12/30/2015  . Eczema, dyshidrotic 11/18/2014  . Acne 11/18/2014  . Smoker 06/01/2014  . Right knee DJD   . Hyperlipidemia   . HTN (hypertension) 06/05/2012   Past Medical History:  Diagnosis Date  . Abnormal facial hair   . Back pain    d/t knee pain  . Headache(784.0)   . Heart murmur    slight  . HTN (hypertension)    takes Amlodipine daily and HCTZ   . Hyperlipidemia    takes Lovastatin nightly  . Joint pain   . Right knee DJD   . Weakness    left hand    Family History  Problem Relation Age of Onset  . CAD Father 62  . CAD Mother 62  . Hypertension Mother     Past Surgical History:  Procedure Laterality Date  . ABDOMINAL HYSTERECTOMY    . Athroscopic knee surgery     Bilateral  . CARPAL TUNNEL RELEASE Right 05/02/2016   Procedure: CARPAL TUNNEL RELEASE;  Surgeon: Meredith Pel, MD;  Location: [facility];  Service: Orthopedics;  Laterality: Right;  . CHOLECYSTECTOMY    . DILATION AND CURETTAGE OF UTERUS    . EYE SURGERY Right   . SHOULDER ARTHROSCOPY WITH SUBACROMIAL DECOMPRESSION Right 05/02/2016   Procedure: RIGHT SHOULDER ARTHROSCOPY WITH SUBACROMIAL DECOMPRESSION, MINI OPEN ROTATOR CUFF TEAR REPAIR, AND RIGHT CARPAL TUNNEL RELEASE;  Surgeon: Meredith Pel, MD;  Location: [facility];  Service: Orthopedics;  Laterality: Right;  . TOTAL KNEE ARTHROPLASTY Right 07/15/2012   Procedure: RIGHT TOTAL KNEE ARTHROPLASTY;  Surgeon: Lorn Junes, MD;  Location: [facility];  Service: Orthopedics;  Laterality: Right;  Social History   Occupational History    Employer: [id number]  Tobacco Use  . Smoking status: Current Every Day Smoker    Packs/day: 0.50    Years: 15.00    Pack years: 7.50    Types: Cigarettes  . Smokeless tobacco: Never Used  . Tobacco comment: Smoking 3-4 cigs/day  Substance and Sexual Activity  . Alcohol use: Yes    Alcohol/week: 0.0 standard drinks    Comment: ocassionally  . Drug use: No  . Sexual activity: Never

## 2019-09-19 NOTE — Telephone Encounter (Signed)
Gildardo Cranker with [facility] called stating that Rx for Tylenol #3 has to be sent electronically.  Cb# [phone].  Please advise.  Thank you.

## 2019-09-19 NOTE — Telephone Encounter (Signed)
Could you send this in electronically? I called it in this morning, but they will not accept it. Thanks.

## 2019-09-23 ENCOUNTER — Other Ambulatory Visit: Payer: Self-pay

## 2019-09-23 DIAGNOSIS — Z1231 Encounter for screening mammogram for malignant neoplasm of breast: Secondary | ICD-10-CM

## 2019-09-29 ENCOUNTER — Telehealth: Payer: Self-pay | Admitting: Orthopedic Surgery

## 2019-09-29 NOTE — Telephone Encounter (Signed)
Called patient left message to return call to schedule an appointment for MRI review with Dr. Dean    

## 2019-10-14 DIAGNOSIS — J449 Chronic obstructive pulmonary disease, unspecified: Secondary | ICD-10-CM | POA: Diagnosis not present

## 2019-10-20 ENCOUNTER — Other Ambulatory Visit: Payer: Self-pay

## 2019-10-20 ENCOUNTER — Ambulatory Visit
Admission: RE | Admit: 2019-10-20 | Discharge: 2019-10-20 | Disposition: A | Discharge status: Home / Self Care | Payer: Medicare HMO | Source: Ambulatory Visit | Attending: Orthopedic Surgery | Admitting: Orthopedic Surgery

## 2019-10-20 DIAGNOSIS — M50123 Cervical disc disorder at C6-C7 level with radiculopathy: Secondary | ICD-10-CM | POA: Diagnosis not present

## 2019-10-20 DIAGNOSIS — M50122 Cervical disc disorder at C5-C6 level with radiculopathy: Secondary | ICD-10-CM | POA: Diagnosis not present

## 2019-10-20 DIAGNOSIS — M5412 Radiculopathy, cervical region: Secondary | ICD-10-CM

## 2019-10-20 DIAGNOSIS — M4722 Other spondylosis with radiculopathy, cervical region: Secondary | ICD-10-CM | POA: Diagnosis not present

## 2019-10-20 DIAGNOSIS — M50121 Cervical disc disorder at C4-C5 level with radiculopathy: Secondary | ICD-10-CM | POA: Diagnosis not present

## 2019-10-22 ENCOUNTER — Ambulatory Visit (INDEPENDENT_AMBULATORY_CARE_PROVIDER_SITE_OTHER): Payer: Medicare HMO | Admitting: Orthopedic Surgery

## 2019-10-22 ENCOUNTER — Encounter: Payer: Self-pay | Admitting: Orthopedic Surgery

## 2019-10-22 DIAGNOSIS — M5412 Radiculopathy, cervical region: Secondary | ICD-10-CM

## 2019-10-22 DIAGNOSIS — M25561 Pain in right knee: Secondary | ICD-10-CM | POA: Diagnosis not present

## 2019-10-22 NOTE — Progress Notes (Signed)
Office Visit Note   Patient: Alison Ford           Date of Birth: 08-24-1955           MRN: [id number] Visit Date: 10/22/2019 Requested by: Ladell Pier, MD [place],  [place] [id number] PCP: Ladell Pier, MD  Subjective: Chief Complaint  Patient presents with  . scan review    HPI: Alison Ford is a 64 year old patient with neck pain right arm pain and bilateral knee pain. Uses Tylenol for her symptoms. 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 Pictures are reviewed. She also reports bilateral knee pain. She uses Salonpas for that. Uses a brace for her knee which also helps. Her current brace has worn out.              ROS: All systems reviewed are negative as they relate to the chief complaint within the history of present illness.  Patient denies  fevers or chills.   Assessment & Plan: Visit Diagnoses:  1. 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 I think she be a great candidate for an injection but she does not want her neck injected. She does not really want surgery but would actually consider surgery before her neck injection. I think we can await on this and try physical therapy for her neck for about 4 weeks. Also gave her a right hinged knee brace which helps with her arthritis. Follow-up with me as needed. I do think that most neck surgeons would almost require her to get an injection before considering surgical intervention.  Follow-Up Instructions: Return if symptoms worsen or fail to improve.   Orders:  Orders Placed This Encounter  Procedures  . Ambulatory referral to Physical Therapy   No orders of the defined types were placed in this encounter.     Procedures: No procedures performed   Clinical Data: No additional findings.  Objective: Vital Signs: There were no vitals taken for this  visit.  Physical Exam:   Constitutional: Patient appears well-developed HEENT:  Head: Normocephalic Eyes:EOM are normal Neck: Normal range of motion Cardiovascular: Normal rate Pulmonary/chest: Effort normal Neurologic: Patient is alert Skin: Skin is warm Psychiatric: Patient has normal mood and affect    Ortho Exam: Ortho exam demonstrates full active and passive range of motion of the cervical spine. 5 out of 5 grip EPL FPL interosseous wrist flexion extension bicep tricep deltoid strength. Radial pulses palpable bilaterally. No definite paresthesias C5-T1 although she does describe pins and needle sensations occasionally in the C6 distribution on the right. Bilateral knees are examined. She has good range of motion with no effusion. Collateral crucial ligaments are stable. Extensor mechanism is intact. Not much in the way of focal joint line tenderness today.  Specialty Comments:  No specialty comments available.  Imaging: No results found.   PMFS History: Patient Active Problem List   Diagnosis Date Noted  . Class 1 obesity due to excess calories with serious comorbidity and body mass index (BMI) of 31.0 to 31.9 in adult 05/15/2019  . Prediabetes 05/15/2019  . Iron deficiency anemia 05/15/2019  . Systolic ejection murmur 90/30/0923  . Hypoxemia requiring supplemental oxygen 05/15/2019  . Normocytic anemia 04/29/2019  . Chronic obstructive pulmonary disease ([place]) 05/30/2016  . Rotator cuff tear 05/02/2016  . Rotator cuff tendonitis, right 12/30/2015  .  Cervicalgia 12/30/2015  . Eczema, dyshidrotic 11/18/2014  . Acne 11/18/2014  . Smoker 06/01/2014  . Right knee DJD   . Hyperlipidemia   . HTN (hypertension) 06/05/2012   Past Medical History:  Diagnosis Date  . Abnormal facial hair   . Back pain    d/t knee pain  . Headache(784.0)   . Heart murmur    slight  . HTN (hypertension)    takes Amlodipine daily and HCTZ   . Hyperlipidemia    takes Lovastatin nightly   . Joint pain   . Right knee DJD   . Weakness    left hand    Family History  Problem Relation Age of Onset  . CAD Father 53  . CAD Mother 75  . Hypertension Mother     Past Surgical History:  Procedure Laterality Date  . ABDOMINAL HYSTERECTOMY    . Athroscopic knee surgery     Bilateral  . CARPAL TUNNEL RELEASE Right 05/02/2016   Procedure: CARPAL TUNNEL RELEASE;  Surgeon: Meredith Pel, MD;  Location: [facility];  Service: Orthopedics;  Laterality: Right;  . CHOLECYSTECTOMY    . DILATION AND CURETTAGE OF UTERUS    . EYE SURGERY Right   . SHOULDER ARTHROSCOPY WITH SUBACROMIAL DECOMPRESSION Right 05/02/2016   Procedure: RIGHT SHOULDER ARTHROSCOPY WITH SUBACROMIAL DECOMPRESSION, MINI OPEN ROTATOR CUFF TEAR REPAIR, AND RIGHT CARPAL TUNNEL RELEASE;  Surgeon: Meredith Pel, MD;  Location: [facility];  Service: Orthopedics;  Laterality: Right;  . TOTAL KNEE ARTHROPLASTY Right 07/15/2012   Procedure: RIGHT TOTAL KNEE ARTHROPLASTY;  Surgeon: Lorn Junes, MD;  Location: [facility];  Service: Orthopedics;  Laterality: Right;   Social History   Occupational History    Employer: [employer]  Tobacco Use  . Smoking status: Current Every Day Smoker    Packs/day: 0.50    Years: 15.00    Pack years: 7.50    Types: Cigarettes  . Smokeless tobacco: Never Used  . Tobacco comment: Smoking 3-4 cigs/day  Substance and Sexual Activity  . Alcohol use: Yes    Alcohol/week: 0.0 standard drinks    Comment: ocassionally  . Drug use: No  . Sexual activity: Never

## 2019-10-24 DIAGNOSIS — D509 Iron deficiency anemia, unspecified: Secondary | ICD-10-CM | POA: Diagnosis not present

## 2019-10-30 ENCOUNTER — Ambulatory Visit
Admission: RE | Admit: 2019-10-30 | Discharge: 2019-10-30 | Disposition: A | Discharge status: Home / Self Care | Payer: Medicare HMO | Source: Ambulatory Visit | Attending: Internal Medicine | Admitting: Internal Medicine

## 2019-10-30 ENCOUNTER — Other Ambulatory Visit: Payer: Self-pay

## 2019-10-30 DIAGNOSIS — Z1231 Encounter for screening mammogram for malignant neoplasm of breast: Secondary | ICD-10-CM | POA: Diagnosis not present

## 2019-11-03 ENCOUNTER — Ambulatory Visit: Payer: Medicare HMO | Admitting: Internal Medicine

## 2019-11-04 NOTE — Progress Notes (Signed)
Normal result letter generated and mailed to address on file.

## 2019-11-05 ENCOUNTER — Telehealth: Payer: Self-pay | Admitting: Internal Medicine

## 2019-11-05 DIAGNOSIS — R7303 Prediabetes: Secondary | ICD-10-CM

## 2019-11-05 NOTE — Telephone Encounter (Signed)
I received recent lab results done through [facility] on this patient.  Labs collected 10/24/2019.  Hemoglobin/hematocrit 13.3/39 Iron 7/ferritin 7.6/iron saturation 12/total iron binding capacity 460.

## 2019-11-06 MED ORDER — TRUE METRIX METER W/DEVICE KIT: PACK | 0 refills | Status: AC

## 2019-11-06 MED ORDER — TRUE METRIX BLOOD GLUCOSE TEST VI STRP: ORAL_STRIP | 12 refills | Status: DC

## 2019-11-06 MED ORDER — TRUEPLUS LANCETS 28G MISC: 4 refills | Status: DC

## 2019-11-06 NOTE — Telephone Encounter (Signed)
Will forward to pcp

## 2019-11-06 NOTE — Telephone Encounter (Signed)
Pt given lab results per notes of Dr. Wynetta Emery on 11/06/19. Pt verbalized understanding. Patient reports she is still taking iron pill and not eating ice as directed. Patient would like to know if she is to continue metformin 1/2 tab and will she get labs done on next OV. Recommended patient to continue her medication until PCP orders to discontinue. Patient asking questions regarding checking blood glucose at home. Patient does not currently have glucometer. Reviewed signs and symptoms of hypo and hyperglycemia. Patient reported night sweats at times. Please advise .

## 2019-11-06 NOTE — Addendum Note (Signed)
Addended by: Karle Plumber B on: 11/06/2019 10:24 PM   Modules accepted: Orders

## 2019-11-06 NOTE — Telephone Encounter (Signed)
ATC pt, no answer, LM to [facility]

## 2019-11-07 NOTE — Telephone Encounter (Signed)
LM informing pt script sent and to [facility] to schedule visit for teaching if needed

## 2019-11-13 DIAGNOSIS — J449 Chronic obstructive pulmonary disease, unspecified: Secondary | ICD-10-CM | POA: Diagnosis not present

## 2019-11-19 ENCOUNTER — Telehealth: Payer: Self-pay

## 2019-11-19 NOTE — Telephone Encounter (Signed)
Spoke w/pt about appt. Pt stated that she would rather see her provider because she knows her . I rescheduled pt with Wynetta Emery for 11/5

## 2019-11-19 NOTE — Telephone Encounter (Signed)
-----   Message from Antony Blackbird, MD sent at 11/19/2019  9:42 AM EDT ----- Regarding: Upcoming visit Patient is on schedule supposedly to review her medications and see if some of these can be discontinued.  Please call patient and confirm if this is the reason for her visit and if so, please try to have patient rescheduled with an appointment at which she will be seen by her actual PCP, Dr. Wynetta Emery regarding changes in medication.

## 2019-11-20 ENCOUNTER — Ambulatory Visit: Payer: Medicare HMO | Admitting: Family Medicine

## 2019-11-21 ENCOUNTER — Other Ambulatory Visit: Payer: Self-pay

## 2019-11-21 ENCOUNTER — Ambulatory Visit: Payer: Medicare HMO | Attending: Internal Medicine | Admitting: Internal Medicine

## 2019-11-21 DIAGNOSIS — D508 Other iron deficiency anemias: Secondary | ICD-10-CM | POA: Diagnosis not present

## 2019-11-21 DIAGNOSIS — J449 Chronic obstructive pulmonary disease, unspecified: Secondary | ICD-10-CM

## 2019-11-21 DIAGNOSIS — R7303 Prediabetes: Secondary | ICD-10-CM | POA: Diagnosis not present

## 2019-11-21 DIAGNOSIS — F17200 Nicotine dependence, unspecified, uncomplicated: Secondary | ICD-10-CM | POA: Diagnosis not present

## 2019-11-21 DIAGNOSIS — K5909 Other constipation: Secondary | ICD-10-CM

## 2019-11-21 DIAGNOSIS — I10 Essential (primary) hypertension: Secondary | ICD-10-CM | POA: Diagnosis not present

## 2019-11-21 MED ORDER — LINZESS 145 MCG PO CAPS: 145.0000 ug | ORAL_CAPSULE | Freq: Every day | ORAL | 1 refills | Status: DC

## 2019-11-21 MED ORDER — AMLODIPINE BESYLATE 10 MG PO TABS: 10.0000 mg | ORAL_TABLET | Freq: Every day | ORAL | 1 refills | Status: DC

## 2019-11-21 MED ORDER — TRELEGY ELLIPTA 100-62.5-25 MCG/INH IN AEPB: 1.0000 | INHALATION_SPRAY | Freq: Every morning | RESPIRATORY_TRACT | 3 refills | Status: DC

## 2019-11-21 NOTE — Progress Notes (Signed)
Virtual Visit via Telephone Note Due to current restrictions/limitations of in-office visits due to the COVID-19 pandemic, this scheduled clinical appointment was converted to a telehealth visit  I connected with Alison Ford on 11/21/19 at 12:20 p.m by telephone and verified that I am speaking with the correct person using two identifiers.  Location: Patient: home Provider: office   I discussed the limitations, risks, security and privacy concerns of performing an evaluation and management service by telephone and the availability of in person appointments. I also discussed with the patient that there may be a patient responsible charge related to this service. The patient expressed understanding and agreed to proceed.   History of Present Illness: Pt with hx of HTN, preDM, HL, tob dep, COPD, on home O2, IDA, Vit D def and s/p RT rotator cuff repair and RT CTS release, LT OA knee.   Pt had question about iron supplement She stopped taking iron supplement in July after she received lab results from the GI She started taking it again the end of last mth when I had my CMA call her after I receive results from the gastroenterologist.  Her hemoglobin and hematocrit had normalized but iron level was 7 and ferritin was 7.6 with iron saturation of 12%.  Prediabetes: She also wants to know if should continue taking the Metformin.  Wants device to check BS.  I had sent a prescription to her pharmacy.  She states she was not aware of it and she will check with her pharmacy.  Reports she is doing okay with her eating habits.  COPD: She continues to use the oxygen especially when up ambulating.  She saw pulmonologist, Dr. Ander Slade, in August.  Dx with stage 1 COPD.  He switched her from Symbicort to Trelegy.  Advised to continue the albuterol as needed.  She is requesting that prescription for Trelegy be sent to [facility].  Smoking cessation strongly encouraged.  She states that she is trying desperately  to quit smoking.  She has not smoked in the past 16 days.  Had flu shot on 11/04/2019 at [facility].  Also had her first shingles shot they at the same day.   Outpatient Encounter Medications as of 11/21/2019  Medication Sig  . acetaminophen-codeine (TYLENOL #3) 300-30 MG tablet Take 1 tablet by mouth at bedtime as needed for moderate pain.  Marland Kitchen albuterol (PROVENTIL) (2.5 MG/3ML) 0.083% nebulizer solution Take 3 mLs (2.5 mg total) by nebulization every 6 (six) hours as needed for wheezing or shortness of breath.  Marland Kitchen albuterol (VENTOLIN HFA) 108 (90 Base) MCG/ACT inhaler 2 puffs QID as needed  . amLODipine (NORVASC) 10 MG tablet Take 1 tablet by mouth once daily  . aspirin EC 81 MG tablet Take 1 tablet (81 mg total) by mouth daily.  . Blood Glucose Monitoring Suppl (TRUE METRIX METER) w/Device KIT Use as directed  . budesonide-formoterol (SYMBICORT) 80-4.5 MCG/ACT inhaler Inhale 2 puffs into the lungs 2 (two) times daily.  . cetirizine (ZYRTEC) 10 MG tablet Take 1 tablet (10 mg total) by mouth daily.  . clotrimazole-betamethasone (LOTRISONE) cream APPLY CREAM TOPICALLY TWICE DAILY.  . cromolyn (OPTICROM) 4 % ophthalmic solution Place 1 drop into both eyes 4 (four) times daily.  . ferrous sulfate 325 (65 FE) MG tablet Take 1 tablet (325 mg total) by mouth daily with breakfast.  . Fluticasone-Umeclidin-Vilant (TRELEGY ELLIPTA) 100-62.5-25 MCG/INH AEPB Inhale 1 puff into the lungs every morning.  Marland Kitchen glucose blood (TRUE METRIX BLOOD GLUCOSE TEST) test strip Use  as instructed  . LINZESS 145 MCG CAPS capsule   . lisinopril-hydrochlorothiazide (ZESTORETIC) 20-25 MG tablet Take 1 tablet by mouth daily.  Marland Kitchen lovastatin (MEVACOR) 40 MG tablet Take 1 tablet (40 mg total) by mouth at bedtime.  . metFORMIN (GLUCOPHAGE) 500 MG tablet Take 0.5 tablets (250 mg total) by mouth daily with breakfast.  . pantoprazole (PROTONIX) 40 MG tablet   . TRUEplus Lancets 28G MISC Use as directed   No facility-administered encounter  medications on file as of 11/21/2019.    Observations/Objective: Results for orders placed or performed in visit on 08/21/19  Pulmonary Function Test  Result Value Ref Range   FVC-Pre 2.52 L   FVC-%Pred-Pre 85 %   FVC-Post 2.44 L   FVC-%Pred-Post 82 %   FVC-%Change-Post -3 %   FEV1-Pre 1.67 L   FEV1-%Pred-Pre 72 %   FEV1-Post 1.68 L   FEV1-%Pred-Post 72 %   FEV1-%Change-Post 0 %   FEV6-Pre 2.48 L   FEV6-%Pred-Pre 86 %   FEV6-Post 2.44 L   FEV6-%Pred-Post 85 %   FEV6-%Change-Post -1 %   Pre FEV1/FVC ratio 66 %   FEV1FVC-%Pred-Pre 83 %   Post FEV1/FVC ratio 69 %   FEV1FVC-%Change-Post 4 %   Pre FEV6/FVC Ratio 98 %   FEV6FVC-%Pred-Pre 101 %   Post FEV6/FVC ratio 100 %   FEV6FVC-%Pred-Post 103 %   FEV6FVC-%Change-Post 1 %   FEF 25-75 Pre 0.91 L/sec   FEF2575-%Pred-Pre 42 %   FEF 25-75 Post 0.89 L/sec   FEF2575-%Pred-Post 40 %   FEF2575-%Change-Post -2 %   RV 1.34 L   RV % pred 59 %   TLC 4.08 L   TLC % pred 73 %   DLCO unc 11.39 ml/min/mmHg   DLCO unc % pred 50 %   DLCO cor 11.39 ml/min/mmHg   DLCO cor % pred 50 %   DL/VA 2.66 ml/min/mmHg/L   DL/VA % pred 65 %     Assessment and Plan: 1. Essential hypertension - amLODipine (NORVASC) 10 MG tablet; Take 1 tablet (10 mg total) by mouth daily.  Dispense: 90 tablet; Refill: 1 - Lipid panel; Future - Comprehensive metabolic panel; Future  2. Prediabetes Continue Metformin.  She will check with her pharmacy for the testing supplies that were sent - Hemoglobin A1c; Future  3. Stage 1 mild COPD by GOLD classification ([place]) Strongly advised to quit smoking - Fluticasone-Umeclidin-Vilant (TRELEGY ELLIPTA) 100-62.5-25 MCG/INH AEPB; Inhale 1 puff into the lungs every morning.  Dispense: 60 each; Refill: 3  4. Tobacco dependence Commended her on trying to quit and for being tobacco free for 2 wks now.  Advised to remain tobacco free.  Less than 5 mins spent on counseling   5. Other iron deficiency anemia Continue iron  supplement for now to increase iron stores a [place] more Will recheck iron studies on f/u with me in 4 wks when she comes for Medicare wellness 6. Chronic constipation - LINZESS 145 MCG CAPS capsule; Take 1 capsule (145 mcg total) by mouth daily before breakfast.  Dispense: 90 capsule; Refill: 1   Follow Up Instructions: 4 wks for AWV.   I discussed the assessment and treatment plan with the patient. The patient was provided an opportunity to ask questions and all were answered. The patient agreed with the plan and demonstrated an understanding of the instructions.   The patient was advised to call back or seek an in-person evaluation if the symptoms worsen or if the condition fails to improve as anticipated.  I provided 17 minutes of non-face-to-face time during this encounter.   Karle Plumber, MD

## 2019-11-21 NOTE — Progress Notes (Signed)
Would like to discuss medications.

## 2019-11-26 ENCOUNTER — Other Ambulatory Visit: Payer: Self-pay

## 2019-11-26 ENCOUNTER — Ambulatory Visit: Payer: Medicare HMO | Attending: Internal Medicine

## 2019-11-26 DIAGNOSIS — R7303 Prediabetes: Secondary | ICD-10-CM

## 2019-11-26 DIAGNOSIS — I10 Essential (primary) hypertension: Secondary | ICD-10-CM

## 2019-11-27 ENCOUNTER — Other Ambulatory Visit: Payer: Self-pay | Admitting: Internal Medicine

## 2019-11-27 DIAGNOSIS — I10 Essential (primary) hypertension: Secondary | ICD-10-CM

## 2019-11-27 LAB — COMPREHENSIVE METABOLIC PANEL
ALT: 18 IU/L (ref 0–32)
AST: 19 IU/L (ref 0–40)
Albumin/Globulin Ratio: 1.8 (ref 1.2–2.2)
Albumin: 4.4 g/dL (ref 3.8–4.8)
Alkaline Phosphatase: 79 IU/L (ref 44–121)
BUN/Creatinine Ratio: 15 (ref 12–28)
BUN: 13 mg/dL (ref 8–27)
Bilirubin Total: 0.2 mg/dL (ref 0.0–1.2)
CO2: 23 mmol/L (ref 20–29)
Calcium: 10.2 mg/dL (ref 8.7–10.3)
Chloride: 104 mmol/L (ref 96–106)
Creatinine, Ser: 0.86 mg/dL (ref 0.57–1.00)
GFR calc Af Amer: 83 mL/min/{1.73_m2} (ref >59)
GFR calc non Af Amer: 72 mL/min/{1.73_m2} (ref >59)
Globulin, Total: 2.4 g/dL (ref 1.5–4.5)
Glucose: 99 mg/dL (ref 65–99)
Potassium: 4.6 mmol/L (ref 3.5–5.2)
Sodium: 140 mmol/L (ref 134–144)
Total Protein: 6.8 g/dL (ref 6.0–8.5)

## 2019-11-27 LAB — LIPID PANEL
Chol/HDL Ratio: 3.7 ratio (ref 0.0–4.4)
Cholesterol, Total: 219 mg/dL — ABNORMAL HIGH (ref 100–199)
HDL: 59 mg/dL (ref >39)
LDL Chol Calc (NIH): 140 mg/dL — ABNORMAL HIGH (ref 0–99)
Triglycerides: 113 mg/dL (ref 0–149)
VLDL Cholesterol Cal: 20 mg/dL (ref 5–40)

## 2019-11-27 LAB — HEMOGLOBIN A1C
Est. average glucose Bld gHb Est-mCnc: 114 mg/dL
Hgb A1c MFr Bld: 5.6 % (ref 4.8–5.6)

## 2019-11-27 NOTE — Progress Notes (Signed)
Let patient know that she is no longer in the prediabetic range.  She should continue current low dose of Metformin.  Kidney and liver function tests are normal. LDL cholesterol is 140 with goal being less than 100.  Is she taking the lovastatin consistently.  If she has been taking it consistently for the past 2 to 3 months then we will need to change the lovastatin to a different cholesterol medication that works better like Lipitor or Crestor.  Please let me know her answer.

## 2019-11-28 ENCOUNTER — Telehealth: Payer: Self-pay

## 2019-11-28 NOTE — Telephone Encounter (Signed)
Okay for this

## 2019-11-28 NOTE — Telephone Encounter (Signed)
Patient would like to have a referral for PT for her right shoulder along with the referral for her neck.  Cb# [phone].  Please advise.  Thank you.

## 2019-11-28 NOTE — Telephone Encounter (Signed)
Contacted pt to go over lab results pt didn't answer lvm

## 2019-11-28 NOTE — Telephone Encounter (Signed)
Ok to make referral as requested below?

## 2019-12-01 ENCOUNTER — Other Ambulatory Visit: Payer: Self-pay

## 2019-12-01 DIAGNOSIS — M5412 Radiculopathy, cervical region: Secondary | ICD-10-CM

## 2019-12-01 DIAGNOSIS — M25511 Pain in right shoulder: Secondary | ICD-10-CM

## 2019-12-01 DIAGNOSIS — G8929 Other chronic pain: Secondary | ICD-10-CM

## 2019-12-01 NOTE — Telephone Encounter (Signed)
PT order in chart

## 2019-12-02 ENCOUNTER — Telehealth: Payer: Self-pay | Admitting: Orthopedic Surgery

## 2019-12-02 NOTE — Telephone Encounter (Signed)
Pt called wanting to know if she should keep her appt for 12/03/19 since she just wanted to discuss getting a referral for PT; which has been done but she's not sure wether Dr. Marlou Sa had anything in addition he wanted to discuss with her. If not please let me know so pt can cancel her appt

## 2019-12-02 NOTE — Telephone Encounter (Signed)
IC patient.She would like to cancel appt for now and will call back when she is ready to reschedule.

## 2019-12-03 ENCOUNTER — Ambulatory Visit: Payer: Medicare HMO | Admitting: Orthopedic Surgery

## 2019-12-14 DIAGNOSIS — J449 Chronic obstructive pulmonary disease, unspecified: Secondary | ICD-10-CM | POA: Diagnosis not present

## 2019-12-23 ENCOUNTER — Other Ambulatory Visit: Payer: Self-pay | Admitting: Internal Medicine

## 2019-12-23 DIAGNOSIS — R7303 Prediabetes: Secondary | ICD-10-CM

## 2019-12-23 MED ORDER — TRUE METRIX BLOOD GLUCOSE TEST VI STRP: ORAL_STRIP | 12 refills | Status: DC

## 2019-12-23 NOTE — Telephone Encounter (Signed)
Patient called to advise the test strip Rx was sent and received by [facility] on 11/06/19. She says they told her they didn't have the prescription and she's out of test strips. I advised I will confirm with [facility] and resend if they did not receive. [facility] called and spoke to [place], [employer]chant [employer] officer who says they never received the refill.

## 2019-12-23 NOTE — Telephone Encounter (Signed)
Patient requesting true metrix self monitoring blood glucose meter test trips, patient states she would like request expedited.     [place] Phone:  [phone]  Fax:  [phone]

## 2019-12-24 MED ORDER — TRUE METRIX BLOOD GLUCOSE TEST VI STRP: ORAL_STRIP | 4 refills | Status: DC

## 2019-12-24 NOTE — Addendum Note (Signed)
Addended by: Matilde Sprang on: 12/24/2019 12:17 PM   Modules accepted: Orders

## 2019-12-24 NOTE — Telephone Encounter (Signed)
Rx sent 

## 2019-12-24 NOTE — Telephone Encounter (Signed)
Pt states the pharmacy told her they need how many times she test. Pt states she test TID

## 2019-12-24 NOTE — Telephone Encounter (Signed)
Rx sent to pharmacy on 12/23/19, but the pharmacy will need a new script with the number of times to check blood sugar instead of as instructed. Please resend asap since the patient has no strips to check blood sugar.

## 2019-12-24 NOTE — Addendum Note (Signed)
Addended by: Daisy Blossom, Annie Main L on: 12/24/2019 02:27 PM   Modules accepted: Orders

## 2019-12-25 ENCOUNTER — Other Ambulatory Visit: Payer: Self-pay

## 2019-12-25 ENCOUNTER — Encounter: Payer: Self-pay | Admitting: Physical Therapy

## 2019-12-25 ENCOUNTER — Ambulatory Visit: Payer: Medicare HMO | Attending: Orthopedic Surgery | Admitting: Physical Therapy

## 2019-12-25 DIAGNOSIS — M5412 Radiculopathy, cervical region: Secondary | ICD-10-CM | POA: Diagnosis not present

## 2019-12-25 DIAGNOSIS — R252 Cramp and spasm: Secondary | ICD-10-CM | POA: Diagnosis not present

## 2019-12-25 DIAGNOSIS — G8929 Other chronic pain: Secondary | ICD-10-CM | POA: Insufficient documentation

## 2019-12-25 DIAGNOSIS — M25611 Stiffness of right shoulder, not elsewhere classified: Secondary | ICD-10-CM | POA: Insufficient documentation

## 2019-12-25 DIAGNOSIS — M25511 Pain in right shoulder: Secondary | ICD-10-CM | POA: Insufficient documentation

## 2019-12-26 ENCOUNTER — Other Ambulatory Visit: Payer: Self-pay | Admitting: Surgical

## 2019-12-26 ENCOUNTER — Ambulatory Visit: Payer: Medicare HMO | Attending: Internal Medicine | Admitting: Internal Medicine

## 2019-12-26 ENCOUNTER — Telehealth: Payer: Self-pay | Admitting: Orthopedic Surgery

## 2019-12-26 ENCOUNTER — Encounter: Payer: Self-pay | Admitting: Physical Therapy

## 2019-12-26 ENCOUNTER — Encounter: Payer: Self-pay | Admitting: Internal Medicine

## 2019-12-26 ENCOUNTER — Telehealth: Payer: Self-pay | Admitting: Surgical

## 2019-12-26 VITALS — BP 123/80 | HR 72 | Resp 16 | Ht 67.5 in | Wt 194.2 lb

## 2019-12-26 DIAGNOSIS — F17200 Nicotine dependence, unspecified, uncomplicated: Secondary | ICD-10-CM | POA: Diagnosis not present

## 2019-12-26 DIAGNOSIS — Z789 Other specified health status: Secondary | ICD-10-CM | POA: Diagnosis not present

## 2019-12-26 DIAGNOSIS — Z2821 Immunization not carried out because of patient refusal: Secondary | ICD-10-CM

## 2019-12-26 DIAGNOSIS — Z0000 Encounter for general adult medical examination without abnormal findings: Secondary | ICD-10-CM

## 2019-12-26 DIAGNOSIS — Z713 Dietary counseling and surveillance: Secondary | ICD-10-CM

## 2019-12-26 NOTE — Telephone Encounter (Signed)
Please advise 

## 2019-12-26 NOTE — Telephone Encounter (Signed)
I called to advise, no answer. Voicemail does not state who phone belongs to, no message left.

## 2019-12-26 NOTE — Patient Instructions (Signed)
Continue to work on trying to quit smoking.  You have set a quit date for the first of next year.  Work on trying to improve your activity level as tolerated.  Once you receive your COVID booster and second shingles vaccine, please call and let us know so that I can update your health maintenance.  Try snacking on healthier things like fruits or nuts.   Healthy Eating Following a healthy eating pattern may help you to achieve and maintain a healthy body weight, reduce the risk of chronic disease, and live a long and productive life. It is important to follow a healthy eating pattern at an appropriate calorie level for your body. Your nutritional needs should be met primarily through food by choosing a variety of nutrient-rich foods. What are tips for following this plan? Reading food labels  Read labels and choose the following: ? Reduced or low sodium. ? Juices with 100% fruit juice. ? Foods with low saturated fats and high polyunsaturated and monounsaturated fats. ? Foods with whole grains, such as whole wheat, cracked wheat, brown rice, and wild rice. ? Whole grains that are fortified with folic acid. This is recommended for women who are pregnant or who want to become pregnant.  Read labels and avoid the following: ? Foods with a lot of added sugars. These include foods that contain brown sugar, corn sweetener, corn syrup, dextrose, fructose, glucose, high-fructose corn syrup, honey, invert sugar, lactose, malt syrup, maltose, molasses, raw sugar, sucrose, trehalose, or turbinado sugar.  Do not eat more than the following amounts of added sugar per day:  6 teaspoons (25 g) for women.  9 teaspoons (38 g) for men. ? Foods that contain processed or refined starches and grains. ? Refined grain products, such as white flour, degermed cornmeal, white bread, and white rice. Shopping  Choose nutrient-rich snacks, such as vegetables, whole fruits, and nuts. Avoid high-calorie and  high-sugar snacks, such as potato chips, fruit snacks, and candy.  Use oil-based dressings and spreads on foods instead of solid fats such as butter, stick margarine, or cream cheese.  Limit pre-made sauces, mixes, and "instant" products such as flavored rice, instant noodles, and ready-made pasta.  Try more plant-protein sources, such as tofu, tempeh, black beans, edamame, lentils, nuts, and seeds.  Explore eating plans such as the Mediterranean diet or vegetarian diet. Cooking  Use oil to saut or stir-fry foods instead of solid fats such as butter, stick margarine, or lard.  Try baking, boiling, grilling, or broiling instead of frying.  Remove the fatty part of meats before cooking.  Steam vegetables in water or broth. Meal planning   At meals, imagine dividing your plate into fourths: ? One-half of your plate is fruits and vegetables. ? One-fourth of your plate is whole grains. ? One-fourth of your plate is protein, especially lean meats, poultry, eggs, tofu, beans, or nuts.  Include low-fat dairy as part of your daily diet. Lifestyle  Choose healthy options in all settings, including home, work, school, restaurants, or stores.  Prepare your food safely: ? Wash your hands after handling raw meats. ? Keep food preparation surfaces clean by regularly washing with hot, soapy water. ? Keep raw meats separate from ready-to-eat foods, such as fruits and vegetables. ? Cook seafood, meat, poultry, and eggs to the recommended internal temperature. ? Store foods at safe temperatures. In general:  Keep cold foods at 49F (4.4C) or below.  Keep hot foods at 149F (60C) or above.  Keep your freezer  at [facility] (-17.8C) or below.  Foods are no longer safe to eat when they have been between the temperatures of 40-140F (4.4-60C) for more than 2 hours. What foods should I eat? Fruits Aim to eat 2 cup-equivalents of fresh, canned (in natural juice), or frozen fruits each day.  Examples of 1 cup-equivalent of fruit include 1 small apple, 8 large strawberries, 1 cup canned fruit,  cup dried fruit, or 1 cup 100% juice. Vegetables Aim to eat 2-3 cup-equivalents of fresh and frozen vegetables each day, including different varieties and colors. Examples of 1 cup-equivalent of vegetables include 2 medium carrots, 2 cups raw, leafy greens, 1 cup chopped vegetable (raw or cooked), or 1 medium baked potato. Grains Aim to eat 6 ounce-equivalents of whole grains each day. Examples of 1 ounce-equivalent of grains include 1 slice of bread, 1 cup ready-to-eat cereal, 3 cups popcorn, or  cup cooked rice, pasta, or cereal. Meats and other proteins Aim to eat 5-6 ounce-equivalents of protein each day. Examples of 1 ounce-equivalent of protein include 1 egg, 1/2 cup nuts or seeds, or 1 tablespoon (16 g) peanut butter. A cut of meat or fish that is the size of a deck of cards is about 3-4 ounce-equivalents.  Of the protein you eat each week, try to have at least 8 ounces come from seafood. This includes salmon, trout, herring, and anchovies. Dairy Aim to eat 3 cup-equivalents of fat-free or low-fat dairy each day. Examples of 1 cup-equivalent of dairy include 1 cup (240 mL) milk, 8 ounces (250 g) yogurt, 1 ounces (44 g) natural cheese, or 1 cup (240 mL) fortified soy milk. Fats and oils  Aim for about 5 teaspoons (21 g) per day. Choose monounsaturated fats, such as canola and olive oils, avocados, peanut butter, and most nuts, or polyunsaturated fats, such as sunflower, corn, and soybean oils, walnuts, pine nuts, sesame seeds, sunflower seeds, and flaxseed. Beverages  Aim for six 8-oz glasses of water per day. Limit coffee to three to five 8-oz cups per day.  Limit caffeinated beverages that have added calories, such as soda and energy drinks.  Limit alcohol intake to no more than 1 drink a day for nonpregnant women and 2 drinks a day for men. One drink equals 12 oz of beer (355 mL),  5 oz of wine (148 mL), or 1 oz of hard liquor (44 mL). Seasoning and other foods  Avoid adding excess amounts of salt to your foods. Try flavoring foods with herbs and spices instead of salt.  Avoid adding sugar to foods.  Try using oil-based dressings, sauces, and spreads instead of solid fats. This information is based on general [place] nutrition guidelines. For more information, visit [employer].es. Exact amounts may vary based on your nutrition needs. Summary  A healthy eating plan may help you to maintain a healthy weight, reduce the risk of chronic diseases, and stay active throughout your life.  Plan your meals. Make sure you eat the right portions of a variety of nutrient-rich foods.  Try baking, boiling, grilling, or broiling instead of frying.  Choose healthy options in all settings, including home, work, school, restaurants, or stores. This information is not intended to replace advice given to you by your health care provider. Make sure you discuss any questions you have with your health care provider. Document Revised: 04/16/2017 Document Reviewed: 04/16/2017 Elsevier Patient Education  [place].

## 2019-12-26 NOTE — Progress Notes (Signed)
Subjective:   Alison Ford is a 64 y.o. female who presents for an Initial Medicare Annual Wellness Visit. Pt with hx of HTN,preDM,HL, tob dep, COPD stage 1 (Dr. Ander Slade), on home O2, IDA, Vit D def and s/p RT rotator cuff repair and RT CTS release, LT OA knee.  Review of Systems          Objective:    Today's Vitals   12/26/19 1516  BP: 123/80  Pulse: 72  Resp: 16  SpO2: 95%  Weight: 194 lb 3.2 oz (88.1 kg)  Height: 5' 7.5" (1.715 m)  PainSc: 8   O2 level is on room air.  She does not have her oxygen with her today. Body mass index is 29.97 kg/m.  Advanced Directives 12/26/2019 12/25/2019 01/28/2019 05/30/2017 12/13/2016 08/29/2016 05/30/2016  Does Patient Have a Medical Advance Directive? No No No No No No No  Would patient like information on creating a medical advance directive? No - Patient declined No - Patient declined No - Patient declined No - Patient declined No - Patient declined No - Patient declined No - Patient declined  Pre-existing out of facility DNR order (yellow form or pink MOST form) - - - - - - -    Current Medications (verified) Outpatient Encounter Medications as of 12/26/2019  Medication Sig  . albuterol (PROVENTIL) (2.5 MG/3ML) 0.083% nebulizer solution Take 3 mLs (2.5 mg total) by nebulization every 6 (six) hours as needed for wheezing or shortness of breath. (Patient not taking: Reported on 12/25/2019)  . albuterol (VENTOLIN HFA) 108 (90 Base) MCG/ACT inhaler 2 puffs QID as needed  . amLODipine (NORVASC) 10 MG tablet Take 1 tablet (10 mg total) by mouth daily.  Marland Kitchen aspirin EC 81 MG tablet Take 1 tablet (81 mg total) by mouth daily.  . Blood Glucose Monitoring Suppl (TRUE METRIX METER) w/Device KIT Use as directed  . cetirizine (ZYRTEC) 10 MG tablet Take 1 tablet (10 mg total) by mouth daily.  . clotrimazole-betamethasone (LOTRISONE) cream APPLY CREAM TOPICALLY TWICE DAILY.  . cromolyn (OPTICROM) 4 % ophthalmic solution Place 1 drop into both  eyes 4 (four) times daily.  . ferrous sulfate 325 (65 FE) MG tablet Take 1 tablet (325 mg total) by mouth daily with breakfast.  . Fluticasone-Umeclidin-Vilant (TRELEGY ELLIPTA) 100-62.5-25 MCG/INH AEPB Inhale 1 puff into the lungs every morning.  Marland Kitchen glucose blood (TRUE METRIX BLOOD GLUCOSE TEST) test strip Use to check blood sugar once daily.  Marland Kitchen LINZESS 145 MCG CAPS capsule Take 1 capsule (145 mcg total) by mouth daily before breakfast.  . lisinopril-hydrochlorothiazide (ZESTORETIC) 20-25 MG tablet TAKE 1 TABLET EVERY DAY  . lovastatin (MEVACOR) 40 MG tablet Take 1 tablet (40 mg total) by mouth at bedtime.  . metFORMIN (GLUCOPHAGE) 500 MG tablet Take 0.5 tablets (250 mg total) by mouth daily with breakfast.  . pantoprazole (PROTONIX) 40 MG tablet   . TRUEplus Lancets 28G MISC Use as directed  . [DISCONTINUED] acetaminophen-codeine (TYLENOL #3) 300-30 MG tablet Take 1 tablet by mouth at bedtime as needed for moderate pain.   No facility-administered encounter medications on file as of 12/26/2019.    Allergies (verified) Penicillins   History: Past Medical History:  Diagnosis Date  . Abnormal facial hair   . Back pain    d/t knee pain  . Headache(784.0)   . Heart murmur    slight  . HTN (hypertension)    takes Amlodipine daily and HCTZ   . Hyperlipidemia  takes Lovastatin nightly  . Joint pain   . Right knee DJD   . Weakness    left hand   Past Surgical History:  Procedure Laterality Date  . ABDOMINAL HYSTERECTOMY    . Athroscopic knee surgery     Bilateral  . CARPAL TUNNEL RELEASE Right 05/02/2016   Procedure: CARPAL TUNNEL RELEASE;  Surgeon: Meredith Pel, MD;  Location: [place];  Service: Orthopedics;  Laterality: Right;  . CHOLECYSTECTOMY    . DILATION AND CURETTAGE OF UTERUS    . EYE SURGERY Right   . SHOULDER ARTHROSCOPY WITH SUBACROMIAL DECOMPRESSION Right 05/02/2016   Procedure: RIGHT SHOULDER ARTHROSCOPY WITH SUBACROMIAL DECOMPRESSION, MINI OPEN ROTATOR CUFF  TEAR REPAIR, AND RIGHT CARPAL TUNNEL RELEASE;  Surgeon: Meredith Pel, MD;  Location: [place];  Service: Orthopedics;  Laterality: Right;  . TOTAL KNEE ARTHROPLASTY Right 07/15/2012   Procedure: RIGHT TOTAL KNEE ARTHROPLASTY;  Surgeon: Lorn Junes, MD;  Location: [place];  Service: Orthopedics;  Laterality: Right;   Family History  Problem Relation Age of Onset  . CAD Father 92  . CAD Mother 48  . Hypertension Mother    Social History   Socioeconomic History  . Marital status: Legally Separated    Spouse name: Not on file  . Number of children: 3  . Years of education: Not on file  . Highest education level: Not on file  Occupational History    Employer: [employer]  Tobacco Use  . Smoking status: Current Every Day Smoker    Packs/day: 0.50    Years: 15.00    Pack years: 7.50    Types: Cigarettes  . Smokeless tobacco: Never Used  . Tobacco comment: Smoking 3-4 cigs/day  Substance and Sexual Activity  . Alcohol use: Yes    Alcohol/week: 0.0 standard drinks    Comment: ocassionally  . Drug use: No  . Sexual activity: Never  Other Topics Concern  . Not on file  Social History Narrative   Lives with daughter and two grandsons.    Social Determinants of Health   Financial Resource Strain: Not on file  Food Insecurity: Not on file  Transportation Needs: Not on file  Physical Activity: Not on file  Stress: Not on file  Social Connections: Not on file    Tobacco Counseling Ready to quit: Yes Counseling given: Yes Comment: Smoking 3-4 cigs/day  Started smoking again around thanksgiving. Smoking 2-4 cigarettes a day and some days none.  Plans to quit again by new yr.  Using the nicotine  gum.  Clinical Intake:  Pain : 0-10 Pain Score: 8  Pain Location: Shoulder Pain Orientation: Right    Diabetic?no.  Has hx of preDM      Activities of Daily Living In your present state of health, do you have any difficulty performing the following activities: 12/26/2019   Hearing? N  Vision? Y  Difficulty concentrating or making decisions? N  Walking or climbing stairs? N  Dressing or bathing? N  Doing errands, shopping? N  Preparing Food and eating ? N  Using the Toilet? N  In the past six months, have you accidently leaked urine? N  Do you have problems with loss of bowel control? Y  Managing your Medications? N  Managing your Finances? N  Housekeeping or managing your Housekeeping? N  Some recent data might be hidden   Has problems seeing things at a distance.  Wears bifocals but left them at home today.  Had eye exam with Dr.  Grout 09/2019 but has not filled rxn as yet. Plans to wait to fill until 01/2019 as insurance will help cover with cost for the lenses. Has problems with constipation but Linzess very helpful  Patient Care Team: Ladell Pier, MD as PCP - General (Internal Medicine) Ophthalmologist:  Dr. Schuyler Amor. Ortho:  Dr. Marcene Duos GI: PA, Deliah Goody  Indicate any recent Medical Services you may have received from other than [facility] providers in the past year (date may be approximate). She had colonoscopy and endoscopy earlier this year through [facility] gastroenterology.    Assessment:   This is a routine wellness examination for [place].  Hearing/Vision screen  Hearing Screening   125Hz  250Hz  500Hz  1000Hz  2000Hz  3000Hz  4000Hz  6000Hz  8000Hz   Right ear:           Left ear:             Visual Acuity Screening   Right eye Left eye Both eyes  Without correction: 20 40 20 40 20 30  With correction:       Dietary issues and exercise activities discussed: -trying to eat healthy but sometimes she snack late at nights on popcorn.  -very active around the house  Goals   None   TO quit smoking  Depression Screen PHQ 2/9 Scores 12/26/2019 05/15/2019 04/29/2019 04/01/2018 04/19/2017 01/19/2017 08/29/2016  PHQ - 2 Score 1 1 0 0 0 0 1  PHQ- 9 Score - - - 2 - - -  Exception Documentation - - - - - - -    Fall Risk Fall Risk   12/26/2019 11/21/2019 06/05/2019 04/01/2018 04/19/2017  Falls in the past year? 0 0 0 0 No  Number falls in past yr: 0 - - - -  Injury with Fall? 0 - - - -  Risk for fall due to : - - - - -    FALL [place]:  Any stairs in or around the home? No  If so, are there any without handrails? NA Home free of loose throw rugs in walkways, pet beds, electrical cords, etc? No  yes Adequate lighting in your home to reduce risk of falls? Yes   ASSISTIVE DEVICES UTILIZED TO PREVENT FALLS:  Life alert? No .  Does not feel she needs 1 at this time Use of a cane, walker or w/c? Yes .Reports that she uses cane all the time but does not have with her today Grab bars in the bathroom? Yes  yes Shower chair or bench in shower? Yes  yes Elevated toilet seat or a handicapped toilet? No no  TIMED UP AND GO:  Was the test performed? Yes .  Length of time to ambulate 10 feet: 10 sec.   Gait slow and steady without use of assistive device  Cognitive Function: MMSE - Mini Mental State Exam 12/26/2019  Orientation to time 5  Orientation to Place 5  Registration 3  Attention/ Calculation 5  Recall 3  Language- name 2 objects 2  Language- repeat 1  Language- follow 3 step command 3  Language- read & follow direction 1  Write a sentence 1  Copy design 0  Total score 29        Immunizations Immunization History  Administered Date(s) Administered  . Influenza, Quadrivalent, Recombinant, Inj, Pf 10/22/2018  . Influenza,inj,Quad PF,6+ Mos 02/11/2014, 02/11/2015, 09/29/2015  . Influenza-Unspecified 11/04/2019  . PFIZER SARS-COV-2 Vaccination 04/23/2019, 05/14/2019  . Zoster Recombinat (Shingrix) 11/04/2019    TDAP status: Up  to date  Flu Vaccine status: Up to date  Pneumococcal vaccine status: Declined,  Education has been provided regarding the importance of this vaccine but patient still declined. Advised may receive this vaccine at local pharmacy or Health Dept.  Aware to provide a copy of the vaccination record if obtained from local pharmacy or Health Dept. Verbalized acceptance and understanding.   Covid-19 vaccine status: Completed vaccines. will get booster this Wednesday at [facility]  Qualifies for Shingles Vaccine? Yes   Zostavax completed No   Shingrix Completed?: No.    Education has been provided regarding the importance of this vaccine. Patient has been advised to call insurance company to determine out of pocket expense if they have not yet received this vaccine. Advised may also receive vaccine at local pharmacy or Health Dept. Verbalized acceptance and understanding. She has completed her first shot of the Shingrix.  She plans to get her second shot of Shingrix later this month at [facility].  Health Maintenance  Topic Date Due  . COVID-19 Vaccine (3 - Booster for Pfizer series) 11/13/2019  . MAMMOGRAM  10/29/2021  . TETANUS/TDAP  01/16/2022  . COLONOSCOPY  12/01/2025  . INFLUENZA VACCINE  Completed  . Hepatitis C Screening  Completed  . HIV Screening  Completed    Health Maintenance  Health Maintenance Due  Topic Date Due  . COVID-19 Vaccine (3 - Booster for Pfizer series) 11/13/2019   Will be due for bone density study next year when she turns 42.   Lung Cancer Screening: (Low Dose CT Chest recommended if Age 31-80 years, 30 pack-year currently smoking OR have quit w/in 15years.) does not qualify.  Smoked for 14 yrs never over 1 pk a day  Additional Screening:  Hepatitis C Screening: does qualify; Completed already.  Vision Screening: Recommended annual ophthalmology exams for early detection of glaucoma and other disorders of the eye. Is the patient up to date with their annual eye exam?  Yes  Who is the provider or what is the name of the office in which the patient attends annual eye exams? Dr. Schuyler Amor If pt is not established with a provider, would they like to be referred to a provider to establish care? NA.   Dental  Screening: Recommended annual dental exams for proper oral hygiene.  - [facility] her provider.  Community Resource Referral / Chronic Care Management: CRR required this visit?   No  CCM required this visit?  No      Plan:    1. Encounter for Medicare annual wellness exam   2. Tobacco dependence Patient has set goals for quitting by the first of 2022.  Encouraged her to continue cutting back with that goal in mind.  She will continue using the nicotine gum to help decrease craving.  3. Dietary counseling Dietary counseling given.  Encouraged her to eat healthier snacks like fruits or nuts rather than popcorn.  4. Advance directive declined by patient Patient declined discussion about advanced directive.  I told her when she is ready to have the discussion let me know  5. 23-polyvalent pneumococcal polysaccharide vaccine declined This was recommended.  Patient declined.  In regards to her anemia: Patient has been taking her iron supplement consistently since we last spoke a month ago.  I told her she can now change to taking it 3 times a week.  I have personally reviewed and noted the following in the patient's chart:   . Medical and social history . Use of  alcohol, tobacco or illicit drugs  . Current medications and supplements . Functional ability and status . Nutritional status . Physical activity . Advanced directives . List of other physicians . Hospitalizations, surgeries, and ER visits in previous 12 months . Vitals . Screenings to include cognitive, depression, and falls . Referrals and appointments  In addition, I have reviewed and discussed with patient certain preventive protocols, quality metrics, and best practice recommendations. A written personalized care plan for preventive services as well as general preventive health recommendations were provided to patient.     Karle Plumber, MD   12/26/2019

## 2019-12-26 NOTE — Telephone Encounter (Signed)
Pt called asking for a refill of her tylenol #3 for her shoulder pain and she would like to be notified when this is done please  [phone]

## 2019-12-26 NOTE — Telephone Encounter (Signed)
Please called about update for tylenol 3. Please send to pharmacy on file [facility] on [address]. Patient phone number is [phone].

## 2019-12-26 NOTE — Telephone Encounter (Signed)
Sent in

## 2019-12-26 NOTE — Therapy (Addendum)
[place], [place], [address] Phone: [phone]   Fax:  [phone]  Physical Therapy Evaluation  Patient Details  Name: Alison Ford MRN: [id number] Date of Birth: 1955/04/23 Referring Provider (PT): Dr Meredith Pel   Encounter Date: 12/25/2019   PT End of Session - 12/25/19 1028    Visit Number 1    Number of Visits 12    Date for PT Re-Evaluation 02/05/20    Authorization Type MCD.MCR    PT Start Time 1017    PT Stop Time 1100    PT Time Calculation (min) 43 min    Activity Tolerance Patient tolerated treatment well    Behavior During Therapy WFL for tasks assessed/performed           Past Medical History:  Diagnosis Date  . Abnormal facial hair   . Back pain    d/t knee pain  . Headache(784.0)   . Heart murmur    slight  . HTN (hypertension)    takes Amlodipine daily and HCTZ   . Hyperlipidemia    takes Lovastatin nightly  . Joint pain   . Right knee DJD   . Weakness    left hand    Past Surgical History:  Procedure Laterality Date  . ABDOMINAL HYSTERECTOMY    . Athroscopic knee surgery     Bilateral  . CARPAL TUNNEL RELEASE Right 05/02/2016   Procedure: CARPAL TUNNEL RELEASE;  Surgeon: Meredith Pel, MD;  Location: [facility];  Service: Orthopedics;  Laterality: Right;  . CHOLECYSTECTOMY    . DILATION AND CURETTAGE OF UTERUS    . EYE SURGERY Right   . SHOULDER ARTHROSCOPY WITH SUBACROMIAL DECOMPRESSION Right 05/02/2016   Procedure: RIGHT SHOULDER ARTHROSCOPY WITH SUBACROMIAL DECOMPRESSION, MINI OPEN ROTATOR CUFF TEAR REPAIR, AND RIGHT CARPAL TUNNEL RELEASE;  Surgeon: Meredith Pel, MD;  Location: [facility];  Service: Orthopedics;  Laterality: Right;  . TOTAL KNEE ARTHROPLASTY Right 07/15/2012   Procedure: RIGHT TOTAL KNEE ARTHROPLASTY;  Surgeon: Lorn Junes, MD;  Location: [facility];  Service: Orthopedics;  Laterality: Right;    There were no vitals filed for this  visit.    Subjective Assessment - 12/25/19 1021    Subjective Patient has had right shoulder pain for about 9 months now. The pain increses when she reachesup and uses her arm. She has increased pain with squeezing. the pain radiates into her foreaarm. She can have tigling into her hand at times.    Pertinent History riht TKA, right RTC repair, left hand weakness ( nspecified)    Limitations Writing;House hold activities    How long can you sit comfortably? N/A    How long can you stand comfortably? N/A    How long can you walk comfortably? N/A    Diagnostic tests MRI: multi level dgeneration see MRI    Patient Stated Goals to have less pain    Currently in Pain? Yes    Pain Score 8     Pain Location Neck    Pain Orientation Right    Pain Descriptors / Indicators Aching    Pain Type Chronic pain    Pain Onset More than a month ago    Pain Frequency Constant    Aggravating Factors  use of the arm    Pain Relieving Factors tylenol; salon patches, bio-freeze    Effect of Pain on Daily Activities difficulty using dominat hand  [facility] PT Assessment - 12/26/19 0001      Assessment   Medical Diagnosis Right Cervical Radiculopathy    Referring Provider (PT) Dr Meredith Pel    Onset Date/Surgical Date --   9 months ago   Hand Dominance Right    Next MD Visit Will schedule after therapy    Prior Therapy For lower back and knee      Precautions   Precautions None      Restrictions   Weight Bearing Restrictions No      Home Environment   Additional Comments nothing pertinant to the shoulder      Prior Function   Level of Independence Independent    Vocation Retired      Associate Professor   Overall Cognitive Status Within Functional Limits for tasks assessed    Attention Focused    Focused Attention Appears intact    Memory Appears intact    Awareness Appears intact    Problem Solving Appears intact      Sensation   Light Touch Appears Intact    Additional  Comments tingling into the hand sometimes      Coordination   Gross Motor Movements are Fluid and Coordinated Yes    Fine Motor Movements are Fluid and Coordinated Yes      AROM   Right Shoulder Flexion 90 Degrees    Right Shoulder Internal Rotation --   can reach outer hip with pain   Right Shoulder External Rotation --   has to move her head forward to reach the back of her head   Cervical Flexion 40    Cervical Extension 18   painful   Cervical - Right Rotation 58    Cervical - Left Rotation 70      PROM   Overall PROM Comments all PROM with pain at end ranges    PROM Assessment Site Shoulder    Right/Left Shoulder Right    Right Shoulder Flexion 120 Degrees    Right Shoulder ABduction 89 Degrees    Right Shoulder External Rotation 35 Degrees      Strength   Right Shoulder Flexion 3/5    Right Shoulder Internal Rotation 3+/5    Right Shoulder External Rotation 3+/5    Left Shoulder Flexion 4+/5    Left Shoulder Internal Rotation 4+/5    Left Shoulder External Rotation 4+/5      Palpation   Palpation comment signifcant spasming in upper trap. Tenderness to palpation in the anteriro shoulder and upper trap      Special Tests   Other special tests empty can (-); [place] (+) but limited range                      Objective measurements completed on examination: See above findings.       [facility] Adult PT Treatment/Exercise - 12/26/19 0001      Exercises   Exercises Neck      Neck Exercises: Seated   Other Seated Exercise seated scap retractions 2x20      Manual Therapy   Manual Therapy Manual Traction;Soft tissue mobilization;Joint mobilization    Joint Mobilization inferior and posterior glides    Soft tissue mobilization trigger point release to the upper trap and cervical spine    Manual Traction gentle manual traction to the cervical spine      Neck Exercises: Stretches   Upper Trapezius Stretch 3 reps;20 seconds    Levator Stretch 3 reps;20  seconds  PT Education - 12/25/19 1027    Education Details HEP, symptom mnagement    Person(s) Educated Patient    Methods Explanation;Demonstration;Verbal cues;Tactile cues    Comprehension Verbalized understanding;Returned demonstration;Verbal cues required;Tactile cues required            PT Short Term Goals - 12/26/19 1010      PT SHORT TERM GOAL #1   Title Patient will increase active shoulder flexion to 100 degrees    Time 3    Period Weeks    Status New    Target Date 01/16/20      PT SHORT TERM GOAL #2   Title Patient will increase right cervical rotation by 10 segrees    Time 3    Period Weeks    Status New    Target Date 01/16/20      PT SHORT TERM GOAL #3   Title Patient will report a 50% reduction in right shoulder pain with activity below 90 degrees    Time 6    Period Weeks    Status New    Target Date 02/06/20             PT Long Term Goals - 12/26/19 1012      PT LONG TERM GOAL #1   Title Patient will use her right arm to rach a shelf overhead with <2/10 pain    Time 6    Period Weeks    Status New    Target Date 02/06/20      PT LONG TERM GOAL #2   Title Patient will reach behind her head without increased pain in order to do her hair    Time 6    Period Weeks    Status New    Target Date 02/06/20      PT LONG TERM GOAL #3   Title Patient will reach behind the back without increased pain in order to perfrom ADL's    Time 6    Period Weeks    Status New    Target Date 02/06/20                  Plan - 12/25/19 1035    Clinical Impression Statement Patient presents with right shoulder pain and upper trap spasming. She has limited active flexion, abduction internal and external rotation. She has a significant trigger point in her upper trap. She also has tenderness in her anterior shoulder. Signs and symptoms are consitenant with cerivcal radiculopathy but shoulder involvement also can not be ruled out,  She would benefit from skilled therapy to improve functional movmenet of her shoulder and decrease pain with ADL's    Personal Factors and Comorbidities Comorbidity 1;Comorbidity 2;Comorbidity 3+    Comorbidities right TKA, Right RTC repair, low back pain    Examination-Activity Limitations Carry;Reach Overhead    Examination-Participation Restrictions Cleaning;Community Activity;Laundry;Shop    Stability/Clinical Decision Making Evolving/Moderate complexity    Clinical Decision Making Moderate    Rehab Potential Good    PT Frequency 2x / week    PT Duration 6 weeks    PT Treatment/Interventions ADLs/Self Care Home Management;Electrical Stimulation;Cryotherapy;Moist Heat;Ultrasound;Traction;DME Instruction;Stair training;Functional mobility training;Iontophoresis 4mg /ml Dexamethasone;Therapeutic activities;Therapeutic exercise;Balance training;Neuromuscular re-education;Patient/family education;Manual techniques;Passive range of motion;Dry needling;Splinting;Taping    PT Next Visit Plan Patient had a positive respose to mobilization of the shoulder, trigger point relase to the upper trap, and gentle manual distraction of her cervical spine; add scap retraction; consider wand flexion if tolerated. add shoulder extension; review cervical  stretching    PT Home Exercise Plan upper trap stretch; levator stretch; scpa retraction    Consulted and Agree with Plan of Care Patient           Patient will benefit from skilled therapeutic intervention in order to improve the following deficits and impairments:  Decreased range of motion,Impaired UE functional use,Increased muscle spasms,Decreased endurance,Decreased activity tolerance,Pain,Decreased strength,Postural dysfunction  Visit Diagnosis: Radiculopathy, cervical region  Chronic right shoulder pain  Stiffness of right shoulder, not elsewhere classified  Cramp and spasm     Problem List Patient Active Problem List   Diagnosis Date Noted   . Class 1 obesity due to excess calories with serious comorbidity and body mass index (BMI) of 31.0 to 31.9 in adult 05/15/2019  . Prediabetes 05/15/2019  . Iron deficiency anemia 05/15/2019  . Systolic ejection murmur 01/00/7121  . Hypoxemia requiring supplemental oxygen 05/15/2019  . Normocytic anemia 04/29/2019  . Chronic obstructive pulmonary disease ([place]) 05/30/2016  . Rotator cuff tear 05/02/2016  . Rotator cuff tendonitis, right 12/30/2015  . Cervicalgia 12/30/2015  . Eczema, dyshidrotic 11/18/2014  . Acne 11/18/2014  . Smoker 06/01/2014  . Right knee DJD   . Hyperlipidemia   . HTN (hypertension) 06/05/2012    Carney Living PT DPT  12/26/2019, 10:16 AM  [facility] [address] [place], [place], [address] Phone: [phone]   Fax:  [phone]  Name: Alison Ford MRN: [id number] Date of Birth: 1955-07-04       Referring diagnosis? M25.511  Treatment diagnosis? (if different than referring diagnosis) m54.12 , m25.511, m25.611, R25.2 What was this (referring dx) caused by? []  Surgery []  Fall [x]  Ongoing issue []  Arthritis []  Other: ____________  Laterality: [x]  Rt []  Lt []  Both  Check all possible CPT codes:      []  97110 (Therapeutic Exercise)  []  92507 (SLP Treatment)  []  97112 (Neuro Re-ed)   []  92526 (Swallowing Treatment)   []  97116 (Gait Training)   []  [id number] (Cognitive Training, 1st 15 minutes) []  97140 (Manual Therapy)   []  97130 (Cognitive Training, each add'l 15 minutes)  []  97530 (Therapeutic Activities)  []  Other, List CPT Code ____________    []  94585 (Self Care)       [x]  All codes above (97110 - 97535)  []  97012 (Mechanical Traction)  [x]  97014 (E-stim Unattended)  []  97032 (E-stim manual)  [x]  97033 (Ionto)  [x]  97035 (Ultrasound)  []  97760 (Orthotic Fit) []  97750 (Physical Performance Training) []  [id number] (Aquatic Therapy) []  97034 (Contrast Bath) []  [id number] (Paraffin) []   97597 (Wound Care 1st 20 sq cm) []  97598 (Wound Care each add'l 20 sq cm) []  97016 (Vasopneumatic Device) []  [id number] (Orthotic Training) []  [id number] (Prosthetic Training)    Starr Lake PT, DPT, LAT, ATC  01/20/20  10:53 AM

## 2019-12-26 NOTE — Telephone Encounter (Signed)
Please advise. Thanks.  

## 2019-12-29 NOTE — Telephone Encounter (Signed)
Tried calling

## 2020-01-13 DIAGNOSIS — J449 Chronic obstructive pulmonary disease, unspecified: Secondary | ICD-10-CM | POA: Diagnosis not present

## 2020-01-14 ENCOUNTER — Other Ambulatory Visit: Payer: Self-pay | Admitting: Internal Medicine

## 2020-01-14 DIAGNOSIS — E785 Hyperlipidemia, unspecified: Secondary | ICD-10-CM

## 2020-01-15 ENCOUNTER — Ambulatory Visit: Payer: Medicare HMO | Admitting: Physical Therapy

## 2020-01-20 ENCOUNTER — Ambulatory Visit: Payer: Medicare HMO | Admitting: Physical Therapy

## 2020-01-22 ENCOUNTER — Other Ambulatory Visit: Payer: Self-pay

## 2020-01-22 ENCOUNTER — Ambulatory Visit: Payer: Medicare HMO | Attending: Orthopedic Surgery | Admitting: Physical Therapy

## 2020-01-22 ENCOUNTER — Encounter: Payer: Self-pay | Admitting: Physical Therapy

## 2020-01-22 DIAGNOSIS — M25511 Pain in right shoulder: Secondary | ICD-10-CM | POA: Diagnosis not present

## 2020-01-22 DIAGNOSIS — M5412 Radiculopathy, cervical region: Secondary | ICD-10-CM | POA: Diagnosis not present

## 2020-01-22 DIAGNOSIS — M6281 Muscle weakness (generalized): Secondary | ICD-10-CM | POA: Diagnosis not present

## 2020-01-22 DIAGNOSIS — M25611 Stiffness of right shoulder, not elsewhere classified: Secondary | ICD-10-CM | POA: Insufficient documentation

## 2020-01-22 DIAGNOSIS — R252 Cramp and spasm: Secondary | ICD-10-CM | POA: Diagnosis not present

## 2020-01-22 DIAGNOSIS — G8929 Other chronic pain: Secondary | ICD-10-CM | POA: Insufficient documentation

## 2020-01-22 NOTE — Patient Instructions (Signed)
Trigger Point Dry Needling  . What is Trigger Point Dry Needling (DN)? o DN is a physical therapy technique used to treat muscle pain and dysfunction. Specifically, DN helps deactivate muscle trigger points (muscle knots).  o A thin filiform needle is used to penetrate the skin and stimulate the underlying trigger point. The goal is for a local twitch response (LTR) to occur and for the trigger point to relax. No medication of any kind is injected during the procedure.   . What Does Trigger Point Dry Needling Feel Like?  o The procedure feels different for each individual patient. Some patients report that they do not actually feel the needle enter the skin and overall the process is not painful. Very mild bleeding may occur. However, many patients feel a deep cramping in the muscle in which the needle was inserted. This is the local twitch response.   Marland Kitchen How Will I feel after the treatment? o Soreness is normal, and the onset of soreness may not occur for a few hours. Typically this soreness does not last longer than two days.  o Bruising is uncommon, however; ice can be used to decrease any possible bruising.  o In rare cases feeling tired or nauseous after the treatment is normal. In addition, your symptoms may get worse before they get better, this period will typically not last longer than 24 hours.   . What Can I do After My Treatment? o Increase your hydration by drinking more water for the next 24 hours. o You may place ice or heat on the areas treated that have become sore, however, do not use heat on inflamed or bruised areas. Heat often brings more relief post needling. o You can continue your regular activities, but vigorous activity is not recommended initially after the treatment for 24 hours. o DN is best combined with other physical therapy such as strengthening, stretching, and other therapies.   Over Head Pull: Narrow Grip       On back, knees bent, feet flat, band across  thighs, elbows straight but relaxed. Pull hands apart (start). Keeping elbows straight, bring arms up and over head, hands toward floor. Keep pull steady on band. Hold momentarily. Return slowly, keeping pull steady, back to start. Repeat 10___ times. Band color _red_____   Side Pull: Double Arm   On back, knees bent, feet flat. Arms perpendicular to body, shoulder level, elbows straight but relaxed. Pull arms out to sides, elbows straight. Resistance band comes across collarbones, hands toward floor. Hold momentarily. Slowly return to starting position. Repeat 10___ times. Band color _red____   Alison Ford   On back, knees bent, feet flat, left hand on left hip, right hand above left. Pull right arm DIAGONALLY (hip to shoulder) across chest. Bring right arm along head toward floor. Hold momentarily. Slowly return to starting position. Repeat _10__ times. Do with left arm. Band color red______   Shoulder Rotation: Double Arm   On back, knees bent, feet flat, elbows tucked at sides, bent 90, hands palms up. Pull hands apart and down toward floor, keeping elbows near sides. Hold momentarily. Slowly return to starting position. Repeat _10__ times. Band color ___red___   Alison Ford, PT, [facility] Certified Exercise Expert for the Aging Adult  01/22/20 11:38 AM Phone: [phone] Fax: [phone]

## 2020-01-22 NOTE — Therapy (Signed)
[place], [place], [address] Phone: [phone]   Fax:  [phone]  Physical Therapy Treatment  Patient Details  Name: Alison Ford MRN: [id number] Date of Birth: Aug 28, 1955 Referring Provider (PT): Dr Meredith Pel   Encounter Date: 01/22/2020   PT End of Session - 01/22/20 1113    Visit Number 2    Number of Visits 12    Date for PT Re-Evaluation 02/05/20    Authorization Type MCD.MCR    PT Start Time 1111    PT Stop Time 1205    PT Time Calculation (min) 54 min    Activity Tolerance Patient tolerated treatment well    Behavior During Therapy WFL for tasks assessed/performed           Past Medical History:  Diagnosis Date  . Abnormal facial hair   . Back pain    d/t knee pain  . Headache(784.0)   . Heart murmur    slight  . HTN (hypertension)    takes Amlodipine daily and HCTZ   . Hyperlipidemia    takes Lovastatin nightly  . Joint pain   . Right knee DJD   . Weakness    left hand    Past Surgical History:  Procedure Laterality Date  . ABDOMINAL HYSTERECTOMY    . Athroscopic knee surgery     Bilateral  . CARPAL TUNNEL RELEASE Right 05/02/2016   Procedure: CARPAL TUNNEL RELEASE;  Surgeon: Meredith Pel, MD;  Location: [facility];  Service: Orthopedics;  Laterality: Right;  . CHOLECYSTECTOMY    . DILATION AND CURETTAGE OF UTERUS    . EYE SURGERY Right   . SHOULDER ARTHROSCOPY WITH SUBACROMIAL DECOMPRESSION Right 05/02/2016   Procedure: RIGHT SHOULDER ARTHROSCOPY WITH SUBACROMIAL DECOMPRESSION, MINI OPEN ROTATOR CUFF TEAR REPAIR, AND RIGHT CARPAL TUNNEL RELEASE;  Surgeon: Meredith Pel, MD;  Location: [facility];  Service: Orthopedics;  Laterality: Right;  . TOTAL KNEE ARTHROPLASTY Right 07/15/2012   Procedure: RIGHT TOTAL KNEE ARTHROPLASTY;  Surgeon: Lorn Junes, MD;  Location: [facility];  Service: Orthopedics;  Laterality: Right;    There were no vitals filed for this  visit.   Subjective Assessment - 01/22/20 1113    Subjective I have not been able to come in because of myCOPD and I was congested and coughing and I had to use my oxygen a lot.  So I am just now coming back    Pertinent History riht TKA, right RTC repair, left hand weakness ( nspecified)    Limitations Writing;House hold activities    How long can you sit comfortably? N/A    How long can you stand comfortably? N/A    How long can you walk comfortably? N/A    Diagnostic tests MRI: multi level dgeneration see MRI    Patient Stated Goals to have less pain    Currently in Pain? Yes    Pain Score 8     Pain Location Shoulder    Pain Orientation Right    Pain Descriptors / Indicators Aching;Throbbing;Numbness    Pain Type Chronic pain    Pain Onset More than a month ago    Pain Frequency Constant    Multiple Pain Sites Yes    Pain Score 8    Pain Location Neck    Pain Orientation Right    Pain Descriptors / Indicators Aching;Throbbing;Numbness    Pain Type Chronic pain    Pain Onset More than a month ago  Pain Frequency Constant              [facility] PT Assessment - 01/22/20 0001      AROM   Cervical - Right Rotation 64    Cervical - Left Rotation 70                         [facility] Adult PT Treatment/Exercise - 01/22/20 0001      Neck Exercises: Supine   Other Supine Exercise supine scapular stabilizers, bil shld flex, horiizontal abd, diagonals and bil eR x 10 each with red t band with cues for breathing.    Other Supine Exercise supine chin tucks 10sec hold      Moist Heat Therapy   Number Minutes Moist Heat 15 Minutes    Moist Heat Location Cervical      Manual Therapy   Manual Therapy Manual Traction;Soft tissue mobilization;Joint mobilization    Manual therapy comments skilled palpation for TPDN    Joint Mobilization Lateral UPA on R grade 2/3    Soft tissue mobilization trigger point release to the upper trap and cervical spine    Manual Traction  gentle manual traction to the cervical spine      Neck Exercises: Stretches   Upper Trapezius Stretch 3 reps;20 seconds    Levator Stretch 3 reps;20 seconds            Trigger Point Dry Needling - 01/22/20 0001    Consent Given? Yes    Education Handout Provided Yes    Muscles Treated Head and Neck Upper trapezius;Levator scapulae   right only   Dry Needling Comments 40 mm ,25 gage    Upper Trapezius Response Twitch reponse elicited;Palpable increased muscle length    Levator Scapulae Response Twitch response elicited;Palpable increased muscle length                PT Education - 01/22/20 1138    Education Details added to HEP supine scap stabilizers with chin tuck   TPDN education and precautians and aftercare    Person(s) Educated Patient    Methods Explanation;Demonstration;Tactile cues;Verbal cues;Handout    Comprehension Verbalized understanding;Returned demonstration            PT Short Term Goals - 01/22/20 1205      PT SHORT TERM GOAL #1   Title Patient will increase active shoulder flexion to 100 degrees    Time 3    Period Weeks    Status On-going    Target Date 01/16/20      PT SHORT TERM GOAL #2   Title Patient will increase right cervical rotation by 10 segrees    Baseline R rotattion 58 to 64 degrees    Time 3    Period Weeks    Status On-going      PT SHORT TERM GOAL #3   Title Patient will report a 50% reduction in right shoulder pain with activity below 90 degrees    Baseline pt able to put on coat without exacerbating pain after TPDN   pain from 8/10 to 3/10    Time 6    Period Weeks    Status Achieved    Target Date 02/06/20             PT Long Term Goals - 12/26/19 1012      PT LONG TERM GOAL #1   Title Patient will use her right arm to rach a shelf overhead with <2/10 pain  Time 6    Period Weeks    Status New    Target Date 02/06/20      PT LONG TERM GOAL #2   Title Patient will reach behind her head without increased  pain in order to do her hair    Time 6    Period Weeks    Status New    Target Date 02/06/20      PT LONG TERM GOAL #3   Title Patient will reach behind the back without increased pain in order to perfrom ADL's    Time 6    Period Weeks    Status New    Target Date 02/06/20                 Plan - 01/22/20 1118    Clinical Impression Statement Pt presents with 8/10 pain in R neck and shoulder and barely moves neck or shoulders while walking to clinic. Pt consents to TPDN and has immediate relief and ability turn head and lift arm with decreased pain to put on coat. STG # 3 achieved due to 505 reduction in pain this visit.   Pt R rotation cervical from 58 to 64 degrees.   Will continue POC    Personal Factors and Comorbidities Comorbidity 1;Comorbidity 2;Comorbidity 3+    Comorbidities right TKA, Right RTC repair, low back pain    Examination-Activity Limitations Carry;Reach Overhead    Examination-Participation Restrictions Cleaning;Community Activity;Laundry;Shop    PT Frequency 2x / week    PT Duration 6 weeks    PT Treatment/Interventions ADLs/Self Care Home Management;Electrical Stimulation;Cryotherapy;Moist Heat;Ultrasound;Traction;DME Instruction;Stair training;Functional mobility training;Iontophoresis 4mg /ml Dexamethasone;Therapeutic activities;Therapeutic exercise;Balance training;Neuromuscular re-education;Patient/family education;Manual techniques;Passive range of motion;Dry needling;Splinting;Taping    PT Next Visit Plan Patient had a positive respose to mobilization of the shoulder, trigger point relase to the upper trap, and gentle manual distraction of her cervical spine; add scap retraction; consider wand flexion if tolerated. add shoulder extension; review cervical stretching    PT Home Exercise Plan upper trap stretch; levator stretch; scpa retraction supine scap stabilizers    Consulted and Agree with Plan of Care Patient           Patient will benefit from  skilled therapeutic intervention in order to improve the following deficits and impairments:  Decreased range of motion,Impaired UE functional use,Increased muscle spasms,Decreased endurance,Decreased activity tolerance,Pain,Decreased strength,Postural dysfunction  Visit Diagnosis: Radiculopathy, cervical region  Chronic right shoulder pain  Stiffness of right shoulder, not elsewhere classified  Cramp and spasm  Muscle weakness (generalized)     Problem List Patient Active Problem List   Diagnosis Date Noted  . Class 1 obesity due to excess calories with serious comorbidity and body mass index (BMI) of 31.0 to 31.9 in adult 05/15/2019  . Prediabetes 05/15/2019  . Iron deficiency anemia 05/15/2019  . Systolic ejection murmur 123456  . Hypoxemia requiring supplemental oxygen 05/15/2019  . Normocytic anemia 04/29/2019  . Chronic obstructive pulmonary disease ([place]) 05/30/2016  . Rotator cuff tear 05/02/2016  . Rotator cuff tendonitis, right 12/30/2015  . Cervicalgia 12/30/2015  . Eczema, dyshidrotic 11/18/2014  . Acne 11/18/2014  . Smoker 06/01/2014  . Right knee DJD   . Hyperlipidemia   . HTN (hypertension) 06/05/2012   Alison Ford, PT, [place] Certified Exercise Expert for the Aging Adult  01/22/20 12:10 PM Phone: [phone] Fax: [facility] [address] [place], [place], [address] Phone: [phone]   Fax:  [phone]  Name: Alison Feltz  Ford MRN: [id number] Date of Birth: [birth year]

## 2020-01-26 ENCOUNTER — Other Ambulatory Visit: Payer: Self-pay | Admitting: Internal Medicine

## 2020-01-26 DIAGNOSIS — J449 Chronic obstructive pulmonary disease, unspecified: Secondary | ICD-10-CM

## 2020-01-27 ENCOUNTER — Ambulatory Visit: Payer: Medicare HMO | Admitting: Physical Therapy

## 2020-01-27 NOTE — Telephone Encounter (Signed)
Requested medication (s) are due for refill today:  Requested medication (s) are on the active medication list:yes  Last refill: 11/21/19  #60  3 refills  Future visit scheduled no  Notes to clinic: off protocol  Requested Prescriptions  Pending Prescriptions Disp [place] 100-62.5-25 MCG/INH AEPB [Pharmacy Med Name: TRELEGY ELLIPTA 100-62.5-25 MCG/INH Aerosol Powder Breath Activated] 180 each     Sig: Inhale 1 puff into the lungs every morning.      Off-Protocol Failed - 01/26/2020  7:25 PM      Failed - Medication not assigned to a protocol, review manually.      Passed - Valid encounter within last 12 months    Recent Outpatient Visits           1 month ago Encounter for [employer] annual wellness exam   [place] Ladell Pier, MD   2 months ago Essential hypertension   [place], Deborah B, MD   6 months ago Chronic obstructive pulmonary disease, unspecified COPD type [facility])   [place], MD   8 months ago Chronic obstructive pulmonary disease, unspecified COPD type [facility])   [place], MD   9 months ago Chronic obstructive pulmonary disease, unspecified COPD type [facility])   [place], MD       Future Appointments             In 3 months Wynetta Emery, Dalbert Batman, MD [place]

## 2020-01-29 ENCOUNTER — Ambulatory Visit: Payer: Medicare HMO | Admitting: Physical Therapy

## 2020-02-03 ENCOUNTER — Ambulatory Visit: Payer: Medicare HMO | Admitting: Physical Therapy

## 2020-02-05 ENCOUNTER — Ambulatory Visit: Payer: Medicare HMO | Admitting: Physical Therapy

## 2020-02-06 ENCOUNTER — Encounter: Payer: Self-pay | Admitting: Nurse Practitioner

## 2020-02-06 ENCOUNTER — Other Ambulatory Visit: Payer: Self-pay

## 2020-02-06 ENCOUNTER — Ambulatory Visit: Payer: Medicare HMO | Attending: Nurse Practitioner | Admitting: Nurse Practitioner

## 2020-02-06 DIAGNOSIS — K219 Gastro-esophageal reflux disease without esophagitis: Secondary | ICD-10-CM | POA: Diagnosis not present

## 2020-02-06 DIAGNOSIS — J440 Chronic obstructive pulmonary disease with acute lower respiratory infection: Secondary | ICD-10-CM

## 2020-02-06 DIAGNOSIS — J209 Acute bronchitis, unspecified: Secondary | ICD-10-CM

## 2020-02-06 MED ORDER — PREDNISONE 20 MG PO TABS: 20.0000 mg | ORAL_TABLET | Freq: Every day | ORAL | 0 refills | Status: AC

## 2020-02-06 MED ORDER — BENZONATATE 200 MG PO CAPS: 200.0000 mg | ORAL_CAPSULE | Freq: Two times a day (BID) | ORAL | 0 refills | Status: DC | PRN

## 2020-02-06 MED ORDER — PANTOPRAZOLE SODIUM 40 MG PO TBEC: 40.0000 mg | DELAYED_RELEASE_TABLET | Freq: Every day | ORAL | 0 refills | Status: DC

## 2020-02-06 MED ORDER — PROMETHAZINE-DM 6.25-15 MG/5ML PO SYRP: 2.5000 mL | ORAL_SOLUTION | Freq: Two times a day (BID) | ORAL | 0 refills | Status: DC | PRN

## 2020-02-06 NOTE — Progress Notes (Signed)
Virtual Visit via Telephone Note Due to national recommendations of social distancing due to [place] 19, telehealth visit is felt to be most appropriate for this patient at this time.  I discussed the limitations, risks, security and privacy concerns of performing an evaluation and management service by telephone and the availability of in person appointments. I also discussed with the patient that there may be a patient responsible charge related to this service. The patient expressed understanding and agreed to proceed.    I connected with Alison Ford on 02/06/20  at  11:10 AM EST  EDT by telephone and verified that I am speaking with the correct person using two identifiers.   Consent I discussed the limitations, risks, security and privacy concerns of performing an evaluation and management service by telephone and the availability of in person appointments. I also discussed with the patient that there may be a patient responsible charge related to this service. The patient expressed understanding and agreed to proceed.   Location of Patient: Private Residence   Location of Provider: [place] [facility] [employer] Office    Persons participating in Telemedicine visit: Geryl Rankins FNP-BC [place]    History of Present Illness: Telemedicine visit for: Cough She has history of chronic O2 dep, COPD, HTN, HL, tobacco dependence, Anemia, and prediabetes   Cough: Patient complains of cough. Requesting chest xray but denies fever, fatigue or purulent sputum. Symptoms began 1 week ago. She has been vaccinated but did not receive the booster. Cough described as productive of clear sputum, with wheezing and worse at night. Patient denies chills, fever, sore throat and sweats. Associated symptoms include nasal congestion, post nasal drip, sinus pressure.   Patient has a history of COPD. Current treatments have included Trelegy inhaler once a day, albuterol prn with  inadequate improvement. She has not used her albuterol nebs. She does smoke cigarettes and is on chronic O2 2L. She has also been out of her pantoprazole. ?GERD symptoms.   Past Medical History:  Diagnosis Date   Abnormal facial hair    Back pain    d/t knee pain   Headache(784.0)    Heart murmur    slight   HTN (hypertension)    takes Amlodipine daily and HCTZ    Hyperlipidemia    takes Lovastatin nightly   Joint pain    Right knee DJD    Weakness    left hand    Past Surgical History:  Procedure Laterality Date   ABDOMINAL HYSTERECTOMY     Athroscopic knee surgery     Bilateral   CARPAL TUNNEL RELEASE Right 05/02/2016   Procedure: CARPAL TUNNEL RELEASE;  Surgeon: Meredith Pel, MD;  Location: [place];  Service: Orthopedics;  Laterality: Right;   CHOLECYSTECTOMY     DILATION AND CURETTAGE OF UTERUS     EYE SURGERY Right    SHOULDER ARTHROSCOPY WITH SUBACROMIAL DECOMPRESSION Right 05/02/2016   Procedure: RIGHT SHOULDER ARTHROSCOPY WITH SUBACROMIAL DECOMPRESSION, MINI OPEN ROTATOR CUFF TEAR REPAIR, AND RIGHT CARPAL TUNNEL RELEASE;  Surgeon: Meredith Pel, MD;  Location: [place];  Service: Orthopedics;  Laterality: Right;   TOTAL KNEE ARTHROPLASTY Right 07/15/2012   Procedure: RIGHT TOTAL KNEE ARTHROPLASTY;  Surgeon: Lorn Junes, MD;  Location: [place];  Service: Orthopedics;  Laterality: Right;    Family History  Problem Relation Age of Onset   CAD Father 66   CAD Mother 80   Hypertension Mother     Social  History   Socioeconomic History   Marital status: Legally Separated    Spouse name: Not on file   Number of children: 3   Years of education: Not on file   Highest education level: Not on file  Occupational History    Employer: [employer]  Tobacco Use   Smoking status: Current Every Day Smoker    Packs/day: 0.50    Years: 15.00    Pack years: 7.50    Types: Cigarettes   Smokeless tobacco: Never Used   Tobacco comment: Smoking 3-4  cigs/day  Substance and Sexual Activity   Alcohol use: Yes    Alcohol/week: 0.0 standard drinks    Comment: ocassionally   Drug use: No   Sexual activity: Never  Other Topics Concern   Not on file  Social History Narrative   Lives with daughter and two grandsons.    Social Determinants of Health   Financial Resource Strain: Not on file  Food Insecurity: Not on file  Transportation Needs: Not on file  Physical Activity: Not on file  Stress: Not on file  Social Connections: Not on file     Observations/Objective: Awake, alert and oriented x 3   Review of Systems  Constitutional: Negative for fever, malaise/fatigue and weight loss.  HENT: Negative.  Negative for nosebleeds.   Eyes: Negative.  Negative for blurred vision, double vision and photophobia.  Respiratory: Positive for cough, sputum production (clear), shortness of breath and wheezing.   Cardiovascular: Negative.  Negative for chest pain, palpitations and leg swelling.  Gastrointestinal: Negative.  Negative for heartburn, nausea and vomiting.  Musculoskeletal: Negative.  Negative for myalgias.  Neurological: Negative.  Negative for dizziness, focal weakness, seizures and headaches.  Psychiatric/Behavioral: Negative.  Negative for suicidal ideas.    Assessment and Plan: [name] was seen today for cough.  Diagnoses and all orders for this visit:  Acute bronchitis with COPD ([place]) -     predniSONE (DELTASONE) 20 MG tablet; Take 1 tablet (20 mg total) by mouth daily with breakfast for 5 days. -     benzonatate (TESSALON) 200 MG capsule; Take 1 capsule (200 mg total) by mouth 2 (two) times daily as needed for cough. -     promethazine-dextromethorphan (PROMETHAZINE-DM) 6.25-15 MG/5ML syrup; Take 2.5 mLs by mouth every 12 (twelve) hours as needed for cough. Encouraged her to use her nebs as well  GERD without esophagitis -     pantoprazole (PROTONIX) 40 MG tablet; Take 1 tablet (40 mg total) by mouth  daily. INSTRUCTIONS: Avoid GERD Triggers: acidic, spicy or fried foods, caffeine, coffee, sodas,  alcohol and chocolate.    Follow Up Instructions Return if symptoms worsen or fail to improve.     I discussed the assessment and treatment plan with the patient. The patient was provided an opportunity to ask questions and all were answered. The patient agreed with the plan and demonstrated an understanding of the instructions.   The patient was advised to call back or seek an in-person evaluation if the symptoms worsen or if the condition fails to improve as anticipated.  I provided 13 minutes of non-face-to-face time during this encounter including median intraservice time, reviewing previous notes, labs, imaging, medications and explaining diagnosis and management.  Gildardo Pounds, FNP-BC

## 2020-02-10 ENCOUNTER — Other Ambulatory Visit: Payer: Self-pay | Admitting: Internal Medicine

## 2020-02-10 DIAGNOSIS — I10 Essential (primary) hypertension: Secondary | ICD-10-CM

## 2020-02-12 ENCOUNTER — Other Ambulatory Visit: Payer: Self-pay

## 2020-02-12 ENCOUNTER — Ambulatory Visit: Payer: Medicare HMO | Admitting: Physical Therapy

## 2020-02-12 ENCOUNTER — Encounter: Payer: Self-pay | Admitting: Physical Therapy

## 2020-02-12 DIAGNOSIS — M6281 Muscle weakness (generalized): Secondary | ICD-10-CM | POA: Diagnosis not present

## 2020-02-12 DIAGNOSIS — M25611 Stiffness of right shoulder, not elsewhere classified: Secondary | ICD-10-CM | POA: Diagnosis not present

## 2020-02-12 DIAGNOSIS — M5412 Radiculopathy, cervical region: Secondary | ICD-10-CM

## 2020-02-12 DIAGNOSIS — R252 Cramp and spasm: Secondary | ICD-10-CM

## 2020-02-12 DIAGNOSIS — G8929 Other chronic pain: Secondary | ICD-10-CM | POA: Diagnosis not present

## 2020-02-12 DIAGNOSIS — M25511 Pain in right shoulder: Secondary | ICD-10-CM | POA: Diagnosis not present

## 2020-02-12 NOTE — Patient Instructions (Signed)
     Voncille Lo, PT, [place] Certified Exercise Expert for the Aging Adult  02/12/20 9:09 AM Phone: [phone] Fax: [phone]

## 2020-02-12 NOTE — Therapy (Signed)
[place], [place], [address] Phone: [phone]   Fax:  [phone]  Physical Therapy Treatment/ERO  Patient Details  Name: ATLANTIS BINI MRN: [id number] Date of Birth: [birth year] Referring Provider (PT): Dr Meredith Pel   Encounter Date: 02/12/2020   PT End of Session - 02/12/20 0837    Visit Number 3    Number of Visits 12    Date for PT Re-Evaluation 02/05/20    Authorization Type MCD.MCR    PT Start Time 0840    PT Stop Time 0935    PT Time Calculation (min) 55 min    Activity Tolerance Patient tolerated treatment well    Behavior During Therapy WFL for tasks assessed/performed           Past Medical History:  Diagnosis Date  . Abnormal facial hair   . Back pain    d/t knee pain  . Headache(784.0)   . Heart murmur    slight  . HTN (hypertension)    takes Amlodipine daily and HCTZ   . Hyperlipidemia    takes Lovastatin nightly  . Joint pain   . Right knee DJD   . Weakness    left hand    Past Surgical History:  Procedure Laterality Date  . ABDOMINAL HYSTERECTOMY    . Athroscopic knee surgery     Bilateral  . CARPAL TUNNEL RELEASE Right 05/02/2016   Procedure: CARPAL TUNNEL RELEASE;  Surgeon: Meredith Pel, MD;  Location: [place];  Service: Orthopedics;  Laterality: Right;  . CHOLECYSTECTOMY    . DILATION AND CURETTAGE OF UTERUS    . EYE SURGERY Right   . SHOULDER ARTHROSCOPY WITH SUBACROMIAL DECOMPRESSION Right 05/02/2016   Procedure: RIGHT SHOULDER ARTHROSCOPY WITH SUBACROMIAL DECOMPRESSION, MINI OPEN ROTATOR CUFF TEAR REPAIR, AND RIGHT CARPAL TUNNEL RELEASE;  Surgeon: Meredith Pel, MD;  Location: [place];  Service: Orthopedics;  Laterality: Right;  . TOTAL KNEE ARTHROPLASTY Right 07/15/2012   Procedure: RIGHT TOTAL KNEE ARTHROPLASTY;  Surgeon: Lorn Junes, MD;  Location: [place];  Service: Orthopedics;  Laterality: Right;    There were no vitals filed for this  visit.   Subjective Assessment - 02/12/20 0845    Subjective I have had flare up of COPD and I have not been able to come. I do my exercises twice a week.  I was able to move my  R arm better after the TPDN. and do my exercises better. I had to cancel because I have been sick    Pertinent History riht TKA, right RTC repair, left hand weakness ( nspecified)    Limitations Writing;House hold activities    How long can you sit comfortably? N/A    How long can you stand comfortably? N/A    How long can you walk comfortably? N/A    Diagnostic tests MRI: multi level dgeneration see MRI    Currently in Pain? Yes    Pain Score 8     Pain Location Shoulder    Pain Orientation Right    Pain Descriptors / Indicators Throbbing;Aching    Pain Type Chronic pain    Pain Onset More than a month ago    Pain Frequency Constant    Pain Score 8    Pain Location Neck    Pain Orientation Right    Pain Descriptors / Indicators Aching              [facility] PT Assessment - 02/12/20  0001      Assessment   Medical Diagnosis Right Cervical Radiculopathy    Referring Provider (PT) Dr Cammy CopaGregory Scott Dean    Hand Dominance Right    Next MD Visit none scheduled    Prior Therapy For lower back and knee      Precautions   Precautions None      Restrictions   Weight Bearing Restrictions No      Home Environment   Additional Comments nothing pertinant to the shoulder      Prior Function   Level of Independence Independent    Vocation Retired      IT consultantCognition   Overall Cognitive Status Within Functional Limits for tasks assessed    Attention Focused    Focused Attention Appears intact    Memory Appears intact    Awareness Appears intact    Problem Solving Appears intact      Observation/Other Assessments   Focus on Therapeutic Outcomes (FOTO)  intake 55%      Sensation   Light Touch Appears Intact    Additional Comments intermittent tingling into the R hand fingers but not thumb intermittently       ROM / Strength   AROM / PROM / Strength AROM;PROM;Strength      AROM   Right Shoulder Flexion 113 Degrees    Right Shoulder ABduction 111 Degrees    Right Shoulder Internal Rotation --   can reach thumb to L-1   Right Shoulder External Rotation --   has to move her head forward to reach neck but can touch C-6   Cervical Flexion 50    Cervical Extension 42   slight pain   Cervical - Right Side Bend 35    Cervical - Left Side Bend 40    Cervical - Right Rotation 55    Cervical - Left Rotation 54      PROM   Overall PROM Comments all PROM with pain at end ranges    Right Shoulder Flexion 158 Degrees    Right Shoulder ABduction 150 Degrees    Right Shoulder Internal Rotation 50 Degrees    Right Shoulder External Rotation 60 Degrees      Strength   Right Shoulder Flexion 4-/5    Right Shoulder Internal Rotation 4/5    Right Shoulder External Rotation 4-/5    Left Shoulder Flexion 4+/5    Left Shoulder Internal Rotation 4+/5    Left Shoulder External Rotation 4+/5      Palpation   Palpation comment spasm and elevated RT upper trap                         [facility] Adult PT Treatment/Exercise - 02/12/20 0001      Neck Exercises: Supine   Other Supine Exercise supine horizontal abdcuton x 10 with red t band      Shoulder Exercises: Standing   External Rotation Both;Theraband;20 reps    External Rotation Limitations VC for good posture and execution      Shoulder Exercises: Stretch   Other Shoulder Stretches doorway pec stretch at 90 degrees 3 x 30 sec hold R    Other Shoulder Stretches standing shld abduction slides at wall and shld scaption wall slide with towel/ pillow case 1 x 10 each      Moist Heat Therapy   Number Minutes Moist Heat 12 Minutes    Moist Heat Location Cervical      Manual Therapy  Manual Therapy Soft tissue mobilization;Joint mobilization    Manual therapy comments skilled palpation for TPDN    Joint Mobilization Lateral UPA C-3 to  C-6 on R grade 2/3 Inf and post glide of shoulder R    Soft tissue mobilization trigger point release to the upper trap and cervical spine      Neck Exercises: Stretches   Upper Trapezius Stretch 3 reps;20 seconds    Levator Stretch 3 reps;20 seconds    Other Neck Stretches deep neck flexion 5 x            Trigger Point Dry Needling - 02/12/20 0001    Consent Given? Yes    Education Handout Provided Previously provided    Muscles Treated Head and Neck Upper trapezius;Levator scapulae;Cervical multifidi   R only   Dry Needling Comments 50 mm ,30 gage    Upper Trapezius Response Twitch reponse elicited;Palpable increased muscle length    Levator Scapulae Response Twitch response elicited;Palpable increased muscle length    Cervical multifidi Response Twitch reponse elicited;Palpable increased muscle length   C-3 to C-6               PT Education - 02/12/20 0909    Education Details ERO for pt having been unable tocome to to illness.  added to HEP and TPDN this session    Person(s) Educated Patient    Methods Explanation;Demonstration;Tactile cues;Verbal cues;Handout    Comprehension Verbalized understanding;Returned demonstration            PT Short Term Goals - 02/12/20 0926      PT SHORT TERM GOAL #1   Title Patient will increase active shoulder flexion to 100 degrees    Baseline today pt with flex 113 and abd 111    Time 3    Period Weeks    Status Achieved    Target Date 02/26/20      PT SHORT TERM GOAL #2   Title Patient will increase right cervical rotation by 10 segrees    Baseline 55 R rot  54 L    Time 3    Period Weeks    Status On-going    Target Date 02/26/20      PT SHORT TERM GOAL #3   Title Patient will report a 50% reduction in right shoulder pain with activity below 90 degrees    Baseline Pt reduced from 8/10 to 4/10 after TPDN today 02-12-20    Time 2    Period Weeks    Status Achieved             PT Long Term Goals - 02/12/20 0930       PT LONG TERM GOAL #1   Title Patient will use her right arm to rach a shelf overhead with <2/10 pain    Baseline ERO 8/10 pain today  reduced to 4/10 after RX    Time 6    Period Weeks    Status New    Target Date 03/25/20      PT LONG TERM GOAL #2   Title Patient will reach behind the back without increased pain in order to perfrom ADL's    Baseline unable to now must turn around and fasten in front    Time 2    Period Weeks    Status On-going    Target Date 03/25/20      PT LONG TERM GOAL #3   Title Pt unable to reach top shelf in kitchen to retrieve cooking  items    Baseline unable to do reaching over head without compensation and upper trap spasm    Time 6    Period Weeks    Status On-going    Target Date 03/25/20      PT LONG TERM GOAL #4   Title increase FOTO score to </= 43% limited to demo improvement in function    Baseline Improved to 55%    Time 6    Period Weeks    Status Achieved    Target Date 03/25/20      PT LONG TERM GOAL #5   Title pt will be I with all HEP given as of last visit to maintain and progress current level of function    Baseline Pt working on advanced HEP    Time 6    Period Weeks    Status New    Target Date 03/25/20                 Plan - 02/12/20 0841    Clinical Impression Statement Pt presents for 3rd visit and many cancellations and a no show due to illness and exacerbation of COPD.  Pt has improved  Shoulder AROM  but decreased neck AROM in rotation.but presents with 8/10 pain in R neck and shoulder but improved with TPDn to 4/10  Pt was able to participate in exercise with greater AROM and decreased pain and will continue to benefit from 1 x a week for the next 6 weeks to maximize strength and function.  Achieved 2 STG and 1 LTG for FOTO goal but will still benefit from completing PT  for maximizing function.    Personal Factors and Comorbidities Comorbidity 1;Comorbidity 2;Comorbidity 3+    Comorbidities right TKA,  Right RTC repair, low back pain    Examination-Activity Limitations Carry;Reach Overhead    Examination-Participation Restrictions Cleaning;Community Activity;Laundry;Shop    PT Frequency 1x / week    PT Duration 6 weeks    PT Treatment/Interventions ADLs/Self Care Home Management;Electrical Stimulation;Cryotherapy;Moist Heat;Ultrasound;Traction;DME Instruction;Stair training;Functional mobility training;Iontophoresis 4mg /ml Dexamethasone;Therapeutic activities;Therapeutic exercise;Balance training;Neuromuscular re-education;Patient/family education;Manual techniques;Passive range of motion;Dry needling;Splinting;Taping    PT Next Visit Plan assess TPDN  shoulder manual  progress exercise    PT Home Exercise Plan upper trap stretch; levator stretch; scpa retraction supine scap [id number]    Consulted and Agree with Plan of Care Patient           Patient will benefit from skilled therapeutic intervention in order to improve the following deficits and impairments:  Decreased range of motion,Impaired UE functional use,Increased muscle spasms,Decreased endurance,Decreased activity tolerance,Pain,Decreased strength,Postural dysfunction  Visit Diagnosis: Radiculopathy, cervical region  Chronic right shoulder pain  Stiffness of right shoulder, not elsewhere classified  Cramp and spasm  Muscle weakness (generalized)  Access Code: [email or web address]: [email or web address]: 01/27/2022Prepared by: Donnetta Simpers BeardsleyExercises  Doorway Pec Stretch at 90 Degrees Abduction - 2-3 x daily - 7 x weekly - 1 sets - 3 reps - 20-30 sec hold  Standing Shoulder Abduction Slides at Wall - 1 x daily - 7 x weekly - 3 sets - 10 reps  Shoulder Scaption Wall Slide with Towel - 1 x daily - 7 x weekly - 3 sets - 10 reps  Supine Deep Neck Flexor Training - 2-3 x daily - 7 x weekly - 10 reps - 3 hold  Seated Gentle Upper Trapezius Stretch - 1 x daily - 7 x weekly - 1 sets - 3 reps - 30 hold  Gentle  Levator Scapulae Stretch -  1 x daily - 7 x weekly - 3 sets - 3 reps - 30 hold  Shoulder External Rotation and Scapular Retraction with Resistance - 2 x daily - 7 x weekly - 3 sets - 10 reps    Problem List Patient Active Problem List   Diagnosis Date Noted  . Class 1 obesity due to excess calories with serious comorbidity and body mass index (BMI) of 31.0 to 31.9 in adult 05/15/2019  . Prediabetes 05/15/2019  . Iron deficiency anemia 05/15/2019  . Systolic ejection murmur 96/22/2979  . Hypoxemia requiring supplemental oxygen 05/15/2019  . Normocytic anemia 04/29/2019  . Chronic obstructive pulmonary disease ([place]) 05/30/2016  . Rotator cuff tear 05/02/2016  . Rotator cuff tendonitis, right 12/30/2015  . Cervicalgia 12/30/2015  . Eczema, dyshidrotic 11/18/2014  . Acne 11/18/2014  . Smoker 06/01/2014  . Right knee DJD   . Hyperlipidemia   . HTN (hypertension) 06/05/2012    Voncille Lo, PT, [place] Certified Exercise Expert for the Aging Adult  02/12/20 10:57 AM Phone: [phone] Fax: [place] [facility] [address] [place], [place], [address] Phone: [phone]   Fax:  [phone]  Name: IVA MONTELONGO MRN: [id number] Date of Birth: November 01, 1955   Check all possible CPT codes: 97110- Therapeutic Exercise, [phone]- Neuro Re-education, [phone] - Gait Training, [phone] - Manual Therapy, 36629 - Therapeutic Activities, [address] - [place], [address] - Electrical stimulation (unattended), [id number] - Iontophoresis and [id number] - Ultrasound

## 2020-02-13 DIAGNOSIS — J449 Chronic obstructive pulmonary disease, unspecified: Secondary | ICD-10-CM | POA: Diagnosis not present

## 2020-02-18 ENCOUNTER — Other Ambulatory Visit: Payer: Self-pay | Admitting: Nurse Practitioner

## 2020-02-18 DIAGNOSIS — J440 Chronic obstructive pulmonary disease with acute lower respiratory infection: Secondary | ICD-10-CM

## 2020-02-18 DIAGNOSIS — J209 Acute bronchitis, unspecified: Secondary | ICD-10-CM

## 2020-02-18 NOTE — Telephone Encounter (Signed)
Requested medication (s) are due for refill today: no  Requested medication (s) are on the active medication list: yes  Last refill:  02/06/2020  Future visit scheduled: yes  Notes to clinic:  this refill cannot be delegated    Requested Prescriptions  Pending Prescriptions Disp Refills   promethazine-dextromethorphan (PROMETHAZINE-DM) 6.25-15 MG/5ML syrup [Pharmacy Med Name: PROMETHAZINE DM     SOL] 120 mL 0    Sig: TAKE 2 & 1/2 (TWO & ONE-HALF) ML BY MOUTH  EVERY 12 HOURS AS NEEDED FOR COUGH      Ear, Nose, and Throat:  Antitussives/Expectorants Passed - 02/18/2020  9:02 AM      Passed - Valid encounter within last 12 months    Recent Outpatient Visits           1 week ago Acute bronchitis with COPD ([place])   [place], Vernia Buff, NP   1 month ago Encounter for [employer] annual wellness exam   [place] Ladell Pier, MD   2 months ago Essential hypertension   [place], MD   7 months ago Chronic obstructive pulmonary disease, unspecified COPD type [facility])   [place] Karle Plumber B, MD   9 months ago Chronic obstructive pulmonary disease, unspecified COPD type [facility])   [place], Deborah B, MD       Future Appointments             In 2 months Wynetta Emery Dalbert Batman, MD [place]               benzonatate (TESSALON) 200 MG capsule [Pharmacy Med Name: Benzonatate 200 MG Oral Capsule] 20 capsule 0    Sig: TAKE 1 CAPSULE BY MOUTH TWICE DAILY AS NEEDED FOR COUGH      Ear, Nose, and Throat:  Antitussives/Expectorants Passed - 02/18/2020  9:02 AM      Passed - Valid encounter within last 12 months    Recent Outpatient Visits           1 week ago Acute bronchitis with COPD [facility])   [place], Vernia Buff, NP   1  month ago Encounter for [employer] annual wellness exam   [place] Ladell Pier, MD   2 months ago Essential hypertension   [place], MD   7 months ago Chronic obstructive pulmonary disease, unspecified COPD type [facility])   [place], MD   9 months ago Chronic obstructive pulmonary disease, unspecified COPD type [facility])   [place], MD       Future Appointments             In 2 months Wynetta Emery, Dalbert Batman, MD [place]

## 2020-03-04 ENCOUNTER — Ambulatory Visit: Payer: Medicare HMO | Attending: Orthopedic Surgery | Admitting: Physical Therapy

## 2020-03-04 ENCOUNTER — Other Ambulatory Visit: Payer: Self-pay

## 2020-03-04 ENCOUNTER — Encounter: Payer: Self-pay | Admitting: Physical Therapy

## 2020-03-04 DIAGNOSIS — M5412 Radiculopathy, cervical region: Secondary | ICD-10-CM | POA: Insufficient documentation

## 2020-03-04 DIAGNOSIS — M6281 Muscle weakness (generalized): Secondary | ICD-10-CM | POA: Diagnosis not present

## 2020-03-04 DIAGNOSIS — M25511 Pain in right shoulder: Secondary | ICD-10-CM | POA: Diagnosis not present

## 2020-03-04 DIAGNOSIS — G8929 Other chronic pain: Secondary | ICD-10-CM | POA: Diagnosis not present

## 2020-03-04 DIAGNOSIS — M25611 Stiffness of right shoulder, not elsewhere classified: Secondary | ICD-10-CM | POA: Diagnosis not present

## 2020-03-04 DIAGNOSIS — R252 Cramp and spasm: Secondary | ICD-10-CM | POA: Insufficient documentation

## 2020-03-04 NOTE — Therapy (Addendum)
[place], [place], [address] Phone: [phone]   Fax:  [phone]  Physical Therapy Treatment/Discharge Note  Patient Details  Name: Alison Ford MRN: [id number] Date of Birth: 05-Sep-1955 Referring Provider (PT): Dr Meredith Pel   Encounter Date: 03/04/2020   PT End of Session - 03/04/20 0943    Visit Number 4    Number of Visits 12    Date for PT Re-Evaluation 02/05/20    Authorization Type MCD.MCR    PT Start Time [id number]    PT Stop Time 1015    PT Time Calculation (min) 33 min    Activity Tolerance Patient tolerated treatment well    Behavior During Therapy WFL for tasks assessed/performed           Past Medical History:  Diagnosis Date  . Abnormal facial hair   . Back pain    d/t knee pain  . Headache(784.0)   . Heart murmur    slight  . HTN (hypertension)    takes Amlodipine daily and HCTZ   . Hyperlipidemia    takes Lovastatin nightly  . Joint pain   . Right knee DJD   . Weakness    left hand    Past Surgical History:  Procedure Laterality Date  . ABDOMINAL HYSTERECTOMY    . Athroscopic knee surgery     Bilateral  . CARPAL TUNNEL RELEASE Right 05/02/2016   Procedure: CARPAL TUNNEL RELEASE;  Surgeon: Meredith Pel, MD;  Location: [facility];  Service: Orthopedics;  Laterality: Right;  . CHOLECYSTECTOMY    . DILATION AND CURETTAGE OF UTERUS    . EYE SURGERY Right   . SHOULDER ARTHROSCOPY WITH SUBACROMIAL DECOMPRESSION Right 05/02/2016   Procedure: RIGHT SHOULDER ARTHROSCOPY WITH SUBACROMIAL DECOMPRESSION, MINI OPEN ROTATOR CUFF TEAR REPAIR, AND RIGHT CARPAL TUNNEL RELEASE;  Surgeon: Meredith Pel, MD;  Location: [facility];  Service: Orthopedics;  Laterality: Right;  . TOTAL KNEE ARTHROPLASTY Right 07/15/2012   Procedure: RIGHT TOTAL KNEE ARTHROPLASTY;  Surgeon: Lorn Junes, MD;  Location: [facility];  Service: Orthopedics;  Laterality: Right;    There were no vitals  filed for this visit.   Subjective Assessment - 03/04/20 1039    Subjective I am feeling better today  I like the TPDN but I used a salon pas patch last night  I am able to reach my bra better than last time    Pertinent History riht TKA, right RTC repair, left hand weakness ( nspecified)    Limitations Writing;House hold activities    Patient Stated Goals to have less pain    Currently in Pain? Yes    Pain Score 6     Pain Location Shoulder    Pain Orientation Right    Pain Descriptors / Indicators Throbbing;Aching    Pain Type Chronic pain    Pain Onset More than a month ago    Pain Frequency Constant    Pain Score 6    Pain Location Neck    Pain Orientation Right    Pain Descriptors / Indicators Aching    Pain Type Chronic pain              [facility] PT Assessment - 03/04/20 0001      AROM   Right Shoulder Flexion 135 Degrees    Right Shoulder ABduction 130 Degrees    Cervical Flexion 52    Cervical Extension 45    Cervical - Right Side Bend  40    Cervical - Left Side Bend 40    Cervical - Right Rotation 58    Cervical - Left Rotation 60                         [facility] Adult PT Treatment/Exercise - 03/04/20 0001      Shoulder Exercises: Standing   External Rotation Both;Theraband;20 reps    Theraband Level (Shoulder External Rotation) Level 2 (Red)    External Rotation Limitations VC for good posture and execution    Flexion Both;10 reps    Theraband Level (Shoulder Flexion) Level 2 (Red)    Flexion Limitations x3 VC and TC to keep elbows straight    ABduction Both;Theraband;10 reps    Shoulder ABduction Weight (lbs) x3 with VC and TC for elbows straight and good posture      Shoulder Exercises: Stretch   Other Shoulder Stretches doorway pec stretch at 90 degrees 3 x 30 sec hold R    Other Shoulder Stretches standing shld abduction slides at wall and shld scaption wall slide with towel/ pillow case 2 x 10 each      Moist Heat Therapy   Number  Minutes Moist Heat 12 Minutes    Moist Heat Location Cervical;Shoulder      Manual Therapy   Manual Therapy Soft tissue mobilization    Manual therapy comments skilled palpation for TPDN    Joint Mobilization Lateral UPA C-3 to C-6 on R grade 2/3 Inf and post glide of shoulder R    Soft tissue mobilization trigger point release to the upper trap and cervical spine    Manual Traction gentle manual traction/ suboccipital release            Trigger Point Dry Needling - 03/04/20 0001    Consent Given? Yes    Education Handout Provided Previously provided    Muscles Treated Head and Neck Upper trapezius;Levator scapulae;Cervical multifidi   R only   Dry Needling Comments 50 mm ,30 gage    Upper Trapezius Response Twitch reponse elicited;Palpable increased muscle length    Levator Scapulae Response Twitch response elicited;Palpable increased muscle length    Cervical multifidi Response Twitch reponse elicited;Palpable increased muscle length                PT Education - 03/04/20 0954    Education Details added to HEP    Person(s) Educated Patient    Methods Explanation;Demonstration;Tactile cues;Verbal cues;Handout    Comprehension Verbalized understanding;Returned demonstration            PT Short Term Goals - 03/04/20 1038      PT SHORT TERM GOAL #1   Title Patient will increase active shoulder flexion to 100 degrees    Baseline today pt with flex 113 and abd 111    Time 3    Period Weeks    Status Achieved    Target Date 02/26/20      PT SHORT TERM GOAL #2   Title Patient will increase right cervical rotation by 10 segrees    Baseline 55 R rot  54 L    Time 3    Period Weeks    Status On-going      PT SHORT TERM GOAL #3   Title Patient will report a 50% reduction in right shoulder pain with activity below 90 degrees    Baseline reduced to 6/10 today    Time 2    Period Weeks  Status Partially Met             PT Long Term Goals - 03/04/20 1040       PT LONG TERM GOAL #1   Title Patient will use her right arm to rach a shelf overhead with <2/10 pain    Baseline pain is 6/10 today but greater AROM R shld flex 135    Time 6    Period Weeks    Status On-going      PT LONG TERM GOAL #2   Title Patient will reach behind the back without increased pain in order to perfrom ADL's    Baseline able to reach in back and bra better    Time 6    Period Weeks    Status On-going      PT LONG TERM GOAL #3   Title Pt unable to reach top shelf in kitchen to retrieve cooking items    Time 6    Period Weeks    Status Unable to assess      PT LONG TERM GOAL #4   Title increase FOTO score to </= 43% limited to demo improvement in function    Time 6    Period Weeks    Status Achieved      PT LONG TERM GOAL #5   Title pt will be I with all HEP given as of last visit to maintain and progress current level of function    Baseline Pt working on advanced HEP    Time 6    Period Weeks    Status On-going           Access Code: [email or web address]: [email or web address]: 02/17/2022Prepared by: Donnetta Simpers BeardsleyExercises   Standing Shoulder Horizontal Abduction with Resistance - 1 x daily - 7 x weekly - 2 sets - 10 reps  Standing Shoulder Flexion with Resistance - 1 x daily - 7 x weekly - 3 sets - 10 reps  Standing Shoulder Abduction with Resistance - 1 x daily - 7 x weekly - 3 sets - 10 reps        Plan - 03/04/20 0944    Clinical Impression Statement Pt c/o of 6/10 pain but shoulder flexion R improved to 135 and abd 130   Neck rotation has improved as weell to R rotation 58 and Left 60  Pt is able to use her arm to reach her bra today and her upper trap has less tissue tension than last visit.  Pt is reporting following though with exercise daily  will contine POC    Personal Factors and Comorbidities Comorbidity 1;Comorbidity 2;Comorbidity 3+    Comorbidities right TKA, Right RTC repair, low back pain    Examination-Activity  Limitations Carry;Reach Overhead    Examination-Participation Restrictions Cleaning;Community Activity;Laundry;Shop    PT Frequency 1x / week    PT Duration 6 weeks    PT Treatment/Interventions ADLs/Self Care Home Management;Electrical Stimulation;Cryotherapy;Moist Heat;Ultrasound;Traction;DME Instruction;Stair training;Functional mobility training;Iontophoresis 75m/ml Dexamethasone;Therapeutic activities;Therapeutic exercise;Balance training;Neuromuscular re-education;Patient/family education;Manual techniques;Passive range of motion;Dry needling;Splinting;Taping    PT Next Visit Plan assess TPDN  shoulder manual  progress exercise    PT Home Exercise Plan upper trap stretch; levator stretch; scpa retraction supine scap [id number]    Consulted and Agree with Plan of Care Patient           Patient will benefit from skilled therapeutic intervention in order to improve the following deficits and impairments:  Decreased range of motion,Impaired UE functional use,Increased muscle spasms,Decreased endurance,Decreased activity tolerance,Pain,Decreased strength,Postural  dysfunction  Visit Diagnosis: Radiculopathy, cervical region  Chronic right shoulder pain  Stiffness of right shoulder, not elsewhere classified  Cramp and spasm  Muscle weakness (generalized)     Problem List Patient Active Problem List   Diagnosis Date Noted  . Class 1 obesity due to excess calories with serious comorbidity and body mass index (BMI) of 31.0 to 31.9 in adult 05/15/2019  . Prediabetes 05/15/2019  . Iron deficiency anemia 05/15/2019  . Systolic ejection murmur 32/76/1470  . Hypoxemia requiring supplemental oxygen 05/15/2019  . Normocytic anemia 04/29/2019  . Chronic obstructive pulmonary disease ([place]) 05/30/2016  . Rotator cuff tear 05/02/2016  . Rotator cuff tendonitis, right 12/30/2015  . Cervicalgia 12/30/2015  . Eczema, dyshidrotic 11/18/2014  . Acne 11/18/2014  . Smoker 06/01/2014   . Right knee DJD   . Hyperlipidemia   . HTN (hypertension) 06/05/2012   Voncille Lo, PT, [place] Certified Exercise Expert for the Aging Adult  03/04/20 10:49 AM Phone: [phone] Fax: [facility] [address] [place], [place], [address] Phone: [phone]   Fax:  [phone]  Name: Alison Ford MRN: [id number] Date of Birth: 17-Oct-1955   PHYSICAL THERAPY DISCHARGE SUMMARY  Visits from Start of Care: 4  Current functional level related to goals / functional outcomes: unknown   Remaining deficits: unknown   Education / Equipment: Initial HEP Plan:                                                    Patient goals were partially met. Patient is being discharged due to not returning since the last visit.  ?????    Voncille Lo, PT, [facility] Certified Exercise Expert for the Aging Adult  05/25/20 3:18 PM Phone: [phone] Fax: [phone]

## 2020-03-04 NOTE — Patient Instructions (Signed)
   Voncille Lo, PT, [place] Certified Exercise Expert for the Aging Adult  03/04/20 9:54 AM Phone: [phone] Fax: [phone]

## 2020-03-11 ENCOUNTER — Ambulatory Visit: Payer: Medicare HMO | Admitting: Physical Therapy

## 2020-03-13 ENCOUNTER — Other Ambulatory Visit: Payer: Self-pay | Admitting: Internal Medicine

## 2020-03-13 NOTE — Telephone Encounter (Signed)
Requested medication (s) are due for refill today: yes  Requested medication (s) are on the active medication list: yes  Last refill:  07/15/19  Future visit scheduled: yes  Notes to clinic:  Medication not assigned to a protocol   Requested Prescriptions  Pending Prescriptions Disp Refills   clotrimazole-betamethasone (LOTRISONE) cream [Pharmacy Med Name: Clotrimazole-Betamethasone 1-0.05 % External Cream] 30 g 0    Sig: APPLY CREAM TOPICALLY TWICE DAILY.      Off-Protocol Failed - 03/13/2020  8:52 AM      Failed - Medication not assigned to a protocol, review manually.      Passed - Valid encounter within last 12 months    Recent Outpatient Visits           1 month ago Acute bronchitis with COPD [facility])   [place], Vernia Buff, NP   2 months ago Encounter for [employer] annual wellness exam   [place] Ladell Pier, MD   3 months ago Essential hypertension   [place], Deborah B, MD   8 months ago Chronic obstructive pulmonary disease, unspecified COPD type [facility])   [place], MD   10 months ago Chronic obstructive pulmonary disease, unspecified COPD type [facility])   [place], MD       Future Appointments             In 1 month Wynetta Emery, Dalbert Batman, MD [place]

## 2020-03-15 DIAGNOSIS — J449 Chronic obstructive pulmonary disease, unspecified: Secondary | ICD-10-CM | POA: Diagnosis not present

## 2020-03-18 ENCOUNTER — Ambulatory Visit: Payer: Medicare HMO | Admitting: Physical Therapy

## 2020-04-07 DIAGNOSIS — H40013 Open angle with borderline findings, low risk, bilateral: Secondary | ICD-10-CM | POA: Diagnosis not present

## 2020-04-07 DIAGNOSIS — H10413 Chronic giant papillary conjunctivitis, bilateral: Secondary | ICD-10-CM | POA: Diagnosis not present

## 2020-04-07 DIAGNOSIS — H2513 Age-related nuclear cataract, bilateral: Secondary | ICD-10-CM | POA: Diagnosis not present

## 2020-04-07 DIAGNOSIS — R7309 Other abnormal glucose: Secondary | ICD-10-CM | POA: Diagnosis not present

## 2020-04-07 DIAGNOSIS — H04123 Dry eye syndrome of bilateral lacrimal glands: Secondary | ICD-10-CM | POA: Diagnosis not present

## 2020-04-07 LAB — HM DIABETES EYE EXAM

## 2020-04-12 DIAGNOSIS — J449 Chronic obstructive pulmonary disease, unspecified: Secondary | ICD-10-CM | POA: Diagnosis not present

## 2020-04-19 ENCOUNTER — Other Ambulatory Visit: Payer: Self-pay | Admitting: Internal Medicine

## 2020-04-19 DIAGNOSIS — I10 Essential (primary) hypertension: Secondary | ICD-10-CM

## 2020-04-27 ENCOUNTER — Ambulatory Visit: Payer: Medicare HMO | Admitting: Internal Medicine

## 2020-04-27 ENCOUNTER — Other Ambulatory Visit: Payer: Self-pay

## 2020-04-27 ENCOUNTER — Ambulatory Visit: Payer: Medicare HMO | Attending: Internal Medicine | Admitting: Internal Medicine

## 2020-04-27 DIAGNOSIS — Z91199 Patient's noncompliance with other medical treatment and regimen due to unspecified reason: Secondary | ICD-10-CM

## 2020-04-27 DIAGNOSIS — Z5329 Procedure and treatment not carried out because of patient's decision for other reasons: Secondary | ICD-10-CM

## 2020-04-27 NOTE — Progress Notes (Signed)
Patient was scheduled for telephone visit today.  I tried to reach her at 9:50 AM and again at 11:50 AM.  No answer.  I left messages on voicemail both times informing that I was calling to do her telephone visit.  I will submit her name to the front desk to have her rescheduled.

## 2020-05-12 ENCOUNTER — Other Ambulatory Visit: Payer: Self-pay | Admitting: Internal Medicine

## 2020-05-12 DIAGNOSIS — I10 Essential (primary) hypertension: Secondary | ICD-10-CM

## 2020-05-12 NOTE — Telephone Encounter (Signed)
Requested Prescriptions  Pending Prescriptions Disp Refills  . lisinopril-hydrochlorothiazide (ZESTORETIC) 20-25 MG tablet [Pharmacy Med Name: LISINOPRIL/HYDROCHLOROTHIAZIDE 20-25 MG Tablet] 90 tablet 0    Sig: TAKE 1 TABLET EVERY DAY     Cardiovascular:  ACEI + Diuretic Combos Passed - 05/12/2020  5:42 PM      Passed - Na in normal range and within 180 days    Sodium  Date Value Ref Range Status  11/26/2019 140 134 - 144 mmol/L Final         Passed - K in normal range and within 180 days    Potassium  Date Value Ref Range Status  11/26/2019 4.6 3.5 - 5.2 mmol/L Final         Passed - Cr in normal range and within 180 days    Creat  Date Value Ref Range Status  09/29/2015 0.85 0.50 - 0.99 mg/dL Final    Comment:      For patients > or = 65 years of age: The upper reference limit for Creatinine is approximately 13% higher for people identified as African-American.      Creatinine, Ser  Date Value Ref Range Status  11/26/2019 0.86 0.57 - 1.00 mg/dL Final         Passed - Ca in normal range and within 180 days    Calcium  Date Value Ref Range Status  11/26/2019 10.2 8.7 - 10.3 mg/dL Final   Calcium, Ion  Date Value Ref Range Status  07/28/2011 1.27 (H) 1.12 - 1.23 mmol/L Final         Passed - Patient is not pregnant      Passed - Last BP in normal range    BP Readings from Last 1 Encounters:  12/26/19 123/80         Passed - Valid encounter within last 6 months    Recent Outpatient Visits          2 weeks ago No-show for appointment   [place] Ladell Pier, MD   3 months ago Acute bronchitis with COPD [facility])   [place], Vernia Buff, NP   4 months ago Encounter for [employer] annual wellness exam   [place] Ladell Pier, MD   5 months ago Essential hypertension   [place], MD   10 months ago  Chronic obstructive pulmonary disease, unspecified COPD type [facility])   [place], MD      Future Appointments            In 2 months Wynetta Emery, Dalbert Batman, MD [place]

## 2020-05-13 ENCOUNTER — Other Ambulatory Visit: Payer: Self-pay | Admitting: Surgical

## 2020-05-13 ENCOUNTER — Other Ambulatory Visit: Payer: Self-pay | Admitting: Internal Medicine

## 2020-05-13 DIAGNOSIS — J449 Chronic obstructive pulmonary disease, unspecified: Secondary | ICD-10-CM | POA: Diagnosis not present

## 2020-05-13 NOTE — Telephone Encounter (Signed)
Requested medication (s) are due for refill today:  yes  Requested medication (s) are on the active medication list: yes  Last refill: 03/15/2020  Future visit scheduled: yes  Notes to clinic:  Medication not assigned to a protocol, review manually   Requested Prescriptions  Pending Prescriptions Disp Refills   clotrimazole-betamethasone (LOTRISONE) cream [Pharmacy Med Name: Clotrimazole-Betamethasone 1-0.05 % External Cream] 30 g 0    Sig: APPLY CREAM TOPICALLY TWICE A DAY      Off-Protocol Failed - 05/13/2020  1:38 PM      Failed - Medication not assigned to a protocol, review manually.      Passed - Valid encounter within last 12 months    Recent Outpatient Visits           2 weeks ago No-show for appointment   [place] Ladell Pier, MD   3 months ago Acute bronchitis with COPD [facility])   [place], Vernia Buff, NP   4 months ago Encounter for [employer] annual wellness exam   [place] Ladell Pier, MD   5 months ago Essential hypertension   [place], MD   10 months ago Chronic obstructive pulmonary disease, unspecified COPD type [facility])   [place], MD       Future Appointments             In 2 months Wynetta Emery Dalbert Batman, MD [place]

## 2020-05-13 NOTE — Telephone Encounter (Signed)
Pls advise.  

## 2020-05-13 NOTE — Telephone Encounter (Signed)
Don't want to prescribe opioid medication for someone who hasnt been seen in several months

## 2020-05-14 ENCOUNTER — Other Ambulatory Visit: Payer: Self-pay | Admitting: Surgical

## 2020-05-14 ENCOUNTER — Telehealth: Payer: Self-pay

## 2020-05-14 NOTE — Telephone Encounter (Signed)
Patient called she is requesting rx refill for tylenol 3 call [phone]

## 2020-05-17 NOTE — Telephone Encounter (Signed)
Needs another office visit prior to opioid RX

## 2020-05-17 NOTE — Telephone Encounter (Signed)
Pls advise.  

## 2020-05-19 ENCOUNTER — Other Ambulatory Visit: Payer: Self-pay | Admitting: Surgical

## 2020-06-03 ENCOUNTER — Other Ambulatory Visit: Payer: Self-pay | Admitting: Internal Medicine

## 2020-06-03 DIAGNOSIS — E785 Hyperlipidemia, unspecified: Secondary | ICD-10-CM

## 2020-06-03 NOTE — Telephone Encounter (Signed)
Requested Prescriptions  Pending Prescriptions Disp Refills  . lovastatin (MEVACOR) 40 MG tablet [Pharmacy Med Name: LOVASTATIN 40 MG Tablet] 90 tablet 2    Sig: TAKE 1 TABLET AT BEDTIME     Cardiovascular:  Antilipid - Statins Failed - 06/03/2020  3:43 PM      Failed - Total Cholesterol in normal range and within 360 days    Cholesterol, Total  Date Value Ref Range Status  11/26/2019 219 (H) 100 - 199 mg/dL Final         Failed - LDL in normal range and within 360 days    LDL Chol Calc (NIH)  Date Value Ref Range Status  11/26/2019 140 (H) 0 - 99 mg/dL Final         Passed - HDL in normal range and within 360 days    HDL  Date Value Ref Range Status  11/26/2019 59 >39 mg/dL Final         Passed - Triglycerides in normal range and within 360 days    Triglycerides  Date Value Ref Range Status  11/26/2019 113 0 - 149 mg/dL Final         Passed - Patient is not pregnant      Passed - Valid encounter within last 12 months    Recent Outpatient Visits          1 month ago No-show for appointment   [place] Ladell Pier, MD   3 months ago Acute bronchitis with COPD [facility])   [place], Vernia Buff, NP   5 months ago Encounter for [employer] annual wellness exam   [place], Deborah B, MD   6 months ago Essential hypertension   [place], Deborah B, MD   10 months ago Chronic obstructive pulmonary disease, unspecified COPD type [facility])   [place], Deborah B, MD      Future Appointments            In 1 month Wynetta Emery, Dalbert Batman, MD [place]

## 2020-06-05 ENCOUNTER — Other Ambulatory Visit: Payer: Self-pay | Admitting: Internal Medicine

## 2020-06-05 DIAGNOSIS — R7303 Prediabetes: Secondary | ICD-10-CM

## 2020-06-05 NOTE — Telephone Encounter (Signed)
Requested medication (s) are due for refill today: yes  Requested medication (s) are on the active medication list: yes  Last refill:  05/15/19  Future visit scheduled: yes  Notes to clinic:  overdue lab   Requested Prescriptions  Pending Prescriptions Disp Refills   metFORMIN (GLUCOPHAGE) 500 MG tablet [Pharmacy Med Name: metFORMIN HCl 500 MG Oral Tablet] 30 tablet 0    Sig: TAKE 1/2 (ONE-HALF) TABLET BY MOUTH ONCE DAILY WITH BREAKFAST      Endocrinology:  Diabetes - Biguanides Failed - 06/05/2020 11:59 AM      Failed - HBA1C is between 0 and 7.9 and within 180 days    Hgb A1c MFr Bld  Date Value Ref Range Status  11/26/2019 5.6 4.8 - 5.6 % Final    Comment:             Prediabetes: 5.7 - 6.4          Diabetes: >6.4          Glycemic control for adults with diabetes: <7.0           Passed - Cr in normal range and within 360 days    Creat  Date Value Ref Range Status  09/29/2015 0.85 0.50 - 0.99 mg/dL Final    Comment:      For patients > or = 65 years of age: The upper reference limit for Creatinine is approximately 13% higher for people identified as African-American.      Creatinine, Ser  Date Value Ref Range Status  11/26/2019 0.86 0.57 - 1.00 mg/dL Final          Passed - AA eGFR in normal range and within 360 days    GFR, Est African American  Date Value Ref Range Status  09/29/2015 86 >=60 mL/min Final   GFR calc Af Amer  Date Value Ref Range Status  11/26/2019 83 >59 mL/min/1.73 Final    Comment:    **In accordance with recommendations from the NKF-ASN Task force,**   [facility] is in the process of updating its eGFR calculation to the   2021 CKD-EPI creatinine equation that estimates kidney function   without a race variable.    GFR, Est Non African American  Date Value Ref Range Status  09/29/2015 75 >=60 mL/min Final   GFR calc non Af Amer  Date Value Ref Range Status  11/26/2019 72 >59 mL/min/1.73 Final          Passed - Valid encounter  within last 6 months    Recent Outpatient Visits           1 month ago No-show for appointment   [place] Ladell Pier, MD   4 months ago Acute bronchitis with COPD [facility])   [place], Vernia Buff, NP   5 months ago Encounter for [employer] annual wellness exam   [place] Ladell Pier, MD   6 months ago Essential hypertension   [place], MD   10 months ago Chronic obstructive pulmonary disease, unspecified COPD type [facility])   [place], MD       Future Appointments             In 1 month Wynetta Emery Dalbert Batman, MD [place]

## 2020-06-09 ENCOUNTER — Ambulatory Visit: Payer: Medicare HMO | Admitting: Physician Assistant

## 2020-06-12 DIAGNOSIS — J449 Chronic obstructive pulmonary disease, unspecified: Secondary | ICD-10-CM | POA: Diagnosis not present

## 2020-07-01 ENCOUNTER — Other Ambulatory Visit: Payer: Self-pay | Admitting: Internal Medicine

## 2020-07-01 DIAGNOSIS — I10 Essential (primary) hypertension: Secondary | ICD-10-CM

## 2020-07-12 ENCOUNTER — Other Ambulatory Visit: Payer: Self-pay

## 2020-07-12 ENCOUNTER — Ambulatory Visit: Payer: Medicare HMO | Attending: Internal Medicine | Admitting: Internal Medicine

## 2020-07-12 ENCOUNTER — Encounter: Payer: Self-pay | Admitting: Internal Medicine

## 2020-07-12 DIAGNOSIS — I10 Essential (primary) hypertension: Secondary | ICD-10-CM

## 2020-07-12 DIAGNOSIS — R7303 Prediabetes: Secondary | ICD-10-CM | POA: Diagnosis not present

## 2020-07-12 DIAGNOSIS — B353 Tinea pedis: Secondary | ICD-10-CM | POA: Diagnosis not present

## 2020-07-12 DIAGNOSIS — Z9981 Dependence on supplemental oxygen: Secondary | ICD-10-CM | POA: Diagnosis not present

## 2020-07-12 DIAGNOSIS — J449 Chronic obstructive pulmonary disease, unspecified: Secondary | ICD-10-CM

## 2020-07-12 DIAGNOSIS — Z23 Encounter for immunization: Secondary | ICD-10-CM

## 2020-07-12 DIAGNOSIS — D508 Other iron deficiency anemias: Secondary | ICD-10-CM | POA: Diagnosis not present

## 2020-07-12 DIAGNOSIS — F17210 Nicotine dependence, cigarettes, uncomplicated: Secondary | ICD-10-CM | POA: Diagnosis not present

## 2020-07-12 DIAGNOSIS — J9611 Chronic respiratory failure with hypoxia: Secondary | ICD-10-CM | POA: Diagnosis not present

## 2020-07-12 DIAGNOSIS — F17200 Nicotine dependence, unspecified, uncomplicated: Secondary | ICD-10-CM

## 2020-07-12 MED ORDER — AMLODIPINE BESYLATE 10 MG PO TABS: 10.0000 mg | ORAL_TABLET | Freq: Every day | ORAL | 2 refills | Status: DC

## 2020-07-12 MED ORDER — CLOTRIMAZOLE-BETAMETHASONE 1-0.05 % EX CREA: TOPICAL_CREAM | CUTANEOUS | 0 refills | Status: DC

## 2020-07-12 MED ORDER — LISINOPRIL-HYDROCHLOROTHIAZIDE 20-25 MG PO TABS
1.0000 | ORAL_TABLET | Freq: Every day | ORAL | 2 refills | Status: DC
Start: 2020-07-12 — End: 2021-06-24

## 2020-07-12 MED ORDER — METFORMIN HCL 500 MG PO TABS: ORAL_TABLET | ORAL | 2 refills | Status: DC

## 2020-07-12 NOTE — Progress Notes (Signed)
Patient ID: [name], female   DOB: [birth year], 65 y.o.   MRN: [id number] Virtual Visit via Telephone Note  I connected with Alison Ford on 07/12/2020 at 1:39 p.m by telephone and verified that I am speaking with the correct person using two identifiers  Location: Patient: home Provider: office  Participants: Myself Patient CMA: Alison Ford interpreter:   I discussed the limitations, risks, security and privacy concerns of performing an evaluation and management service by telephone and the availability of in person appointments. I also discussed with the patient that there may be a patient responsible charge related to this service. The patient expressed understanding and agreed to proceed.   History of Present Illness: Pt with hx of  HTN, preDM, HL, tob dep, COPD stage 1 (Alison Ford), on home O2, IDA, Vit D def and s/p RT rotator cuff repair and RT CTS release, LT OA knee. Last saw me 12/2019.  This was suppose to be an in-person visit but was changed to telephone due to shortage on [place] staff.  COPD/O 2 use: using oxygen only when she anticipate long walking Due for re-certification for her O2. No recent flares of COPD.  No cough at this time.  She has not had to use Albuterol inhaler for past 1 mth.  Uses neb machine about once a mth. Uses Trelegy once a day.  -Trying to quit smoking.  Would quit for several days to a wk then goes back to smoking.  Wants to get into a smoking cessation class if [facility] offers one.  Currently chews nicotine gum which helps decrease her cravings.  Does not tolerate the nicotine patches.  HTN:  compliant with Norvasc and Lis/HCTZ.  Limits salt in foods Checks BP 2 x a wk.  Gives range 120s/60s No CP/LE edema/palpitations/HA  HL:  taking and tolerating Lovastatin  Anemia: takes Iron supplement 3-4 times a wk  PreDM: taking Metformin daily.  Does well with eating habits.  Tries to get out and walk  Needs RF on Clotrimazole for fungal  rash on feet.  HM:  due for Pneumonia vaccine.  Reports having received 2nd shingles and COVID booster shot at [facility] [place] Encounter Medications as of 07/12/2020  Medication Sig   acetaminophen-codeine (TYLENOL #3) 300-30 MG tablet TAKE 1 TABLET BY MOUTH AT BEDTIME AS NEEDED FOR MODERATE PAIN (Patient not taking: Reported on 02/06/2020)   albuterol (PROVENTIL) (2.5 MG/3ML) 0.083% nebulizer solution Take 3 mLs (2.5 mg total) by nebulization every 6 (six) hours as needed for wheezing or shortness of breath.   albuterol (VENTOLIN HFA) 108 (90 Base) MCG/ACT inhaler 2 puffs QID as needed   amLODipine (NORVASC) 10 MG tablet TAKE 1 TABLET EVERY DAY   aspirin EC 81 MG tablet Take 1 tablet (81 mg total) by mouth daily.   benzonatate (TESSALON) 200 MG capsule TAKE 1 CAPSULE BY MOUTH TWICE DAILY AS NEEDED FOR COUGH   Blood Glucose Monitoring Suppl (TRUE METRIX METER) w/Device KIT Use as directed   cetirizine (ZYRTEC) 10 MG tablet Take 1 tablet (10 mg total) by mouth daily.   clotrimazole-betamethasone (LOTRISONE) cream APPLY CREAM TOPICALLY TWICE A DAY   cromolyn (OPTICROM) 4 % ophthalmic solution Place 1 drop into both eyes 4 (four) times daily.   ferrous sulfate 325 (65 FE) MG tablet Take 1 tablet (325 mg total) by mouth daily with breakfast.   glucose blood (TRUE METRIX BLOOD GLUCOSE TEST) test strip Use to check blood sugar once daily.  LINZESS 145 MCG CAPS capsule Take 1 capsule (145 mcg total) by mouth daily before breakfast.   lisinopril-hydrochlorothiazide (ZESTORETIC) 20-25 MG tablet TAKE 1 TABLET EVERY DAY   lovastatin (MEVACOR) 40 MG tablet TAKE 1 TABLET AT BEDTIME   metFORMIN (GLUCOPHAGE) 500 MG tablet TAKE 1/2 (ONE-HALF) TABLET BY MOUTH ONCE DAILY WITH BREAKFAST   pantoprazole (PROTONIX) 40 MG tablet Take 1 tablet (40 mg total) by mouth daily.   promethazine-dextromethorphan (PROMETHAZINE-DM) 6.25-15 MG/5ML syrup TAKE 2 & 1/2 (TWO & ONE-HALF) ML BY MOUTH  EVERY 12 HOURS AS  NEEDED FOR COUGH   TRELEGY ELLIPTA 100-62.5-25 MCG/INH AEPB INHALE 1 PUFF INTO THE LUNGS EVERY MORNING.   TRUEplus Lancets 28G MISC Use as directed   No facility-administered encounter medications on file as of 07/12/2020.      Observations/Objective: Results for orders placed or performed in visit on 04/07/20  HM DIABETES EYE EXAM  Result Value Ref Range   HM Diabetic Eye Exam No Retinopathy No Retinopathy     Assessment and Plan: 1. Stage 1 mild COPD by GOLD classification (HCC) Stable on Trelegy inhaler. Smoking cessation advised.  2. Chronic respiratory failure with hypoxia, on home oxygen therapy (HCC) We will have her come in for my CMA to do pulse ox check at rest and ambulation with and without portable oxygen  3. Essential hypertension Continue current medications and low-salt diet. - amLODipine (NORVASC) 10 MG tablet; Take 1 tablet (10 mg total) by mouth daily.  Dispense: 90 tablet; Refill: 2 - lisinopril-hydrochlorothiazide (ZESTORETIC) 20-25 MG tablet; Take 1 tablet by mouth daily.  Dispense: 90 tablet; Refill: 2  4. Tobacco dependence Pt is current smoker. Patient advised to quit smoking. Discussed health risks associated with smoking including lung and other types of cancers, chronic lung diseases and CV risks.. Pt ready and wanting to quit.   Discussed methods to help quit including quitting cold [place], use of NRT, Chantix and Bupropion. She prefers to stick with the nicotine gum.  I will have our caseworker check into whether [place] office smoking cessation classes and will let her know.  Encouraged her to set a quit date. Pt wanting to try: _4_ Minutes spent on counseling. F/U: Reassess progress at her next visit.   5. Other iron deficiency anemia - CBC  6. Prediabetes Encouraged her to continue healthy eating habits and move as much as she can - metFORMIN (GLUCOPHAGE) 500 MG tablet; TAKE 1/2 (ONE-HALF) TABLET BY MOUTH ONCE DAILY WITH BREAKFAST   Dispense: 45 tablet; Refill: 2  7. Tinea pedis of both feet - clotrimazole-betamethasone (LOTRISONE) cream; APPLY CREAM TOPICALLY TWICE A DAY  Dispense: 30 g; Refill: 0  8.  Need for pneumonia vaccine Will give appt with clinical pharmacist  Follow Up Instructions: 4 mths   I discussed the assessment and treatment plan with the patient. The patient was provided an opportunity to ask questions and all were answered. The patient agreed with the plan and demonstrated an understanding of the instructions.   The patient was advised to call back or seek an in-person evaluation if the symptoms worsen or if the condition fails to improve as anticipated.  I  Spent 20 minutes on this telephone encounter  Karle Plumber, MD

## 2020-07-13 ENCOUNTER — Telehealth: Payer: Self-pay

## 2020-07-13 DIAGNOSIS — J449 Chronic obstructive pulmonary disease, unspecified: Secondary | ICD-10-CM | POA: Diagnosis not present

## 2020-07-13 NOTE — Telephone Encounter (Signed)
Following up on a request from the patient's PCP to share resources concerning smoking cessation. LVM for the patient to call the office back and speak with [facility].

## 2020-07-14 ENCOUNTER — Telehealth: Payer: Self-pay

## 2020-07-14 NOTE — Telephone Encounter (Signed)
Returned a phone call to the patient to assist with getting them connected with [facility]. Verified the patients information. Case Manager spoke with the patient on registering online, patient shared they find technology to be difficult and would like some assistance as well. Case Manager shared details with patient regarding the class and registered them as well. Patient expressed appreciation for the upcoming class.

## 2020-07-22 ENCOUNTER — Telehealth: Payer: Self-pay | Admitting: Internal Medicine

## 2020-07-22 NOTE — Telephone Encounter (Signed)
Copied from [place] [id number]. Topic: General - Other >> Jul 21, 2020  2:53 PM Leward Quan A wrote: Reason for CRM: Patient called in asking Dr Wynetta Emery to give her a call states that it has to do with the smoking cessation classes can be reached at Ph# [phone]

## 2020-07-23 NOTE — Telephone Encounter (Signed)
Pt is requesting a call back in regards to the smoking cessation classes

## 2020-07-26 ENCOUNTER — Telehealth: Payer: Self-pay

## 2020-07-26 NOTE — Telephone Encounter (Signed)
Returning a phone call to the patient regarding Smoking Cessation classes. LVM for the patient to contact [facility] [place].

## 2020-07-27 ENCOUNTER — Telehealth: Payer: Self-pay

## 2020-07-27 NOTE — Telephone Encounter (Signed)
Patient had her 1st smoking cession class on 7/11. Calling to check in. LVM for the patient to reach out to the clinic.

## 2020-07-29 ENCOUNTER — Telehealth: Payer: Self-pay | Admitting: Internal Medicine

## 2020-07-29 MED ORDER — NICOTINE POLACRILEX 2 MG MT GUM: 2.0000 mg | CHEWING_GUM | OROMUCOSAL | 3 refills | Status: DC | PRN

## 2020-07-29 NOTE — Telephone Encounter (Signed)
Pt is calling to see if Dr. Wynetta Emery would write her a script for nicotine patch or gum. Pt states she has previously discuss this with Dr. Wynetta Emery. [place]. [place], [place]

## 2020-07-29 NOTE — Telephone Encounter (Signed)
Will forward to provider  

## 2020-07-30 DIAGNOSIS — K649 Unspecified hemorrhoids: Secondary | ICD-10-CM | POA: Diagnosis not present

## 2020-07-30 DIAGNOSIS — K589 Irritable bowel syndrome without diarrhea: Secondary | ICD-10-CM | POA: Diagnosis not present

## 2020-07-30 DIAGNOSIS — K5900 Constipation, unspecified: Secondary | ICD-10-CM | POA: Diagnosis not present

## 2020-07-30 DIAGNOSIS — D509 Iron deficiency anemia, unspecified: Secondary | ICD-10-CM | POA: Diagnosis not present

## 2020-07-30 DIAGNOSIS — K2970 Gastritis, unspecified, without bleeding: Secondary | ICD-10-CM | POA: Diagnosis not present

## 2020-07-30 DIAGNOSIS — Z86010 Personal history of colonic polyps: Secondary | ICD-10-CM | POA: Diagnosis not present

## 2020-07-30 NOTE — Telephone Encounter (Signed)
Contacted pt to go over provider response pt is aware and doesn't have any questions or concern

## 2020-08-11 ENCOUNTER — Encounter: Payer: Self-pay | Admitting: Internal Medicine

## 2020-08-11 NOTE — Progress Notes (Signed)
Patient seen by Deliah Goody, PA at [facility].  Patient seen 07/30/2020.  Assessment was gastritis determined by endoscopy, chronic constipation, IBS, history of colon polyps, iron deficiency anemia and hemorrhoids. Patient to continue pantoprazole 40 mg daily and Pepcid 20 mg at bedtime Continue Linzess 290 mcg daily. Stop Hyoscyamine ER 12 hr BID.  Change to Hyoscyamine 0.125 mg Q 4 hrs PRN Ferritin level 13.7, iron level 62, iron saturation 14. Hemoglobin 12.5, hematocrit 38, RDW 17.

## 2020-08-12 DIAGNOSIS — J449 Chronic obstructive pulmonary disease, unspecified: Secondary | ICD-10-CM | POA: Diagnosis not present

## 2020-08-17 ENCOUNTER — Encounter: Payer: Self-pay | Admitting: Pulmonary Disease

## 2020-08-17 ENCOUNTER — Ambulatory Visit (INDEPENDENT_AMBULATORY_CARE_PROVIDER_SITE_OTHER): Payer: Medicare HMO | Admitting: Pulmonary Disease

## 2020-08-17 ENCOUNTER — Other Ambulatory Visit: Payer: Self-pay

## 2020-08-17 VITALS — BP 130/80 | HR 79 | Temp 97.3°F | Ht 67.0 in | Wt 192.2 lb

## 2020-08-17 DIAGNOSIS — R0602 Shortness of breath: Secondary | ICD-10-CM

## 2020-08-17 DIAGNOSIS — J449 Chronic obstructive pulmonary disease, unspecified: Secondary | ICD-10-CM

## 2020-08-17 MED ORDER — PREDNISONE 20 MG PO TABS: 20.0000 mg | ORAL_TABLET | Freq: Every day | ORAL | 0 refills | Status: DC

## 2020-08-17 MED ORDER — DOXYCYCLINE HYCLATE 100 MG PO TABS: 100.0000 mg | ORAL_TABLET | Freq: Two times a day (BID) | ORAL | 0 refills | Status: DC

## 2020-08-17 NOTE — Progress Notes (Signed)
Alison Ford    [id number]    08-Feb-1955  Primary Care Physician:Johnson, Dalbert Batman, MD  Referring Physician: Ladell Pier, MD [address] [place],   [address]  Chief complaint:  Shortness of breath with activity  HPI:   History of COPD  Slight improvement in symptoms with use of Trelegy Trying not to smoke  She does have a cough, bringing up clear to yellow phlegm No chest pain or chest discomfort Shortness of breath with exertion  Weight has fluctuated She has a cough bringing up some secretions-this is not any worse Does not feel acutely ill Has not had any febrile illness  Denies any chest pains or chest discomfort  At some point was smoking up to a pack of cigarettes a day  She worked in [employer] research (medical), housekeeping in the past  No family history of lung disease   Outpatient Encounter Medications as of 08/17/2020  Medication Sig   albuterol (PROVENTIL) (2.5 MG/3ML) 0.083% nebulizer solution Take 3 mLs (2.5 mg total) by nebulization every 6 (six) hours as needed for wheezing or shortness of breath.   albuterol (VENTOLIN HFA) 108 (90 Base) MCG/ACT inhaler 2 puffs QID as needed   amLODipine (NORVASC) 10 MG tablet Take 1 tablet (10 mg total) by mouth daily.   aspirin EC 81 MG tablet Take 1 tablet (81 mg total) by mouth daily.   benzonatate (TESSALON) 200 MG capsule TAKE 1 CAPSULE BY MOUTH TWICE DAILY AS NEEDED FOR COUGH   Blood Glucose Monitoring Suppl (TRUE METRIX METER) w/Device KIT Use as directed   cetirizine (ZYRTEC) 10 MG tablet Take 1 tablet (10 mg total) by mouth daily.   clotrimazole-betamethasone (LOTRISONE) cream APPLY CREAM TOPICALLY TWICE A DAY   cromolyn (OPTICROM) 4 % ophthalmic solution Place 1 drop into both eyes 4 (four) times daily.   ferrous sulfate 325 (65 FE) MG tablet Take 1 tablet (325 mg total) by mouth daily with breakfast.   glucose blood (TRUE METRIX BLOOD GLUCOSE TEST) test strip Use to check blood sugar once daily.    LINZESS 145 MCG CAPS capsule Take 1 capsule (145 mcg total) by mouth daily before breakfast.   lisinopril-hydrochlorothiazide (ZESTORETIC) 20-25 MG tablet Take 1 tablet by mouth daily.   lovastatin (MEVACOR) 40 MG tablet TAKE 1 TABLET AT BEDTIME   metFORMIN (GLUCOPHAGE) 500 MG tablet TAKE 1/2 (ONE-HALF) TABLET BY MOUTH ONCE DAILY WITH BREAKFAST   nicotine polacrilex (NICORETTE) 2 MG gum Take 1 each (2 mg total) by mouth as needed for smoking cessation.   promethazine-dextromethorphan (PROMETHAZINE-DM) 6.25-15 MG/5ML syrup TAKE 2 & 1/2 (TWO & ONE-HALF) ML BY MOUTH  EVERY 12 HOURS AS NEEDED FOR COUGH   TRELEGY ELLIPTA 100-62.5-25 MCG/INH AEPB INHALE 1 PUFF INTO THE LUNGS EVERY MORNING.   TRUEplus Lancets 28G MISC Use as directed   pantoprazole (PROTONIX) 40 MG tablet Take 1 tablet (40 mg total) by mouth daily.   No facility-administered encounter medications on file as of 08/17/2020.    Allergies as of 08/17/2020 - Review Complete 08/17/2020  Allergen Reaction Noted   Penicillins Itching and Other (See Comments) 07/28/2011    Past Medical History:  Diagnosis Date   Abnormal facial hair    Back pain    d/t knee pain   Headache(784.0)    Heart murmur    slight   HTN (hypertension)    takes Amlodipine daily and HCTZ    Hyperlipidemia    takes Lovastatin nightly  Joint pain    Right knee DJD    Weakness    left hand    Past Surgical History:  Procedure Laterality Date   ABDOMINAL HYSTERECTOMY     Athroscopic knee surgery     Bilateral   CARPAL TUNNEL RELEASE Right 05/02/2016   Procedure: CARPAL TUNNEL RELEASE;  Surgeon: Meredith Pel, MD;  Location: [place];  Service: Orthopedics;  Laterality: Right;   CHOLECYSTECTOMY     DILATION AND CURETTAGE OF UTERUS     EYE SURGERY Right    SHOULDER ARTHROSCOPY WITH SUBACROMIAL DECOMPRESSION Right 05/02/2016   Procedure: RIGHT SHOULDER ARTHROSCOPY WITH SUBACROMIAL DECOMPRESSION, MINI OPEN ROTATOR CUFF TEAR REPAIR, AND RIGHT CARPAL  TUNNEL RELEASE;  Surgeon: Meredith Pel, MD;  Location: [facility];  Service: Orthopedics;  Laterality: Right;   TOTAL KNEE ARTHROPLASTY Right 07/15/2012   Procedure: RIGHT TOTAL KNEE ARTHROPLASTY;  Surgeon: Lorn Junes, MD;  Location: [place];  Service: Orthopedics;  Laterality: Right;    Family History  Problem Relation Age of Onset   CAD Father 17   CAD Mother 79   Hypertension Mother     Social History   Socioeconomic History   Marital status: Legally Separated    Spouse name: Not on file   Number of children: 3   Years of education: Not on file   Highest education level: Not on file  Occupational History    Employer: [id number]  Tobacco Use   Smoking status: Former    Packs/day: 0.50    Years: 15.00    Pack years: 7.50    Types: Cigarettes    Quit date: 08/13/2020    Years since quitting: 0.0   Smokeless tobacco: Never   Tobacco comments:    Smoking 3-4 cigs/day, stopped smoking on 08/13/20  Substance and Sexual Activity   Alcohol use: Yes    Alcohol/week: 0.0 standard drinks    Comment: ocassionally   Drug use: No   Sexual activity: Never  Other Topics Concern   Not on file  Social History Narrative   Lives with daughter and two grandsons.    Social Determinants of Health   Financial Resource Strain: Not on file  Food Insecurity: Not on file  Transportation Needs: Not on file  Physical Activity: Not on file  Stress: Not on file  Social Connections: Not on file  Intimate Partner Violence: Not on file    Review of Systems  Constitutional: Negative.   HENT: Negative.    Eyes: Negative.   Respiratory:  Positive for cough and shortness of breath.   Cardiovascular: Negative.   Gastrointestinal: Negative.   Genitourinary: Negative.   Musculoskeletal: Negative.    Vitals:   08/17/20 1208  BP: 130/80  Pulse: 79  Temp: (!) 97.3 F (36.3 C)  SpO2: 100%     Physical Exam Constitutional:      Appearance: Normal appearance.  HENT:     Head:  Normocephalic and atraumatic.     Nose: No congestion.  Cardiovascular:     Rate and Rhythm: Normal rate and regular rhythm.     Pulses: Normal pulses.     Heart sounds: Normal heart sounds. No murmur heard.   No friction rub.  Pulmonary:     Effort: Pulmonary effort is normal. No respiratory distress.     Breath sounds: No stridor. Rhonchi present. No wheezing.  Musculoskeletal:     Cervical back: No rigidity or tenderness.  Neurological:     Mental Status: She is  alert.  Psychiatric:        Mood and Affect: Mood normal.   Data Reviewed: Echocardiogram 06/06/2019: Ejection fraction of 65 to 70%, dilated atria no mention of pulmonary hypertension Pulmonary function test 08/21/2019 significant for mild obstructive disease with no significant bronchodilator response  Assessment:   COPD with exacerbation Stage I COPD -Continue Trelegy -Albuterol as needed -Prescription for short course of steroids prednisone 20 daily for 7 days -Doxycycline 100 p.o. twice daily for 7 days  Continues to work on quitting smoking -She states she has not been quit smoking lately  Plan/Recommendations: .  Continue Trelegy 100 daily  .  Encouraged to consider staying off cigarettes throughout  .  Tentative follow-up in 6 months  .  Encouraged to call with any significant concerns   Sherrilyn Rist MD [facility] 08/17/2020, 12:15 PM  CC: Ladell Pier, MD

## 2020-08-17 NOTE — Patient Instructions (Addendum)
I will see you back in 6 months  Prescription for doxycycline and prednisone sent into pharmacy   Continue trelegy   Call with significant concerns

## 2020-08-31 ENCOUNTER — Other Ambulatory Visit: Payer: Self-pay | Admitting: Internal Medicine

## 2020-08-31 DIAGNOSIS — R7303 Prediabetes: Secondary | ICD-10-CM

## 2020-09-12 DIAGNOSIS — J449 Chronic obstructive pulmonary disease, unspecified: Secondary | ICD-10-CM | POA: Diagnosis not present

## 2020-09-29 ENCOUNTER — Other Ambulatory Visit: Payer: Self-pay | Admitting: Internal Medicine

## 2020-09-29 DIAGNOSIS — K5909 Other constipation: Secondary | ICD-10-CM

## 2020-09-29 DIAGNOSIS — J449 Chronic obstructive pulmonary disease, unspecified: Secondary | ICD-10-CM

## 2020-09-29 NOTE — Telephone Encounter (Signed)
Requested medication (s) are due for refill today - yes  Requested medication (s) are on the active medication list -yes  Future visit scheduled -no  Last refill: 07/03/20  Notes to clinic: Request Rf: medication not assigned to protocol  Requested Prescriptions  Pending Prescriptions Disp [place] 100-62.5-25 MCG/INH AEPB [Pharmacy Med Name: TRELEGY ELLIPTA 100-62.5-25 MCG/INH Aerosol Powder Breath Activated] 180 each 1    Sig: INHALE 1 PUFF INTO THE LUNGS EVERY MORNING.     Off-Protocol Failed - 09/29/2020  4:04 PM      Failed - Medication not assigned to a protocol, review manually.      Passed - Valid encounter within last 12 months    Recent Outpatient Visits           2 months ago Stage 1 mild COPD by GOLD classification [facility])   [place] Ladell Pier, MD   5 months ago No-show for appointment   [place] Ladell Pier, MD   7 months ago Acute bronchitis with COPD [facility])   [place] Gildardo Pounds, NP   9 months ago Encounter for [employer] annual wellness exam   [place] Ladell Pier, MD   10 months ago Essential hypertension   [place], MD              Signed Prescriptions Disp Refills   LINZESS 145 MCG CAPS capsule 90 capsule 1    Sig: TAKE 1 [place]     Gastroenterology: Irritable Bowel Syndrome Passed - 09/29/2020  4:04 PM      Passed - Valid encounter within last 12 months    Recent Outpatient Visits           2 months ago Stage 1 mild COPD by GOLD classification [facility])   [place] Ladell Pier, MD   5 months ago No-show for appointment   [place] Ladell Pier, MD   7 months ago Acute bronchitis with COPD [facility])   [place] Gildardo Pounds, NP   9 months ago Encounter for [employer] annual wellness exam   [place] Ladell Pier, MD   10 months ago Essential hypertension   [place], Deborah B, MD                 Requested Prescriptions  Pending Prescriptions Disp Refills   TRELEGY ELLIPTA 100-62.5-25 MCG/INH AEPB [Pharmacy Med Name: TRELEGY ELLIPTA 100-62.5-25 MCG/INH Aerosol Powder Breath Activated] 180 each 1    Sig: INHALE 1 PUFF INTO THE LUNGS EVERY MORNING.     Off-Protocol Failed - 09/29/2020  4:04 PM      Failed - Medication not assigned to a protocol, review manually.      Passed - Valid encounter within last 12 months    Recent Outpatient Visits           2 months ago Stage 1 mild COPD by GOLD classification [facility])   [place] Ladell Pier, MD   5 months ago No-show for appointment   [place] Karle Plumber B, MD   7 months ago Acute bronchitis with COPD ([place])   [facility]  [place], Vernia Buff, NP   9 months ago Encounter for [employer] annual wellness exam   [place] Ladell Pier, MD   10 months ago Essential hypertension   [place], MD              Signed Prescriptions Disp Refills   LINZESS 145 MCG CAPS capsule 90 capsule 1    Sig: TAKE [place]     Gastroenterology: Irritable Bowel Syndrome Passed - 09/29/2020  4:04 PM      Passed - Valid encounter within last 12 months    Recent Outpatient Visits           2 months ago Stage 1 mild COPD by GOLD classification [facility])   [place] Ladell Pier, MD   5 months ago No-show for appointment   [place] Ladell Pier, MD   7 months ago Acute bronchitis  with COPD [facility])   [place], Vernia Buff, NP   9 months ago Encounter for [employer] annual wellness exam   [place] Ladell Pier, MD   10 months ago Essential hypertension   [place] Ladell Pier, MD

## 2020-10-13 DIAGNOSIS — J449 Chronic obstructive pulmonary disease, unspecified: Secondary | ICD-10-CM | POA: Diagnosis not present

## 2020-11-03 ENCOUNTER — Other Ambulatory Visit: Payer: Self-pay | Admitting: Internal Medicine

## 2020-11-03 DIAGNOSIS — B353 Tinea pedis: Secondary | ICD-10-CM

## 2020-11-03 NOTE — Telephone Encounter (Signed)
Requested medications are due for refill today.  yes  Requested medications are on the active medications list.  yes  Last refill. 07/12/2020  Future visit scheduled.   no  Notes to clinic.  Medication not assigned to a protocol.

## 2020-11-12 DIAGNOSIS — J449 Chronic obstructive pulmonary disease, unspecified: Secondary | ICD-10-CM | POA: Diagnosis not present

## 2020-11-24 DIAGNOSIS — D2362 Other benign neoplasm of skin of left upper limb, including shoulder: Secondary | ICD-10-CM | POA: Diagnosis not present

## 2020-11-24 DIAGNOSIS — L700 Acne vulgaris: Secondary | ICD-10-CM | POA: Diagnosis not present

## 2020-11-24 DIAGNOSIS — D2371 Other benign neoplasm of skin of right lower limb, including hip: Secondary | ICD-10-CM | POA: Diagnosis not present

## 2020-11-24 DIAGNOSIS — L728 Other follicular cysts of the skin and subcutaneous tissue: Secondary | ICD-10-CM | POA: Diagnosis not present

## 2020-12-01 ENCOUNTER — Ambulatory Visit: Payer: Medicare HMO | Admitting: Surgical

## 2020-12-07 ENCOUNTER — Other Ambulatory Visit: Payer: Self-pay | Admitting: Internal Medicine

## 2020-12-07 DIAGNOSIS — Z1231 Encounter for screening mammogram for malignant neoplasm of breast: Secondary | ICD-10-CM

## 2020-12-13 ENCOUNTER — Other Ambulatory Visit: Payer: Self-pay

## 2020-12-13 ENCOUNTER — Encounter: Payer: Self-pay | Admitting: Orthopedic Surgery

## 2020-12-13 ENCOUNTER — Ambulatory Visit (INDEPENDENT_AMBULATORY_CARE_PROVIDER_SITE_OTHER): Payer: Medicare HMO | Admitting: Orthopedic Surgery

## 2020-12-13 DIAGNOSIS — G8929 Other chronic pain: Secondary | ICD-10-CM

## 2020-12-13 DIAGNOSIS — M25511 Pain in right shoulder: Secondary | ICD-10-CM | POA: Diagnosis not present

## 2020-12-13 DIAGNOSIS — M5412 Radiculopathy, cervical region: Secondary | ICD-10-CM

## 2020-12-13 DIAGNOSIS — J449 Chronic obstructive pulmonary disease, unspecified: Secondary | ICD-10-CM | POA: Diagnosis not present

## 2020-12-13 MED ORDER — ACETAMINOPHEN-CODEINE #3 300-30 MG PO TABS: ORAL_TABLET | ORAL | 0 refills | Status: DC

## 2020-12-14 ENCOUNTER — Encounter: Payer: Self-pay | Admitting: Orthopedic Surgery

## 2020-12-14 ENCOUNTER — Telehealth: Payer: Self-pay

## 2020-12-14 NOTE — Telephone Encounter (Signed)
PA initiated on [email or web address] Information is under further review with [employer].

## 2020-12-14 NOTE — Progress Notes (Signed)
Office Visit Note   Patient: Alison Ford           Date of Birth: 06/15/1955           MRN: [id number] Visit Date: 12/13/2020 Requested by: Ladell Pier, MD [place],  [place] [address] PCP: Ladell Pier, MD  Subjective: Chief Complaint  Patient presents with   Right Shoulder - Follow-up    HPI: Alison Ford is a 65 year old patient with right-sided arm and shoulder and neck pain.  She has been ambulating with a cane.  States that she has completed physical therapy.  Hurts on the whole right side including the shoulder and arm.  Taking extra strength Tylenol.  Requesting Tylenol 3.  States that her grip strength is decreasing.  Patient is a smoker.  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  Recommended cervical spine ESI but she states that she will not do that under any circumstance.  She would rather have surgery.  MRI scan of the shoulder is pending              ROS: All systems reviewed are negative as they relate to the chief complaint within the history of present illness.  Patient denies  fevers or chills.   Assessment & Plan: Visit Diagnoses:  1. Chronic right shoulder pain   2. Radiculopathy, cervical region     Plan: Impression is right-sided radiculopathy involving the entire arm which is giving her some grip weakness.  She is a smoker so I do not know if she is a candidate for any type of cervical spine fusion procedure.  MRI of the shoulder is pending.  I think it is possible but unlikely that she has symptomatic rotator cuff pathology.  That would not explain her whole arm symptoms as well as grip strength weakness.  Some of her symptoms also improved when she puts her arm up overhead.  Plan is referral to Dr. Lorin Mercy for evaluation for surgical intervention.  I do not know if she is a great surgical candidate.  I will see her back after her the MRI arthrogram of the shoulder.  Tylenol 3 prescribed 1 time  only  Follow-Up Instructions: No follow-ups on file.   Orders:  Orders Placed This Encounter  Procedures   MR SHOULDER RIGHT W CONTRAST   Arthrogram   Ambulatory referral to Orthopedic Surgery   Meds ordered this encounter  Medications   acetaminophen-codeine (TYLENOL #3) 300-30 MG tablet    Sig: 1 po q 12 hrs prn pain    Dispense:  28 tablet    Refill:  0      Procedures: No procedures performed   Clinical Data: No additional findings.  Objective: Vital Signs: There were no vitals taken for this visit.  Physical Exam:   Constitutional: Patient appears well-developed HEENT:  Head: Normocephalic Eyes:EOM are normal Neck: Normal range of motion Cardiovascular: Normal rate Pulmonary/chest: Effort normal Neurologic: Patient is alert Skin: Skin is warm Psychiatric: Patient has normal mood and affect   Ortho Exam: Ortho exam demonstrates good cervical spine range of motion.  Mild pain with rotation to the right.  No definite paresthesias C5-T1.  Rotator cuff strength is pretty reasonable but she does have diminished grip strength on the right compared to the left.  Biceps triceps abduction strength also 5- out of 5 on the right compared to the left.  Reflexes symmetric biceps triceps.  Radial pulse intact bilaterally.  Not too much coarseness or grinding in that shoulder region.  No muscle atrophy in the spinatus fossae  Specialty Comments:  No specialty comments available.  Imaging: No results found.   PMFS History: Patient Active Problem List   Diagnosis Date Noted   Class 1 obesity due to excess calories with serious comorbidity and body mass index (BMI) of 31.0 to 31.9 in adult 05/15/2019   Prediabetes 05/15/2019   Iron deficiency anemia 27/74/1287   Systolic ejection murmur 86/76/7209   Hypoxemia requiring supplemental oxygen 05/15/2019   Normocytic anemia 04/29/2019   Chronic obstructive pulmonary disease ([place]) 05/30/2016   Rotator cuff tear 05/02/2016    Rotator cuff tendonitis, right 12/30/2015   Cervicalgia 12/30/2015   Eczema, dyshidrotic 11/18/2014   Acne 11/18/2014   Smoker 06/01/2014   Right knee DJD    Hyperlipidemia    HTN (hypertension) 06/05/2012   Past Medical History:  Diagnosis Date   Abnormal facial hair    Back pain    d/t knee pain   Headache(784.0)    Heart murmur    slight   HTN (hypertension)    takes Amlodipine daily and HCTZ    Hyperlipidemia    takes Lovastatin nightly   Joint pain    Right knee DJD    Weakness    left hand    Family History  Problem Relation Age of Onset   CAD Father 47   CAD Mother 65   Hypertension Mother     Past Surgical History:  Procedure Laterality Date   ABDOMINAL HYSTERECTOMY     Athroscopic knee surgery     Bilateral   CARPAL TUNNEL RELEASE Right 05/02/2016   Procedure: CARPAL TUNNEL RELEASE;  Surgeon: Meredith Pel, MD;  Location: [facility];  Service: Orthopedics;  Laterality: Right;   CHOLECYSTECTOMY     DILATION AND CURETTAGE OF UTERUS     EYE SURGERY Right    SHOULDER ARTHROSCOPY WITH SUBACROMIAL DECOMPRESSION Right 05/02/2016   Procedure: RIGHT SHOULDER ARTHROSCOPY WITH SUBACROMIAL DECOMPRESSION, MINI OPEN ROTATOR CUFF TEAR REPAIR, AND RIGHT CARPAL TUNNEL RELEASE;  Surgeon: Meredith Pel, MD;  Location: [facility];  Service: Orthopedics;  Laterality: Right;   TOTAL KNEE ARTHROPLASTY Right 07/15/2012   Procedure: RIGHT TOTAL KNEE ARTHROPLASTY;  Surgeon: Lorn Junes, MD;  Location: [facility];  Service: Orthopedics;  Laterality: Right;   Social History   Occupational History    Employer: [id number]  Tobacco Use   Smoking status: Former    Packs/day: 0.50    Years: 15.00    Pack years: 7.50    Types: Cigarettes    Quit date: 08/13/2020    Years since quitting: 0.3   Smokeless tobacco: Never   Tobacco comments:    Smoking 3-4 cigs/day, stopped smoking on 08/13/20  Substance and Sexual Activity   Alcohol use: Yes    Alcohol/week: 0.0 standard drinks     Comment: ocassionally   Drug use: No   Sexual activity: Never

## 2020-12-15 ENCOUNTER — Other Ambulatory Visit: Payer: Self-pay | Admitting: Orthopedic Surgery

## 2020-12-16 ENCOUNTER — Ambulatory Visit
Admission: RE | Admit: 2020-12-16 | Discharge: 2020-12-16 | Disposition: A | Discharge status: Home / Self Care | Payer: Medicare HMO | Source: Ambulatory Visit | Attending: Internal Medicine | Admitting: Internal Medicine

## 2020-12-16 DIAGNOSIS — Z1231 Encounter for screening mammogram for malignant neoplasm of breast: Secondary | ICD-10-CM | POA: Diagnosis not present

## 2020-12-17 ENCOUNTER — Telehealth: Payer: Self-pay

## 2020-12-17 NOTE — Telephone Encounter (Signed)
Pt was called and is aware of results, DOB was confirmed.  ?

## 2020-12-17 NOTE — Telephone Encounter (Signed)
-----   Message from Carilyn Goodpasture, RN sent at 12/17/2020 11:42 AM EST -----  ----- Message ----- From: Ladell Pier, MD Sent: 12/17/2020   7:30 AM EST To: Jackelyn Knife, RMA  Mammogram is normal.  Will  be due again in 1 year.

## 2020-12-21 ENCOUNTER — Telehealth: Payer: Self-pay | Admitting: Orthopedic Surgery

## 2020-12-21 ENCOUNTER — Other Ambulatory Visit: Payer: Self-pay | Admitting: Surgical

## 2020-12-21 NOTE — Telephone Encounter (Signed)
Patient called. She would like a refill on Tylenol #3. Pharmacy only gave 10 pills for 5 days. Her call back number is [phone]

## 2020-12-21 NOTE — Telephone Encounter (Signed)
IC advised.  

## 2020-12-21 NOTE — Telephone Encounter (Signed)
Based on last note, Dr Marlou Sa did not want continuous refills of Tylenol with codeine. I can refill one last time since she only had a limited supply but after this refill, recommend OTC tylenol and NSAID if she is allowed to take these by her PCP

## 2020-12-24 ENCOUNTER — Ambulatory Visit: Payer: Medicare HMO | Admitting: Orthopaedic Surgery

## 2020-12-27 ENCOUNTER — Telehealth: Payer: Self-pay | Admitting: Orthopedic Surgery

## 2020-12-27 MED ORDER — ACETAMINOPHEN-CODEINE #3 300-30 MG PO TABS: 1.0000 | ORAL_TABLET | Freq: Two times a day (BID) | ORAL | 0 refills | Status: AC | PRN

## 2020-12-27 NOTE — Telephone Encounter (Signed)
Tried calling patient. No answer. LM for patient to call back. Need to know what medications she is asking to be refilled.

## 2020-12-27 NOTE — Telephone Encounter (Signed)
Sent in refill for tylenol 3 since PDMP states she has not picked up 12/6 RX. As in last message, this will be last RX for this med and then she needs OTC meds

## 2020-12-27 NOTE — Telephone Encounter (Signed)
Pt is asking for tylenol 3. Pt states her insurance don't pay for 28 days. Script has to be broken into 2. Please call pt at [phone].

## 2020-12-27 NOTE — Telephone Encounter (Signed)
Pt calling to ask about prescription refills for her. Pt asked for a call back and the best call back number is [phone].

## 2020-12-28 ENCOUNTER — Ambulatory Visit: Payer: Medicare HMO

## 2020-12-28 ENCOUNTER — Ambulatory Visit: Admission: RE | Admit: 2020-12-28 | Payer: Medicare HMO | Source: Ambulatory Visit

## 2020-12-28 ENCOUNTER — Ambulatory Visit: Payer: Medicare HMO | Admitting: Physician Assistant

## 2020-12-28 ENCOUNTER — Ambulatory Visit: Payer: Self-pay | Admitting: *Deleted

## 2020-12-28 ENCOUNTER — Other Ambulatory Visit: Payer: Self-pay

## 2020-12-28 DIAGNOSIS — J449 Chronic obstructive pulmonary disease, unspecified: Secondary | ICD-10-CM

## 2020-12-28 DIAGNOSIS — U071 COVID-19: Secondary | ICD-10-CM | POA: Diagnosis not present

## 2020-12-28 MED ORDER — BENZONATATE 200 MG PO CAPS: ORAL_CAPSULE | ORAL | 0 refills | Status: DC

## 2020-12-28 NOTE — Progress Notes (Signed)
Established Patient Office Visit  Subjective:  Patient ID: Alison Ford, female    DOB: [birth year]  Age: 65 y.o. MRN: [id number]  CC:  Chief Complaint  Patient presents with   Covid Positive    Virtual Visit via Telephone Note  I connected with Alison Ford on 12/28/20 at 10:30 AM EST by telephone and verified that I am speaking with the correct person using two identifiers.  Location: Patient: Car  Provider: [facility]    I discussed the limitations, risks, security and privacy concerns of performing an evaluation and management service by telephone and the availability of in person appointments. I also discussed with the patient that there may be a patient responsible charge related to this service. The patient expressed understanding and agreed to proceed.   History of Present Illness: Reports that she started experiencing a productive cough with clear phlegm, headache, sore throat and runny nose 5 days ago.  Reports that she has been using Coricidin with moderate relief.  Denies fever, nausea, vomiting, body aches, chills.  Denies sick contacts.  States that she tested herself last night with positive COVID test x2 and again this morning.  Eating and drinking okay, sleeping okay.  States that she had her first booster COVID booster in February 2022.     Observations/Objective: Medical history and current medications reviewed, no physical exam completed   Past Medical History:  Diagnosis Date   Abnormal facial hair    Back pain    d/t knee pain   Headache(784.0)    Heart murmur    slight   HTN (hypertension)    takes Amlodipine daily and HCTZ    Hyperlipidemia    takes Lovastatin nightly   Joint pain    Right knee DJD    Weakness    left hand    Past Surgical History:  Procedure Laterality Date   ABDOMINAL HYSTERECTOMY     Athroscopic knee surgery     Bilateral   CARPAL TUNNEL RELEASE Right 05/02/2016   Procedure: CARPAL  TUNNEL RELEASE;  Surgeon: Meredith Pel, MD;  Location: [facility];  Service: Orthopedics;  Laterality: Right;   CHOLECYSTECTOMY     DILATION AND CURETTAGE OF UTERUS     EYE SURGERY Right    SHOULDER ARTHROSCOPY WITH SUBACROMIAL DECOMPRESSION Right 05/02/2016   Procedure: RIGHT SHOULDER ARTHROSCOPY WITH SUBACROMIAL DECOMPRESSION, MINI OPEN ROTATOR CUFF TEAR REPAIR, AND RIGHT CARPAL TUNNEL RELEASE;  Surgeon: Meredith Pel, MD;  Location: [facility];  Service: Orthopedics;  Laterality: Right;   TOTAL KNEE ARTHROPLASTY Right 07/15/2012   Procedure: RIGHT TOTAL KNEE ARTHROPLASTY;  Surgeon: Lorn Junes, MD;  Location: [facility];  Service: Orthopedics;  Laterality: Right;    Family History  Problem Relation Age of Onset   CAD Father 65   CAD Mother 5   Hypertension Mother     Social History   Socioeconomic History   Marital status: Legally Separated    Spouse name: Not on file   Number of children: 3   Years of education: Not on file   Highest education level: Not on file  Occupational History    Employer: [employer]  Tobacco Use   Smoking status: Former    Packs/day: 0.50    Years: 15.00    Pack years: 7.50    Types: Cigarettes    Quit date: 08/13/2020    Years since quitting: 0.3   Smokeless tobacco: Never   Tobacco comments:  Smoking 3-4 cigs/day, stopped smoking on 08/13/20  Substance and Sexual Activity   Alcohol use: Yes    Alcohol/week: 0.0 standard drinks    Comment: ocassionally   Drug use: No   Sexual activity: Never  Other Topics Concern   Not on file  Social History Narrative   Lives with daughter and two grandsons.    Social Determinants of Health   Financial Resource Strain: Not on file  Food Insecurity: Not on file  Transportation Needs: Not on file  Physical Activity: Not on file  Stress: Not on file  Social Connections: Not on file  Intimate Partner Violence: Not on file    Outpatient Medications Prior to Visit  Medication Sig Dispense Refill    acetaminophen-codeine (TYLENOL #3) 300-30 MG tablet Take 1 tablet by mouth every 12 (twelve) hours as needed for up to 10 days for moderate pain. 20 tablet 0   albuterol (PROVENTIL) (2.5 MG/3ML) 0.083% nebulizer solution Take 3 mLs (2.5 mg total) by nebulization every 6 (six) hours as needed for wheezing or shortness of breath. 150 mL 1   albuterol (VENTOLIN HFA) 108 (90 Base) MCG/ACT inhaler 2 puffs QID as needed 18 g 2   amLODipine (NORVASC) 10 MG tablet Take 1 tablet (10 mg total) by mouth daily. 90 tablet 2   aspirin EC 81 MG tablet Take 1 tablet (81 mg total) by mouth daily. 90 tablet 3   Blood Glucose Monitoring Suppl (TRUE METRIX METER) w/Device KIT Use as directed 1 kit 0   cetirizine (ZYRTEC) 10 MG tablet Take 1 tablet (10 mg total) by mouth daily. 30 tablet 6   clotrimazole-betamethasone (LOTRISONE) cream APPLY CREAM TOPICALLY TO AFFECTED AREA TWICE DAILY 30 g 0   cromolyn (OPTICROM) 4 % ophthalmic solution Place 1 drop into both eyes 4 (four) times daily.  98   ferrous sulfate 325 (65 FE) MG tablet Take 1 tablet (325 mg total) by mouth daily with breakfast. 100 tablet 1   glucose blood (TRUE METRIX BLOOD GLUCOSE TEST) test strip Use to check blood sugar once daily. 100 each 4   LINZESS 145 MCG CAPS capsule TAKE 1 CAPSULE EVERY DAY BEFORE BREAKFAST 90 capsule 1   lisinopril-hydrochlorothiazide (ZESTORETIC) 20-25 MG tablet Take 1 tablet by mouth daily. 90 tablet 2   lovastatin (MEVACOR) 40 MG tablet TAKE 1 TABLET AT BEDTIME 90 tablet 2   metFORMIN (GLUCOPHAGE) 500 MG tablet TAKE 1/2 (ONE-HALF) TABLET BY MOUTH ONCE DAILY WITH BREAKFAST 45 tablet 2   nicotine polacrilex (NICORETTE) 2 MG gum Take 1 each (2 mg total) by mouth as needed for smoking cessation. 100 tablet 3   TRELEGY ELLIPTA 100-62.5-25 MCG/INH AEPB INHALE 1 PUFF INTO THE LUNGS EVERY MORNING. 180 each 1   TRUEplus Lancets 28G MISC Use as directed 100 each 4   benzonatate (TESSALON) 200 MG capsule TAKE 1 CAPSULE BY MOUTH TWICE  DAILY AS NEEDED FOR COUGH 20 capsule 0   pantoprazole (PROTONIX) 40 MG tablet Take 1 tablet (40 mg total) by mouth daily. 90 tablet 0   doxycycline (VIBRA-TABS) 100 MG tablet Take 1 tablet (100 mg total) by mouth 2 (two) times daily. 14 tablet 0   predniSONE (DELTASONE) 20 MG tablet Take 1 tablet (20 mg total) by mouth daily with breakfast. 7 tablet 0   promethazine-dextromethorphan (PROMETHAZINE-DM) 6.25-15 MG/5ML syrup TAKE 2 & 1/2 (TWO & ONE-HALF) ML BY MOUTH  EVERY 12 HOURS AS NEEDED FOR COUGH 120 mL 0   No facility-administered medications prior to  visit.    Allergies  Allergen Reactions   Penicillins Itching and Other (See Comments)    Burning and whelps Has patient had a PCN reaction causing immediate rash, facial/tongue/throat swelling, SOB or lightheadedness with hypotension: NO Has patient had a PCN reaction causing severe rash involving mucus membranes or skin necrosis: NO Has patient had a PCN reaction that required hospitalization: NO Has patient had a PCN reaction occurring within the last 10 years: NO If all of the above answers are "NO", then may proceed with Cephalosporin use.    ROS Review of Systems  Constitutional:  Negative for chills and fever.  HENT:  Positive for congestion, rhinorrhea and sore throat. Negative for ear pain, sinus pressure, sinus pain and trouble swallowing.   Eyes: Negative.   Respiratory:  Positive for cough. Negative for shortness of breath and wheezing.   Cardiovascular:  Negative for chest pain.  Gastrointestinal:  Negative for abdominal pain, diarrhea, nausea and vomiting.  Endocrine: Negative.   Genitourinary: Negative.   Musculoskeletal: Negative.   Skin: Negative.   Allergic/Immunologic: Negative.   Neurological:  Positive for headaches. Negative for dizziness and weakness.  Hematological: Negative.   Psychiatric/Behavioral: Negative.       Objective:     There were no vitals taken for this visit. Wt Readings from Last 3  Encounters:  08/17/20 192 lb 3.2 oz (87.2 kg)  12/26/19 194 lb 3.2 oz (88.1 kg)  09/19/19 197 lb (89.4 kg)     Health Maintenance Due  Topic Date Due   Pneumonia Vaccine 47+ Years old (1 - PCV) Never done   DEXA SCAN  Never done   COVID-19 Vaccine (4 - Booster for Pfizer series) 05/04/2020   INFLUENZA VACCINE  08/16/2020   OPHTHALMOLOGY EXAM  10/08/2020    There are no preventive care reminders to display for this patient.  Lab Results  Component Value Date   TSH 0.57 09/29/2015   Lab Results  Component Value Date   WBC 6.2 04/30/2019   HGB 10.1 (L) 04/30/2019   HCT 33.5 (L) 04/30/2019   MCV 80 04/30/2019   PLT 317 04/30/2019   Lab Results  Component Value Date   NA 140 11/26/2019   K 4.6 11/26/2019   CO2 23 11/26/2019   GLUCOSE 99 11/26/2019   BUN 13 11/26/2019   CREATININE 0.86 11/26/2019   BILITOT 0.2 11/26/2019   ALKPHOS 79 11/26/2019   AST 19 11/26/2019   ALT 18 11/26/2019   PROT 6.8 11/26/2019   ALBUMIN 4.4 11/26/2019   CALCIUM 10.2 11/26/2019   ANIONGAP 7 04/27/2016   Lab Results  Component Value Date   CHOL 219 (H) 11/26/2019   Lab Results  Component Value Date   HDL 59 11/26/2019   Lab Results  Component Value Date   LDLCALC 140 (H) 11/26/2019   Lab Results  Component Value Date   TRIG 113 11/26/2019   Lab Results  Component Value Date   CHOLHDL 3.7 11/26/2019   Lab Results  Component Value Date   HGBA1C 5.6 11/26/2019      Assessment & Plan:   Problem List Items Addressed This Visit       Respiratory   Chronic obstructive pulmonary disease ([place]) - Primary (Chronic)   Relevant Medications   benzonatate (TESSALON) 200 MG capsule   Other Visit Diagnoses     COVID-19       Relevant Medications   benzonatate (TESSALON) 200 MG capsule       Meds ordered  this encounter  Medications   benzonatate (TESSALON) 200 MG capsule    Sig: TAKE 1 CAPSULE BY MOUTH TWICE DAILY AS NEEDED FOR COUGH    Dispense:  20 capsule     Refill:  0    Order Specific Question:   Supervising Provider    Answer:   Elsie Stain [[id number]]   Assessment and Plan: 1. COVID-19 Patient at end of window for antiviral treatment, COVID risk of complication score 4,, last GFR over 1 year ago.  Patient education given on supportive care, trial Tessalon Perles.  Red flags given for prompt reevaluation. - benzonatate (TESSALON) 200 MG capsule; TAKE 1 CAPSULE BY MOUTH TWICE DAILY AS NEEDED FOR COUGH  Dispense: 20 capsule; Refill: 0  2. Chronic obstructive pulmonary disease, unspecified COPD type ([place])  No AVS was created, patient declines my chart.   Follow Up Instructions:    I discussed the assessment and treatment plan with the patient. The patient was provided an opportunity to ask questions and all were answered. The patient agreed with the plan and demonstrated an understanding of the instructions.   The patient was advised to call back or seek an in-person evaluation if the symptoms worsen or if the condition fails to improve as anticipated.  I provide 19 minutes of non-face-to-face time during this encounter.   Follow-up: Return if symptoms worsen or fail to improve.    Loraine Grip Mayers, PA-C

## 2020-12-28 NOTE — Telephone Encounter (Signed)
Pt took a covid test and it was positive / pt has runny nose, cough / please advise / no appts available until 12.27.22/ pt advised to go to mobile unit /   Called patient to review symptoms.   Chief Complaint: covid positive hx COPD Symptoms: runny nose sore throat, productive cough clear mucus, shortness of breath.  Frequency: na  Pertinent Negatives: Patient denies fever Disposition: [] ED /[x] Urgent Care (no appt availability in office) mobile bus  / [] Appointment(In office/virtual)/ []  [facility]/ [] Home Care/ [] Refused Recommended Disposition  Additional Notes: tested positive at home for covid x 2 last night and today . Sx started on Friday 12/24/20. Going to try to go to mobile bus today . Please advise hx COPD. Patient on day 5 of sx.

## 2020-12-28 NOTE — Telephone Encounter (Signed)
Reason for Disposition  [1] HIGH RISK for severe COVID complications (e.g., weak immune system, age > 31 years, obesity with BMI > 25, pregnant, chronic lung disease or other chronic medical condition) AND [2] COVID symptoms (e.g., cough, fever)  (Exceptions: Already seen by PCP and no new or worsening symptoms.)  Answer Assessment - Initial Assessment Questions 1. COVID-19 DIAGNOSIS: "Who made your COVID-19 diagnosis?" "Was it confirmed by a positive lab test or self-test?" If not diagnosed by a doctor (or NP/PA), ask "Are there lots of cases (community spread) where you live?" Note: See public health department website, if unsure.     At home covid test last night and today positive  2. COVID-19 EXPOSURE: "Was there any known exposure to COVID before the symptoms began?" CDC Definition of close contact: within 6 feet (2 meters) for a total of 15 minutes or more over a 24-hour period.      na 3. ONSET: "When did the COVID-19 symptoms start?"      Friday 12/24/20 4. WORST SYMPTOM: "What is your worst symptom?" (e.g., cough, fever, shortness of breath, muscle aches)     Shortness of breath , cough, sore throat, runny nose 5. COUGH: "Do you have a cough?" If Yes, ask: "How bad is the cough?"       Yes productive clear mucus  6. FEVER: "Do you have a fever?" If Yes, ask: "What is your temperature, how was it measured, and when did it start?"     no 7. RESPIRATORY STATUS: "Describe your breathing?" (e.g., shortness of breath, wheezing, unable to speak)      Shortness of breath hx due to COPD 8. BETTER-SAME-WORSE: "Are you getting better, staying the same or getting worse compared to yesterday?"  If getting worse, ask, "In what way?"     na 9. HIGH RISK DISEASE: "Do you have any chronic medical problems?" (e.g., asthma, heart or lung disease, weak immune system, obesity, etc.)     COPD  10. VACCINE: "Have you had the COVID-19 vaccine?" If Yes, ask: "Which one, how many shots, when did you get it?"        na 11. BOOSTER: "Have you received your COVID-19 booster?" If Yes, ask: "Which one and when did you get it?"       na 12. PREGNANCY: "Is there any chance you are pregnant?" "When was your last menstrual period?"       na 13. OTHER SYMPTOMS: "Do you have any other symptoms?"  (e.g., chills, fatigue, headache, loss of smell or taste, muscle pain, sore throat)       Runny nose , cough, sore throat  14. O2 SATURATION MONITOR:  "Do you use an oxygen saturation monitor (pulse oximeter) at home?" If Yes, ask "What is your reading (oxygen level) today?" "What is your usual oxygen saturation reading?" (e.g., 95%)       na  Protocols used: Coronavirus (COVID-19) Diagnosed or Suspected-A-AH

## 2020-12-28 NOTE — Telephone Encounter (Signed)
IC advised pt she verbalized understanding.

## 2020-12-28 NOTE — Progress Notes (Signed)
Patient has eaten and taken medication today Patient reports HA, Runny nose, cough and hoarseness.

## 2020-12-31 ENCOUNTER — Ambulatory Visit: Payer: Medicare HMO | Admitting: Orthopaedic Surgery

## 2021-01-04 ENCOUNTER — Other Ambulatory Visit: Payer: Self-pay | Admitting: Internal Medicine

## 2021-01-04 DIAGNOSIS — B353 Tinea pedis: Secondary | ICD-10-CM

## 2021-01-12 DIAGNOSIS — J449 Chronic obstructive pulmonary disease, unspecified: Secondary | ICD-10-CM | POA: Diagnosis not present

## 2021-01-14 ENCOUNTER — Other Ambulatory Visit: Payer: Self-pay | Admitting: Physician Assistant

## 2021-01-14 DIAGNOSIS — U071 COVID-19: Secondary | ICD-10-CM

## 2021-01-15 DIAGNOSIS — Z0100 Encounter for examination of eyes and vision without abnormal findings: Secondary | ICD-10-CM | POA: Diagnosis not present

## 2021-01-24 ENCOUNTER — Ambulatory Visit
Admission: RE | Admit: 2021-01-24 | Discharge: 2021-01-24 | Disposition: A | Discharge status: Home / Self Care | Payer: Medicare HMO | Source: Ambulatory Visit | Attending: Orthopedic Surgery | Admitting: Orthopedic Surgery

## 2021-01-24 ENCOUNTER — Other Ambulatory Visit: Payer: Self-pay

## 2021-01-24 DIAGNOSIS — S46011A Strain of muscle(s) and tendon(s) of the rotator cuff of right shoulder, initial encounter: Secondary | ICD-10-CM | POA: Diagnosis not present

## 2021-01-24 DIAGNOSIS — M25311 Other instability, right shoulder: Secondary | ICD-10-CM | POA: Diagnosis not present

## 2021-01-24 DIAGNOSIS — G8929 Other chronic pain: Secondary | ICD-10-CM

## 2021-01-24 DIAGNOSIS — M25511 Pain in right shoulder: Secondary | ICD-10-CM

## 2021-01-24 MED ORDER — IOPAMIDOL (ISOVUE-M 200) INJECTION 41%
20.0000 mL | Freq: Once | INTRAMUSCULAR | Status: AC
  Administered 2021-01-24: 20 mL via INTRA_ARTICULAR

## 2021-01-26 ENCOUNTER — Encounter: Payer: Self-pay | Admitting: Orthopaedic Surgery

## 2021-01-26 ENCOUNTER — Ambulatory Visit (INDEPENDENT_AMBULATORY_CARE_PROVIDER_SITE_OTHER): Payer: Medicare HMO | Admitting: Orthopaedic Surgery

## 2021-01-26 ENCOUNTER — Other Ambulatory Visit: Payer: Self-pay

## 2021-01-26 ENCOUNTER — Ambulatory Visit (INDEPENDENT_AMBULATORY_CARE_PROVIDER_SITE_OTHER): Payer: Medicare HMO

## 2021-01-26 VITALS — BP 125/80 | HR 79 | Ht 67.0 in | Wt 192.0 lb

## 2021-01-26 DIAGNOSIS — M5412 Radiculopathy, cervical region: Secondary | ICD-10-CM

## 2021-01-27 NOTE — Progress Notes (Signed)
Office Visit Note   Patient: Alison Ford           Date of Birth: 01/16/1956           MRN: [id number] Visit Date: 01/26/2021              Requested by: Meredith Pel, [place],   [id number] PCP: Ladell Pier, MD   Assessment & Plan: Visit Diagnoses:  1. Radiculopathy, cervical region     Plan: Patient will follow-up with Dr. Marlou Sa concerning her recurrent rotator cuff tear.  This appears to be the principal pain generator for her right shoulder problems.  She needs some therapy for her residual quad weakness after total knee arthroplasty.  Having use her cane with the right hand certainly is not helping her rotator cuff tear.  She can follow-up with Dr. Marlou Sa to decide about possible surgery for recurrent rotator cuff and he can refer to physical therapy for ongoing quad strengthening.  If after shoulder treatment she still having some problems with her neck she could return to see me later.  I discussed at this point I do not think the pain generator is coming from the neck with most likely the recurrent rotator cuff tear.  Follow-Up Instructions: No follow-ups on file.   Orders:  Orders Placed This Encounter  Procedures   XR Cervical Spine 2 or 3 views   No orders of the defined types were placed in this encounter.     Procedures: No procedures performed   Clinical Data: No additional findings.   Subjective: Chief Complaint  Patient presents with   Neck - Pain   Right Shoulder - Pain    HPI 66 year old female seen Dr. Marlou Sa for her shoulder had previous rotator cuff repair is seen with some neck pain and right shoulder pain.  She is ambulatory with a cane that she uses on the right hand and states she has some difficulty walking without the cane.  Previous total knee arthroplasty by Dr. Noemi Chapel years ago.  She has problems with stairs.  Problems with her shoulder overhead reaching denies problems with numbness or tingling in her  fingers.  She notes weakness in her shoulder.  She has had MRI scan of her shoulder and she is following up with Dr. Marlou Sa shortly which showed recurrent rotator cuff tear with retraction.  Previous MRI few years ago showed some cervical spondylosis without significant compression.  She denies myelopathic gait changes.  Review of Systems 14 point system noncontributory to HPI.   Objective: Vital Signs: BP 125/80    Pulse 79    Ht 5\' 7"  (1.702 m)    Wt 192 lb (87.1 kg)    BMI 30.07 kg/m   Physical Exam Constitutional:      Appearance: She is well-developed.  HENT:     Head: Normocephalic.     Right Ear: External ear normal.     Left Ear: External ear normal. There is no impacted cerumen.  Eyes:     Pupils: Pupils are equal, round, and reactive to light.  Neck:     Thyroid: No thyromegaly.     Trachea: No tracheal deviation.  Cardiovascular:     Rate and Rhythm: Normal rate.  Pulmonary:     Effort: Pulmonary effort is normal.  Abdominal:     Palpations: Abdomen is soft.  Musculoskeletal:     Cervical back: No rigidity.  Skin:    General: Skin is warm and  dry.  Neurological:     Mental Status: She is alert and oriented to person, place, and time.  Psychiatric:        Behavior: Behavior normal.    Ortho Exam Pleasant impingement right shoulder positive drop arm test right shoulder negative left shoulder.  Minimal brachial plexus tenderness she can turn her neck rapidly right and left without significant pain.  Proximity and lower extremity reflexes are symmetrical.  Patient has quad weakness although she can do a straight leg raise on the right she has quad weakness that I can overcome with 2 fingers.  Specialty Comments:  No specialty comments available.  Imaging: Narrative & Impression  CLINICAL DATA:  Cervical radiculopathy.   EXAM: MRI CERVICAL SPINE WITHOUT CONTRAST   TECHNIQUE: Multiplanar, multisequence MR imaging of the cervical spine was performed. No  intravenous contrast was administered.   COMPARISON:  03/08/2016 MRI cervical spine and prior. 09/19/2019 cervical spine radiographs.   FINDINGS: Alignment: Straightening of lordosis.   Vertebrae: Normal bone marrow signal intensity. No focal osseous lesion.   Cord: Normal signal and morphology.   Posterior Fossa, vertebral arteries: Negative.   Disc levels: Multilevel desiccation with mild disc space loss.   C2-3: No significant disc bulge, spinal canal or neural foraminal narrowing.   C3-4: Small central protrusion with uncovertebral and bilateral facet degenerative spurring. Mild spinal canal and bilateral neural foraminal narrowing.   C4-5: Central protrusion, uncovertebral and bilateral facet degenerative spurring. Patent spinal canal. Mild bilateral neural foraminal narrowing.   C5-6: Disc osteophyte complex with superimposed right foraminal and right paracentral protrusions. Uncovertebral and facet degenerative spurring. Patent spinal canal and left neural foramen. Mild right neural foraminal narrowing.   C6-7: Mild disc bulge and uncovertebral degenerative spurring. Patent spinal canal and neural foramen.   C7-T1: No significant disc bulge, spinal canal or neural foraminal narrowing.   Paraspinal tissues: Multinodular thyroid. Dominant 1.3 cm right thyroid nodule (8:27).   IMPRESSION: Multilevel spondylosis, grossly unchanged.   Mild C3-4 spinal canal and bilateral neural foraminal narrowing.   Mild bilateral C4-5 and right C5-6 neural foraminal narrowing.     Electronically Signed   By: Primitivo Gauze M.D.   On: 10/20/2019 15:48   AP lateral cervical spine images are obtained and reviewed.  This shows 2 mm retrolisthesis at C3-4.  Mild anterior spurring at C4/5 and C5-6.  Impression: Mid cervical spondylitic changes as described above.  No acute changes.  PMFS History: Patient Active Problem List   Diagnosis Date Noted   COVID-19 12/28/2020    Class 1 obesity due to excess calories with serious comorbidity and body mass index (BMI) of 31.0 to 31.9 in adult 05/15/2019   Prediabetes 05/15/2019   Iron deficiency anemia 16/96/7893   Systolic ejection murmur 81/01/7508   Hypoxemia requiring supplemental oxygen 05/15/2019   Normocytic anemia 04/29/2019   Chronic obstructive pulmonary disease ([place]) 05/30/2016   Rotator cuff tear 05/02/2016   Rotator cuff tendonitis, right 12/30/2015   Cervicalgia 12/30/2015   Eczema, dyshidrotic 11/18/2014   Acne 11/18/2014   Smoker 06/01/2014   Right knee DJD    Hyperlipidemia    HTN (hypertension) 06/05/2012   Past Medical History:  Diagnosis Date   Abnormal facial hair    Back pain    d/t knee pain   Headache(784.0)    Heart murmur    slight   HTN (hypertension)    takes Amlodipine daily and HCTZ    Hyperlipidemia    takes Lovastatin nightly  Joint pain    Right knee DJD    Weakness    left hand    Family History  Problem Relation Age of Onset   CAD Father 59   CAD Mother 62   Hypertension Mother     Past Surgical History:  Procedure Laterality Date   ABDOMINAL HYSTERECTOMY     Athroscopic knee surgery     Bilateral   CARPAL TUNNEL RELEASE Right 05/02/2016   Procedure: CARPAL TUNNEL RELEASE;  Surgeon: Meredith Pel, MD;  Location: [facility];  Service: Orthopedics;  Laterality: Right;   CHOLECYSTECTOMY     DILATION AND CURETTAGE OF UTERUS     EYE SURGERY Right    SHOULDER ARTHROSCOPY WITH SUBACROMIAL DECOMPRESSION Right 05/02/2016   Procedure: RIGHT SHOULDER ARTHROSCOPY WITH SUBACROMIAL DECOMPRESSION, MINI OPEN ROTATOR CUFF TEAR REPAIR, AND RIGHT CARPAL TUNNEL RELEASE;  Surgeon: Meredith Pel, MD;  Location: [facility];  Service: Orthopedics;  Laterality: Right;   TOTAL KNEE ARTHROPLASTY Right 07/15/2012   Procedure: RIGHT TOTAL KNEE ARTHROPLASTY;  Surgeon: Lorn Junes, MD;  Location: [facility];  Service: Orthopedics;  Laterality: Right;   Social History   Occupational  History    Employer: [id number]  Tobacco Use   Smoking status: Former    Packs/day: 0.50    Years: 15.00    Pack years: 7.50    Types: Cigarettes    Quit date: 08/13/2020    Years since quitting: 0.4   Smokeless tobacco: Never   Tobacco comments:    Smoking 3-4 cigs/day, stopped smoking on 08/13/20  Substance and Sexual Activity   Alcohol use: Yes    Alcohol/week: 0.0 standard drinks    Comment: ocassionally   Drug use: No   Sexual activity: Never

## 2021-02-02 DIAGNOSIS — L298 Other pruritus: Secondary | ICD-10-CM | POA: Diagnosis not present

## 2021-02-02 DIAGNOSIS — L728 Other follicular cysts of the skin and subcutaneous tissue: Secondary | ICD-10-CM | POA: Diagnosis not present

## 2021-02-02 DIAGNOSIS — R208 Other disturbances of skin sensation: Secondary | ICD-10-CM | POA: Diagnosis not present

## 2021-02-04 ENCOUNTER — Other Ambulatory Visit: Payer: Self-pay | Admitting: Internal Medicine

## 2021-02-04 ENCOUNTER — Other Ambulatory Visit: Payer: Self-pay | Admitting: Pulmonary Disease

## 2021-02-04 DIAGNOSIS — B353 Tinea pedis: Secondary | ICD-10-CM

## 2021-02-04 DIAGNOSIS — J449 Chronic obstructive pulmonary disease, unspecified: Secondary | ICD-10-CM

## 2021-02-04 NOTE — Telephone Encounter (Signed)
Requested medication (s) are due for refill today: yes  Requested medication (s) are on the active medication list: yes  Last refill:  11/04/20  Future visit scheduled: no  Notes to clinic:  Has already had a curtesy refill and there is no upcoming appointment scheduled.   Requested Prescriptions  Pending Prescriptions Disp Refills   clotrimazole-betamethasone (LOTRISONE) cream [Pharmacy Med Name: Clotrimazole-Betamethasone 1-0.05 % External Cream] 30 g 0    Sig: APPLY  CREAM TOPICALLY TO AFFECTED AREA TWICE DAILY . APPOINTMENT REQUIRED FOR FUTURE REFILLS     Off-Protocol Failed - 02/04/2021  5:05 PM      Failed - Medication not assigned to a protocol, review manually.      Passed - Valid encounter within last 12 months    Recent Outpatient Visits           6 months ago Stage 1 mild COPD by GOLD classification [facility])   [place] Ladell Pier, MD   9 months ago No-show for appointment   [place] Ladell Pier, MD   12 months ago Acute bronchitis with COPD [facility])   [place], Vernia Buff, NP   1 year ago Encounter for [employer] annual wellness exam   [place] Ladell Pier, MD   1 year ago Essential hypertension   [place], MD       Future Appointments             In 5 days Marlou Sa, Tonna Corner, MD [place]   In 1 week Laurin Coder, MD [facility]

## 2021-02-08 ENCOUNTER — Other Ambulatory Visit: Payer: Self-pay | Admitting: Internal Medicine

## 2021-02-08 DIAGNOSIS — B353 Tinea pedis: Secondary | ICD-10-CM

## 2021-02-08 NOTE — Telephone Encounter (Signed)
Requested medication (s) are due for refill today:   Yes  Requested medication (s) are on the active medication list:   Yes  Future visit scheduled:   No.   She has cancelled and been a No Show for last several appts.   Was seen once at the [facility].   Last ordered: 10/20/20022 30 g, 0 refills  Returned because this has already been refused by Abbie Sons, RPH-CPP on 02/07/2021.    Provider to review for further refills.  There is also no protocol assigned to this medication.   Requested Prescriptions  Pending Prescriptions Disp Refills   clotrimazole-betamethasone (LOTRISONE) cream [Pharmacy Med Name: Clotrimazole-Betamethasone 1-0.05 % External Cream] 30 g 0    Sig: APPLY  CREAM TOPICALLY TO AFFECTED AREA TWICE DAILY . APPOINTMENT REQUIRED FOR FUTURE REFILLS     Off-Protocol Failed - 02/08/2021 12:19 PM      Failed - Medication not assigned to a protocol, review manually.      Passed - Valid encounter within last 12 months    Recent Outpatient Visits           7 months ago Stage 1 mild COPD by GOLD classification [facility])   [place] Ladell Pier, MD   9 months ago No-show for appointment   [place] Ladell Pier, MD   1 year ago Acute bronchitis with COPD [facility])   [place], Vernia Buff, NP   1 year ago Encounter for [employer] annual wellness exam   [place] Ladell Pier, MD   1 year ago Essential hypertension   [place], Deborah B, MD       Future Appointments             Tomorrow Marlou Sa, Tonna Corner, MD [place]   In 1 week Laurin Coder, MD [facility]

## 2021-02-09 ENCOUNTER — Ambulatory Visit (INDEPENDENT_AMBULATORY_CARE_PROVIDER_SITE_OTHER): Payer: Medicare HMO | Admitting: Orthopedic Surgery

## 2021-02-09 ENCOUNTER — Other Ambulatory Visit: Payer: Self-pay

## 2021-02-09 ENCOUNTER — Encounter: Payer: Self-pay | Admitting: Orthopedic Surgery

## 2021-02-09 DIAGNOSIS — M25511 Pain in right shoulder: Secondary | ICD-10-CM

## 2021-02-09 DIAGNOSIS — G8929 Other chronic pain: Secondary | ICD-10-CM | POA: Diagnosis not present

## 2021-02-09 NOTE — Progress Notes (Signed)
Office Visit Note   Patient: Alison Ford           Date of Birth: 1955/09/06           MRN: [id number] Visit Date: 02/09/2021 Requested by: Ladell Pier, MD [place],  [place] [id number] PCP: Ladell Pier, MD  Subjective: Chief Complaint  Patient presents with   Right Shoulder - Follow-up    HPI: Alison Ford is a 66 year old patient with right arm and shoulder pain.  Reports throbbing pain in the shoulder and arm.  Increased with lifting and reaching motions.  Pain radiates down into her hands.  Occasionally takes Tylenol for symptoms.  She does not really want an injection in her neck or her shoulder.  She had an MRI scan of the shoulder which shows recurrent rotator cuff tearing the infraspinatus and supraspinatus with retraction of the rotator cuff tears to the glenoid rim.  She had an MRI scan of the cervical spine in October 2021 which shows multilevel spondylosis with mild C3-4 spinal canal and bilateral neuroforaminal narrowing as well as right-sided C5-6 neuroforaminal narrowing.              ROS: All systems reviewed are negative as they relate to the chief complaint within the history of present illness.  Patient denies  fevers or chills.   Assessment & Plan: Visit Diagnoses:  1. Chronic right shoulder pain     Plan: Impression is right arm and shoulder pain with surprising strength and functional range of motion with some loss of strength above 90 degrees of abduction and forward flexion.  Overall her shoulder functions reasonably well based on what the MRI scan looks like.  Based on the amount of retraction of the rotator cuff tendons I do not think recurrent repair is indicated.  Reverse shoulder replacement is discussed with the patient but that is not really going to help her with her function.  I think it could help with some of the pain she is having.  Alternatively this could be possibly related to her neck with last MRI scan about 66 years old.   I think diagnostic injections could be helpful in differentiating pain sources.  However she is not really interested in doing that.  She is requesting and we will try physical therapy upstairs for the right shoulder 2 times a week for 2 weeks.  Come back in 3 to 4 months for clinical recheck to see if this is developing into more of a radicular picture or more of a rotator cuff arthropathy picture.  Follow-Up Instructions: No follow-ups on file.   Orders:  Orders Placed This Encounter  Procedures   Ambulatory referral to Physical Therapy   No orders of the defined types were placed in this encounter.     Procedures: No procedures performed   Clinical Data: No additional findings.  Objective: Vital Signs: There were no vitals taken for this visit.  Physical Exam:   Constitutional: Patient appears well-developed HEENT:  Head: Normocephalic Eyes:EOM are normal Neck: Normal range of motion Cardiovascular: Normal rate Pulmonary/chest: Effort normal Neurologic: Patient is alert Skin: Skin is warm Psychiatric: Patient has normal mood and affect   Ortho Exam: Ortho exam demonstrates passive range of motion on the right of 70/90/150.  Pretty reasonable strength infraspinatus supraspinatus and subscap muscle testing right and left.  Not too much coarse grinding or crepitus with internal or external rotation of the right and left arm at 90 degrees of  abduction.  No masses lymphadenopathy or skin changes in the shoulder region.  No discrete AC joint tenderness is present.  Deltoid fires.  Specialty Comments:  No specialty comments available.  Imaging: No results found.   PMFS History: Patient Active Problem List   Diagnosis Date Noted   COVID-19 12/28/2020   Class 1 obesity due to excess calories with serious comorbidity and body mass index (BMI) of 31.0 to 31.9 in adult 05/15/2019   Prediabetes 05/15/2019   Iron deficiency anemia 45/36/4680   Systolic ejection murmur  05/15/2019   Hypoxemia requiring supplemental oxygen 05/15/2019   Normocytic anemia 04/29/2019   Chronic obstructive pulmonary disease ([place]) 05/30/2016   Rotator cuff tear 05/02/2016   Rotator cuff tendonitis, right 12/30/2015   Cervicalgia 12/30/2015   Eczema, dyshidrotic 11/18/2014   Acne 11/18/2014   Smoker 06/01/2014   Right knee DJD    Hyperlipidemia    HTN (hypertension) 06/05/2012   Past Medical History:  Diagnosis Date   Abnormal facial hair    Back pain    d/t knee pain   Headache(784.0)    Heart murmur    slight   HTN (hypertension)    takes Amlodipine daily and HCTZ    Hyperlipidemia    takes Lovastatin nightly   Joint pain    Right knee DJD    Weakness    left hand    Family History  Problem Relation Age of Onset   CAD Father 74   CAD Mother 66   Hypertension Mother     Past Surgical History:  Procedure Laterality Date   ABDOMINAL HYSTERECTOMY     Athroscopic knee surgery     Bilateral   CARPAL TUNNEL RELEASE Right 05/02/2016   Procedure: CARPAL TUNNEL RELEASE;  Surgeon: Meredith Pel, MD;  Location: [facility];  Service: Orthopedics;  Laterality: Right;   CHOLECYSTECTOMY     DILATION AND CURETTAGE OF UTERUS     EYE SURGERY Right    SHOULDER ARTHROSCOPY WITH SUBACROMIAL DECOMPRESSION Right 05/02/2016   Procedure: RIGHT SHOULDER ARTHROSCOPY WITH SUBACROMIAL DECOMPRESSION, MINI OPEN ROTATOR CUFF TEAR REPAIR, AND RIGHT CARPAL TUNNEL RELEASE;  Surgeon: Meredith Pel, MD;  Location: [place];  Service: Orthopedics;  Laterality: Right;   TOTAL KNEE ARTHROPLASTY Right 07/15/2012   Procedure: RIGHT TOTAL KNEE ARTHROPLASTY;  Surgeon: Lorn Junes, MD;  Location: [facility];  Service: Orthopedics;  Laterality: Right;   Social History   Occupational History    Employer: [id number]  Tobacco Use   Smoking status: Former    Packs/day: 0.50    Years: 15.00    Pack years: 7.50    Types: Cigarettes    Quit date: 08/13/2020    Years since quitting: 0.4   Smokeless  tobacco: Never   Tobacco comments:    Smoking 3-4 cigs/day, stopped smoking on 08/13/20  Substance and Sexual Activity   Alcohol use: Yes    Alcohol/week: 0.0 standard drinks    Comment: ocassionally   Drug use: No   Sexual activity: Never

## 2021-02-12 DIAGNOSIS — J449 Chronic obstructive pulmonary disease, unspecified: Secondary | ICD-10-CM | POA: Diagnosis not present

## 2021-02-16 ENCOUNTER — Ambulatory Visit: Payer: Medicare HMO | Admitting: Pulmonary Disease

## 2021-02-25 ENCOUNTER — Other Ambulatory Visit: Payer: Self-pay | Admitting: Internal Medicine

## 2021-02-25 ENCOUNTER — Other Ambulatory Visit: Payer: Self-pay

## 2021-02-25 ENCOUNTER — Ambulatory Visit: Payer: Self-pay

## 2021-02-25 DIAGNOSIS — B353 Tinea pedis: Secondary | ICD-10-CM

## 2021-02-25 MED ORDER — CLOTRIMAZOLE-BETAMETHASONE 1-0.05 % EX CREA: TOPICAL_CREAM | CUTANEOUS | 0 refills | Status: DC

## 2021-02-25 NOTE — Telephone Encounter (Signed)
Requested Prescriptions  Pending Prescriptions Disp Refills   clotrimazole-betamethasone (LOTRISONE) cream 30 g 0    Sig: APPLY CREAM TOPICALLY TO AFFECTED AREA TWICE DAILY     Off-Protocol Failed - 02/25/2021  6:17 PM      Failed - Medication not assigned to a protocol, review manually.      Passed - Valid encounter within last 12 months    Recent Outpatient Visits          7 months ago Stage 1 mild COPD by GOLD classification [facility])   [place] Ladell Pier, MD   10 months ago No-show for appointment   [place] Ladell Pier, MD   1 year ago Acute bronchitis with COPD [facility])   [place], Vernia Buff, NP   1 year ago Encounter for [employer] annual wellness exam   [place] Ladell Pier, MD   1 year ago Essential hypertension   [place] Ladell Pier, MD

## 2021-02-25 NOTE — Telephone Encounter (Signed)
°  Chief Complaint: athlete's feet  Symptoms: itching in between in toes and on feet Frequency: 2 weeks Pertinent Negatives: Patient denies pain Disposition: [] ED /[] Urgent Care (no appt availability in office) / [] Appointment(In office/virtual)/ []  [place] [facility]/ [x] Home Care/ [] Refused Recommended Disposition /[] [place] [facility]/ []  Follow-up with PCP Additional Notes: advised pt I will send refill for her lotrisone cream    Summary: advice - foot fungus   Pt called in with some concerns regarding fungus on her feet, pt stated none of the over the counter creams have worked for her and needed advice.      Reason for Disposition  Mild athlete's foot  Answer Assessment - Initial Assessment Questions 1. ONSET: "When did the pain start?"      2 weeks  2. LOCATION: "Where is the pain located?"   (e.g., around nail, entire toe, at foot joint)      Both feet in between toes 3. PAIN: "How bad is the pain?"    (Scale 1-10; or mild, moderate, severe)   -  MILD (1-3): doesn't interfere with normal activities    -  MODERATE (4-7): interferes with normal activities (e.g., work or school) or awakens from sleep, limping    -  SEVERE (8-10): excruciating pain, unable to do any normal activities, unable to walk     none 4. APPEARANCE: "What does the toe look like?" (e.g., redness, swelling, bruising, pallor)     redness 5. CAUSE: "What do you think is causing the toe pain?"     Foot fungus  6. OTHER SYMPTOMS: "Do you have any other symptoms?" (e.g., leg pain, rash, fever, numbness)     itching  Protocols used: Toe Pain-A-AH, Athlete's Foot-A-AH

## 2021-03-15 DIAGNOSIS — J449 Chronic obstructive pulmonary disease, unspecified: Secondary | ICD-10-CM | POA: Diagnosis not present

## 2021-03-31 ENCOUNTER — Other Ambulatory Visit: Payer: Self-pay | Admitting: Internal Medicine

## 2021-03-31 NOTE — Telephone Encounter (Signed)
Called pt and made her an appt with Dr. Wynetta Emery for 06/24/2021 at 3:50 for refills.    ? ?

## 2021-04-12 DIAGNOSIS — J449 Chronic obstructive pulmonary disease, unspecified: Secondary | ICD-10-CM | POA: Diagnosis not present

## 2021-04-13 DIAGNOSIS — H2513 Age-related nuclear cataract, bilateral: Secondary | ICD-10-CM | POA: Diagnosis not present

## 2021-04-13 DIAGNOSIS — R7309 Other abnormal glucose: Secondary | ICD-10-CM | POA: Diagnosis not present

## 2021-04-13 DIAGNOSIS — H40013 Open angle with borderline findings, low risk, bilateral: Secondary | ICD-10-CM | POA: Diagnosis not present

## 2021-04-13 DIAGNOSIS — H5213 Myopia, bilateral: Secondary | ICD-10-CM | POA: Diagnosis not present

## 2021-04-13 DIAGNOSIS — H04123 Dry eye syndrome of bilateral lacrimal glands: Secondary | ICD-10-CM | POA: Diagnosis not present

## 2021-04-13 DIAGNOSIS — H10413 Chronic giant papillary conjunctivitis, bilateral: Secondary | ICD-10-CM | POA: Diagnosis not present

## 2021-04-28 ENCOUNTER — Other Ambulatory Visit: Payer: Self-pay | Admitting: Internal Medicine

## 2021-04-28 DIAGNOSIS — I10 Essential (primary) hypertension: Secondary | ICD-10-CM

## 2021-04-28 NOTE — Telephone Encounter (Signed)
Requested medication (s) are due for refill today:   Yes ? ?Requested medication (s) are on the active medication list:   Yes ? ?Future visit scheduled:   Yes 06/24/2021 ? ? ?Last ordered: 07/12/2020 #90, 2 refills ? ?Returned for provider to review for refills prior to upcoming appt.  ? ?Requested Prescriptions  ?Pending Prescriptions Disp Refills  ? amLODipine (NORVASC) 10 MG tablet [Pharmacy Med Name: AMLODIPINE BESYLATE 10 MG Tablet] 90 tablet 2  ?  Sig: TAKE 1 TABLET EVERY DAY  ?  ? Cardiovascular: Calcium Channel Blockers 2 Failed - 04/28/2021 11:09 AM  ?  ?  Failed - Valid encounter within last 6 months  ?  Recent Outpatient Visits   ? ?      ? 9 months ago Stage 1 mild COPD by GOLD classification ([place])  ? [place] Ladell Pier, MD  ? 1 year ago No-show for appointment  ? [place] Karle Plumber B, MD  ? 1 year ago Acute bronchitis with COPD [facility])  ? [place] Gildardo Pounds, NP  ? 1 year ago Encounter for [employer] annual wellness exam  ? [place] Ladell Pier, MD  ? 1 year ago Essential hypertension  ? [place] Ladell Pier, MD  ? ?  ?  ?Future Appointments   ? ?        ? In 1 month Ladell Pier, MD [place]  ? ?  ? ?  ?  ?  Passed - Last BP in normal range  ?  BP Readings from Last 1 Encounters:  ?01/26/21 125/80  ?  ?  ?  ?  Passed - Last Heart Rate in normal range  ?  Pulse Readings from Last 1 Encounters:  ?01/26/21 79  ?  ?  ?  ?  ? ?

## 2021-05-05 DIAGNOSIS — R609 Edema, unspecified: Secondary | ICD-10-CM | POA: Diagnosis not present

## 2021-05-05 DIAGNOSIS — I10 Essential (primary) hypertension: Secondary | ICD-10-CM | POA: Diagnosis not present

## 2021-05-06 ENCOUNTER — Emergency Department (HOSPITAL_COMMUNITY): Payer: Medicare HMO

## 2021-05-06 ENCOUNTER — Emergency Department (HOSPITAL_COMMUNITY)
Admission: EM | Admit: 2021-05-06 | Discharge: 2021-05-06 | Disposition: A | Discharge status: Home / Self Care | Payer: Medicare HMO | Attending: Emergency Medicine | Admitting: Emergency Medicine

## 2021-05-06 ENCOUNTER — Encounter (HOSPITAL_COMMUNITY): Payer: Self-pay | Admitting: Emergency Medicine

## 2021-05-06 DIAGNOSIS — S6991XA Unspecified injury of right wrist, hand and finger(s), initial encounter: Secondary | ICD-10-CM | POA: Diagnosis present

## 2021-05-06 DIAGNOSIS — S52501A Unspecified fracture of the lower end of right radius, initial encounter for closed fracture: Secondary | ICD-10-CM | POA: Insufficient documentation

## 2021-05-06 DIAGNOSIS — S62101A Fracture of unspecified carpal bone, right wrist, initial encounter for closed fracture: Secondary | ICD-10-CM | POA: Diagnosis not present

## 2021-05-06 DIAGNOSIS — S52611A Displaced fracture of right ulna styloid process, initial encounter for closed fracture: Secondary | ICD-10-CM | POA: Diagnosis not present

## 2021-05-06 DIAGNOSIS — M7989 Other specified soft tissue disorders: Secondary | ICD-10-CM | POA: Diagnosis not present

## 2021-05-06 MED ORDER — LIDOCAINE HCL (PF) 1 % IJ SOLN
30.0000 mL | Freq: Once | INTRAMUSCULAR | Status: AC
  Administered 2021-05-06: 30 mL
  Filled 2021-05-06: qty 30

## 2021-05-06 MED ORDER — DOXYCYCLINE HYCLATE 100 MG PO TABS: 200.0000 mg | ORAL_TABLET | Freq: Once | ORAL | Status: DC

## 2021-05-06 MED ORDER — HYDROCODONE-ACETAMINOPHEN 5-325 MG PO TABS
2.0000 | ORAL_TABLET | Freq: Once | ORAL | Status: AC
  Administered 2021-05-06: 2 via ORAL
  Filled 2021-05-06: qty 2

## 2021-05-06 MED ORDER — OXYCODONE-ACETAMINOPHEN 5-325 MG PO TABS: 1.0000 | ORAL_TABLET | ORAL | 0 refills | Status: DC | PRN
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
## 2021-05-06 NOTE — Discharge Instructions (Addendum)
Please call and schedule an appointment with Dr. Brennan Bailey office for evaluation of your wrist fracture.  This will most likely need surgery.  Call his office this morning to make an appointment. ?

## 2021-05-06 NOTE — ED Notes (Signed)
Ortho tech called for pending reduction of right wrist.  ?

## 2021-05-09 DIAGNOSIS — S52531A Colles' fracture of right radius, initial encounter for closed fracture: Secondary | ICD-10-CM | POA: Diagnosis not present

## 2021-05-12 DIAGNOSIS — M25531 Pain in right wrist: Secondary | ICD-10-CM | POA: Diagnosis not present

## 2021-05-12 DIAGNOSIS — S52531A Colles' fracture of right radius, initial encounter for closed fracture: Secondary | ICD-10-CM | POA: Diagnosis not present

## 2021-05-12 DIAGNOSIS — S52571A Other intraarticular fracture of lower end of right radius, initial encounter for closed fracture: Secondary | ICD-10-CM | POA: Diagnosis not present

## 2021-05-13 DIAGNOSIS — J449 Chronic obstructive pulmonary disease, unspecified: Secondary | ICD-10-CM | POA: Diagnosis not present

## 2021-05-30 DIAGNOSIS — S52321D Displaced transverse fracture of shaft of right radius, subsequent encounter for closed fracture with routine healing: Secondary | ICD-10-CM | POA: Diagnosis not present

## 2021-06-12 DIAGNOSIS — J449 Chronic obstructive pulmonary disease, unspecified: Secondary | ICD-10-CM | POA: Diagnosis not present

## 2021-06-22 ENCOUNTER — Encounter: Payer: Self-pay | Admitting: Pharmacist

## 2021-06-22 DIAGNOSIS — Z79899 Other long term (current) drug therapy: Secondary | ICD-10-CM

## 2021-06-22 NOTE — Progress Notes (Signed)
[facility])                                            [place] Team                                        Statin Quality Measure Assessment    06/22/2021  Alison Ford 31-Jan-1955 [id number]   Per review of chart and payor information, patient has a diagnosis of diabetes but is not currently filling a statin prescription.  This places patient into the SUPD (Statin Use In Patients with Diabetes) measure for CMS.    Lovastatin '40mg'$  is on her medication list but has not been filled since 11/22. Patient has an upcoming appointment on 06/24/21.  If deemed therapeutically appropriate, a new prescription for Lovastatin could be sent to the patient's pharmacy, she could be started on another statin, alternative dosing could be explored, or if the patient experienced statin intolerance, a statin intolerance code could be associated with the upcoming appointment.  The 10-year ASCVD risk score (Arnett DK, et al., 2019) is: 8.8%   Values used to calculate the score:     Age: 66 years     Sex: Female     Is Non-Hispanic African American: Yes     Diabetic: No     Tobacco smoker: No     Systolic Blood Pressure: 875 mmHg     Is BP treated: Yes     HDL Cholesterol: 59 mg/dL     Total Cholesterol: 219 mg/dL 11/26/2019     Component Value Date/Time   CHOL 219 (H) 11/26/2019 1051   TRIG 113 11/26/2019 1051   HDL 59 11/26/2019 1051   CHOLHDL 3.7 11/26/2019 1051   CHOLHDL 4.0 03/17/2014 0922   VLDL 41 (H) 03/17/2014 0922   LDLCALC 140 (H) 11/26/2019 1051    Please consider ONE of the following recommendations:  Initiate high intensity statin Atorvastatin '40mg'$  once daily, #90, 3 refills   Rosuvastatin '20mg'$  once daily, #90, 3 refills    Initiate moderate intensity          statin with reduced frequency if prior          statin intolerance 1x weekly, #13, 3 refills   2x weekly, #26, 3 refills   3x weekly, #39, 3 refills     Code for past statin intolerance or  other exclusions (required annually)   Provider Requirements:  Associate code during an office visit or telehealth encounter  Drug Induced Myopathy G72.0   Myopathy, unspecified G72.9   Myositis, unspecified M60.9   Rhabdomyolysis I43.32   Alcoholic fatty liver R51.8   Cirrhosis of liver K74.69   Prediabetes R73.03   PCOS E28.2   Toxic liver disease, unspecified K71.9        Plan: Route note to PCP.  Elayne Guerin, PharmD, [place] Clinical Pharmacist [phone]

## 2021-06-24 ENCOUNTER — Encounter: Payer: Self-pay | Admitting: Internal Medicine

## 2021-06-24 ENCOUNTER — Ambulatory Visit: Payer: Medicare HMO | Attending: Internal Medicine | Admitting: Internal Medicine

## 2021-06-24 VITALS — BP 123/73 | HR 86 | Wt 189.0 lb

## 2021-06-24 DIAGNOSIS — R7303 Prediabetes: Secondary | ICD-10-CM | POA: Diagnosis not present

## 2021-06-24 DIAGNOSIS — F17210 Nicotine dependence, cigarettes, uncomplicated: Secondary | ICD-10-CM | POA: Diagnosis not present

## 2021-06-24 DIAGNOSIS — J449 Chronic obstructive pulmonary disease, unspecified: Secondary | ICD-10-CM | POA: Diagnosis not present

## 2021-06-24 DIAGNOSIS — E785 Hyperlipidemia, unspecified: Secondary | ICD-10-CM

## 2021-06-24 DIAGNOSIS — E663 Overweight: Secondary | ICD-10-CM

## 2021-06-24 DIAGNOSIS — D508 Other iron deficiency anemias: Secondary | ICD-10-CM | POA: Diagnosis not present

## 2021-06-24 DIAGNOSIS — Z6829 Body mass index (BMI) 29.0-29.9, adult: Secondary | ICD-10-CM | POA: Diagnosis not present

## 2021-06-24 DIAGNOSIS — I10 Essential (primary) hypertension: Secondary | ICD-10-CM | POA: Diagnosis not present

## 2021-06-24 DIAGNOSIS — F17200 Nicotine dependence, unspecified, uncomplicated: Secondary | ICD-10-CM

## 2021-06-24 LAB — GLUCOSE, POCT (MANUAL RESULT ENTRY): POC Glucose: 144 mg/dl — AB (ref 70–99)

## 2021-06-24 MED ORDER — LOVASTATIN 40 MG PO TABS: 40.0000 mg | ORAL_TABLET | Freq: Every day | ORAL | 2 refills | Status: DC

## 2021-06-24 MED ORDER — LISINOPRIL-HYDROCHLOROTHIAZIDE 20-25 MG PO TABS: 1.0000 | ORAL_TABLET | Freq: Every day | ORAL | 2 refills | Status: DC

## 2021-06-24 MED ORDER — AMLODIPINE BESYLATE 10 MG PO TABS: 10.0000 mg | ORAL_TABLET | Freq: Every day | ORAL | 1 refills | Status: DC

## 2021-06-24 MED ORDER — METFORMIN HCL 500 MG PO TABS: ORAL_TABLET | ORAL | 1 refills | Status: DC

## 2021-06-24 NOTE — Progress Notes (Signed)
Patient ID: Alison Ford, female    DOB: [birth year]  MRN: [id number]  CC: chronic ds management Subjective: Alison Ford is a 66 y.o. female who presents for chronic ds management Her concerns today include:  Pt with hx of  HTN, preDM, HL, tob dep, COPD stage 1 (Dr. Ander Slade), on home O2, IDA, Vit D def and s/p RT rotator cuff repair and RT CTS release, LT OA knee, chronic constipation, IBS, history of colon polyps.  HTN:  compliant with Norvasc and Lis/HCTZ.  Limits salt in foods Checks BP 2-3 x a wk.  Reports good readings No CP.  SOB off and on. No LE edema  COPD/tob: -still working on trying to quit smoking, can go 2 days without it.  Plans to enroll in smoking cessation class again, found it helpful in the past.  Did well with patches but when she takes it off she starts smoking again Uses O2 when needed mainly when she has to do a lot of walking.  Does not have oxygen tank with her today. -Using Trelegy as prescribed.  Uses albuterol PRN but not every day.  Uses neb PRN  PreDM/overweight: goes to [facility] 2x/wk to walk and ride stationary bike.  She feels her portion sizes are adequate.  Eats a lot of fruits and vegetables. Taking Metformin 500 mg 1/2 pill a day Wgh has stayed stable  HL: Reports compliance with Mevacor 40 mg daily.  Not taking iron supplement anymore.  Has a follow-up appt with [facility] 07/28/21. Patient Active Problem List   Diagnosis Date Noted   COVID-19 12/28/2020   Class 1 obesity due to excess calories with serious comorbidity and body mass index (BMI) of 31.0 to 31.9 in adult 05/15/2019   Prediabetes 05/15/2019   Iron deficiency anemia 58/52/7782   Systolic ejection murmur 42/35/3614   Hypoxemia requiring supplemental oxygen 05/15/2019   Normocytic anemia 04/29/2019   Chronic obstructive pulmonary disease ([place]) 05/30/2016   Rotator cuff tear 05/02/2016   Rotator cuff tendonitis, right 12/30/2015   Cervicalgia 12/30/2015   Eczema, dyshidrotic  11/18/2014   Acne 11/18/2014   Smoker 06/01/2014   Right knee DJD    Hyperlipidemia    HTN (hypertension) 06/05/2012     Current Outpatient Medications on File Prior to Visit  Medication Sig Dispense Refill   albuterol (PROVENTIL) (2.5 MG/3ML) 0.083% nebulizer solution Take 3 mLs (2.5 mg total) by nebulization every 6 (six) hours as needed for wheezing or shortness of breath. 150 mL 1   albuterol (VENTOLIN HFA) 108 (90 Base) MCG/ACT inhaler INHALE 2 PUFFS BY MOUTH 4 TIMES DAILY AS NEEDED 18 g 0   amLODipine (NORVASC) 10 MG tablet TAKE 1 TABLET EVERY DAY 90 tablet 0   aspirin EC 81 MG tablet Take 1 tablet (81 mg total) by mouth daily. 90 tablet 3   benzonatate (TESSALON) 200 MG capsule TAKE 1 CAPSULE BY MOUTH TWICE DAILY AS NEEDED FOR COUGH 20 capsule 0   Blood Glucose Monitoring Suppl (TRUE METRIX METER) w/Device KIT Use as directed 1 kit 0   cetirizine (ZYRTEC) 10 MG tablet Take 1 tablet (10 mg total) by mouth daily. 30 tablet 6   clotrimazole-betamethasone (LOTRISONE) cream APPLY CREAM TOPICALLY TO AFFECTED AREA TWICE DAILY 30 g 0   cromolyn (OPTICROM) 4 % ophthalmic solution Place 1 drop into both eyes 4 (four) times daily.  98   ferrous sulfate 325 (65 FE) MG tablet Take 1 tablet (325 mg total) by mouth daily with  breakfast. 100 tablet 1   glucose blood (TRUE METRIX BLOOD GLUCOSE TEST) test strip Use to check blood sugar once daily. 100 each 4   LINZESS 145 MCG CAPS capsule TAKE 1 CAPSULE EVERY DAY BEFORE BREAKFAST 90 capsule 1   lisinopril-hydrochlorothiazide (ZESTORETIC) 20-25 MG tablet Take 1 tablet by mouth daily. 90 tablet 2   lovastatin (MEVACOR) 40 MG tablet TAKE 1 TABLET AT BEDTIME 90 tablet 2   metFORMIN (GLUCOPHAGE) 500 MG tablet TAKE 1/2 (ONE-HALF) TABLET BY MOUTH ONCE DAILY WITH BREAKFAST 45 tablet 0   nicotine polacrilex (NICORETTE) 2 MG gum Take 1 each (2 mg total) by mouth as needed for smoking cessation. 100 tablet 3   oxyCODONE-acetaminophen (PERCOCET) 5-325 MG tablet  Take 1-2 tablets by mouth every 4 (four) hours as needed. 12 tablet 0   TRELEGY ELLIPTA 100-62.5-25 MCG/INH AEPB INHALE 1 PUFF INTO THE LUNGS EVERY MORNING. 180 each 1   TRUEplus Lancets 28G MISC Use as directed 100 each 4   pantoprazole (PROTONIX) 40 MG tablet Take 1 tablet (40 mg total) by mouth daily. 90 tablet 0   No current facility-administered medications on file prior to visit.    Allergies  Allergen Reactions   Penicillins Itching and Other (See Comments)    Burning and whelps Has patient had a PCN reaction causing immediate rash, facial/tongue/throat swelling, SOB or lightheadedness with hypotension: NO Has patient had a PCN reaction causing severe rash involving mucus membranes or skin necrosis: NO Has patient had a PCN reaction that required hospitalization: NO Has patient had a PCN reaction occurring within the last 10 years: NO If all of the above answers are "NO", then may proceed with Cephalosporin use.    Social History   Socioeconomic History   Marital status: Legally Separated    Spouse name: Not on file   Number of children: 3   Years of education: Not on file   Highest education level: Not on file  Occupational History    Employer: [employer]  Tobacco Use   Smoking status: Former    Packs/day: 0.50    Years: 15.00    Total pack years: 7.50    Types: Cigarettes    Quit date: 08/13/2020    Years since quitting: 0.8   Smokeless tobacco: Never   Tobacco comments:    Smoking 3-4 cigs/day, stopped smoking on 08/13/20  Substance and Sexual Activity   Alcohol use: Yes    Alcohol/week: 0.0 standard drinks of alcohol    Comment: ocassionally   Drug use: No   Sexual activity: Never  Other Topics Concern   Not on file  Social History Narrative   Lives with daughter and two grandsons.    Social Determinants of Health   Financial Resource Strain: Not on file  Food Insecurity: Not on file  Transportation Needs: Not on file  Physical Activity: Not on file   Stress: Not on file  Social Connections: Not on file  Intimate Partner Violence: Not on file    Family History  Problem Relation Age of Onset   CAD Father 25   CAD Mother 53   Hypertension Mother     Past Surgical History:  Procedure Laterality Date   ABDOMINAL HYSTERECTOMY     Athroscopic knee surgery     Bilateral   CARPAL TUNNEL RELEASE Right 05/02/2016   Procedure: CARPAL TUNNEL RELEASE;  Surgeon: Meredith Pel, MD;  Location: [place];  Service: Orthopedics;  Laterality: Right;   CHOLECYSTECTOMY  DILATION AND CURETTAGE OF UTERUS     EYE SURGERY Right    SHOULDER ARTHROSCOPY WITH SUBACROMIAL DECOMPRESSION Right 05/02/2016   Procedure: RIGHT SHOULDER ARTHROSCOPY WITH SUBACROMIAL DECOMPRESSION, MINI OPEN ROTATOR CUFF TEAR REPAIR, AND RIGHT CARPAL TUNNEL RELEASE;  Surgeon: Meredith Pel, MD;  Location: [place];  Service: Orthopedics;  Laterality: Right;   TOTAL KNEE ARTHROPLASTY Right 07/15/2012   Procedure: RIGHT TOTAL KNEE ARTHROPLASTY;  Surgeon: Lorn Junes, MD;  Location: [place];  Service: Orthopedics;  Laterality: Right;    ROS: Review of Systems Negative except as stated above  PHYSICAL EXAM: BP 123/73   Pulse 86   Wt 189 lb (85.7 kg)   SpO2 96%   BMI 29.60 kg/m   Wt Readings from Last 3 Encounters:  06/24/21 189 lb (85.7 kg)  05/06/21 191 lb 12.8 oz (87 kg)  01/26/21 192 lb (87.1 kg)    Physical Exam  General appearance - alert, well appearing, and in no distress Mental status - normal mood, behavior, speech, dress, motor activity, and thought processes Neck - supple, no significant adenopathy Chest - clear to auscultation, no wheezes, rales or rhonchi, symmetric air entry Heart -3/6 systolic ejection murmur heard best at the left upper sternal border, regular rate and rhythm Extremities -no lower extremity edema      Latest Ref Rng & Units 11/26/2019   10:51 AM 12/25/2018   10:45 AM 04/19/2017    3:47 PM  CMP  Glucose 65 - 99 mg/dL 99  94   120   BUN 8 - 27 mg/dL _0 Creatinine 0.57 - 1.00 mg/dL 0.86  0.79  0.79   Sodium 134 - 144 mmol/L 140  138  143   Potassium 3.5 - 5.2 mmol/L 4.6  4.2  4.3   Chloride 96 - 106 mmol/L 104  102  102   CO2 20 - 29 mmol/L _1 Calcium 8.7 - 10.3 mg/dL 10.2  9.6  10.4   Total Protein 6.0 - 8.5 g/dL 6.8  7.0  6.6   Total Bilirubin 0.0 - 1.2 mg/dL 0.2  0.2  <0.2   Alkaline Phos 44 - 121 IU/L 79  90  71   AST 0 - 40 IU/L _2 ALT 0 - 32 IU/L _3 Lipid Panel     Component Value Date/Time   CHOL 219 (H) 11/26/2019 1051   TRIG 113 11/26/2019 1051   HDL 59 11/26/2019 1051   CHOLHDL 3.7 11/26/2019 1051   CHOLHDL 4.0 03/17/2014 0922   VLDL 41 (H) 03/17/2014 0922   LDLCALC 140 (H) 11/26/2019 1051    CBC    Component Value Date/Time   WBC 6.2 04/30/2019 1622   WBC 7.0 05/07/2017 1422   RBC 4.20 04/30/2019 1622   RBC 4.03 05/07/2017 1422   HGB 10.1 (L) 04/30/2019 1622   HCT 33.5 (L) 04/30/2019 1622   PLT 317 04/30/2019 1622   MCV 80 04/30/2019 1622   MCH 24.0 (L) 04/30/2019 1622   MCH 30.5 05/07/2017 1422   MCHC 30.1 (L) 04/30/2019 1622   MCHC 34.4 05/07/2017 1422   RDW 15.1 04/30/2019 1622   LYMPHSABS 3,171 05/07/2017 1422   MONOABS 448 09/29/2015 1158   EOSABS 273 05/07/2017 1422   BASOSABS 42 05/07/2017 1422   Results for orders placed or performed in visit on 06/24/21  POCT glucose (manual entry)  Result Value Ref Range   POC Glucose 144 (A) 70 - 99 mg/dl     ASSESSMENT AND PLAN: 1. Essential hypertension At goal.  Continue lisinopril/HCTZ and amlodipine.  Continue low-salt diet. - CBC - Comprehensive metabolic panel - lisinopril-hydrochlorothiazide (ZESTORETIC) 20-25 MG tablet; Take 1 tablet by mouth daily.  Dispense: 90 tablet; Refill: 2 - amLODipine (NORVASC) 10 MG tablet; Take 1 tablet (10 mg total) by mouth daily.  Dispense: 90 tablet; Refill: 1  2. Stage 1 mild COPD by GOLD classification ([place]) Stable.  Continue Trelegy  inhaler daily and albuterol inhaler and nebulizer as needed. On next visit we will walk her and check O2 to see whether she still requires O2.  3. Tobacco dependence Strongly advised to quit smoking.  She plans to get into a smoking cessation class to help with this.  4. Prediabetes We will check A1c today.  Encouraged her to continue healthy eating habits and regular exercise. - POCT glucose (manual entry) - Hemoglobin A1c - metFORMIN (GLUCOPHAGE) 500 MG tablet; TAKE 1/2 (ONE-HALF) TABLET BY MOUTH ONCE DAILY WITH BREAKFAST  Dispense: 45 tablet; Refill: 1  5. Other iron deficiency anemia Recheck CBC today. - CBC  6. Hyperlipidemia, unspecified hyperlipidemia type Continue Mevacor. - Lipid panel - lovastatin (MEVACOR) 40 MG tablet; Take 1 tablet (40 mg total) by mouth at bedtime.  Dispense: 90 tablet; Refill: 2  7. Overweight (BMI 25.0-29.9) #4 above.     Patient was given the opportunity to ask questions.  Patient verbalized understanding of the plan and was able to repeat key elements of the plan.   This documentation was completed using Radio producer.  Any transcriptional errors are unintentional.  Orders Placed This Encounter  Procedures   POCT glucose (manual entry)     Requested Prescriptions    No prescriptions requested or ordered in this encounter    No follow-ups on file.  Karle Plumber, MD, FACP

## 2021-06-25 LAB — COMPREHENSIVE METABOLIC PANEL
ALT: 13 IU/L (ref 0–32)
AST: 16 IU/L (ref 0–40)
Albumin/Globulin Ratio: 1.8 (ref 1.2–2.2)
Albumin: 4.4 g/dL (ref 3.8–4.8)
Alkaline Phosphatase: 82 IU/L (ref 44–121)
BUN/Creatinine Ratio: 18 (ref 12–28)
BUN: 17 mg/dL (ref 8–27)
Bilirubin Total: <0.2 mg/dL (ref 0.0–1.2)
CO2: 21 mmol/L (ref 20–29)
Calcium: 10.2 mg/dL (ref 8.7–10.3)
Chloride: 104 mmol/L (ref 96–106)
Creatinine, Ser: 0.95 mg/dL (ref 0.57–1.00)
Globulin, Total: 2.5 g/dL (ref 1.5–4.5)
Glucose: 123 mg/dL — ABNORMAL HIGH (ref 70–99)
Potassium: 4.3 mmol/L (ref 3.5–5.2)
Sodium: 141 mmol/L (ref 134–144)
Total Protein: 6.9 g/dL (ref 6.0–8.5)
eGFR: 66 mL/min/{1.73_m2} (ref >59)

## 2021-06-25 LAB — CBC
Hematocrit: 30.7 % — ABNORMAL LOW (ref 34.0–46.6)
Hemoglobin: 9.1 g/dL — ABNORMAL LOW (ref 11.1–15.9)
MCH: 21.3 pg — ABNORMAL LOW (ref 26.6–33.0)
MCHC: 29.6 g/dL — ABNORMAL LOW (ref 31.5–35.7)
MCV: 72 fL — ABNORMAL LOW (ref 79–97)
Platelets: 342 10*3/uL (ref 150–450)
RBC: 4.28 x10E6/uL (ref 3.77–5.28)
RDW: 16.5 % — ABNORMAL HIGH (ref 11.7–15.4)
WBC: 6.2 10*3/uL (ref 3.4–10.8)

## 2021-06-25 LAB — LIPID PANEL
Chol/HDL Ratio: 3.2 ratio (ref 0.0–4.4)
Cholesterol, Total: 167 mg/dL (ref 100–199)
HDL: 52 mg/dL (ref >39)
LDL Chol Calc (NIH): 95 mg/dL (ref 0–99)
Triglycerides: 110 mg/dL (ref 0–149)
VLDL Cholesterol Cal: 20 mg/dL (ref 5–40)

## 2021-06-25 LAB — HEMOGLOBIN A1C
Est. average glucose Bld gHb Est-mCnc: 120 mg/dL
Hgb A1c MFr Bld: 5.8 % — ABNORMAL HIGH (ref 4.8–5.6)

## 2021-06-25 NOTE — Progress Notes (Signed)
Let pt know that her anemia has worsen.  I recommend that she restart the iron supplement.  I will send a prescription to her pharmacy.  If not covered by her insurance, it can be purchased over the counter.  Kidney and liver functions are good.  Still in range for prediabetes. Cholesterol levels are good.

## 2021-06-30 ENCOUNTER — Telehealth: Payer: Self-pay

## 2021-06-30 DIAGNOSIS — B353 Tinea pedis: Secondary | ICD-10-CM

## 2021-06-30 MED ORDER — CLOTRIMAZOLE-BETAMETHASONE 1-0.05 % EX CREA: TOPICAL_CREAM | CUTANEOUS | 0 refills | Status: DC

## 2021-06-30 NOTE — Telephone Encounter (Signed)
Patient is requesting refill on clotrimazole-betamethasone

## 2021-07-04 NOTE — Telephone Encounter (Signed)
VM left informing patient that medication has been refilled. 

## 2021-07-05 ENCOUNTER — Other Ambulatory Visit: Payer: Self-pay

## 2021-07-05 ENCOUNTER — Ambulatory Visit: Payer: Self-pay

## 2021-07-05 MED ORDER — IRON (FERROUS SULFATE) 325 (65 FE) MG PO TABS: 325.0000 mg | ORAL_TABLET | Freq: Two times a day (BID) | ORAL | 1 refills | Status: DC

## 2021-07-05 NOTE — Telephone Encounter (Signed)
I have sent prescription for ferrous sulfate to her pharmacy.

## 2021-07-05 NOTE — Telephone Encounter (Signed)
mmary: pt not getting feedback on iron supplement she was to take   Pls FU with pt, Pt had an appt with dr last week and when results of blood panel came back very low iron, Pt states that she was told to dr would be starting her back on iron supplements and pt thought script would be called in, so far has not and if a nurse can tell her how much to purchase over counter or send script. She states she just wants someone to tell her what do as not getting any feed back on what to do. Pls fu at [phone] pt is very frustrated.       Chief Complaint: wants rx called in Symptoms: no Frequency: na Pertinent Negatives: Patient denies na Disposition: '[]'$ ED /'[]'$ Urgent Care (no appt availability in office) / '[]'$ Appointment(In office/virtual)/ '[]'$  [place] [facility]/ '[]'$ Home Care/ '[]'$ Refused Recommended Disposition /'[]'$ [place] [facility]/ '[x]'$  Follow-up with PCP Additional Notes:  Patient was given lab  results and wanted iron prescription called in to [place]. She used to take an OTC but no one has ever told her a dose and she would rather have a rx called in. Lab result notes states that Dr. Wynetta Emery can call in rx if pt wants.  Answer Assessment - Initial Assessment Questions 1. NAME of MEDICATION: "What medicine are you calling about?"     Iron supplement 2. QUESTION: "What is your question?" (e.g., double dose of medicine, side effect)     Want rx called in 3. PRESCRIBING HCP: "Who prescribed it?" Reason: if prescribed by specialist, call should be referred to that group.     Dr Wynetta Emery said she would 4. SYMPTOMS: "Do you have any symptoms?"     no 5. SEVERITY: If symptoms are present, ask "Are they mild, moderate or severe?"     na 6. PREGNANCY:  "Is there any chance that you are pregnant?" "When was your last menstrual period?"     no  Protocols used: Medication Question Call-A-AH

## 2021-07-05 NOTE — Telephone Encounter (Signed)
Pls FU with pt, Pt had an appt with dr last week and when results of blood panel came back very low iron, Pt states that she was told to dr would be starting her back on iron supplements and pt thought script would be called in, so far has not and if a nurse can tell her how much to purchase over counter or send script. She states she just wants someone to tell her what do as not getting any feed back on what to do. Pls fu at [phone] pt is very frustrated.   Left message to call back.

## 2021-07-13 DIAGNOSIS — J449 Chronic obstructive pulmonary disease, unspecified: Secondary | ICD-10-CM | POA: Diagnosis not present

## 2021-07-20 ENCOUNTER — Encounter: Payer: Self-pay | Admitting: Physician Assistant

## 2021-07-20 ENCOUNTER — Ambulatory Visit: Payer: Self-pay | Admitting: *Deleted

## 2021-07-20 ENCOUNTER — Telehealth: Payer: Self-pay | Admitting: Internal Medicine

## 2021-07-20 ENCOUNTER — Ambulatory Visit: Payer: Medicare HMO | Admitting: Physician Assistant

## 2021-07-20 VITALS — BP 179/92 | HR 73 | Resp 18 | Ht 67.0 in | Wt 185.0 lb

## 2021-07-20 DIAGNOSIS — B351 Tinea unguium: Secondary | ICD-10-CM | POA: Diagnosis not present

## 2021-07-20 DIAGNOSIS — L600 Ingrowing nail: Secondary | ICD-10-CM

## 2021-07-20 DIAGNOSIS — I10 Essential (primary) hypertension: Secondary | ICD-10-CM

## 2021-07-20 NOTE — Patient Instructions (Addendum)
I have started a referral for you to be seen by podiatry to help you with your ingrown toenails.  Your blood pressure is elevated today, it is important that you take your blood pressure medication on a daily basis, check your blood pressure on a daily basis as well, keep a written log and have that available for all office visits.  I hope that you feel better soon, please let us know if there is anything else we can do for you.  Kennieth Rad, PA-C Physician Assistant [facility] [email or web address]   Ingrown Toenail  An ingrown toenail occurs when the corner or sides of a toenail grow into the surrounding skin. This causes discomfort and pain. The big toe is most commonly affected, but any of the toes can be affected. If an ingrown toenail is not treated, it can become infected. What are the causes? This condition may be caused by: Wearing shoes that are too small or tight. An injury, such as stubbing your toe or having your toe stepped on. Improper cutting or care of your toenails. Having nail or foot abnormalities that were present from birth (congenital abnormalities), such as having a nail that is too big for your toe. What increases the risk? The following factors may make you more likely to develop ingrown toenails: Age. Nails tend to get thicker with age, so ingrown nails are more common among older people. Cutting your toenails incorrectly, such as cutting them very short or cutting them unevenly. An ingrown toenail is more likely to get infected if you have: Diabetes. Blood flow (circulation) problems. What are the signs or symptoms? Symptoms of an ingrown toenail may include: Pain, soreness, or tenderness. Redness. Swelling. Hardening of the skin that surrounds the toenail. Signs that an ingrown toenail may be infected include: Fluid or pus. Symptoms that get worse. How is this diagnosed? Ingrown toenails may be  diagnosed based on: Your symptoms and medical history. A physical exam. Labs or tests. If you have fluid or blood coming from your toenail, a sample may be collected to test for the specific type of bacteria that is causing the infection. How is this treated? Treatment depends on the severity of your symptoms. You may be able to care for your toenail at home. If you have an infection, you may be prescribed antibiotic medicines. If you have fluid or pus draining from your toenail, your health care provider may drain it. If you have trouble walking, you may be given crutches to use. If you have a severe or infected ingrown toenail, you may need a procedure to remove part or all of the nail. Follow these instructions at home: [place] your wound every day for signs of infection, or as often as told by your health care provider. Check for: More redness, swelling, or pain. More fluid or blood. Warmth. Pus or a bad smell. Do not pick at your toenail or try to remove it yourself. Soak your foot in warm, soapy water. Do this for 20 minutes, 3 times a day, or as often as told by your health care provider. This helps to keep your toe clean and your skin soft. Wear shoes that fit well and are not too tight. Your health care provider may recommend that you wear open-toed shoes while you heal. Trim your toenails regularly and carefully. Cut your toenails straight across to prevent injury to the skin at the corners of the toenail. Do not cut your nails in  a curved shape. Keep your feet clean and dry to help prevent infection. General instructions Take over-the-counter and prescription medicines only as told by your health care provider. If you were prescribed an antibiotic, take it as told by your health care provider. Do not stop taking the antibiotic even if you start to feel better. If your health care provider told you to use crutches to help you move around, use them as instructed. Return  to your normal activities as told by your health care provider. Ask your health care provider what activities are safe for you. Keep all follow-up visits. This is important. Contact a health care provider if: You have more redness, swelling, pain, or other symptoms that do not improve with treatment. You have fluid, blood, or pus coming from your toenail. You have a red streak on your skin that starts at your foot and spreads up your leg. You have a fever. Summary An ingrown toenail occurs when the corner or sides of a toenail grow into the surrounding skin. This causes discomfort and pain. The big toe is most commonly affected, but any of the toes can be affected. If an ingrown toenail is not treated, it can become infected. Fluid or pus draining from your toenail is a sign of infection. Your health care provider may need to drain it. You may be given antibiotics to treat the infection. Trimming your toenails regularly and properly can help you prevent an ingrown toenail. This information is not intended to replace advice given to you by your health care provider. Make sure you discuss any questions you have with your health care provider. Document Revised: 05/04/2020 Document Reviewed: 05/04/2020 Elsevier Patient Education  [place].  How to Take Your Blood Pressure Blood pressure is a measurement of how strongly your blood is pressing against the walls of your arteries. Arteries are blood vessels that carry blood from your heart throughout your body. Your health care provider takes your blood pressure at each office visit. You can also take your own blood pressure at home with a blood pressure monitor. You may need to take your own blood pressure to: Confirm a diagnosis of high blood pressure (hypertension). Monitor your blood pressure over time. Make sure your blood pressure medicine is working. Supplies needed: Blood pressure monitor. A chair to sit in. This should be a chair  where you can sit upright with your back supported. Do not sit on a soft couch or an armchair. Table or desk. Small notebook and pencil or pen. How to prepare To get the most accurate reading, avoid the following for 30 minutes before you check your blood pressure: Drinking caffeine. Drinking alcohol. Eating. Smoking. Exercising. Five minutes before you check your blood pressure: Use the bathroom and urinate so that you have an empty bladder. Sit quietly in a chair. Do not talk. How to take your blood pressure To check your blood pressure, follow the instructions in the manual that came with your blood pressure monitor. If you have a digital blood pressure monitor, the instructions may be as follows: Sit up straight in a chair. Place your feet on the floor. Do not cross your ankles or legs. Rest your left arm at the level of your heart on a table or desk or on the arm of a chair. Pull up your shirt sleeve. Wrap the blood pressure cuff around the upper part of your left arm, 1 inch (2.5 cm) above your elbow. It is best to wrap the  cuff around bare skin. Fit the cuff snugly, but not too tightly, around your arm. You should be able to place only one finger between the cuff and your arm. Position the cord so that it rests in the bend of your elbow. Press the power button. Sit quietly while the cuff inflates and deflates. Read the digital reading on the monitor screen and write the numbers down (record them) in a notebook. Wait 2-3 minutes, then repeat the steps, starting at step 1. What does my blood pressure reading mean? A blood pressure reading consists of a higher number over a lower number. Ideally, your blood pressure should be below 120/80. The first ("top") number is called the systolic pressure. It is a measure of the pressure in your arteries as your heart beats. The second ("bottom") number is called the diastolic pressure. It is a measure of the pressure in your arteries as the  heart relaxes. Blood pressure is classified into four stages. The following are the stages for adults who do not have a short-term serious illness or a chronic condition. Systolic pressure and diastolic pressure are measured in a unit called mm Hg (millimeters of mercury).  Normal Systolic pressure: below 829. Diastolic pressure: below 80. Elevated Systolic pressure: 937-169. Diastolic pressure: below 80. Hypertension stage 1 Systolic pressure: 678-938. Diastolic pressure: 10-17. Hypertension stage 2 Systolic pressure: 510 or above. Diastolic pressure: 90 or above. You can have elevated blood pressure or hypertension even if only the systolic or only the diastolic number in your reading is higher than normal. Follow these instructions at home: Medicines Take over-the-counter and prescription medicines only as told by your health care provider. Tell your health care provider if you are having any side effects from blood pressure medicine. General instructions Check your blood pressure as often as recommended by your health care provider. Check your blood pressure at the same time every day. Take your monitor to the next appointment with your health care provider to make sure that: You are using it correctly. It provides accurate readings. Understand what your goal blood pressure numbers are. Keep all follow-up visits. This is important. General tips Your health care provider can suggest a reliable monitor that will meet your needs. There are several types of home blood pressure monitors. Choose a monitor that has an arm cuff. Do not choose a monitor that measures your blood pressure from your wrist or finger. Choose a cuff that wraps snugly, not too tight or too loose, around your upper arm. You should be able to fit only one finger between your arm and the cuff. You can buy a blood pressure monitor at most drugstores or online. Where to find more information American Heart  Association: www.heart.org Contact a health care provider if: Your blood pressure is consistently high. Your blood pressure is suddenly low. Get help right away if: Your systolic blood pressure is higher than 180. Your diastolic blood pressure is higher than 120. These symptoms may be an emergency. Get help right away. Call 911. Do not wait to see if the symptoms will go away. Do not drive yourself to the hospital. Summary Blood pressure is a measurement of how strongly your blood is pressing against the walls of your arteries. A blood pressure reading consists of a higher number over a lower number. Ideally, your blood pressure should be below 120/80. Check your blood pressure at the same time every day. Avoid caffeine, alcohol, smoking, and exercise for 30 minutes prior to checking your blood  pressure. These agents can affect the accuracy of the blood pressure reading. This information is not intended to replace advice given to you by your health care provider. Make sure you discuss any questions you have with your health care provider. Document Revised: 09/16/2020 Document Reviewed: 09/16/2020 Elsevier Patient Education  [place].

## 2021-07-20 NOTE — Telephone Encounter (Signed)
Referral Request - Has patient seen PCP for this complaint? Yes/ patient says she saw another Dr on [address]  [address] at [facility] and was told a referral would be done *If NO, is insurance requiring patient see PCP for this issue before PCP can refer them? Referral for which specialty: podiatrist Preferred provider/office: ? Reason for referral: toe pain

## 2021-07-20 NOTE — Progress Notes (Signed)
Established Patient Office Visit  Subjective   Patient ID: Alison Ford, female    DOB: [birth year]  Age: 66 y.o. MRN: [id number]  Chief Complaint  Patient presents with   Foot Pain    Top of left foot. Hurts when standing and walking    States that she is having swelling in her 4th digit on her left foot for the past 5 days. Endorses pain while walking and also while standing.  States pain is stabbing "bruise like".  Denies injury or trauma  States that she has tried biofreeze with a Rennaker relief.  States that she does check her blood pressure at home on occasion, endorses "normal readings" but is unable to give examples.  States that she did not take her blood pressure medication yet today.  Denies any hypertensive symptoms.   Past Medical History:  Diagnosis Date   Abnormal facial hair    Back pain    d/t knee pain   Headache(784.0)    Heart murmur    slight   HTN (hypertension)    takes Amlodipine daily and HCTZ    Hyperlipidemia    takes Lovastatin nightly   Joint pain    Right knee DJD    Weakness    left hand   Social History   Socioeconomic History   Marital status: Legally Separated    Spouse name: Not on file   Number of children: 3   Years of education: Not on file   Highest education level: Not on file  Occupational History    Employer: [employer]  Tobacco Use   Smoking status: Former    Packs/day: 0.50    Years: 15.00    Total pack years: 7.50    Types: Cigarettes    Quit date: 08/13/2020    Years since quitting: 0.9   Smokeless tobacco: Never   Tobacco comments:    Smoking 3-4 cigs/day, stopped smoking on 08/13/20  Substance and Sexual Activity   Alcohol use: Yes    Alcohol/week: 0.0 standard drinks of alcohol    Comment: ocassionally   Drug use: No   Sexual activity: Never  Other Topics Concern   Not on file  Social History Narrative   Lives with daughter and two grandsons.    Social Determinants of Health   Financial Resource  Strain: Not on file  Food Insecurity: Not on file  Transportation Needs: Not on file  Physical Activity: Not on file  Stress: Not on file  Social Connections: Not on file  Intimate Partner Violence: Not on file   Family History  Problem Relation Age of Onset   CAD Father 75   CAD Mother 22   Hypertension Mother    Allergies  Allergen Reactions   Penicillins Itching and Other (See Comments)    Burning and whelps Has patient had a PCN reaction causing immediate rash, facial/tongue/throat swelling, SOB or lightheadedness with hypotension: NO Has patient had a PCN reaction causing severe rash involving mucus membranes or skin necrosis: NO Has patient had a PCN reaction that required hospitalization: NO Has patient had a PCN reaction occurring within the last 10 years: NO If all of the above answers are "NO", then may proceed with Cephalosporin use.    Review of Systems  Constitutional:  Negative for chills and fever.  HENT: Negative.    Eyes: Negative.   Respiratory:  Negative for shortness of breath.   Cardiovascular:  Negative for chest pain.  Gastrointestinal: Negative.   Genitourinary:  Negative.   Musculoskeletal: Negative.   Skin: Negative.   Neurological: Negative.   Endo/Heme/Allergies: Negative.   Psychiatric/Behavioral: Negative.        Objective:     BP (!) 179/92 (BP Location: Left Arm, Patient Position: Sitting, Cuff Size: Normal)   Pulse 73   Resp 18   Ht '5\' 7"'$  (1.702 m)   Wt 185 lb (83.9 kg)   SpO2 97%   BMI 28.98 kg/m    Physical Exam Vitals and nursing note reviewed.  Constitutional:      Appearance: Normal appearance.  HENT:     Head: Normocephalic and atraumatic.     Right Ear: External ear normal.     Left Ear: External ear normal.     Nose: Nose normal.     Mouth/Throat:     Mouth: Mucous membranes are moist.     Pharynx: Oropharynx is clear.  Eyes:     Extraocular Movements: Extraocular movements intact.     Conjunctiva/sclera:  Conjunctivae normal.     Pupils: Pupils are equal, round, and reactive to light.  Cardiovascular:     Rate and Rhythm: Normal rate and regular rhythm.     Pulses: Normal pulses.     Heart sounds: Normal heart sounds.  Pulmonary:     Effort: Pulmonary effort is normal.     Breath sounds: Normal breath sounds.  Musculoskeletal:        General: Normal range of motion.     Cervical back: Normal range of motion and neck supple.  Feet:     Left foot:     Skin integrity: Skin integrity normal.     Toenail Condition: Left toenails are abnormally thick, long and ingrown. Fungal disease present.    Comments: See photos Skin:    General: Skin is warm and dry.     Findings: No erythema.  Neurological:     General: No focal deficit present.     Mental Status: She is alert and oriented to person, place, and time.  Psychiatric:        Mood and Affect: Mood normal.        Behavior: Behavior normal.        Thought Content: Thought content normal.        Judgment: Judgment normal.           Assessment & Plan:   Problem List Items Addressed This Visit   None Visit Diagnoses     Ingrown toenail without infection    -  Primary   Fungal infection of toenail       Elevated blood pressure reading in office with diagnosis of hypertension          1. Ingrown toenail without infection Patient education given on supportive care.  Refer to podiatry for further evaluation.  Red flags given for prompt reevaluation. - Ambulatory referral to Podiatry  2. Fungal infection of toenail  - Ambulatory referral to Podiatry  3. Elevated blood pressure reading in office with diagnosis of hypertension Patient encouraged to check blood pressure at home on a daily basis, keep a written log and have available for all office visits.  Patient encouraged to keep follow-up appointment with PCP next month.  Patient encouraged to return to mobile unit if needed.   I have reviewed the patient's medical  history (PMH, PSH, Social History, Family History, Medications, and allergies) , and have been updated if relevant. I spent 30 minutes reviewing chart and  face to face time  with patient.    Return if symptoms worsen or fail to improve.    Loraine Grip Mayers, PA-C

## 2021-07-20 NOTE — Telephone Encounter (Signed)
Seen for rash with [facility].  Please refer to podiatry if able.

## 2021-07-20 NOTE — Telephone Encounter (Signed)
The patient has experienced swelling in the toe next to their pinky toe on their left foot   The patient shares that there is no other swelling in their foot or leg   The patient would like to speak with a member of staff to discuss further when possible    Chief Complaint: Toe Pain Symptoms: 4th toe on left foot swollen and painful "Almost twice the size."Painful, cannot bear weight. 9/10 pain "Lightner darker than usual, not red." Frequency: 5 days Pertinent Negatives: Patient denies injury,redness, fever Disposition: '[]'$ ED /'[]'$ Urgent Care (no appt availability in office) / '[]'$ Appointment(In office/virtual)/ '[]'$  [facility]/ '[]'$ Home Care/ '[]'$ Refused Recommended Disposition /'[x]'$ [facility]/ '[]'$  Follow-up with PCP Additional Notes: [facility], states will follow disposition.    Reason for Disposition  [1] Swollen toe AND [2] no fever  (Exceptions: just localized bump from bunion, corns, insect bite, sting)  Answer Assessment - Initial Assessment Questions 1. ONSET: "When did the pain start?"       5 days 2. LOCATION: "Where is the pain located?"   (e.g., around nail, entire toe, at foot joint)      Left foot, toe next to pinky toe 3. PAIN: "How bad is the pain?"    (Scale 1-10; or mild, moderate, severe)   -  MILD (1-3): doesn't interfere with normal activities    -  MODERATE (4-7): interferes with normal activities (e.g., work or school) or awakens from sleep, limping    -  SEVERE (8-10): excruciating pain, unable to do any normal activities, unable to walk     Hurts when walking. 9/10. Can't bear weight. 4. APPEARANCE: "What does the toe look like?" (e.g., redness, swelling, bruising, pallor)     Swelling, almost twice the size. "[place] dark" not red 5. CAUSE: "What do you think is causing the toe pain?"     Unsure 6. OTHER SYMPTOMS: "Do you have any other symptoms?" (e.g., leg pain, rash, fever, numbness)     None  Protocols used: Toe Pain-A-AH

## 2021-07-20 NOTE — Telephone Encounter (Signed)
Patient also seen on Mobile Unit today by Provider Mayers, PA-C

## 2021-07-20 NOTE — Telephone Encounter (Signed)
Patient was seen on MU this morning.

## 2021-07-28 ENCOUNTER — Other Ambulatory Visit: Payer: Self-pay | Admitting: Physician Assistant

## 2021-07-28 DIAGNOSIS — D509 Iron deficiency anemia, unspecified: Secondary | ICD-10-CM

## 2021-07-28 DIAGNOSIS — Z86010 Personal history of colonic polyps: Secondary | ICD-10-CM | POA: Diagnosis not present

## 2021-07-28 DIAGNOSIS — R1030 Lower abdominal pain, unspecified: Secondary | ICD-10-CM

## 2021-07-28 DIAGNOSIS — K5900 Constipation, unspecified: Secondary | ICD-10-CM | POA: Diagnosis not present

## 2021-08-02 ENCOUNTER — Telehealth: Payer: Self-pay | Admitting: *Deleted

## 2021-08-02 NOTE — Telephone Encounter (Signed)
Copied from [place]. Topic: Referral - Request for Referral >> Jul 20, 2021  9:36 AM Everette C wrote: Has patient seen PCP for this complaint? No. *If NO, is insurance requiring patient see PCP for this issue before PCP can refer them? Referral for which specialty: Podiatry  Preferred provider/office: Patient has no preference  Reason for referral: 4th toe on left foot concern

## 2021-08-03 ENCOUNTER — Ambulatory Visit (INDEPENDENT_AMBULATORY_CARE_PROVIDER_SITE_OTHER): Payer: Medicare HMO | Admitting: Podiatry

## 2021-08-03 DIAGNOSIS — M79675 Pain in left toe(s): Secondary | ICD-10-CM | POA: Diagnosis not present

## 2021-08-03 DIAGNOSIS — B351 Tinea unguium: Secondary | ICD-10-CM

## 2021-08-03 DIAGNOSIS — M79674 Pain in right toe(s): Secondary | ICD-10-CM

## 2021-08-03 NOTE — Progress Notes (Signed)
   Chief Complaint  Patient presents with   Ingrown Toenail    Possible bilateral ingrown toenails. Patient is a a pre-diabetic with thick long toenails. Some discoloration, patient states her toenails are not painful. Last A1c was 5.8 taken 1 month ago.    SUBJECTIVE Patient presents to office today complaining of elongated, thickened nails that cause pain while ambulating in shoes.  Patient is unable to trim their own nails. Patient is here for further evaluation and treatment.  Past Medical History:  Diagnosis Date   Abnormal facial hair    Back pain    d/t knee pain   Headache(784.0)    Heart murmur    slight   HTN (hypertension)    takes Amlodipine daily and HCTZ    Hyperlipidemia    takes Lovastatin nightly   Joint pain    Right knee DJD    Weakness    left hand    OBJECTIVE General Patient is awake, alert, and oriented x 3 and in no acute distress. Derm Skin is dry and supple bilateral. Negative open lesions or macerations. Remaining integument unremarkable. Nails are tender, long, thickened and dystrophic with subungual debris, consistent with onychomycosis, 1-5 bilateral. No signs of infection noted. Vasc  DP and PT pedal pulses palpable bilaterally. Temperature gradient within normal limits.  Neuro Epicritic and protective threshold sensation grossly intact bilaterally.  Musculoskeletal Exam No symptomatic pedal deformities noted bilateral. Muscular strength within normal limits.  ASSESSMENT 1.  Pain due to onychomycosis of toenails both  PLAN OF CARE 1. Patient evaluated today.  2. Instructed to maintain good pedal hygiene and foot care.  3. Mechanical debridement of nails 1-5 bilaterally performed using a nail nipper. Filed with dremel without incident.  4. Return to clinic in 3 mos.    Edrick Kins, DPM [facility]  Dr. Edrick Kins, DPM    [address], [place] [address]                 Office [phone]  Fax [phone]

## 2021-08-04 ENCOUNTER — Ambulatory Visit: Payer: Medicare HMO | Admitting: Pharmacist

## 2021-08-05 ENCOUNTER — Telehealth: Payer: Self-pay | Admitting: Internal Medicine

## 2021-08-05 ENCOUNTER — Ambulatory Visit: Payer: Medicare HMO | Attending: Internal Medicine | Admitting: Pharmacist

## 2021-08-05 ENCOUNTER — Encounter: Payer: Self-pay | Admitting: Pharmacist

## 2021-08-05 VITALS — Ht 67.0 in | Wt 179.4 lb

## 2021-08-05 DIAGNOSIS — Z0000 Encounter for general adult medical examination without abnormal findings: Secondary | ICD-10-CM

## 2021-08-05 DIAGNOSIS — Z780 Asymptomatic menopausal state: Secondary | ICD-10-CM

## 2021-08-05 DIAGNOSIS — Z23 Encounter for immunization: Secondary | ICD-10-CM

## 2021-08-05 MED ORDER — PNEUMOCOCCAL 20-VAL CONJ VACC 0.5 ML IM SUSY: 0.5000 mL | PREFILLED_SYRINGE | Freq: Once | INTRAMUSCULAR | 0 refills | Status: AC

## 2021-08-05 NOTE — Telephone Encounter (Signed)
-----   Message from Tresa Endo, [place] sent at 08/05/2021  3:03 PM EDT ----- Hey Dr. Wynetta Emery,   Saw this patient.   Updated her HM. Sent Prevnar 20 to her rx per request.   Of note, she has yet to see Dr. Katy Fitch this year and it's showing up under her HM. Encouraged her to make an appt.   She requests order for her DEXA scan. Other than that, she was good to go.   Thank you for including [place].

## 2021-08-05 NOTE — Progress Notes (Signed)
Subjective:   Alison Ford is a 66 y.o. female who presents for Medicare Annual (Subsequent) preventive examination.     Objective:    Today's Vitals   08/05/21 1439 08/05/21 1440  Weight: 179 lb 6.4 oz (81.4 kg)   Height: 5' 7"  (1.702 m)   PainSc:  6    Body mass index is 28.1 kg/m.     08/05/2021    2:46 PM 05/06/2021   12:29 AM 12/26/2019    3:21 PM 12/25/2019   10:27 AM 01/28/2019   12:27 PM 05/30/2017    8:26 AM 12/13/2016    2:13 PM  Advanced Directives  Does Patient Have a Medical Advance Directive? No No No No No No No  Would patient like information on creating a medical advance directive? No - Patient declined  No - Patient declined No - Patient declined No - Patient declined No - Patient declined No - Patient declined    Current Medications (verified) Outpatient Encounter Medications as of 08/05/2021  Medication Sig   albuterol (PROVENTIL) (2.5 MG/3ML) 0.083% nebulizer solution Take 3 mLs (2.5 mg total) by nebulization every 6 (six) hours as needed for wheezing or shortness of breath.   albuterol (VENTOLIN HFA) 108 (90 Base) MCG/ACT inhaler INHALE 2 PUFFS BY MOUTH 4 TIMES DAILY AS NEEDED   amLODipine (NORVASC) 10 MG tablet Take 1 tablet (10 mg total) by mouth daily.   aspirin EC 81 MG tablet Take 1 tablet (81 mg total) by mouth daily.   Blood Glucose Monitoring Suppl (TRUE METRIX METER) w/Device KIT Use as directed   cetirizine (ZYRTEC) 10 MG tablet Take 1 tablet (10 mg total) by mouth daily.   glucose blood (TRUE METRIX BLOOD GLUCOSE TEST) test strip Use to check blood sugar once daily.   Iron, Ferrous Sulfate, 325 (65 Fe) MG TABS Take 325 mg by mouth in the morning and at bedtime.   LINZESS 145 MCG CAPS capsule TAKE 1 CAPSULE EVERY DAY BEFORE BREAKFAST   lisinopril-hydrochlorothiazide (ZESTORETIC) 20-25 MG tablet Take 1 tablet by mouth daily.   lovastatin (MEVACOR) 40 MG tablet Take 1 tablet (40 mg total) by mouth at bedtime.   metFORMIN (GLUCOPHAGE) 500 MG  tablet TAKE 1/2 (ONE-HALF) TABLET BY MOUTH ONCE DAILY WITH BREAKFAST   nicotine polacrilex (NICORETTE) 2 MG gum Take 1 each (2 mg total) by mouth as needed for smoking cessation.   pneumococcal 20-valent conjugate vaccine (PREVNAR 20) 0.5 ML injection Inject 0.5 mLs into the muscle once for 1 dose.   TRELEGY ELLIPTA 100-62.5-25 MCG/INH AEPB INHALE 1 PUFF INTO THE LUNGS EVERY MORNING.   TRUEplus Lancets 28G MISC Use as directed   clotrimazole-betamethasone (LOTRISONE) cream APPLY CREAM TOPICALLY TO AFFECTED AREA TWICE DAILY   cromolyn (OPTICROM) 4 % ophthalmic solution Place 1 drop into both eyes 4 (four) times daily. (Patient not taking: Reported on 07/20/2021)   oxyCODONE-acetaminophen (PERCOCET) 5-325 MG tablet Take 1-2 tablets by mouth every 4 (four) hours as needed. (Patient not taking: Reported on 07/20/2021)   pantoprazole (PROTONIX) 40 MG tablet Take 1 tablet (40 mg total) by mouth daily.   No facility-administered encounter medications on file as of 08/05/2021.    Allergies (verified) Penicillins   History: Past Medical History:  Diagnosis Date   Abnormal facial hair    Back pain    d/t knee pain   Headache(784.0)    Heart murmur    slight   HTN (hypertension)    takes Amlodipine daily and HCTZ  Hyperlipidemia    takes Lovastatin nightly   Joint pain    Right knee DJD    Weakness    left hand   Past Surgical History:  Procedure Laterality Date   ABDOMINAL HYSTERECTOMY     Athroscopic knee surgery     Bilateral   CARPAL TUNNEL RELEASE Right 05/02/2016   Procedure: CARPAL TUNNEL RELEASE;  Surgeon: Meredith Pel, MD;  Location: [place];  Service: Orthopedics;  Laterality: Right;   CHOLECYSTECTOMY     DILATION AND CURETTAGE OF UTERUS     EYE SURGERY Right    SHOULDER ARTHROSCOPY WITH SUBACROMIAL DECOMPRESSION Right 05/02/2016   Procedure: RIGHT SHOULDER ARTHROSCOPY WITH SUBACROMIAL DECOMPRESSION, MINI OPEN ROTATOR CUFF TEAR REPAIR, AND RIGHT CARPAL TUNNEL RELEASE;   Surgeon: Meredith Pel, MD;  Location: [place];  Service: Orthopedics;  Laterality: Right;   TOTAL KNEE ARTHROPLASTY Right 07/15/2012   Procedure: RIGHT TOTAL KNEE ARTHROPLASTY;  Surgeon: Lorn Junes, MD;  Location: [facility];  Service: Orthopedics;  Laterality: Right;   Family History  Problem Relation Age of Onset   CAD Father 80   CAD Mother 64   Hypertension Mother    Social History   Socioeconomic History   Marital status: Legally Separated    Spouse name: Not on file   Number of children: 3   Years of education: Not on file   Highest education level: Not on file  Occupational History    Employer: [id number]  Tobacco Use   Smoking status: Every Day    Packs/day: 0.25    Types: Cigarettes    Last attempt to quit: 08/13/2020    Years since quitting: 0.9   Smokeless tobacco: Never   Tobacco comments:    Started smoking  Substance and Sexual Activity   Alcohol use: Yes    Alcohol/week: 0.0 standard drinks of alcohol    Comment: ocassionally   Drug use: No   Sexual activity: Never  Other Topics Concern   Not on file  Social History Narrative   Lives by herself now.    Social Determinants of Health   Financial Resource Strain: Not on file  Food Insecurity: Not on file  Transportation Needs: Not on file  Physical Activity: Not on file  Stress: Not on file  Social Connections: Not on file    Tobacco Counseling Ready to quit: Not Answered Counseling given: Not Answered Tobacco comments: Started smoking   Clinical Intake:  Pre-visit preparation completed: No  Pain : 0-10 Pain Score: 6  Pain Type: Chronic pain (Manages with Tylenol) Pain Location: Shoulder Pain Orientation: Right, Left Pain Descriptors / Indicators: Constant Pain Relieving Factors: Tylenol  Pain Relieving Factors: Tylenol  Nutritional Risks: None Diabetes: No  How often do you need to have someone help you when you read instructions, pamphlets, or other written materials from your  doctor or pharmacy?: 1 - Never What is the last grade level you completed in school?: 11th  Diabetic? No - preDM  Interpreter Needed?: No  Information entered by :: [place]   Activities of Daily Living    08/05/2021    2:48 PM  In your present state of health, do you have any difficulty performing the following activities:  Hearing? 0  Vision? 0  Comment Dr. Katy Fitch  Difficulty concentrating or making decisions? 0  Walking or climbing stairs? 0  Dressing or bathing? 0  Doing errands, shopping? 0  Preparing Food and eating ? N  Using the Toilet? N  In  the past six months, have you accidently leaked urine? N  Do you have problems with loss of bowel control? N  Managing your Medications? N  Managing your Finances? N  Housekeeping or managing your Housekeeping? N    Patient Care Team: Ladell Pier, MD as PCP - General (Internal Medicine)  Indicate any recent Medical Services you may have received from other than [facility] providers in the past year (date may be approximate).     Assessment:   This is a routine wellness examination for [place].  Hearing/Vision screen No results found.  Dietary issues and exercise activities discussed: Current Exercise Habits: Home exercise routine, Time (Minutes): 10, Frequency (Times/Week): 7, Weekly Exercise (Minutes/Week): 70, Intensity: Mild   Goals Addressed   None   Depression Screen    08/05/2021    2:47 PM 07/20/2021   12:21 PM 06/24/2021    4:25 PM 12/26/2019    3:17 PM 05/15/2019   11:28 AM 04/29/2019    1:28 PM 04/01/2018    9:15 AM  PHQ 2/9 Scores  PHQ - 2 Score 0 0 0 1 1 0 0  PHQ- 9 Score 1 0     2    Fall Risk    08/05/2021    2:47 PM 07/20/2021   12:15 PM 06/24/2021    4:25 PM 12/26/2019    3:17 PM 11/21/2019   11:13 AM  [place] in the past year? 0 1 0 0 0  Number falls in past yr: 0 0 0 0   Injury with Fall? 0 0 0 0   Risk for fall due to :   No Fall Risks    Follow up Falls evaluation completed;Education  provided;Falls prevention discussed        FALL RISK PREVENTION PERTAINING TO THE HOME:  Any stairs in or around the home? No  If so, are there any without handrails? No  Home free of loose throw rugs in walkways, pet beds, electrical cords, etc? Yes  Adequate lighting in your home to reduce risk of falls? Yes   ASSISTIVE DEVICES UTILIZED TO PREVENT FALLS:  Life alert? No  Use of a cane, walker or w/c? No  Grab bars in the bathroom? No  Shower chair or bench in shower? No  Elevated toilet seat or a handicapped toilet? No   TIMED UP AND GO:  Was the test performed? Yes .  Length of time to ambulate 10 feet: 6 sec.   Gait steady and fast without use of assistive device  Cognitive Function:    08/05/2021    2:50 PM 12/26/2019    3:18 PM  MMSE - Mini Mental State Exam  Orientation to time 5 5  Orientation to Place 5 5  Registration 3 3  Attention/ Calculation 5 5  Recall 0 3  Language- name 2 objects 2 2  Language- repeat 1 1  Language- follow 3 step command 3 3  Language- read & follow direction 1 1  Write a sentence 1 1  Copy design 1 0  Total score 27 29        Immunizations Immunization History  Administered Date(s) Administered   Influenza, Quadrivalent, Recombinant, Inj, Pf 10/22/2018   Influenza,inj,Quad PF,6+ Mos 02/11/2014, 02/11/2015, 09/29/2015   Influenza-Unspecified 11/04/2019   PFIZER(Purple Top)SARS-COV-2 Vaccination 04/23/2019, 05/14/2019, 03/09/2020   Zoster Recombinat (Shingrix) 11/04/2019, 02/18/2020    TDAP status: Up to date  Flu Vaccine status: Up to date  Pneumococcal vaccine status: Completed  during today's visit. - Sent Prevnar 20 to her rx  Covid-19 vaccine status: Completed vaccines  Qualifies for Shingles Vaccine? No  - has already received   Screening Tests Health Maintenance  Topic Date Due   Pneumonia Vaccine 37+ Years old (1 - PCV) Never done   DEXA SCAN  Never done   COVID-19 Vaccine (4 - Booster for Pfizer series)  05/04/2020   OPHTHALMOLOGY EXAM  10/08/2020   INFLUENZA VACCINE  08/16/2021   TETANUS/TDAP  01/16/2022   MAMMOGRAM  12/17/2022   COLONOSCOPY (Pts 45-9yr Insurance coverage will need to be confirmed)  12/01/2025   Hepatitis C Screening  Completed   Zoster Vaccines- Shingrix  Completed   HPV VACCINES  Aged Out    Health Maintenance  Health Maintenance Due  Topic Date Due   Pneumonia Vaccine 66 Years old (1 - PCV) Never done   DEXA SCAN  Never done   COVID-19 Vaccine (4 - Booster for POnstedseries) 05/04/2020   OPHTHALMOLOGY EXAM  10/08/2020    Colorectal cancer screening: Type of screening: Colonoscopy. Completed 11/2015. Repeat every 10 years  Mammogram status: Completed 12/2020. Repeat every year  Bone Density status: Ordered  . Pt provided with contact info and advised to call to schedule appt.  Lung Cancer Screening: (Low Dose CT Chest recommended if Age 66-80years, 30 pack-year currently smoking OR have quit w/in 15years.) does qualify.   Lung Cancer Screening Referral: submitted  Additional Screening:  Hepatitis C Screening: does not qualify; Already completed 05/30/2016  Vision Screening: Recommended annual ophthalmology exams for early detection of glaucoma and other disorders of the eye. Is the patient up to date with their annual eye exam?  No  Who is the provider or what is the name of the office in which the patient attends annual eye exams? Dr. GKaty Fitch- encouraged her to make an appt.    Dental Screening: Recommended annual dental exams for proper oral hygiene  Community Resource Referral / Chronic Care Management: CRR required this visit?  No   CCM required this visit?  No      Plan:     I have personally reviewed and noted the following in the patient's chart:   Medical and social history Use of alcohol, tobacco or illicit drugs  Current medications and supplements including opioid prescriptions.  Functional ability and status Nutritional  status Physical activity Advanced directives List of other physicians Hospitalizations, surgeries, and ER visits in previous 12 months Vitals Screenings to include cognitive, depression, and falls Referrals and appointments  In addition, I have reviewed and discussed with patient certain preventive protocols, quality metrics, and best practice recommendations. A written personalized care plan for preventive services as well as general preventive health recommendations were provided to patient.     STresa Endo RPH-CPP   08/05/2021

## 2021-08-06 NOTE — Telephone Encounter (Signed)
Patient was seen on 07/20/2021 by mobile unit. Podiatry referral was placed and they have reached out to patient to schedule.

## 2021-08-24 ENCOUNTER — Other Ambulatory Visit: Payer: Self-pay | Admitting: Pulmonary Disease

## 2021-08-24 DIAGNOSIS — J449 Chronic obstructive pulmonary disease, unspecified: Secondary | ICD-10-CM

## 2021-08-25 ENCOUNTER — Other Ambulatory Visit: Payer: Self-pay | Admitting: Pulmonary Disease

## 2021-08-25 DIAGNOSIS — J449 Chronic obstructive pulmonary disease, unspecified: Secondary | ICD-10-CM

## 2021-09-02 ENCOUNTER — Ambulatory Visit
Admission: RE | Admit: 2021-09-02 | Discharge: 2021-09-02 | Disposition: A | Discharge status: Home / Self Care | Payer: Medicare HMO | Source: Ambulatory Visit | Attending: Physician Assistant | Admitting: Physician Assistant

## 2021-09-02 DIAGNOSIS — N281 Cyst of kidney, acquired: Secondary | ICD-10-CM | POA: Diagnosis not present

## 2021-09-02 DIAGNOSIS — D509 Iron deficiency anemia, unspecified: Secondary | ICD-10-CM | POA: Diagnosis not present

## 2021-09-02 DIAGNOSIS — E279 Disorder of adrenal gland, unspecified: Secondary | ICD-10-CM | POA: Diagnosis not present

## 2021-09-02 DIAGNOSIS — K769 Liver disease, unspecified: Secondary | ICD-10-CM | POA: Diagnosis not present

## 2021-09-02 DIAGNOSIS — R1030 Lower abdominal pain, unspecified: Secondary | ICD-10-CM

## 2021-09-02 MED ORDER — IOPAMIDOL (ISOVUE-300) INJECTION 61%
100.0000 mL | Freq: Once | INTRAVENOUS | Status: AC | PRN
Start: 2021-09-02 — End: 2021-09-02
  Administered 2021-09-02: 100 mL via INTRAVENOUS

## 2021-09-05 ENCOUNTER — Other Ambulatory Visit: Payer: Self-pay | Admitting: Physician Assistant

## 2021-09-05 DIAGNOSIS — E279 Disorder of adrenal gland, unspecified: Secondary | ICD-10-CM | POA: Diagnosis not present

## 2021-09-05 DIAGNOSIS — D509 Iron deficiency anemia, unspecified: Secondary | ICD-10-CM | POA: Diagnosis not present

## 2021-09-05 DIAGNOSIS — K5900 Constipation, unspecified: Secondary | ICD-10-CM | POA: Diagnosis not present

## 2021-09-05 DIAGNOSIS — K589 Irritable bowel syndrome without diarrhea: Secondary | ICD-10-CM | POA: Diagnosis not present

## 2021-09-05 DIAGNOSIS — N2889 Other specified disorders of kidney and ureter: Secondary | ICD-10-CM

## 2021-09-05 DIAGNOSIS — Z86010 Personal history of colonic polyps: Secondary | ICD-10-CM | POA: Diagnosis not present

## 2021-09-05 DIAGNOSIS — K2970 Gastritis, unspecified, without bleeding: Secondary | ICD-10-CM | POA: Diagnosis not present

## 2021-09-20 ENCOUNTER — Other Ambulatory Visit: Payer: Medicare HMO

## 2021-09-22 ENCOUNTER — Other Ambulatory Visit: Payer: Self-pay | Admitting: Family Medicine

## 2021-09-27 ENCOUNTER — Ambulatory Visit
Admission: RE | Admit: 2021-09-27 | Discharge: 2021-09-27 | Disposition: A | Discharge status: Home / Self Care | Payer: Medicare HMO | Source: Ambulatory Visit | Attending: Internal Medicine | Admitting: Internal Medicine

## 2021-09-27 ENCOUNTER — Other Ambulatory Visit: Payer: Self-pay | Admitting: Physician Assistant

## 2021-09-27 DIAGNOSIS — E278 Other specified disorders of adrenal gland: Secondary | ICD-10-CM

## 2021-09-27 DIAGNOSIS — Z780 Asymptomatic menopausal state: Secondary | ICD-10-CM | POA: Diagnosis not present

## 2021-09-27 DIAGNOSIS — M8589 Other specified disorders of bone density and structure, multiple sites: Secondary | ICD-10-CM | POA: Diagnosis not present

## 2021-09-28 ENCOUNTER — Telehealth: Payer: Self-pay | Admitting: Internal Medicine

## 2021-09-28 NOTE — Telephone Encounter (Signed)
PC placed to pt this a.m to go over results of bone density study. Patient informed that bone density study shows that she has osteopenia.  I explained to her what this means that the bone is becoming thin but not at the level to call osteoporosis.  Discussed ways to prevent progression. Rec taking Vit D 400 IU daily and importance of wgh bearing exercise.

## 2021-09-29 ENCOUNTER — Ambulatory Visit
Admission: RE | Admit: 2021-09-29 | Discharge: 2021-09-29 | Disposition: A | Discharge status: Home / Self Care | Payer: Medicare HMO | Source: Ambulatory Visit | Attending: Physician Assistant | Admitting: Physician Assistant

## 2021-09-29 DIAGNOSIS — D4412 Neoplasm of uncertain behavior of left adrenal gland: Secondary | ICD-10-CM | POA: Diagnosis not present

## 2021-09-29 DIAGNOSIS — N281 Cyst of kidney, acquired: Secondary | ICD-10-CM | POA: Diagnosis not present

## 2021-09-29 DIAGNOSIS — D3500 Benign neoplasm of unspecified adrenal gland: Secondary | ICD-10-CM | POA: Diagnosis not present

## 2021-09-29 DIAGNOSIS — E278 Other specified disorders of adrenal gland: Secondary | ICD-10-CM

## 2021-09-29 DIAGNOSIS — N200 Calculus of kidney: Secondary | ICD-10-CM | POA: Diagnosis not present

## 2021-09-29 MED ORDER — IOPAMIDOL (ISOVUE-300) INJECTION 61%
100.0000 mL | Freq: Once | INTRAVENOUS | Status: AC | PRN
Start: 2021-09-29 — End: 2021-09-29
  Administered 2021-09-29: 100 mL via INTRAVENOUS

## 2021-09-30 ENCOUNTER — Other Ambulatory Visit: Payer: Self-pay | Admitting: Internal Medicine

## 2021-09-30 DIAGNOSIS — R7303 Prediabetes: Secondary | ICD-10-CM

## 2021-10-05 DIAGNOSIS — K648 Other hemorrhoids: Secondary | ICD-10-CM | POA: Diagnosis not present

## 2021-10-05 DIAGNOSIS — K2980 Duodenitis without bleeding: Secondary | ICD-10-CM | POA: Diagnosis not present

## 2021-10-05 DIAGNOSIS — K2289 Other specified disease of esophagus: Secondary | ICD-10-CM | POA: Diagnosis not present

## 2021-10-05 DIAGNOSIS — Q438 Other specified congenital malformations of intestine: Secondary | ICD-10-CM | POA: Diagnosis not present

## 2021-10-05 DIAGNOSIS — K2970 Gastritis, unspecified, without bleeding: Secondary | ICD-10-CM | POA: Diagnosis not present

## 2021-10-05 DIAGNOSIS — D124 Benign neoplasm of descending colon: Secondary | ICD-10-CM | POA: Diagnosis not present

## 2021-10-05 DIAGNOSIS — D509 Iron deficiency anemia, unspecified: Secondary | ICD-10-CM | POA: Diagnosis not present

## 2021-10-05 DIAGNOSIS — K31819 Angiodysplasia of stomach and duodenum without bleeding: Secondary | ICD-10-CM | POA: Diagnosis not present

## 2021-10-12 DIAGNOSIS — D124 Benign neoplasm of descending colon: Secondary | ICD-10-CM | POA: Diagnosis not present

## 2021-10-12 DIAGNOSIS — K2289 Other specified disease of esophagus: Secondary | ICD-10-CM | POA: Diagnosis not present

## 2021-10-12 DIAGNOSIS — K2980 Duodenitis without bleeding: Secondary | ICD-10-CM | POA: Diagnosis not present

## 2021-11-09 ENCOUNTER — Telehealth: Payer: Self-pay | Admitting: Internal Medicine

## 2021-11-09 NOTE — Telephone Encounter (Signed)
Call placed to patient and VM was left informing patient to return phone call to schedule a nurse visit for O2 testing.

## 2021-11-11 ENCOUNTER — Ambulatory Visit: Payer: Medicare HMO | Attending: Internal Medicine

## 2021-11-11 ENCOUNTER — Telehealth: Payer: Self-pay | Admitting: Internal Medicine

## 2021-11-11 DIAGNOSIS — J9611 Chronic respiratory failure with hypoxia: Secondary | ICD-10-CM

## 2021-11-11 DIAGNOSIS — J449 Chronic obstructive pulmonary disease, unspecified: Secondary | ICD-10-CM

## 2021-11-11 NOTE — Telephone Encounter (Signed)
Rxn faxed to [facility] for portable O2.  Pt came in to day to be checked O2 levels at rest RA, with exertion (dropped to 91-88%) and on 2 LT 02 - stayed at 98 %

## 2021-11-23 ENCOUNTER — Ambulatory Visit: Payer: Medicare HMO | Admitting: Orthopedic Surgery

## 2021-12-02 ENCOUNTER — Other Ambulatory Visit: Payer: Self-pay | Admitting: Internal Medicine

## 2021-12-02 DIAGNOSIS — I10 Essential (primary) hypertension: Secondary | ICD-10-CM

## 2021-12-02 NOTE — Telephone Encounter (Signed)
Requested Prescriptions  Pending Prescriptions Disp Refills   amLODipine (NORVASC) 10 MG tablet [Pharmacy Med Name: AMLODIPINE BESYLATE 10 MG Tablet] 90 tablet 0    Sig: TAKE 1 TABLET EVERY DAY     Cardiovascular: Calcium Channel Blockers 2 Failed - 12/02/2021 10:34 AM      Failed - Last BP in normal range    BP Readings from Last 1 Encounters:  07/20/21 (!) 179/92         Passed - Last Heart Rate in normal range    Pulse Readings from Last 1 Encounters:  07/20/21 73         Passed - Valid encounter within last 6 months    Recent Outpatient Visits           3 months ago Need for vaccination against Streptococcus pneumoniae   [place], Jarome Matin, RPH-CPP   5 months ago Essential hypertension   [place], MD   1 year ago Stage 1 mild COPD by GOLD classification [facility])   [place] Ladell Pier, MD   1 year ago No-show for appointment   [place] Ladell Pier, MD   1 year ago Acute bronchitis with COPD [facility])   [place] Gildardo Pounds, NP       Future Appointments             In 1 week Marlou Sa, Tonna [place], MD [facility]             Patient will need an office visit for further refills.

## 2021-12-06 DIAGNOSIS — K2970 Gastritis, unspecified, without bleeding: Secondary | ICD-10-CM | POA: Diagnosis not present

## 2021-12-06 DIAGNOSIS — K5900 Constipation, unspecified: Secondary | ICD-10-CM | POA: Diagnosis not present

## 2021-12-06 DIAGNOSIS — K219 Gastro-esophageal reflux disease without esophagitis: Secondary | ICD-10-CM | POA: Diagnosis not present

## 2021-12-06 DIAGNOSIS — K2980 Duodenitis without bleeding: Secondary | ICD-10-CM | POA: Diagnosis not present

## 2021-12-06 DIAGNOSIS — Z86010 Personal history of colonic polyps: Secondary | ICD-10-CM | POA: Diagnosis not present

## 2021-12-06 DIAGNOSIS — D509 Iron deficiency anemia, unspecified: Secondary | ICD-10-CM | POA: Diagnosis not present

## 2021-12-06 DIAGNOSIS — K589 Irritable bowel syndrome without diarrhea: Secondary | ICD-10-CM | POA: Diagnosis not present

## 2021-12-14 ENCOUNTER — Encounter: Payer: Self-pay | Admitting: Internal Medicine

## 2021-12-14 ENCOUNTER — Ambulatory Visit (INDEPENDENT_AMBULATORY_CARE_PROVIDER_SITE_OTHER): Payer: Medicare HMO

## 2021-12-14 ENCOUNTER — Ambulatory Visit (INDEPENDENT_AMBULATORY_CARE_PROVIDER_SITE_OTHER): Payer: Medicare HMO | Admitting: Surgical

## 2021-12-14 DIAGNOSIS — M25561 Pain in right knee: Secondary | ICD-10-CM

## 2021-12-14 DIAGNOSIS — G8929 Other chronic pain: Secondary | ICD-10-CM

## 2021-12-14 DIAGNOSIS — K31819 Angiodysplasia of stomach and duodenum without bleeding: Secondary | ICD-10-CM | POA: Insufficient documentation

## 2021-12-18 ENCOUNTER — Encounter: Payer: Self-pay | Admitting: Orthopedic Surgery

## 2021-12-18 NOTE — Progress Notes (Signed)
Office Visit Note   Patient: Alison Ford           Date of Birth: 1956/01/16           MRN: [id number] Visit Date: 12/14/2021 Requested by: Ladell Pier, MD [place],  [place] [id number] PCP: Ladell Pier, MD  Subjective: Chief Complaint  Patient presents with   Right Shoulder - Pain   Right Knee - Pain    HPI: Alison Ford is a 66 y.o. female who presents to the office reporting right knee and right shoulder pain.  Patient states that she has history of chronic right knee pain s/p right total knee arthroplasty that was done in 2014 by Dr. Noemi Chapel.  She denies any consistent fevers or chills or any drainage from her incision in the right knee.  She does have start up pain and has about 3-minute walking endurance.  No radicular pain but she does have occasional numbness and tingling in the right leg.  Also has low back pain.  No groin pain.  Feels like the leg gives out on her and caused her to fall a lot.  She also complains of right shoulder pain.  She has primarily posterior lateral right shoulder pain that radiates down to the forearm.  She has history of carpal tunnel release on the right side.  Does have some numbness and tingling in her right hand and weakness in her right arm.  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  Patient has a history of multilevel cervical spondylosis with foraminal narrowing on the right at multiple levels that was rated mild back in October 2021.              ROS: All systems reviewed are negative as they relate to the chief complaint within the history of present illness.  Patient denies fevers or chills.  Assessment & Plan: Visit Diagnoses:  1. Chronic pain of right knee     Plan:  Patient is a 66 year old female who presents with complaint of multiple joint complaints today.  Apparently she complains of right knee pain with history of right total knee arthroplasty in 2014 by Dr. Para March.  She has radiographs taken today as well as in the past demonstrating aseptic loosening of the implant.  Options for the right knee were discussed including physical therapy versus consideration of total knee revision.  She does not want to proceed with any sort of knee replacement revision at this point.  Plan to start physical therapy on [facility] for right leg strengthening.  She also complains of right shoulder pain.  She has what appears to be multifactorial cause of shoulder pain with contribution of pain from her cervical spine as well as torn retracted rotator cuff tear and severe osteoarthritis of the right glenohumeral joint.  Discussed the options available to patient including injection into the glenohumeral joint versus physical therapy for the right arm versus new MRI for further evaluation of current foraminal stenosis extent versus cervical spine ESI.  She would like to hold off on any intervention for right shoulder pain today.  Patient would like to be considered for motorized scooter.  Prescription given for this.  Dry scooter is necessary due to patient's difficulty with ambulation  and her multiple falls.  She has difficulty using a wheelchair or walker due to the severity of her right shoulder pain and the weakness that she has with grip strength and her rotator cuff function.  Plan for patient to follow-up in 6 weeks for clinical recheck with Dr. Marlou Sa.  Follow-Up Instructions: No follow-ups on file.   Orders:  Orders Placed This Encounter  Procedures   XR KNEE 3 VIEW RIGHT   Ambulatory referral to Physical Therapy   No orders of the defined types were placed in this encounter.     Procedures: No procedures performed   Clinical Data: No additional  findings.  Objective: Vital Signs: There were no vitals taken for this visit.  Physical Exam:  Constitutional: Patient appears well-developed HEENT:  Head: Normocephalic Eyes:EOM are normal Neck: Normal range of motion Cardiovascular: Normal rate Pulmonary/chest: Effort normal Neurologic: Patient is alert Skin: Skin is warm Psychiatric: Patient has normal mood and affect  Ortho Exam: Ortho exam demonstrates right shoulder with 50 degrees X rotation, 80 degrees abduction, 170 degrees forward flexion.  4/5 weakness of infraspinatus and supraspinatus and grip strength testing.  4/5 weakness of EPL and finger abduction of the right hand.  5/5 motor strength of bilateral FPL, bicep, tricep.  Axillary nerve is intact with deltoid firing on the right-hand side.  Positive Spurling sign.  Negative Lhermitte sign.  Increased pain with cervical spine range of motion.  Increased pain with passive range of motion of the right shoulder joint.  Right knee with 0 degrees extension 115 degrees of knee flexion.  No effusion.  Incision is well-healed from prior right total knee arthroplasty.  No sinus tract noted.  No redness or any evidence of infection.  No calf tenderness.  Negative Homans' sign.  She has tenderness primarily through the medial lateral aspects of the proximal tibia.  She is able to perform straight leg raise.  Intact hip flexion, quadricep, hamstring, dorsiflexion, plantarflexion rated 5/5.  Specialty Comments:  No specialty comments available.  Imaging: No results found.   PMFS History: Patient Active Problem List   Diagnosis Date Noted   Angiodysplasia of stomach and duodenum without bleeding 12/14/2021   COVID-19 12/28/2020   Class 1 obesity due to excess calories with serious comorbidity and body mass index (BMI) of 31.0 to 31.9 in adult 05/15/2019   Prediabetes 05/15/2019   Iron deficiency anemia 72/53/6644   Systolic ejection murmur 03/47/4259   Hypoxemia requiring  supplemental oxygen 05/15/2019   Normocytic anemia 04/29/2019   Chronic obstructive pulmonary disease ([place]) 05/30/2016   Rotator cuff tear 05/02/2016   Rotator cuff tendonitis, right 12/30/2015   Cervicalgia 12/30/2015   Eczema, dyshidrotic 11/18/2014   Acne 11/18/2014   Smoker 06/01/2014   Right knee DJD    Hyperlipidemia    HTN (hypertension) 06/05/2012   Past Medical History:  Diagnosis Date   Abnormal facial hair    Back pain    d/t knee pain   Headache(784.0)    Heart murmur    slight   HTN (hypertension)    takes Amlodipine daily and HCTZ    Hyperlipidemia    takes Lovastatin nightly   Joint pain    Right knee DJD    Weakness    left hand    Family History  Problem Relation Age of Onset   CAD Father 64   CAD Mother 6   Hypertension Mother     Past Surgical History:  Procedure Laterality Date   ABDOMINAL HYSTERECTOMY  Athroscopic knee surgery     Bilateral   CARPAL TUNNEL RELEASE Right 05/02/2016   Procedure: CARPAL TUNNEL RELEASE;  Surgeon: Meredith Pel, MD;  Location: [place];  Service: Orthopedics;  Laterality: Right;   CHOLECYSTECTOMY     DILATION AND CURETTAGE OF UTERUS     EYE SURGERY Right    SHOULDER ARTHROSCOPY WITH SUBACROMIAL DECOMPRESSION Right 05/02/2016   Procedure: RIGHT SHOULDER ARTHROSCOPY WITH SUBACROMIAL DECOMPRESSION, MINI OPEN ROTATOR CUFF TEAR REPAIR, AND RIGHT CARPAL TUNNEL RELEASE;  Surgeon: Meredith Pel, MD;  Location: [place];  Service: Orthopedics;  Laterality: Right;   TOTAL KNEE ARTHROPLASTY Right 07/15/2012   Procedure: RIGHT TOTAL KNEE ARTHROPLASTY;  Surgeon: Lorn Junes, MD;  Location: [place];  Service: Orthopedics;  Laterality: Right;   Social History   Occupational History    Employer: [id number]  Tobacco Use   Smoking status: Every Day    Packs/day: 0.25    Types: Cigarettes    Last attempt to quit: 08/13/2020    Years since quitting: 1.3   Smokeless tobacco: Never   Tobacco comments:    Started smoking   Substance and Sexual Activity   Alcohol use: Yes    Alcohol/week: 0.0 standard drinks of alcohol    Comment: ocassionally   Drug use: No   Sexual activity: Never

## 2021-12-21 DIAGNOSIS — I110 Hypertensive heart disease with heart failure: Secondary | ICD-10-CM | POA: Diagnosis not present

## 2021-12-21 DIAGNOSIS — Z6828 Body mass index (BMI) 28.0-28.9, adult: Secondary | ICD-10-CM | POA: Diagnosis not present

## 2021-12-21 DIAGNOSIS — D508 Other iron deficiency anemias: Secondary | ICD-10-CM | POA: Diagnosis not present

## 2021-12-21 DIAGNOSIS — E785 Hyperlipidemia, unspecified: Secondary | ICD-10-CM | POA: Diagnosis not present

## 2021-12-21 DIAGNOSIS — F17210 Nicotine dependence, cigarettes, uncomplicated: Secondary | ICD-10-CM | POA: Diagnosis not present

## 2021-12-21 DIAGNOSIS — I700 Atherosclerosis of aorta: Secondary | ICD-10-CM | POA: Diagnosis not present

## 2021-12-21 DIAGNOSIS — I509 Heart failure, unspecified: Secondary | ICD-10-CM | POA: Diagnosis not present

## 2021-12-21 DIAGNOSIS — E663 Overweight: Secondary | ICD-10-CM | POA: Diagnosis not present

## 2021-12-21 DIAGNOSIS — Z79899 Other long term (current) drug therapy: Secondary | ICD-10-CM | POA: Diagnosis not present

## 2021-12-26 ENCOUNTER — Other Ambulatory Visit: Payer: Self-pay | Admitting: Internal Medicine

## 2021-12-26 DIAGNOSIS — I10 Essential (primary) hypertension: Secondary | ICD-10-CM

## 2022-01-25 ENCOUNTER — Other Ambulatory Visit: Payer: Self-pay | Admitting: Internal Medicine

## 2022-04-12 ENCOUNTER — Telehealth: Payer: Self-pay | Admitting: Internal Medicine

## 2022-04-12 NOTE — Telephone Encounter (Signed)
Medication Refill - Medication: Home Oxygen Tank and Emergency Tank  Has the patient contacted their pharmacy? Yes.   Patient called stated the pharmacy has changed from [facility] to [facility] for her oxygen but was not able to locate the script. [facility] no longer provide oxygen. She will have to pay out of pocket after Thursday if she doesn't get it transferred.  Preferred Pharmacy (with phone number or street name):  [facility] [phone]; fax# [phone]  Has the patient been seen for an appointment in the last year OR does the patient have an upcoming appointment? Yes.

## 2022-04-12 NOTE — Telephone Encounter (Signed)
Spoke with [facility].  Patient is leaving  [place]  d/t insurance change.  Will fax oxygen order, demographics along with last  Face to Face visit to [facility]. Spoke with [facility] to aware them that it is being faxed.

## 2022-04-12 NOTE — Telephone Encounter (Signed)
Patient is needing new Rx for oxygen and supplies- see notes

## 2022-04-14 ENCOUNTER — Telehealth (INDEPENDENT_AMBULATORY_CARE_PROVIDER_SITE_OTHER): Payer: Self-pay | Admitting: Internal Medicine

## 2022-04-14 ENCOUNTER — Telehealth: Payer: Self-pay | Admitting: Internal Medicine

## 2022-04-14 NOTE — Telephone Encounter (Signed)
Copied from [place] [phone]. Topic: Medical Record Request - Other >> Apr 13, 2022  9:36 AM Erskine Squibb wrote: Reason for CRM: Helene Kelp with [facility] is faxing over a form for the provider to fill out concerning portable oxygen. Please fill out as soon as possible and send back.

## 2022-04-14 NOTE — Telephone Encounter (Signed)
Copied from [place] [id number]. Topic: Medical Record Request - Other >> Apr 13, 2022  9:36 AM Erskine Squibb wrote: Reason for CRM: Alison Ford with [facility] is faxing over a form for the provider to fill out concerning portable oxygen. Please fill out as soon as possible and send back.

## 2022-04-17 NOTE — Telephone Encounter (Unsigned)
Copied from [place] [phone]. Topic: Medical Record Request - Other >> Apr 13, 2022  9:36 AM Erskine Squibb wrote: Reason for CRM: Alison Ford with [facility] is faxing over a form for the provider to fill out concerning portable oxygen. Please fill out as soon as possible and send back.

## 2022-04-19 NOTE — Telephone Encounter (Signed)
Duplicate. Error

## 2022-04-19 NOTE — Telephone Encounter (Signed)
Patient has been scheduled for an appointment on 04/25/2022 to complete forms per Dr.Johnson.

## 2022-04-25 ENCOUNTER — Encounter: Payer: Self-pay | Admitting: Internal Medicine

## 2022-04-25 ENCOUNTER — Ambulatory Visit: Payer: Medicare HMO | Attending: Internal Medicine | Admitting: Internal Medicine

## 2022-04-25 VITALS — BP 121/74 | HR 72 | Temp 98.7°F | Ht 67.0 in | Wt 182.0 lb

## 2022-04-25 DIAGNOSIS — Z9981 Dependence on supplemental oxygen: Secondary | ICD-10-CM

## 2022-04-25 DIAGNOSIS — J9611 Chronic respiratory failure with hypoxia: Secondary | ICD-10-CM

## 2022-04-25 DIAGNOSIS — I700 Atherosclerosis of aorta: Secondary | ICD-10-CM

## 2022-04-25 DIAGNOSIS — I10 Essential (primary) hypertension: Secondary | ICD-10-CM

## 2022-04-25 DIAGNOSIS — D508 Other iron deficiency anemias: Secondary | ICD-10-CM

## 2022-04-25 DIAGNOSIS — E278 Other specified disorders of adrenal gland: Secondary | ICD-10-CM

## 2022-04-25 DIAGNOSIS — J449 Chronic obstructive pulmonary disease, unspecified: Secondary | ICD-10-CM | POA: Diagnosis not present

## 2022-04-25 DIAGNOSIS — E785 Hyperlipidemia, unspecified: Secondary | ICD-10-CM

## 2022-04-25 DIAGNOSIS — F17200 Nicotine dependence, unspecified, uncomplicated: Secondary | ICD-10-CM | POA: Diagnosis not present

## 2022-04-25 DIAGNOSIS — R7303 Prediabetes: Secondary | ICD-10-CM

## 2022-04-25 MED ORDER — ALBUTEROL SULFATE HFA 108 (90 BASE) MCG/ACT IN AERS: 2.0000 | INHALATION_SPRAY | Freq: Four times a day (QID) | RESPIRATORY_TRACT | 5 refills | Status: DC | PRN

## 2022-04-25 MED ORDER — TRELEGY ELLIPTA 100-62.5-25 MCG/ACT IN AEPB: 1.0000 | INHALATION_SPRAY | Freq: Every day | RESPIRATORY_TRACT | 11 refills | Status: DC

## 2022-04-25 NOTE — Progress Notes (Signed)
Patient ID: Alison Ford, female    DOB: Feb 05, 1955  MRN: [id number]  CC: Chronic disease management and follow-up on need for oxygen Follow-up (Oxygen supply./)   Subjective: Alison Ford is a 67 y.o. female who presents for chronic disease management Her concerns today include:  Pt with hx of  HTN, preDM, HL, tob dep, COPD stage 1 (Dr. Wynona Neatlalere), on home O2, IDA, Vit D def and s/p RT rotator cuff repair and RT CTS release, LT OA knee, chronic constipation, IBS, history of colon polyps.  COPD/chronic hypoxia: pt reports she uses her portable O2 but not using her stationary device at home much.  Sometimes she sleeps with it on.  Set to 2 L with ambulation.  Patient has changed her oxygen supplier from [employer] to [facility].  She is needing to be recertified for O2. Should be on Trelegy inhaler and Albuterol inhaler but out x 2 mths.  Uses neb about 3-4x/mth She continues to smoke 1/2 pk a day. Tries to quit but finds it difficult.   She has not seen Dr. Maxwell Marionlelere in 3 yrs  HL: Reports compliance with Mevacor.  Last LDL was 95.  IDA: [facility]  Last CBC done June of last year revealed H/H of 9.1 and 30.7.  I advised that she restart the iron supplement.  She has been taking it daily  PreDM/overwgh:  still on Metformin 500 mg 1/2 tab daily.  Feels she does okay with eating habits.  HTN:  compliant with Norvasc 10 mg daily and Lisinopril/HCTZ 20/25 mg.  Checks BP 2 x a wk.  Reports readings have been good. No CP/LE edema/dizziness  Had CT of abdomen done by GI 09/2021.  Incidental finding of LT adrenal nodule defined as benign microscopic fat-containing adenoma radiographically and aortic atherosclerosis.  Radiologist recommended no further follow-up or characterization is required for the adrenal nodule.   HM:  [facility] Patient Active Problem List   Diagnosis Date Noted   Angiodysplasia of stomach and duodenum without  bleeding 12/14/2021   COVID-19 12/28/2020   Class 1 obesity due to excess calories with serious comorbidity and body mass index (BMI) of 31.0 to 31.9 in adult 05/15/2019   Prediabetes 05/15/2019   Iron deficiency anemia 05/15/2019   Systolic ejection murmur 05/15/2019   Hypoxemia requiring supplemental oxygen 05/15/2019   Normocytic anemia 04/29/2019   Chronic obstructive pulmonary disease 05/30/2016   Rotator cuff tear 05/02/2016   Rotator cuff tendonitis, right 12/30/2015   Cervicalgia 12/30/2015   Eczema, dyshidrotic 11/18/2014   Acne 11/18/2014   Smoker 06/01/2014   Right knee DJD    Hyperlipidemia    HTN (hypertension) 06/05/2012     Current Outpatient Medications on File Prior to Visit  Medication Sig Dispense Refill   albuterol (PROVENTIL) (2.5 MG/3ML) 0.083% nebulizer solution Take 3 mLs (2.5 mg total) by nebulization every 6 (six) hours as needed for wheezing or shortness of breath. 150 mL 1   amLODipine (NORVASC) 10 MG tablet TAKE 1 TABLET EVERY DAY 90 tablet 0   aspirin EC 81 MG tablet Take 1 tablet (81 mg total) by mouth daily. 90 tablet 3   Blood Glucose Monitoring Suppl (TRUE METRIX METER) w/Device KIT Use as directed 1 kit 0   cetirizine (ZYRTEC) 10 MG tablet Take 1 tablet (10 mg total) by mouth daily. 30 tablet 6   clotrimazole-betamethasone (LOTRISONE) cream APPLY CREAM TOPICALLY TO AFFECTED AREA TWICE DAILY  30 g 0   glucose blood (TRUE METRIX BLOOD GLUCOSE TEST) test strip Use to check blood sugar once daily. 100 each 4   LINZESS 145 MCG CAPS capsule TAKE 1 CAPSULE EVERY DAY BEFORE BREAKFAST 90 capsule 1   lisinopril-hydrochlorothiazide (ZESTORETIC) 20-25 MG tablet TAKE 1 TABLET EVERY DAY 30 tablet 0   lovastatin (MEVACOR) 40 MG tablet Take 1 tablet (40 mg total) by mouth at bedtime. 90 tablet 2   metFORMIN (GLUCOPHAGE) 500 MG tablet TAKE 1/2 TABLET ONCE DAILY WITH BREAKFAST 45 tablet 0   SV IRON 325 (65 Fe) MG tablet TAKE 1 TABLET BY MOUTH IN THE MORNING AND 1 AT  BEDTIME 60 tablet 0   TRUEplus Lancets 28G MISC Use as directed 100 each 4   albuterol (VENTOLIN HFA) 108 (90 Base) MCG/ACT inhaler INHALE 2 PUFFS BY MOUTH 4 TIMES DAILY AS NEEDED (Patient not taking: Reported on 04/25/2022) 18 g 0   cromolyn (OPTICROM) 4 % ophthalmic solution Place 1 drop into both eyes 4 (four) times daily. (Patient not taking: Reported on 07/20/2021)  98   nicotine polacrilex (NICORETTE) 2 MG gum Take 1 each (2 mg total) by mouth as needed for smoking cessation. (Patient not taking: Reported on 04/25/2022) 100 tablet 3   oxyCODONE-acetaminophen (PERCOCET) 5-325 MG tablet Take 1-2 tablets by mouth every 4 (four) hours as needed. (Patient not taking: Reported on 04/25/2022) 12 tablet 0   pantoprazole (PROTONIX) 40 MG tablet Take 1 tablet (40 mg total) by mouth daily. 90 tablet 0   TRELEGY ELLIPTA 100-62.5-25 MCG/INH AEPB INHALE 1 PUFF INTO THE LUNGS EVERY MORNING. (Patient not taking: Reported on 04/25/2022) 180 each 1   No current facility-administered medications on file prior to visit.    Allergies  Allergen Reactions   Penicillins Itching and Other (See Comments)    Burning and whelps Has patient had a PCN reaction causing immediate rash, facial/tongue/throat swelling, SOB or lightheadedness with hypotension: NO Has patient had a PCN reaction causing severe rash involving mucus membranes or skin necrosis: NO Has patient had a PCN reaction that required hospitalization: NO Has patient had a PCN reaction occurring within the last 10 years: NO If all of the above answers are "NO", then may proceed with Cephalosporin use.    Social History   Socioeconomic History   Marital status: Legally Separated    Spouse name: Not on file   Number of children: 3   Years of education: Not on file   Highest education level: Not on file  Occupational History    Employer: [employer]  Tobacco Use   Smoking status: Every Day    Packs/day: .25    Types: Cigarettes    Last attempt to quit:  08/13/2020    Years since quitting: 1.6   Smokeless tobacco: Never   Tobacco comments:    Started smoking  Substance and Sexual Activity   Alcohol use: Yes    Alcohol/week: 0.0 standard drinks of alcohol    Comment: ocassionally   Drug use: No   Sexual activity: Never  Other Topics Concern   Not on file  Social History Narrative   Lives by herself now.    Social Determinants of Health   Financial Resource Strain: Not on file  Food Insecurity: Not on file  Transportation Needs: Not on file  Physical Activity: Not on file  Stress: Not on file  Social Connections: Not on file  Intimate Partner Violence: Not on file    Family History  Problem  Relation Age of Onset   CAD Father 65   CAD Mother 26   Hypertension Mother     Past Surgical History:  Procedure Laterality Date   ABDOMINAL HYSTERECTOMY     Athroscopic knee surgery     Bilateral   CARPAL TUNNEL RELEASE Right 05/02/2016   Procedure: CARPAL TUNNEL RELEASE;  Surgeon: Cammy Copa, MD;  Location: [facility] OR;  Service: Orthopedics;  Laterality: Right;   CHOLECYSTECTOMY     DILATION AND CURETTAGE OF UTERUS     EYE SURGERY Right    SHOULDER ARTHROSCOPY WITH SUBACROMIAL DECOMPRESSION Right 05/02/2016   Procedure: RIGHT SHOULDER ARTHROSCOPY WITH SUBACROMIAL DECOMPRESSION, MINI OPEN ROTATOR CUFF TEAR REPAIR, AND RIGHT CARPAL TUNNEL RELEASE;  Surgeon: Cammy Copa, MD;  Location: [facility] OR;  Service: Orthopedics;  Laterality: Right;   TOTAL KNEE ARTHROPLASTY Right 07/15/2012   Procedure: RIGHT TOTAL KNEE ARTHROPLASTY;  Surgeon: Nilda Simmer, MD;  Location: [facility] OR;  Service: Orthopedics;  Laterality: Right;    ROS: Review of Systems Negative except as stated above  PHYSICAL EXAM: BP 121/74 (BP Location: Left Arm, Patient Position: Sitting, Cuff Size: Normal)   Pulse 72   Temp 98.7 F (37.1 C) (Oral)   Ht 5\' 7"  (1.702 m)   Wt 182 lb (82.6 kg)   SpO2 98%   BMI 28.51 kg/m   Wt Readings from Last 3 Encounters:   04/25/22 182 lb (82.6 kg)  08/05/21 179 lb 6.4 oz (81.4 kg)  07/20/21 185 lb (83.9 kg)  Patient does not have her portable oxygen with her today.  Pulse ox room air sitting 98%. Pulse ox room air on ambulation 3 times around the nurses station was 88 to 96% with oxygen level staying at 88% consistently on the final lab. Pulse ox 2 L O2 with ambulation 93 to 97%.  Physical Exam  General appearance - alert, well appearing, and in no distress Mental status - normal mood, behavior, speech, dress, motor activity, and thought processes Neck - supple, no significant adenopathy Chest -breath sounds mildly decreased bilaterally.  No wheezes or crackles heard. Heart - 2/6 SEM left upper sternal border.  Regular rate rhythm. Extremities -no lower extremity edema.      Latest Ref Rng & Units 06/24/2021    4:51 PM 11/26/2019   10:51 AM 12/25/2018   10:45 AM  CMP  Glucose 70 - 99 mg/dL 412  99  94   BUN 8 - 27 mg/dL 17  13  11    Creatinine 0.57 - 1.00 mg/dL 8.20  8.13  8.87   Sodium 134 - 144 mmol/L 141  140  138   Potassium 3.5 - 5.2 mmol/L 4.3  4.6  4.2   Chloride 96 - 106 mmol/L 104  104  102   CO2 20 - 29 mmol/L 21  23  22    Calcium 8.7 - 10.3 mg/dL 19.5  97.4  9.6   Total Protein 6.0 - 8.5 g/dL 6.9  6.8  7.0   Total Bilirubin 0.0 - 1.2 mg/dL <7.1  0.2  0.2   Alkaline Phos 44 - 121 IU/L 82  79  90   AST 0 - 40 IU/L 16  19  21    ALT 0 - 32 IU/L 13  18  17     Lipid Panel     Component Value Date/Time   CHOL 167 06/24/2021 1651   TRIG 110 06/24/2021 1651   HDL 52 06/24/2021 1651   CHOLHDL 3.2 06/24/2021 1651  CHOLHDL 4.0 03/17/2014 0922   VLDL 41 (H) 03/17/2014 0922   LDLCALC 95 06/24/2021 1651    CBC    Component Value Date/Time   WBC 6.2 06/24/2021 1651   WBC 7.0 05/07/2017 1422   RBC 4.28 06/24/2021 1651   RBC 4.03 05/07/2017 1422   HGB 9.1 (L) 06/24/2021 1651   HCT 30.7 (L) 06/24/2021 1651   PLT 342 06/24/2021 1651   MCV 72 (L) 06/24/2021 1651   MCH 21.3 (L)  06/24/2021 1651   MCH 30.5 05/07/2017 1422   MCHC 29.6 (L) 06/24/2021 1651   MCHC 34.4 05/07/2017 1422   RDW 16.5 (H) 06/24/2021 1651   LYMPHSABS 3,171 05/07/2017 1422   MONOABS 448 09/29/2015 1158   EOSABS 273 05/07/2017 1422   BASOSABS 42 05/07/2017 1422    ASSESSMENT AND PLAN:  1. Stage 1 mild COPD by GOLD classification Refill given on Trelegy inhaler and Ventolin inhaler. Strongly encouraged her to discontinue smoking. - Ambulatory referral to Pulmonology - Fluticasone-Umeclidin-Vilant (TRELEGY ELLIPTA) 100-62.5-25 MCG/ACT AEPB; Inhale 1 puff into the lungs daily.  Dispense: 1 each; Refill: 11 - albuterol (VENTOLIN HFA) 108 (90 Base) MCG/ACT inhaler; Inhale 2 puffs into the lungs every 6 (six) hours as needed for wheezing or shortness of breath.  Dispense: 18 g; Refill: 5  2. Chronic respiratory failure with hypoxia, on home oxygen therapy Encouraged her to always have her portable oxygen with her when she leaves the house. She meets criteria for use of O2 2 L with ambulation.  Advised not to smoke around oxygen tank.  3. Tobacco dependence Strongly advised to quit.  4. Left adrenal mass No further follow-up radiologically needed per radiology note as this appears benign.  5. Essential hypertension At goal.  Continue amlodipine 10 mg daily and lisinopril/HCTZ 20/25 mg once a day. - Comprehensive metabolic panel  6. Hyperlipidemia, unspecified hyperlipidemia type Continue Mevacor. - Lipid panel  7. Aortic atherosclerosis Continue Mevacor.  Recheck lipid profile today.  8. Other iron deficiency anemia - CBC  9. Prediabetes Encourage healthy eating habits.  Continue low-dose metformin. - Hemoglobin A1c    Patient was given the opportunity to ask questions.  Patient verbalized understanding of the plan and was able to repeat key elements of the plan.   This documentation was completed using Paediatric nurse.  Any transcriptional errors are  unintentional.  No orders of the defined types were placed in this encounter.    Requested Prescriptions    No prescriptions requested or ordered in this encounter    No follow-ups on file.  Jonah Blue, MD, FACP

## 2022-04-26 ENCOUNTER — Telehealth: Payer: Self-pay | Admitting: Internal Medicine

## 2022-04-26 LAB — CBC
Hematocrit: 40.8 % (ref 34.0–46.6)
Hemoglobin: 13.5 g/dL (ref 11.1–15.9)
MCH: 31.1 pg (ref 26.6–33.0)
MCHC: 33.1 g/dL (ref 31.5–35.7)
MCV: 94 fL (ref 79–97)
Platelets: 296 10*3/uL (ref 150–450)
RBC: 4.34 x10E6/uL (ref 3.77–5.28)
RDW: 11 % — ABNORMAL LOW (ref 11.7–15.4)
WBC: 5.6 10*3/uL (ref 3.4–10.8)

## 2022-04-26 LAB — LIPID PANEL
Chol/HDL Ratio: 3.2 ratio (ref 0.0–4.4)
Cholesterol, Total: 193 mg/dL (ref 100–199)
HDL: 60 mg/dL (ref >39)
LDL Chol Calc (NIH): 109 mg/dL — ABNORMAL HIGH (ref 0–99)
Triglycerides: 140 mg/dL (ref 0–149)
VLDL Cholesterol Cal: 24 mg/dL (ref 5–40)

## 2022-04-26 LAB — COMPREHENSIVE METABOLIC PANEL
ALT: 17 IU/L (ref 0–32)
AST: 15 IU/L (ref 0–40)
Albumin/Globulin Ratio: 1.7 (ref 1.2–2.2)
Albumin: 4.2 g/dL (ref 3.9–4.9)
Alkaline Phosphatase: 88 IU/L (ref 44–121)
BUN/Creatinine Ratio: 16 (ref 12–28)
BUN: 13 mg/dL (ref 8–27)
Bilirubin Total: <0.2 mg/dL (ref 0.0–1.2)
CO2: 23 mmol/L (ref 20–29)
Calcium: 10 mg/dL (ref 8.7–10.3)
Chloride: 104 mmol/L (ref 96–106)
Creatinine, Ser: 0.81 mg/dL (ref 0.57–1.00)
Globulin, Total: 2.5 g/dL (ref 1.5–4.5)
Glucose: 114 mg/dL — ABNORMAL HIGH (ref 70–99)
Potassium: 4.2 mmol/L (ref 3.5–5.2)
Sodium: 141 mmol/L (ref 134–144)
Total Protein: 6.7 g/dL (ref 6.0–8.5)
eGFR: 80 mL/min/{1.73_m2} (ref >59)

## 2022-04-26 LAB — HEMOGLOBIN A1C
Est. average glucose Bld gHb Est-mCnc: 117 mg/dL
Hgb A1c MFr Bld: 5.7 % — ABNORMAL HIGH (ref 4.8–5.6)

## 2022-04-26 NOTE — Telephone Encounter (Signed)
-----   Message from Drucilla Chalet, RPH-CPP sent at 04/25/2022  2:15 PM EDT ----- Hey friend. This patient has taken her zoster, influenza, HepA/B, and PNA vaccines at this store. There is no tetanus listed on [facility]. Called [facility] and confirmed they have not given tetanus. ----- Message ----- From: Marcine Matar, MD Sent: 04/25/2022   1:18 PM EDT To: Drucilla Chalet, RPH-CPP  This patient states that she had pneumonia vaccine and Tdap at [facility].  Please get the information so that we can update health maintenance.  Thanks.

## 2022-04-26 NOTE — Telephone Encounter (Signed)
Called & spoke to the patient. Verified name & DOB. Informed that the [facility] pharmacy has confirmed that she has had her pneumonia and shingles vaccine. Inquired if patient would like to make an appointment at [facility] or [facility] for her tetanus vaccine and patient insisted to get vaccine at the [facility] pharmacy and will have staff fax [facility] the vaccine record.

## 2022-05-01 ENCOUNTER — Telehealth: Payer: Self-pay | Admitting: Internal Medicine

## 2022-05-01 NOTE — Telephone Encounter (Signed)
Copied from CRM [id number]. Topic: General - Other >> May 01, 2022  1:54 PM Carrielelia G wrote:  Reason for CRM: pt is concerned about forms that were sent on April 9th. Patient called [employer] and they stated  that the the forms were not completed and that PCP needs to complete them. Patient would like for Dr Laural Benes to call or her nurse. It has been a week.

## 2022-05-01 NOTE — Telephone Encounter (Signed)
Called & LVM to call back. Forms have been successfully faxed to [facility] on 05/01/2022.

## 2022-05-17 ENCOUNTER — Telehealth: Payer: Self-pay | Admitting: Orthopedic Surgery

## 2022-05-17 NOTE — Telephone Encounter (Signed)
Patient needs a updated Rx for a motorized scooter the last one she was given she went to several different placed but no one would accept it so she was frustrated and took a break but now she has found a place who will fill the Rx for her but they need a new Rx for the scooter please advise, patient can pick it up today or tomorrow per her request

## 2022-05-23 ENCOUNTER — Telehealth: Payer: Self-pay | Admitting: Orthopedic Surgery

## 2022-05-23 NOTE — Telephone Encounter (Signed)
Pt called fro RX for mobile scooter. Please call pt when ready for pick up. Pt phone number is [phone]. Script given is expired

## 2022-05-24 ENCOUNTER — Telehealth: Payer: Self-pay | Admitting: Orthopedic Surgery

## 2022-05-24 NOTE — Telephone Encounter (Signed)
Patient wanting a rx for a scooter. Please advise

## 2022-05-29 NOTE — Telephone Encounter (Signed)
Okay for this.  

## 2022-05-30 NOTE — Telephone Encounter (Signed)
At front for pick up, I left voicemail advising.

## 2022-06-01 ENCOUNTER — Ambulatory Visit: Payer: Medicare HMO | Admitting: Pulmonary Disease

## 2022-06-05 ENCOUNTER — Encounter: Payer: Self-pay | Admitting: Pulmonary Disease

## 2022-06-05 ENCOUNTER — Ambulatory Visit (INDEPENDENT_AMBULATORY_CARE_PROVIDER_SITE_OTHER): Payer: Medicare HMO | Admitting: Pulmonary Disease

## 2022-06-05 VITALS — BP 110/72 | HR 73 | Ht 67.5 in | Wt 183.4 lb

## 2022-06-05 DIAGNOSIS — J4489 Other specified chronic obstructive pulmonary disease: Secondary | ICD-10-CM

## 2022-06-05 DIAGNOSIS — J439 Emphysema, unspecified: Secondary | ICD-10-CM | POA: Diagnosis not present

## 2022-06-05 MED ORDER — TRELEGY ELLIPTA 100-62.5-25 MCG/ACT IN AEPB: 1.0000 | INHALATION_SPRAY | Freq: Every day | RESPIRATORY_TRACT | 3 refills | Status: DC

## 2022-06-05 MED ORDER — ALBUTEROL SULFATE HFA 108 (90 BASE) MCG/ACT IN AERS: 2.0000 | INHALATION_SPRAY | Freq: Four times a day (QID) | RESPIRATORY_TRACT | 6 refills | Status: DC | PRN

## 2022-06-05 NOTE — Patient Instructions (Signed)
Schedule for pulmonary function test  Schedule for low-dose CT scan -Lung cancer screening  Continue to work on quitting smoking  Prescription for Trelegy will be sent to pharmacy for you  Prescription for albuterol will be sent to pharmacy  Call us with any significant concerns  It is important to make sure you quit smoking as this is the biggest factor regarding progression of obstructive lung disease  I will see you back in about 2 months

## 2022-06-05 NOTE — Progress Notes (Signed)
Alison Ford    [id number]    1955-05-11  Primary Care Physician:Johnson, Binnie Rail, MD  Referring Physician: Marcine Matar, MD [address] [place] [address] [place],  [place] [address]  Chief complaint:  Patient in for follow-up  HPI:   History of COPD, was last seen in 2022  Has not been using Trelegy recently She continues to smoke about half a pack a day  No significant cough but did have a cough a few weeks back  Activity level is currently maintained, states she can walk up to half a mile  Her weight has been stable  Consistently smoked about half a pack most of the time but, smoked up to a whole pack a day when she is stressed  She did have some improvement with the Trelegy but she gets anxious about using it, she feels she smokes more thinking about her lung disease  Not feeling acutely ill  Denies any chest pains or chest discomfort  She worked in retail, housekeeping in the past  No family history of lung disease   Outpatient Encounter Medications as of 06/05/2022  Medication Sig   albuterol (PROVENTIL) (2.5 MG/3ML) 0.083% nebulizer solution Take 3 mLs (2.5 mg total) by nebulization every 6 (six) hours as needed for wheezing or shortness of breath.   albuterol (VENTOLIN HFA) 108 (90 Base) MCG/ACT inhaler Inhale 2 puffs into the lungs every 6 (six) hours as needed for wheezing or shortness of breath.   amLODipine (NORVASC) 10 MG tablet TAKE 1 TABLET EVERY DAY   aspirin EC 81 MG tablet Take 1 tablet (81 mg total) by mouth daily.   Blood Glucose Monitoring Suppl (TRUE METRIX METER) w/Device KIT Use as directed   cetirizine (ZYRTEC) 10 MG tablet Take 1 tablet (10 mg total) by mouth daily.   clotrimazole-betamethasone (LOTRISONE) cream APPLY CREAM TOPICALLY TO AFFECTED AREA TWICE DAILY   cromolyn (OPTICROM) 4 % ophthalmic solution Place 1 drop into both eyes 4 (four) times daily. (Patient not taking: Reported on 07/20/2021)    Fluticasone-Umeclidin-Vilant (TRELEGY ELLIPTA) 100-62.5-25 MCG/ACT AEPB Inhale 1 puff into the lungs daily.   glucose blood (TRUE METRIX BLOOD GLUCOSE TEST) test strip Use to check blood sugar once daily.   LINZESS 145 MCG CAPS capsule TAKE 1 CAPSULE EVERY DAY BEFORE BREAKFAST   lisinopril-hydrochlorothiazide (ZESTORETIC) 20-25 MG tablet TAKE 1 TABLET EVERY DAY   lovastatin (MEVACOR) 40 MG tablet Take 1 tablet (40 mg total) by mouth at bedtime.   metFORMIN (GLUCOPHAGE) 500 MG tablet TAKE 1/2 TABLET ONCE DAILY WITH BREAKFAST   nicotine polacrilex (NICORETTE) 2 MG gum Take 1 each (2 mg total) by mouth as needed for smoking cessation. (Patient not taking: Reported on 04/25/2022)   pantoprazole (PROTONIX) 40 MG tablet Take 1 tablet (40 mg total) by mouth daily.   SV IRON 325 (65 Fe) MG tablet TAKE 1 TABLET BY MOUTH IN THE MORNING AND 1 AT BEDTIME   TRUEplus Lancets 28G MISC Use as directed   No facility-administered encounter medications on file as of 06/05/2022.    Allergies as of 06/05/2022 - Review Complete 04/25/2022  Allergen Reaction Noted   Penicillins Itching and Other (See Comments) 07/28/2011    Past Medical History:  Diagnosis Date   Abnormal facial hair    Back pain    d/t knee pain   Headache(784.0)    Heart murmur    slight   HTN (hypertension)    takes Amlodipine  daily and HCTZ    Hyperlipidemia    takes Lovastatin nightly   Joint pain    Right knee DJD    Weakness    left hand    Past Surgical History:  Procedure Laterality Date   ABDOMINAL HYSTERECTOMY     Athroscopic knee surgery     Bilateral   CARPAL TUNNEL RELEASE Right 05/02/2016   Procedure: CARPAL TUNNEL RELEASE;  Surgeon: Cammy Copa, MD;  Location: [facility] OR;  Service: Orthopedics;  Laterality: Right;   CHOLECYSTECTOMY     DILATION AND CURETTAGE OF UTERUS     EYE SURGERY Right    SHOULDER ARTHROSCOPY WITH SUBACROMIAL DECOMPRESSION Right 05/02/2016   Procedure: RIGHT SHOULDER ARTHROSCOPY WITH  SUBACROMIAL DECOMPRESSION, MINI OPEN ROTATOR CUFF TEAR REPAIR, AND RIGHT CARPAL TUNNEL RELEASE;  Surgeon: Cammy Copa, MD;  Location: [facility] OR;  Service: Orthopedics;  Laterality: Right;   TOTAL KNEE ARTHROPLASTY Right 07/15/2012   Procedure: RIGHT TOTAL KNEE ARTHROPLASTY;  Surgeon: Nilda Simmer, MD;  Location: [facility] OR;  Service: Orthopedics;  Laterality: Right;    Family History  Problem Relation Age of Onset   CAD Father 25   CAD Mother 39   Hypertension Mother     Social History   Socioeconomic History   Marital status: Legally Separated    Spouse name: Not on file   Number of children: 3   Years of education: Not on file   Highest education level: Not on file  Occupational History    Employer: [employer]  Tobacco Use   Smoking status: Every Day    Packs/day: .25    Types: Cigarettes    Last attempt to quit: 08/13/2020    Years since quitting: 1.8   Smokeless tobacco: Never   Tobacco comments:    Started smoking  Substance and Sexual Activity   Alcohol use: Yes    Alcohol/week: 0.0 standard drinks of alcohol    Comment: ocassionally   Drug use: No   Sexual activity: Never  Other Topics Concern   Not on file  Social History Narrative   Lives by herself now.    Social Determinants of Health   Financial Resource Strain: Not on file  Food Insecurity: Not on file  Transportation Needs: Not on file  Physical Activity: Not on file  Stress: Not on file  Social Connections: Not on file  Intimate Partner Violence: Not on file    Review of Systems  Constitutional: Negative.   HENT: Negative.    Eyes: Negative.   Respiratory:  Positive for cough and shortness of breath.   Cardiovascular: Negative.   Gastrointestinal: Negative.   Genitourinary: Negative.   Musculoskeletal: Negative.     There were no vitals filed for this visit.    Physical Exam Constitutional:      Appearance: Normal appearance.  HENT:     Head: Normocephalic and atraumatic.     Nose:  No congestion.  Eyes:     General: No scleral icterus.    Pupils: Pupils are equal, round, and reactive to light.  Cardiovascular:     Rate and Rhythm: Normal rate and regular rhythm.     Pulses: Normal pulses.     Heart sounds: Normal heart sounds. No murmur heard.    No friction rub.  Pulmonary:     Effort: Pulmonary effort is normal. No respiratory distress.     Breath sounds: No stridor. No wheezing or rhonchi.  Musculoskeletal:     Cervical back: No rigidity  or tenderness.  Neurological:     Mental Status: She is alert.  Psychiatric:        Mood and Affect: Mood normal.    Data Reviewed: Echocardiogram 06/06/2019: Ejection fraction of 65 to 70%, dilated atria no mention of pulmonary hypertension Pulmonary function test 08/21/2019 significant for mild obstructive disease with no significant bronchodilator response  Assessment:   Chronic obstructive pulmonary disease -Stage I COPD -Will reinitiate Trelegy -Albuterol as needed  Inhaler technique was reviewed today  An active smoker -Smoking cessation counseling provided  Risk with progressive disease discussed if she continues to smoke   Plan/Recommendations:  -Continue Trelegy 100  Continue albuterol as needed  -Smoking cessation counseling  Follow-up in about 2 months  Prescription for albuterol sent to pharmacy  Will obtain a low-dose CT scan of the chest, patient has over 20-pack-year smoking history  Schedule for pulmonary function test to assess any significant progression  Encourage graded activities as tolerated    Virl Diamond MD [facility] 06/05/2022, 9:41 AM  CC: Marcine Matar, MD

## 2022-06-06 ENCOUNTER — Other Ambulatory Visit: Payer: Self-pay | Admitting: Internal Medicine

## 2022-06-06 DIAGNOSIS — Z1231 Encounter for screening mammogram for malignant neoplasm of breast: Secondary | ICD-10-CM

## 2022-06-07 DIAGNOSIS — H04123 Dry eye syndrome of bilateral lacrimal glands: Secondary | ICD-10-CM | POA: Diagnosis not present

## 2022-06-07 DIAGNOSIS — R7309 Other abnormal glucose: Secondary | ICD-10-CM | POA: Diagnosis not present

## 2022-06-07 DIAGNOSIS — H2513 Age-related nuclear cataract, bilateral: Secondary | ICD-10-CM | POA: Diagnosis not present

## 2022-06-07 DIAGNOSIS — H10413 Chronic giant papillary conjunctivitis, bilateral: Secondary | ICD-10-CM | POA: Diagnosis not present

## 2022-06-07 DIAGNOSIS — H40013 Open angle with borderline findings, low risk, bilateral: Secondary | ICD-10-CM | POA: Diagnosis not present

## 2022-06-07 LAB — HM DIABETES EYE EXAM

## 2022-06-23 ENCOUNTER — Ambulatory Visit
Admission: RE | Admit: 2022-06-23 | Discharge: 2022-06-23 | Disposition: A | Discharge status: Home / Self Care | Payer: Medicare HMO | Source: Ambulatory Visit | Attending: Internal Medicine | Admitting: Internal Medicine

## 2022-06-23 DIAGNOSIS — Z1231 Encounter for screening mammogram for malignant neoplasm of breast: Secondary | ICD-10-CM

## 2022-06-28 ENCOUNTER — Other Ambulatory Visit: Payer: Self-pay | Admitting: Internal Medicine

## 2022-06-28 DIAGNOSIS — R928 Other abnormal and inconclusive findings on diagnostic imaging of breast: Secondary | ICD-10-CM

## 2022-07-03 ENCOUNTER — Telehealth: Payer: Self-pay

## 2022-07-03 ENCOUNTER — Other Ambulatory Visit: Payer: Self-pay | Admitting: Internal Medicine

## 2022-07-03 NOTE — Patient Outreach (Signed)
  Care Coordination   07/03/2022 Name: Alison Ford MRN: [id number] DOB: Aug 14, 1955   Care Coordination Outreach Attempts:  An unsuccessful telephone outreach was attempted today to offer the patient information about available care coordination services.  Follow Up Plan:  Additional outreach attempts will be made to offer the patient care coordination information and services.   Encounter Outcome:  No Answer   Care Coordination Interventions:  No, not indicated    Bevelyn Ngo, BSW, CDP Social Worker, Certified Dementia Practitioner [facility]  Care Coordination [phone]

## 2022-07-05 ENCOUNTER — Encounter: Payer: Self-pay | Admitting: Pulmonary Disease

## 2022-07-06 ENCOUNTER — Ambulatory Visit
Admission: RE | Admit: 2022-07-06 | Discharge: 2022-07-06 | Disposition: A | Discharge status: Home / Self Care | Payer: Medicare HMO | Source: Ambulatory Visit | Attending: Pulmonary Disease | Admitting: Pulmonary Disease

## 2022-07-06 DIAGNOSIS — F17210 Nicotine dependence, cigarettes, uncomplicated: Secondary | ICD-10-CM | POA: Diagnosis not present

## 2022-07-06 DIAGNOSIS — J4489 Other specified chronic obstructive pulmonary disease: Secondary | ICD-10-CM

## 2022-07-07 ENCOUNTER — Ambulatory Visit
Admission: RE | Admit: 2022-07-07 | Discharge: 2022-07-07 | Disposition: A | Discharge status: Home / Self Care | Payer: Medicare HMO | Source: Ambulatory Visit | Attending: Internal Medicine | Admitting: Internal Medicine

## 2022-07-07 DIAGNOSIS — R928 Other abnormal and inconclusive findings on diagnostic imaging of breast: Secondary | ICD-10-CM

## 2022-07-07 DIAGNOSIS — N6001 Solitary cyst of right breast: Secondary | ICD-10-CM | POA: Diagnosis not present

## 2022-07-07 DIAGNOSIS — N6311 Unspecified lump in the right breast, upper outer quadrant: Secondary | ICD-10-CM | POA: Diagnosis not present

## 2022-07-10 ENCOUNTER — Telehealth: Payer: Self-pay

## 2022-07-10 NOTE — Patient Outreach (Signed)
  Care Coordination   07/10/2022 Name: Alison Ford MRN: [id number] DOB: [birth year]   Care Coordination Outreach Attempts:  A second unsuccessful outreach was attempted today to offer the patient with information about available care coordination services.  Follow Up Plan:  Additional outreach attempts will be made to offer the patient care coordination information and services.   Encounter Outcome:  No Answer   Care Coordination Interventions:  No, not indicated    Bevelyn Ngo, BSW, CDP Social Worker, Certified Dementia Practitioner [facility] Care Management  Care Coordination [phone]

## 2022-07-18 ENCOUNTER — Telehealth: Payer: Self-pay

## 2022-07-18 NOTE — Patient Outreach (Signed)
  Care Coordination   07/18/2022 Name: Alison Ford MRN: [id number] DOB: 01/18/1955   Care Coordination Outreach Attempts:  A third unsuccessful outreach was attempted today to offer the patient with information about available care coordination services.  Follow Up Plan:  No further outreach attempts will be made at this time. We have been unable to contact the patient to offer or enroll patient in care coordination services  Encounter Outcome:  No Answer   Care Coordination Interventions:  No, not indicated    Bevelyn Ngo, BSW, CDP Social Worker, Certified Dementia Practitioner [facility] Care Management  Care Coordination [phone]

## 2022-07-19 ENCOUNTER — Ambulatory Visit: Payer: Self-pay

## 2022-07-19 NOTE — Patient Instructions (Signed)
Visit Information  Thank you for taking time to visit with me today. Please don't hesitate to contact me if I can be of assistance to you.   Following are the goals we discussed today:  -Contact your primary care provider as needed  If you are experiencing a Mental Health or Behavioral Health Crisis or need someone to talk to, please go to [facility] [address], [place] ([phone]) call [phone]  The patient verbalized understanding of instructions, educational materials, and care plan provided today and DECLINED offer to receive copy of patient instructions, educational materials, and care plan.   No further follow up required: Please contact the care coordination team as needed  Olita Takeshita, BSW, CDP Social Worker, Certified Dementia Practitioner [facility] Care Management  Care Coordination [phone]          

## 2022-07-19 NOTE — Patient Outreach (Signed)
  Care Coordination   Initial Visit Note   07/19/2022 Name: Alison Ford MRN: [id number] DOB: Aug 11, 1955  Alison Ford is a 67 y.o. year old female who sees Marcine Matar, MD for primary care. I spoke with  Alison Ford by phone today.  What matters to the patients health and wellness today?  No concerns at this time, patient reports she is doing well.    Goals Addressed             This Visit's Progress    COMPLETED: Care Coordination Activities       Care Coordination Interventions: [facility] screening reviewed - no acute resource needs identified at this time Determined the patient does not have concerns with medications costs and/or adherence at this time Education provided on the role of the care coordination team - no follow up desired at this time Provided contact numbers to the care coordination team should the patient need assistance in the future Encouraged the patient to contact her primary care providers office as needed         SDOH assessments and interventions completed:  Yes  SDOH Interventions Today    Flowsheet Row Most Recent Value  SDOH Interventions   Food Insecurity Interventions Intervention Not Indicated  Housing Interventions Intervention Not Indicated  Transportation Interventions Intervention Not Indicated        Care Coordination Interventions:  Yes, provided   Interventions Today    Flowsheet Row Most Recent Value  Chronic Disease   Chronic disease during today's visit Diabetes  General Interventions   General Interventions Discussed/Reviewed General Interventions Discussed, Doctor Visits  Doctor Visits Discussed/Reviewed Doctor Visits Reviewed  Education Interventions   Education Provided Provided Education  [Role of the care coordination team]        Follow up plan: No further intervention required.   Encounter Outcome:  Pt. Visit Completed   Bevelyn Ngo, BSW, CDP Social Worker, Certified Dementia  Practitioner [facility] Care Management  Care Coordination [phone]

## 2022-08-07 ENCOUNTER — Ambulatory Visit (INDEPENDENT_AMBULATORY_CARE_PROVIDER_SITE_OTHER): Payer: Medicare HMO | Admitting: Pulmonary Disease

## 2022-08-07 DIAGNOSIS — J4489 Other specified chronic obstructive pulmonary disease: Secondary | ICD-10-CM

## 2022-08-07 DIAGNOSIS — J439 Emphysema, unspecified: Secondary | ICD-10-CM | POA: Diagnosis not present

## 2022-08-07 NOTE — Patient Instructions (Signed)
Full PFT performed today. °

## 2022-08-07 NOTE — Progress Notes (Signed)
Full PFT performed today. °

## 2022-08-08 ENCOUNTER — Telehealth: Payer: Self-pay | Admitting: Pulmonary Disease

## 2022-08-08 NOTE — Telephone Encounter (Signed)
Pt called in for PFT results.

## 2022-08-09 ENCOUNTER — Other Ambulatory Visit: Payer: Self-pay | Admitting: Pulmonary Disease

## 2022-08-09 ENCOUNTER — Encounter: Payer: Self-pay | Admitting: Orthopedic Surgery

## 2022-08-09 ENCOUNTER — Ambulatory Visit (INDEPENDENT_AMBULATORY_CARE_PROVIDER_SITE_OTHER): Payer: Medicare HMO | Admitting: Orthopedic Surgery

## 2022-08-09 DIAGNOSIS — M25561 Pain in right knee: Secondary | ICD-10-CM

## 2022-08-09 DIAGNOSIS — G8929 Other chronic pain: Secondary | ICD-10-CM

## 2022-08-09 LAB — PULMONARY FUNCTION TEST
DL/VA % pred: 72 %
DL/VA: 2.92 ml/min/mmHg/L
DLCO cor % pred: 43 %
DLCO cor: 9.71 ml/min/mmHg
DLCO unc % pred: 43 %
FEF 25-75 Post: 0.87 L/sec
FEF 25-75 Pre: 0.72 L/sec
FEF2575-%Change-Post: 21 %
FEF2575-%Pred-Post: 38 %
FEF2575-%Pred-Pre: 31 %
FEV1-%Change-Post: 6 %
FEV1-%Pred-Post: 50 %
FEV1-%Pred-Pre: 47 %
FEV1-Post: 1.37 L
FEV1-Pre: 1.28 L
FEV1FVC-%Change-Post: 0 %
FEV1FVC-%Pred-Pre: 82 %
FEV6-%Change-Post: 7 %
FEV6-%Pred-Post: 63 %
FEV6-%Pred-Pre: 58 %
FEV6-Post: 2.15 L
FEV6-Pre: 1.99 L
FEV6FVC-%Change-Post: 0 %
FEV6FVC-%Pred-Post: 103 %
FEV6FVC-%Pred-Pre: 103 %
FVC-%Pred-Post: 61 %
FVC-%Pred-Pre: 56 %
FVC-Post: 2.17 L
Post FEV1/FVC ratio: 63 %
Post FEV6/FVC ratio: 99 %
Pre FEV1/FVC ratio: 63 %
Pre FEV6/FVC Ratio: 99 %
RV % pred: 109 %
RV: 2.51 L
TLC % pred: 81 %
TLC: 4.53 L

## 2022-08-09 NOTE — Progress Notes (Signed)
Office Visit Note   Patient: Alison Ford           Date of Birth: [birth year]           MRN: [id number] Visit Date: 08/09/2022 Requested by: Marcine Matar, MD [address] [place],  [place] [address] PCP: Marcine Matar, MD  Subjective: Chief Complaint  Patient presents with   Other     Wants to discuss needs for motorized wheelchair     HPI: Alison Ford is a 67 y.o. female who presents to the office reporting general diminishing ability to get around.  She is here today inquiring about motorized wheelchair/scooter.  She does use a cane for ambulation.  Had right total knee replacement over 5 years ago.Marland Kitchen  Also had right shoulder rotator cuff surgery in 2018.  Has never had back surgery.  Does have a history of having physical therapy and injections in the past.              ROS: All systems reviewed are negative as they relate to the chief complaint within the history of present illness.  Patient denies fevers or chills.  Assessment & Plan: Visit Diagnoses:  1. Chronic pain of right knee     Plan: Impression is patient wants to get a motorized wheelchair and scooter.  Overall her upper extremity strength as well as lower extremity strength is pretty reasonable.  You can see the physical exam section for that part.  No real axial spine issues.  We will let the company obtain medical records and decide if they want to pursue motorized wheelchair prescription but in my experience with patients who have tried this before I think that she does not really have the limitation of upper extremity strength in order to meet the requirements for motorized wheelchair.  Follow-Up Instructions: No follow-ups on file.   Orders:  No orders of the defined types were placed in this encounter.  No orders of the defined types were placed in this encounter.     Procedures: No procedures performed   Clinical Data: No additional findings.  Objective: Vital Signs:  There were no vitals taken for this visit.  Physical Exam:  Constitutional: Patient appears well-developed HEENT:  Head: Normocephalic Eyes:EOM are normal Neck: Normal range of motion Cardiovascular: Normal rate Pulmonary/chest: Effort normal Neurologic: Patient is alert Skin: Skin is warm Psychiatric: Patient has normal mood and affect  Ortho Exam: Ortho exam demonstrates good cervical spine range of motion.  Patient has active forward flexion and abduction 180 and 120 respectively.  Rotator cuff strength is 5 out of 5 external rotation bilaterally 5+ out of 5 internal rotation subscap strength bilaterally.  Not too much coarseness or grinding with internal/external rotation of either shoulder.  Abduction strength 5- out of 5 on the right 5+ out of 5 on the left.  Patient has 4+ out of 5 grip strength bilaterally.  Biceps triceps deltoid strength also 5+ out of 5.  Wrist flexion extension strength 5 out of 5.  Radial pulse intact bilaterally.  Bilateral lower extremities demonstrates full extension bilaterally flexion past 90 easily in both knees.  Collaterals are stable in both knees.  No effusion in the right knee or left knee.  Patient has 5- out of 5 ankle dorsiflexion plantarflexion strength at 5+ out of 5 extension strength of the knees flexion strength of the knees and hip flexion strength with abduction and adduction also 5 out of 5.  Specialty Comments:  No specialty comments available.  Imaging: No results found.   PMFS History: Patient Active Problem List   Diagnosis Date Noted   Left adrenal mass (HCC) 04/25/2022   Chronic respiratory failure with hypoxia, on home oxygen therapy (HCC) 04/25/2022   Angiodysplasia of stomach and duodenum without bleeding 12/14/2021   COVID-19 12/28/2020   Class 1 obesity due to excess calories with serious comorbidity and body mass index (BMI) of 31.0 to 31.9 in adult 05/15/2019   Prediabetes 05/15/2019   Iron deficiency anemia 05/15/2019    Systolic ejection murmur 05/15/2019   Hypoxemia requiring supplemental oxygen 05/15/2019   Normocytic anemia 04/29/2019   Chronic obstructive pulmonary disease (HCC) 05/30/2016   Rotator cuff tear 05/02/2016   Rotator cuff tendonitis, right 12/30/2015   Cervicalgia 12/30/2015   Eczema, dyshidrotic 11/18/2014   Acne 11/18/2014   Smoker 06/01/2014   Right knee DJD    Hyperlipidemia    HTN (hypertension) 06/05/2012   Past Medical History:  Diagnosis Date   Abnormal facial hair    Back pain    d/t knee pain   Headache(784.0)    Heart murmur    slight   HTN (hypertension)    takes Amlodipine daily and HCTZ    Hyperlipidemia    takes Lovastatin nightly   Joint pain    Right knee DJD    Weakness    left hand    Family History  Problem Relation Age of Onset   CAD Father 64   CAD Mother 62   Hypertension Mother     Past Surgical History:  Procedure Laterality Date   ABDOMINAL HYSTERECTOMY     Athroscopic knee surgery     Bilateral   BREAST BIOPSY Right 03/2017   CARPAL TUNNEL RELEASE Right 05/02/2016   Procedure: CARPAL TUNNEL RELEASE;  Surgeon: Cammy Copa, MD;  Location: [facility] OR;  Service: Orthopedics;  Laterality: Right;   CHOLECYSTECTOMY     DILATION AND CURETTAGE OF UTERUS     EYE SURGERY Right    SHOULDER ARTHROSCOPY WITH SUBACROMIAL DECOMPRESSION Right 05/02/2016   Procedure: RIGHT SHOULDER ARTHROSCOPY WITH SUBACROMIAL DECOMPRESSION, MINI OPEN ROTATOR CUFF TEAR REPAIR, AND RIGHT CARPAL TUNNEL RELEASE;  Surgeon: Cammy Copa, MD;  Location: [facility] OR;  Service: Orthopedics;  Laterality: Right;   TOTAL KNEE ARTHROPLASTY Right 07/15/2012   Procedure: RIGHT TOTAL KNEE ARTHROPLASTY;  Surgeon: Nilda Simmer, MD;  Location: [facility] OR;  Service: Orthopedics;  Laterality: Right;   Social History   Occupational History    Employer: [id number]  Tobacco Use   Smoking status: Every Day    Current packs/day: 0.00    Types: Cigarettes    Last attempt to quit:  08/13/2020    Years since quitting: 1.9   Smokeless tobacco: Never   Tobacco comments:    Started smoking  Substance and Sexual Activity   Alcohol use: Yes    Alcohol/week: 0.0 standard drinks of alcohol    Comment: ocassionally   Drug use: No   Sexual activity: Never

## 2022-08-11 DIAGNOSIS — D509 Iron deficiency anemia, unspecified: Secondary | ICD-10-CM | POA: Diagnosis not present

## 2022-08-11 NOTE — Telephone Encounter (Signed)
Patient waiting for response, please advise.

## 2022-08-16 ENCOUNTER — Other Ambulatory Visit: Payer: Self-pay | Admitting: Internal Medicine

## 2022-08-16 NOTE — Telephone Encounter (Signed)
Dr. Val Eagle can you please advise on PFT results?

## 2022-08-22 MED ORDER — TRELEGY ELLIPTA 100-62.5-25 MCG/ACT IN AEPB: 1.0000 | INHALATION_SPRAY | Freq: Every day | RESPIRATORY_TRACT | 3 refills | Status: DC

## 2022-08-22 NOTE — Telephone Encounter (Signed)
Called and spoke with patient.  Dr. Trena Platt results and recommendations given. Understanding stated.  Patient requested a refill of Trelegy to be sent to [facility] mail order.  Prescription sent. Nothing further at this time.

## 2022-08-22 NOTE — Telephone Encounter (Signed)
PFT shows severe obstructive disease, consistent with having advanced chronic obstructive pulmonary disease

## 2022-08-23 ENCOUNTER — Telehealth: Payer: Self-pay | Admitting: Pulmonary Disease

## 2022-08-23 DIAGNOSIS — J449 Chronic obstructive pulmonary disease, unspecified: Secondary | ICD-10-CM

## 2022-08-25 MED ORDER — ALBUTEROL SULFATE (2.5 MG/3ML) 0.083% IN NEBU: 2.5000 mg | INHALATION_SOLUTION | Freq: Four times a day (QID) | RESPIRATORY_TRACT | 2 refills | Status: AC | PRN

## 2022-08-25 NOTE — Telephone Encounter (Signed)
Pt calling back for albuterol (PROVENTIL) (2.5 MG/3ML) 0.083% nebulizer solution

## 2022-08-25 NOTE — Telephone Encounter (Signed)
Refill for albuterol (PROVENTIL) (2.5 MG/3ML) 0.083% nebulizer solution was sent to the pharmacy and pt was informed. NFN

## 2022-08-28 ENCOUNTER — Ambulatory Visit: Payer: Medicare HMO | Attending: Internal Medicine | Admitting: Internal Medicine

## 2022-08-28 ENCOUNTER — Encounter: Payer: Self-pay | Admitting: Internal Medicine

## 2022-08-28 ENCOUNTER — Ambulatory Visit: Payer: Medicare HMO | Admitting: Pulmonary Disease

## 2022-08-28 VITALS — BP 121/73 | HR 70 | Temp 98.5°F | Ht 67.0 in | Wt 183.0 lb

## 2022-08-28 DIAGNOSIS — J449 Chronic obstructive pulmonary disease, unspecified: Secondary | ICD-10-CM | POA: Diagnosis not present

## 2022-08-28 DIAGNOSIS — B353 Tinea pedis: Secondary | ICD-10-CM

## 2022-08-28 DIAGNOSIS — I10 Essential (primary) hypertension: Secondary | ICD-10-CM

## 2022-08-28 DIAGNOSIS — R21 Rash and other nonspecific skin eruption: Secondary | ICD-10-CM

## 2022-08-28 DIAGNOSIS — R7303 Prediabetes: Secondary | ICD-10-CM

## 2022-08-28 DIAGNOSIS — E785 Hyperlipidemia, unspecified: Secondary | ICD-10-CM

## 2022-08-28 DIAGNOSIS — Z9981 Dependence on supplemental oxygen: Secondary | ICD-10-CM

## 2022-08-28 DIAGNOSIS — F17200 Nicotine dependence, unspecified, uncomplicated: Secondary | ICD-10-CM

## 2022-08-28 DIAGNOSIS — D508 Other iron deficiency anemias: Secondary | ICD-10-CM | POA: Diagnosis not present

## 2022-08-28 DIAGNOSIS — J9611 Chronic respiratory failure with hypoxia: Secondary | ICD-10-CM

## 2022-08-28 MED ORDER — CLOTRIMAZOLE-BETAMETHASONE 1-0.05 % EX CREA: TOPICAL_CREAM | CUTANEOUS | 0 refills | Status: DC

## 2022-08-28 MED ORDER — TRUEPLUS LANCETS 28G MISC
4 refills | Status: AC
Start: 2022-08-28 — End: ?

## 2022-08-28 MED ORDER — TRUE METRIX BLOOD GLUCOSE TEST VI STRP: ORAL_STRIP | 4 refills | Status: DC

## 2022-08-28 NOTE — Progress Notes (Signed)
Patient ID: Alison Ford, female    DOB: 12/31/1955  MRN: [id number]  CC: Hypertension (HTN & hyperlipidemia f/u. Herold Harms a rx for home o2 tank - [facility] informed pt that she is unable to receive either tank/Bumps on L hand, itchy X1 week)   Subjective: Alison Ford is a 67 y.o. female who presents for chronic disease management Her concerns today include:  Pt with hx of  HTN, preDM, HL, tob dep, COPD stage 1 (Dr. Wynona Neat), on home O2, IDA, Vit D def and s/p RT rotator cuff repair and RT CTS release, LT OA knee, chronic constipation, IBS, history of colon polyps.   COPD/chronic hypoxia:  changed from [facility] to [facility].  Met criteria for 2 Lt O2 with ambulation.  [facility] picked up their equipment 1.5 wks ago and has been without O2 since then. [facility] told her she needs rxn for both the portable and home concentrator not just the portable which I had ordered.  Should be on Trelegy inhaler and Albuterol inhaler.  Using the Albuterol inhaler 2x a day.  Needs new nebulizer machine because the mouth piece is broken on her current one -no cough at this time Still smoking but has set quit date for 09/11/2022. Expecting patches from 1[employer]  HTN: Should be on amlodipine 10 mg daily and lisinopril/hydrochlorothiazide 20/25 mg 1 tablet daily. Reports compliance with taking medicines. Limits salt.  No CP  HL/prediabetes: Reports compliance with taking metformin 500 mg half a tablet daily and Mevacor 40 mg daily.  Last LDL was 109 in April  IDA:  last Hb 13.5 up from 9.1.  Saw [facility] 3 wks ago.  Had blood test.  Told to dec Iron to 3x/wk which is what she has been doing  HM: has copy of vaccine report from [facility].  She has had 2 Twinrix vaccines so far and will be due for the third 1 in October.  She also recently received a flu shot and Boostrix on 08/25/2022. Patient Active Problem List   Diagnosis Date Noted   Left adrenal mass (HCC) 04/25/2022   Chronic respiratory failure  with hypoxia, on home oxygen therapy (HCC) 04/25/2022   Angiodysplasia of stomach and duodenum without bleeding 12/14/2021   COVID-19 12/28/2020   Class 1 obesity due to excess calories with serious comorbidity and body mass index (BMI) of 31.0 to 31.9 in adult 05/15/2019   Prediabetes 05/15/2019   Iron deficiency anemia 05/15/2019   Systolic ejection murmur 05/15/2019   Hypoxemia requiring supplemental oxygen 05/15/2019   Normocytic anemia 04/29/2019   Chronic obstructive pulmonary disease (HCC) 05/30/2016   Rotator cuff tear 05/02/2016   Rotator cuff tendonitis, right 12/30/2015   Cervicalgia 12/30/2015   Eczema, dyshidrotic 11/18/2014   Acne 11/18/2014   Smoker 06/01/2014   Right knee DJD    Hyperlipidemia    HTN (hypertension) 06/05/2012     Current Outpatient Medications on File Prior to Visit  Medication Sig Dispense Refill   albuterol (PROVENTIL) (2.5 MG/3ML) 0.083% nebulizer solution Take 3 mLs (2.5 mg total) by nebulization every 6 (six) hours as needed for wheezing or shortness of breath. 150 mL 2   albuterol (VENTOLIN HFA) 108 (90 Base) MCG/ACT inhaler Inhale 2 puffs into the lungs every 6 (six) hours as needed for wheezing or shortness of breath. 18 g 5   albuterol (VENTOLIN HFA) 108 (90 Base) MCG/ACT inhaler Inhale 2 puffs into the lungs every 6 (six) hours as needed for wheezing or shortness of  breath. 8 g 6   amLODipine (NORVASC) 10 MG tablet TAKE 1 TABLET EVERY DAY 90 tablet 0   aspirin EC 81 MG tablet Take 1 tablet (81 mg total) by mouth daily. 90 tablet 3   Blood Glucose Monitoring Suppl (TRUE METRIX METER) w/Device KIT Use as directed 1 kit 0   cromolyn (OPTICROM) 4 % ophthalmic solution Place 1 drop into both eyes 4 (four) times daily.  98   doxycycline (VIBRA-TABS) 100 MG tablet Take 1 tablet by mouth twice daily 14 tablet 0   ferrous sulfate (SV IRON) 325 (65 FE) MG tablet Take 1 tablet by mouth three times weekly. Dose decrease. 12 tablet 0    Fluticasone-Umeclidin-Vilant (TRELEGY ELLIPTA) 100-62.5-25 MCG/ACT AEPB Inhale 1 puff into the lungs daily. 1 each 11   Fluticasone-Umeclidin-Vilant (TRELEGY ELLIPTA) 100-62.5-25 MCG/ACT AEPB Inhale 1 puff into the lungs daily. 60 each 3   LINZESS 145 MCG CAPS capsule TAKE 1 CAPSULE EVERY DAY BEFORE BREAKFAST 90 capsule 1   lisinopril-hydrochlorothiazide (ZESTORETIC) 20-25 MG tablet TAKE 1 TABLET EVERY DAY 30 tablet 0   lovastatin (MEVACOR) 40 MG tablet Take 1 tablet (40 mg total) by mouth at bedtime. 90 tablet 2   metFORMIN (GLUCOPHAGE) 500 MG tablet TAKE 1/2 TABLET ONCE DAILY WITH BREAKFAST 45 tablet 0   nicotine polacrilex (NICORETTE) 2 MG gum Take 1 each (2 mg total) by mouth as needed for smoking cessation. 100 tablet 3   predniSONE (DELTASONE) 20 MG tablet Take 1 tablet by mouth once daily with breakfast 7 tablet 0   cetirizine (ZYRTEC) 10 MG tablet Take 1 tablet (10 mg total) by mouth daily. (Patient not taking: Reported on 08/28/2022) 30 tablet 6   pantoprazole (PROTONIX) 40 MG tablet Take 1 tablet (40 mg total) by mouth daily. 90 tablet 0   No current facility-administered medications on file prior to visit.    Allergies  Allergen Reactions   Penicillins Itching and Other (See Comments)    Burning and whelps Has patient had a PCN reaction causing immediate rash, facial/tongue/throat swelling, SOB or lightheadedness with hypotension: NO Has patient had a PCN reaction causing severe rash involving mucus membranes or skin necrosis: NO Has patient had a PCN reaction that required hospitalization: NO Has patient had a PCN reaction occurring within the last 10 years: NO If all of the above answers are "NO", then may proceed with Cephalosporin use.    Social History   Socioeconomic History   Marital status: Legally Separated    Spouse name: Not on file   Number of children: 3   Years of education: Not on file   Highest education level: Not on file  Occupational History    Employer:  [employer]  Tobacco Use   Smoking status: Every Day    Current packs/day: 0.00    Types: Cigarettes    Last attempt to quit: 08/13/2020    Years since quitting: 2.0   Smokeless tobacco: Never   Tobacco comments:    Started smoking  Substance and Sexual Activity   Alcohol use: Yes    Alcohol/week: 0.0 standard drinks of alcohol    Comment: ocassionally   Drug use: No   Sexual activity: Never  Other Topics Concern   Not on file  Social History Narrative   Lives by herself now.    Social Determinants of Health   Financial Resource Strain: Not on file  Food Insecurity: No Food Insecurity (07/19/2022)   Hunger Vital Sign    Worried About Running  Out of Food in the Last Year: Never true    Ran Out of Food in the Last Year: Never true  Transportation Needs: No Transportation Needs (07/19/2022)   PRAPARE - Administrator, Civil Service (Medical): No    Lack of Transportation (Non-Medical): No  Physical Activity: Not on file  Stress: Not on file  Social Connections: Not on file  Intimate Partner Violence: Not on file    Family History  Problem Relation Age of Onset   CAD Father 21   CAD Mother 30   Hypertension Mother     Past Surgical History:  Procedure Laterality Date   ABDOMINAL HYSTERECTOMY     Athroscopic knee surgery     Bilateral   BREAST BIOPSY Right 03/2017   CARPAL TUNNEL RELEASE Right 05/02/2016   Procedure: CARPAL TUNNEL RELEASE;  Surgeon: Cammy Copa, MD;  Location: [facility] OR;  Service: Orthopedics;  Laterality: Right;   CHOLECYSTECTOMY     DILATION AND CURETTAGE OF UTERUS     EYE SURGERY Right    SHOULDER ARTHROSCOPY WITH SUBACROMIAL DECOMPRESSION Right 05/02/2016   Procedure: RIGHT SHOULDER ARTHROSCOPY WITH SUBACROMIAL DECOMPRESSION, MINI OPEN ROTATOR CUFF TEAR REPAIR, AND RIGHT CARPAL TUNNEL RELEASE;  Surgeon: Cammy Copa, MD;  Location: [facility] OR;  Service: Orthopedics;  Laterality: Right;   TOTAL KNEE ARTHROPLASTY Right 07/15/2012    Procedure: RIGHT TOTAL KNEE ARTHROPLASTY;  Surgeon: Nilda Simmer, MD;  Location: [facility] OR;  Service: Orthopedics;  Laterality: Right;    ROS: Review of Systems Negative except as stated above  PHYSICAL EXAM: BP 121/73 (BP Location: Left Arm, Patient Position: Sitting, Cuff Size: Normal)   Pulse 70   Temp 98.5 F (36.9 C) (Oral)   Ht 5\' 7"  (1.702 m)   Wt 183 lb (83 kg)   SpO2 96%   BMI 28.66 kg/m   Physical Exam  General appearance - alert, well appearing, older AAM and in no distress Mental status - alert, oriented to person, place, and time Neck - supple, no significant adenopathy Chest -few scattered rhonchi. Heart -regular rate and rhythm.  2 out of 6 systolic ejection murmur left upper sternal border Extremities - peripheral pulses normal, no pedal edema, no clubbing or cyanosis Skin: Few small bumps on the thenar eminence of both hands.     Latest Ref Rng & Units 04/25/2022   12:00 PM 06/24/2021    4:51 PM 11/26/2019   10:51 AM  CMP  Glucose 70 - 99 mg/dL 284  132  99   BUN 8 - 27 mg/dL 13  17  13    Creatinine 0.57 - 1.00 mg/dL 4.40  1.02  7.25   Sodium 134 - 144 mmol/L 141  141  140   Potassium 3.5 - 5.2 mmol/L 4.2  4.3  4.6   Chloride 96 - 106 mmol/L 104  104  104   CO2 20 - 29 mmol/L 23  21  23    Calcium 8.7 - 10.3 mg/dL 36.6  44.0  34.7   Total Protein 6.0 - 8.5 g/dL 6.7  6.9  6.8   Total Bilirubin 0.0 - 1.2 mg/dL <4.2  <5.9  0.2   Alkaline Phos 44 - 121 IU/L 88  82  79   AST 0 - 40 IU/L 15  16  19    ALT 0 - 32 IU/L 17  13  18     Lipid Panel     Component Value Date/Time   CHOL 193 04/25/2022 1200  TRIG 140 04/25/2022 1200   HDL 60 04/25/2022 1200   CHOLHDL 3.2 04/25/2022 1200   CHOLHDL 4.0 03/17/2014 0922   VLDL 41 (H) 03/17/2014 0922   LDLCALC 109 (H) 04/25/2022 1200    CBC    Component Value Date/Time   WBC 5.6 04/25/2022 1200   WBC 7.0 05/07/2017 1422   RBC 4.34 04/25/2022 1200   RBC 4.03 05/07/2017 1422   HGB 13.5 04/25/2022 1200   HCT  40.8 04/25/2022 1200   PLT 296 04/25/2022 1200   MCV 94 04/25/2022 1200   MCH 31.1 04/25/2022 1200   MCH 30.5 05/07/2017 1422   MCHC 33.1 04/25/2022 1200   MCHC 34.4 05/07/2017 1422   RDW 11.0 (L) 04/25/2022 1200   LYMPHSABS 3,171 05/07/2017 1422   MONOABS 448 09/29/2015 1158   EOSABS 273 05/07/2017 1422   BASOSABS 42 05/07/2017 1422    ASSESSMENT AND PLAN: 1. Stage 1 mild COPD by GOLD classification (HCC) Continue Trelegy and albuterol inhalers. Will send prescription to [facility] for nebulizer machine. - For home use only DME Nebulizer machine  2. Chronic respiratory failure with hypoxia, on home oxygen therapy (HCC) I will send an updated prescription to [facility] for portable and home oxygen.  She needs to be on 2 L with ambulation. - For home use only DME Other see comment  3. Essential hypertension At goal.  Continue Norvasc and lisinopril/hydrochlorothiazide.  4. Tobacco dependence Strongly encouraged her to quit.  She is set a quit date.  She is awaiting nicotine patches through the 1 800 quit NOW program.  5. Hyperlipidemia, unspecified hyperlipidemia type Continue Mevacor.  6. Prediabetes Continue metformin.  Encourage healthy eating habits. - glucose blood (TRUE METRIX BLOOD GLUCOSE TEST) test strip; Use to check blood sugar once daily.  Dispense: 100 each; Refill: 4 - TRUEplus Lancets 28G MISC; Use as directed  Dispense: 100 each; Refill: 4  8. Other iron deficiency anemia She has cut back on iron supplement to 3 times a week.  9. Rash of both hands - clotrimazole-betamethasone (LOTRISONE) cream; APPLY CREAM TOPICALLY TO AFFECTED AREA TWICE DAILY  Dispense: 30 g; Refill: 0    Patient was given the opportunity to ask questions.  Patient verbalized understanding of the plan and was able to repeat key elements of the plan.   This documentation was completed using Paediatric nurse.  Any transcriptional errors are  unintentional.  Orders Placed This Encounter  Procedures   For home use only DME Nebulizer machine   For home use only DME Other see comment     Requested Prescriptions   Signed Prescriptions Disp Refills   clotrimazole-betamethasone (LOTRISONE) cream 30 g 0    Sig: APPLY CREAM TOPICALLY TO AFFECTED AREA TWICE DAILY   glucose blood (TRUE METRIX BLOOD GLUCOSE TEST) test strip 100 each 4    Sig: Use to check blood sugar once daily.   TRUEplus Lancets 28G MISC 100 each 4    Sig: Use as directed    Return in about 4 months (around 12/28/2022) for Medicare Wellness Visit in    wks with CMA.  Jonah Blue, MD, FACP

## 2022-09-01 ENCOUNTER — Other Ambulatory Visit: Payer: Self-pay | Admitting: Internal Medicine

## 2022-09-01 ENCOUNTER — Telehealth: Payer: Self-pay | Admitting: Internal Medicine

## 2022-09-01 DIAGNOSIS — J449 Chronic obstructive pulmonary disease, unspecified: Secondary | ICD-10-CM

## 2022-09-01 DIAGNOSIS — J9611 Chronic respiratory failure with hypoxia: Secondary | ICD-10-CM

## 2022-09-01 NOTE — Telephone Encounter (Signed)
[place] with [facility] called saying they will be the provider for her oxygen.  She has changed insurances and [facility] has picked up her oxygen at her home.  She has been with out oxygen for a week and half/  [facility] said they sent over an order but they need more information.   Saturation levels   They said they also need information for nebulizer and supply kit.  CB# [phone]  FAX  [phone]

## 2022-09-01 NOTE — Telephone Encounter (Signed)
Pt is calling to follow up requesting appt. Pt was advised by [facility] needing script for portable oxygen and needing more information such as saturation levels. Pt reports that she is frustrated as she has been going through this process for 7-8 months. Pt reports that she will call back on Monday. Unsure if she needs an appt or not. Please advise  CB- [phone]

## 2022-09-04 NOTE — Telephone Encounter (Signed)
Called but no answer. LVM to call back.  Forms successfully faxed to [facility] on 09/01/2022.

## 2022-09-04 NOTE — Telephone Encounter (Signed)
Pt called and wants to receive follow up on request   Best contact: [phone]

## 2022-09-04 NOTE — Telephone Encounter (Signed)
Noted  

## 2022-09-06 NOTE — Telephone Encounter (Signed)
Called but no answer. LVM to call back.  

## 2022-09-06 NOTE — Addendum Note (Signed)
Addended by: Jonah Blue B on: 09/06/2022 01:01 PM   Modules accepted: Orders

## 2022-09-06 NOTE — Telephone Encounter (Signed)
Called but no answer. Please schedule an office visit for this patient.

## 2022-09-06 NOTE — Telephone Encounter (Signed)
Pt called and said she got a call to schedule a breathing test.  Pt would like a call back.  Pt states she is knows you are busy and will wait fr your call.

## 2022-09-07 ENCOUNTER — Ambulatory Visit: Payer: Self-pay | Admitting: *Deleted

## 2022-09-07 NOTE — Telephone Encounter (Signed)
  Chief Complaint: Shortness of breath, requesting appt for new prescription for oxygen needs "walk test" ordered Symptoms: shortness of breath at times. Wears oxygen 2 L/min [place] at times. Patient did not want to answer all questions regarding SOB sx just wants appt for "walk test"   Frequency: na Pertinent Negatives: Patient denies chest pain and did not report worsening difficulty breathing  Disposition: [] ED /[] Urgent Care (no appt availability in office) / [x] Appointment(In office/virtual)/ []  [facility]/ [] Home Care/ [] Refused Recommended Disposition /[] [facility]/ []  Follow-up with PCP Additional Notes:   Please advise. Patient upset regarding triage questions for SOB. Patient only want appt for "walk test" needed for new prescription for oxygen. Please advise. Earliest appt 09/21/22. Patient reports "me Dr knows what I need"     Reason for Disposition  [1] MILD longstanding difficulty breathing AND [2]  SAME as normal  Answer Assessment - Initial Assessment Questions 1. RESPIRATORY STATUS: "Describe your breathing?" (e.g., wheezing, shortness of breath, unable to speak, severe coughing)      Shortness of breath at times requesting new Rx for oxygen. Needs walk test 2. ONSET: "When did this breathing problem begin?"      On going  3. PATTERN "Does the difficult breathing come and go, or has it been constant since it started?"      Na  4. SEVERITY: "How bad is your breathing?" (e.g., mild, moderate, severe)    - MILD: No SOB at rest, mild SOB with walking, speaks normally in sentences, can lie down, no retractions, pulse < 100.    - MODERATE: SOB at rest, SOB with minimal exertion and prefers to sit, cannot lie down flat, speaks in phrases, mild retractions, audible wheezing, pulse 100-120.    - SEVERE: Very SOB at rest, speaks in single words, struggling to breathe, sitting hunched forward, retractions, pulse > 120      Did not answer 5. RECURRENT SYMPTOM: "Have  you had difficulty breathing before?" If Yes, ask: "When was the last time?" and "What happened that time?"      Yes  6. CARDIAC HISTORY: "Do you have any history of heart disease?" (e.g., heart attack, angina, bypass surgery, angioplasty)      See hx  7. LUNG HISTORY: "Do you have any history of lung disease?"  (e.g., pulmonary embolus, asthma, emphysema)     See hx wears oxygen 2 liters/min [place] at times  8. CAUSE: "What do you think is causing the breathing problem?"      Needs new oxygen tank 9. OTHER SYMPTOMS: "Do you have any other symptoms? (e.g., dizziness, runny nose, cough, chest pain, fever)     Shortness of breath at times. Denies issues now  10. O2 SATURATION MONITOR:  "Do you use an oxygen saturation monitor (pulse oximeter) at home?" If Yes, ask: "What is your reading (oxygen level) today?" "What is your usual oxygen saturation reading?" (e.g., 95%)       na 11. PREGNANCY: "Is there any chance you are pregnant?" "When was your last menstrual period?"       na 12. TRAVEL: "Have you traveled out of the country in the last month?" (e.g., travel history, exposures)       na  Protocols used: Breathing Difficulty-A-AH

## 2022-09-07 NOTE — Telephone Encounter (Signed)
Patient schedule a nurse visit for a walk test on 09/08/2022.

## 2022-09-07 NOTE — Telephone Encounter (Signed)
I keep receiving multiple messages in regards to this patient.  Received a message yesterday I think from my CMA informing me that [facility] needs her to have new walk test as they need 1 more recent than the one that was done in April.  I thought she had an appointment scheduled with Clarissa or Cassie for the walk test.

## 2022-09-07 NOTE — Telephone Encounter (Addendum)
  I spoke to [facility] and she confirmed that they need the O2 sats to process the order for the home O2 set up and they will not process the order until that is received.   Per the office visit note from 04/25/2022 the patient's O2 was being transferred from [facility] to [facility] at that time. I inquired what happened -then [place] said that the order for O2 was voided at that time because they did not receive the documentation they requested.   I then spoke to Melissa/ [facility] and she said the patient received a letter from her insurance company in March or April 2024 stating that [employer] is  no longer in network with [facility] and the patient needs to change O2 companies. Melissa said they had the understanding that [facility] was providing the O2.  They initially tried to pick up the patients' home O2 at the beginning of April 2024 but were not able to schedule a pick up until 08/18/2022.  She said the patient told them that her O2 from the other company had already been delivered, so they took their equipment. Melissa said that if they knew she did not have O2, they would not have removed their equipment.  Melissa also said that the walk tests don't expire any longer and the patient should not need a walk test and the results from the test 04/25/2022 should be sufficient.    I called [place]/ [facility]: [phone] and informed her that per [facility], a walk test is not needed to process the order.  Charlotte Sanes said that [facility] policy is the the walk test is only good for 30 days.  She also noted that the order will be processed the day the order is received.    I called the patient and scheduled her for a walk test tomorrow, 8/23 @ 1450.   Results can be faxed to [facility] : [phone].

## 2022-09-08 ENCOUNTER — Ambulatory Visit: Payer: Medicare HMO | Attending: Internal Medicine | Admitting: *Deleted

## 2022-09-08 DIAGNOSIS — Z9981 Dependence on supplemental oxygen: Secondary | ICD-10-CM | POA: Diagnosis not present

## 2022-09-08 DIAGNOSIS — J9611 Chronic respiratory failure with hypoxia: Secondary | ICD-10-CM

## 2022-09-08 NOTE — Telephone Encounter (Signed)
Patient came into office for a nurse visit this afternoon to do a walk test.  Room air resting SpO2- 98% Room air SpO2  during exertion is 89% SpO2 on 2 LPM via nasal cannula during exertion was 97%  Faxed results to [facility].

## 2022-09-08 NOTE — Telephone Encounter (Signed)
Walk test results, OV notes and new Nebulizer with Supply Kit order has been successfully faxed to [facility] on 09/08/2022

## 2022-09-08 NOTE — Progress Notes (Signed)
Patient came into office for a nurse visit this afternoon to do a walk test for oxygen.   Room air resting SpO2- 98% Room air SpO2  during exertion is 89% SpO2 on 2 LPM via nasal cannula during exertion was 97%  Patient has some dyspnea and increase in heart rate to 130's but this was due to poor probe placement. Probe was readusted and HR was in 90's. Otherwise patient tolerated well.   Faxed results to [facility].

## 2022-09-11 NOTE — Telephone Encounter (Signed)
noted 

## 2022-09-12 DIAGNOSIS — L728 Other follicular cysts of the skin and subcutaneous tissue: Secondary | ICD-10-CM | POA: Diagnosis not present

## 2022-09-12 DIAGNOSIS — L700 Acne vulgaris: Secondary | ICD-10-CM | POA: Diagnosis not present

## 2022-09-15 ENCOUNTER — Telehealth: Payer: Self-pay | Admitting: Internal Medicine

## 2022-09-15 NOTE — Telephone Encounter (Signed)
Pt called in at 2:45 pm b/c she had not heard from the medicare AWV nurse.  Pt states she got a call yesterday, stating this would be over the phone. No one changed it on her appt desk. So not sure if that is why she hasn't heard from anyone.  But I did change it to reflect TE, so if she has to call back and reschedule.

## 2022-09-21 ENCOUNTER — Ambulatory Visit: Payer: Medicare HMO | Admitting: Physician Assistant

## 2022-10-05 DIAGNOSIS — K589 Irritable bowel syndrome without diarrhea: Secondary | ICD-10-CM | POA: Diagnosis not present

## 2022-10-05 DIAGNOSIS — K219 Gastro-esophageal reflux disease without esophagitis: Secondary | ICD-10-CM | POA: Diagnosis not present

## 2022-10-05 DIAGNOSIS — D649 Anemia, unspecified: Secondary | ICD-10-CM | POA: Diagnosis not present

## 2022-10-05 DIAGNOSIS — R197 Diarrhea, unspecified: Secondary | ICD-10-CM | POA: Diagnosis not present

## 2022-10-05 DIAGNOSIS — K5900 Constipation, unspecified: Secondary | ICD-10-CM | POA: Diagnosis not present

## 2022-10-06 ENCOUNTER — Ambulatory Visit: Payer: Medicare HMO | Attending: Internal Medicine

## 2022-10-06 DIAGNOSIS — Z0000 Encounter for general adult medical examination without abnormal findings: Secondary | ICD-10-CM | POA: Diagnosis not present

## 2022-10-06 NOTE — Patient Instructions (Addendum)
[name] , Thank you for taking time to come for your Medicare Wellness Visit. I appreciate your ongoing commitment to your health goals. Please review the following plan we discussed and let me know if I can assist you in the future.   These are the goals we discussed:  Goals   None     This is a list of the screening recommended for you and due dates:  Health Maintenance  Topic Date Due   COVID-19 Vaccine (5 - 2023-24 season) 09/17/2022   Eye exam for diabetics  12/08/2022   Medicare Annual Wellness Visit  10/06/2023   Mammogram  06/22/2024   Colon Cancer Screening  10/06/2031   DTaP/Tdap/Td vaccine (2 - Td or Tdap) 08/24/2032   Pneumonia Vaccine  Completed   Flu Shot  Completed   DEXA scan (bone density measurement)  Completed   Hepatitis C Screening  Completed   Zoster (Shingles) Vaccine  Completed   HPV Vaccine  Aged Out   Health Maintenance After Age 38 After age 34, you are at a higher risk for certain long-term diseases and infections as well as injuries from falls. Falls are a major cause of broken bones and head injuries in people who are older than age 29. Getting regular preventive care can help to keep you healthy and well. Preventive care includes getting regular testing and making lifestyle changes as recommended by your health care provider. Talk with your health care provider about: Which screenings and tests you should have. A screening is a test that checks for a disease when you have no symptoms. A diet and exercise plan that is right for you. What should I know about screenings and tests to prevent falls? Screening and testing are the best ways to find a health problem early. Early diagnosis and treatment give you the best chance of managing medical conditions that are common after age 43. Certain conditions and lifestyle choices may make you more likely to have a fall. Your health care provider may recommend: Regular vision checks. Poor vision and conditions  such as cataracts can make you more likely to have a fall. If you wear glasses, make sure to get your prescription updated if your vision changes. Medicine review. Work with your health care provider to regularly review all of the medicines you are taking, including over-the-counter medicines. Ask your health care provider about any side effects that may make you more likely to have a fall. Tell your health care provider if any medicines that you take make you feel dizzy or sleepy. Strength and balance checks. Your health care provider may recommend certain tests to check your strength and balance while standing, walking, or changing positions. Foot health exam. Foot pain and numbness, as well as not wearing proper footwear, can make you more likely to have a fall. Screenings, including: Osteoporosis screening. Osteoporosis is a condition that causes the bones to get weaker and break more easily. Blood pressure screening. Blood pressure changes and medicines to control blood pressure can make you feel dizzy. Depression screening. You may be more likely to have a fall if you have a fear of falling, feel depressed, or feel unable to do activities that you used to do. Alcohol use screening. Using too much alcohol can affect your balance and may make you more likely to have a fall. Follow these instructions at home: Lifestyle Do not drink alcohol if: Your health care provider tells you not to drink. If you drink alcohol: Limit how  much you have to: 0-1 drink a day for women. 0-2 drinks a day for men. Know how much alcohol is in your drink. In the [place] one drink equals one 12 oz bottle of beer (355 mL), one 5 oz glass of wine (148 mL), or one 1 oz glass of hard liquor (44 mL). Do not use any products that contain nicotine or tobacco. These products include cigarettes, chewing tobacco, and vaping devices, such as e-cigarettes. If you need help quitting, ask your health care  provider. Activity  Follow a regular exercise program to stay fit. This will help you maintain your balance. Ask your health care provider what types of exercise are appropriate for you. If you need a cane or walker, use it as recommended by your health care provider. Wear supportive shoes that have nonskid soles. Safety  Remove any tripping hazards, such as rugs, cords, and clutter. Install safety equipment such as grab bars in bathrooms and safety rails on stairs. Keep rooms and walkways well-lit. General instructions Talk with your health care provider about your risks for falling. Tell your health care provider if: You fall. Be sure to tell your health care provider about all falls, even ones that seem minor. You feel dizzy, tiredness (fatigue), or off-balance. Take over-the-counter and prescription medicines only as told by your health care provider. These include supplements. Eat a healthy diet and maintain a healthy weight. A healthy diet includes low-fat dairy products, low-fat (lean) meats, and fiber from whole grains, beans, and lots of fruits and vegetables. Stay current with your vaccines. Schedule regular health, dental, and eye exams. Summary Having a healthy lifestyle and getting preventive care can help to protect your health and wellness after age 74. Screening and testing are the best way to find a health problem early and help you avoid having a fall. Early diagnosis and treatment give you the best chance for managing medical conditions that are more common for people who are older than age 19. Falls are a major cause of broken bones and head injuries in people who are older than age 70. Take precautions to prevent a fall at home. Work with your health care provider to learn what changes you can make to improve your health and wellness and to prevent falls. This information is not intended to replace advice given to you by your health care provider. Make sure you discuss  any questions you have with your health care provider. Document Revised: 05/24/2020 Document Reviewed: 05/24/2020 Elsevier Patient Education  2024 [employer].

## 2022-10-06 NOTE — Progress Notes (Signed)
Subjective:   Alison Ford is a 67 y.o. female who presents for Medicare Annual (Subsequent) preventive examination.  Visit Complete: Virtual  I connected with  Alison Ford on 10/06/22 by a audio enabled telemedicine application and verified that I am speaking with the correct person using two identifiers.  Patient Location: Home  Provider Location: Home Office  I discussed the limitations of evaluation and management by telemedicine. The patient expressed understanding and agreed to proceed.      Objective:   No vitals were taken for patient, telephone visit. Today's Vitals   10/06/22 1105  PainSc: 6    There is no height or weight on file to calculate BMI.     10/06/2022   11:20 AM 08/05/2021    2:46 PM 05/06/2021   12:29 AM 12/26/2019    3:21 PM 12/25/2019   10:27 AM 01/28/2019   12:27 PM 05/30/2017    8:26 AM  Advanced Directives  Does Patient Have a Medical Advance Directive? No No No No No No No  Would patient like information on creating a medical advance directive? Yes (ED - Information included in AVS) No - Patient declined  No - Patient declined No - Patient declined No - Patient declined No - Patient declined    Current Medications (verified) Outpatient Encounter Medications as of 10/06/2022  Medication Sig   albuterol (PROVENTIL) (2.5 MG/3ML) 0.083% nebulizer solution Take 3 mLs (2.5 mg total) by nebulization every 6 (six) hours as needed for wheezing or shortness of breath.   albuterol (VENTOLIN HFA) 108 (90 Base) MCG/ACT inhaler Inhale 2 puffs into the lungs every 6 (six) hours as needed for wheezing or shortness of breath.   albuterol (VENTOLIN HFA) 108 (90 Base) MCG/ACT inhaler Inhale 2 puffs into the lungs every 6 (six) hours as needed for wheezing or shortness of breath.   amLODipine (NORVASC) 10 MG tablet TAKE 1 TABLET EVERY DAY   aspirin EC 81 MG tablet Take 1 tablet (81 mg total) by mouth daily.   Blood Glucose Monitoring Suppl (TRUE METRIX METER)  w/Device KIT Use as directed   clotrimazole-betamethasone (LOTRISONE) cream APPLY CREAM TOPICALLY TO AFFECTED AREA TWICE DAILY   cromolyn (OPTICROM) 4 % ophthalmic solution Place 1 drop into both eyes 4 (four) times daily.   doxycycline (VIBRA-TABS) 100 MG tablet Take 1 tablet by mouth twice daily   ferrous sulfate (SV IRON) 325 (65 FE) MG tablet Take 1 tablet by mouth three times weekly. Dose decrease.   Fluticasone-Umeclidin-Vilant (TRELEGY ELLIPTA) 100-62.5-25 MCG/ACT AEPB Inhale 1 puff into the lungs daily.   Fluticasone-Umeclidin-Vilant (TRELEGY ELLIPTA) 100-62.5-25 MCG/ACT AEPB Inhale 1 puff into the lungs daily.   glucose blood (TRUE METRIX BLOOD GLUCOSE TEST) test strip Use to check blood sugar once daily.   LINZESS 145 MCG CAPS capsule TAKE 1 CAPSULE EVERY DAY BEFORE BREAKFAST   lisinopril-hydrochlorothiazide (ZESTORETIC) 20-25 MG tablet TAKE 1 TABLET EVERY DAY   lovastatin (MEVACOR) 40 MG tablet Take 1 tablet (40 mg total) by mouth at bedtime.   metFORMIN (GLUCOPHAGE) 500 MG tablet TAKE 1/2 TABLET ONCE DAILY WITH BREAKFAST   nicotine polacrilex (NICORETTE) 2 MG gum Take 1 each (2 mg total) by mouth as needed for smoking cessation.   TRUEplus Lancets 28G MISC Use as directed   cetirizine (ZYRTEC) 10 MG tablet Take 1 tablet (10 mg total) by mouth daily. (Patient not taking: Reported on 08/28/2022)   pantoprazole (PROTONIX) 40 MG tablet Take 1 tablet (40 mg total) by mouth  daily.   predniSONE (DELTASONE) 20 MG tablet Take 1 tablet by mouth once daily with breakfast (Patient not taking: Reported on 10/06/2022)   No facility-administered encounter medications on file as of 10/06/2022.    Allergies (verified) Penicillins   History: Past Medical History:  Diagnosis Date   Abnormal facial hair    Back pain    d/t knee pain   Headache(784.0)    Heart murmur    slight   HTN (hypertension)    takes Amlodipine daily and HCTZ    Hyperlipidemia    takes Lovastatin nightly   Joint pain     Right knee DJD    Weakness    left hand   Past Surgical History:  Procedure Laterality Date   ABDOMINAL HYSTERECTOMY     Athroscopic knee surgery     Bilateral   BREAST BIOPSY Right 03/2017   CARPAL TUNNEL RELEASE Right 05/02/2016   Procedure: CARPAL TUNNEL RELEASE;  Surgeon: Cammy Copa, MD;  Location: [facility] OR;  Service: Orthopedics;  Laterality: Right;   CHOLECYSTECTOMY     DILATION AND CURETTAGE OF UTERUS     EYE SURGERY Right    SHOULDER ARTHROSCOPY WITH SUBACROMIAL DECOMPRESSION Right 05/02/2016   Procedure: RIGHT SHOULDER ARTHROSCOPY WITH SUBACROMIAL DECOMPRESSION, MINI OPEN ROTATOR CUFF TEAR REPAIR, AND RIGHT CARPAL TUNNEL RELEASE;  Surgeon: Cammy Copa, MD;  Location: [facility] OR;  Service: Orthopedics;  Laterality: Right;   TOTAL KNEE ARTHROPLASTY Right 07/15/2012   Procedure: RIGHT TOTAL KNEE ARTHROPLASTY;  Surgeon: Nilda Simmer, MD;  Location: [facility];  Service: Orthopedics;  Laterality: Right;   Family History  Problem Relation Age of Onset   CAD Father 67   CAD Mother 38   Hypertension Mother    Social History   Socioeconomic History   Marital status: Legally Separated    Spouse name: Not on file   Number of children: 3   Years of education: Not on file   Highest education level: Not on file  Occupational History    Employer: [employer]  Tobacco Use   Smoking status: Every Day    Current packs/day: 0.00    Types: Cigarettes    Last attempt to quit: 08/13/2020    Years since quitting: 2.1   Smokeless tobacco: Never   Tobacco comments:    Started smoking  Substance and Sexual Activity   Alcohol use: Yes    Alcohol/week: 0.0 standard drinks of alcohol    Comment: ocassionally   Drug use: No   Sexual activity: Never  Other Topics Concern   Not on file  Social History Narrative   Lives by herself now.    Social Determinants of Health   Financial Resource Strain: Low Risk  (10/06/2022)   Overall Financial Resource Strain (CARDIA)    Difficulty  of Paying Living Expenses: Not hard at all  Food Insecurity: No Food Insecurity (07/19/2022)   Hunger Vital Sign    Worried About Running Out of Food in the Last Year: Never true    Ran Out of Food in the Last Year: Never true  Transportation Needs: No Transportation Needs (07/19/2022)   PRAPARE - Administrator, Civil Service (Medical): No    Lack of Transportation (Non-Medical): No  Physical Activity: Insufficiently Active (10/06/2022)   Exercise Vital Sign    Days of Exercise per Week: 7 days    Minutes of Exercise per Session: 20 min  Stress: No Stress Concern Present (10/06/2022)   [employer] of  Occupational Health - Occupational Stress Questionnaire    Feeling of Stress : Not at all  Social Connections: Socially Isolated (10/06/2022)   Social Connection and Isolation Panel [NHANES]    Frequency of Communication with Friends and Family: More than three times a week    Frequency of Social Gatherings with Friends and Family: Once a week    Attends Religious Services: Never    Database administrator or Organizations: No    Attends Engineer, structural: Never    Marital Status: Separated    Tobacco Counseling Ready to quit: Not Answered Counseling given: Not Answered Tobacco comments: Started smoking   Clinical Intake:  Pre-visit preparation completed: No  Pain : 0-10 Pain Score: 6  Pain Type: Chronic pain Pain Location: Leg Pain Orientation: Left, Right Pain Descriptors / Indicators: Aching Pain Onset: More than a month ago Pain Frequency: Several days a week     Nutritional Risks: None Diabetes: Yes CBG done?: No Did pt. bring in CBG monitor from home?: No  How often do you need to have someone help you when you read instructions, pamphlets, or other written materials from your doctor or pharmacy?: 1 - Never  Interpreter Needed?: No      Activities of Daily Living    10/06/2022   11:07 AM  In your present state of health, do you  have any difficulty performing the following activities:  Hearing? 0  Vision? 0  Difficulty concentrating or making decisions? 0  Walking or climbing stairs? 1  Dressing or bathing? 0  Doing errands, shopping? 0  Preparing Food and eating ? N  Using the Toilet? N  In the past six months, have you accidently leaked urine? N  Do you have problems with loss of bowel control? N  Managing your Medications? N  Managing your Finances? N  Housekeeping or managing your Housekeeping? N    Patient Care Team: Marcine Matar, MD as PCP - General (Internal Medicine)  Indicate any recent Medical Services you may have received from other than [facility] providers in the past year (date may be approximate).     Assessment:   This is a routine wellness examination for [place].  Hearing/Vision screen No results found.   Goals Addressed   None    Depression Screen    10/06/2022   11:19 AM 08/28/2022    1:41 PM 04/25/2022    9:40 AM 08/05/2021    2:47 PM 07/20/2021   12:21 PM 06/24/2021    4:25 PM 12/26/2019    3:17 PM  PHQ 2/9 Scores  PHQ - 2 Score 0 0 1 0 0 0 1  PHQ- 9 Score 0 1 2 1  0      Fall Risk    10/06/2022   11:20 AM 08/28/2022    1:41 PM 04/25/2022    9:36 AM 08/05/2021    2:47 PM 07/20/2021   12:15 PM  Fall Risk   Falls in the past year? 0 0 0 0 1  Number falls in past yr: 0 0 0 0 0  Injury with Fall? 0 0 0 0 0  Risk for fall due to : No Fall Risks No Fall Risks No Fall Risks    Follow up Education provided Falls evaluation completed  Falls evaluation completed;Education provided;Falls prevention discussed     MEDICARE RISK AT HOME: Medicare Risk at Home Any stairs in or around the home?: No If so, are there any without handrails?: No Home free  of loose throw rugs in walkways, pet beds, electrical cords, etc?: Yes Adequate lighting in your home to reduce risk of falls?: Yes Life alert?: No Use of a cane, walker or w/c?: Yes Grab bars in the bathroom?: Yes Shower chair or  bench in shower?: No Elevated toilet seat or a handicapped toilet?: No  TIMED UP AND GO:  Was the test performed?  No    Cognitive Function:    08/05/2021    2:50 PM 12/26/2019    3:18 PM  MMSE - Mini Mental State Exam  Orientation to time 5 5  Orientation to Place 5 5  Registration 3 3  Attention/ Calculation 5 5  Recall 0 3  Language- name 2 objects 2 2  Language- repeat 1 1  Language- follow 3 step command 3 3  Language- read & follow direction 1 1  Write a sentence 1 1  Copy design 1 0  Total score 27 29        10/06/2022   11:21 AM  6CIT Screen  What Year? 0 points  What month? 0 points  What time? 0 points  Count back from 20 0 points  Months in reverse 0 points  Repeat phrase 0 points  Total Score 0 points    Immunizations Immunization History  Administered Date(s) Administered   Hep A / Hep B 08/15/2021   Hep A, Unspecified 05/18/2022   Hepatitis B 05/18/2022   Influenza, High Dose Seasonal PF 08/25/2022   Influenza, Quadrivalent, Recombinant, Inj, Pf 10/22/2018   Influenza,inj,Quad PF,6+ Mos 02/11/2014, 02/11/2015, 09/29/2015   Influenza-Unspecified 11/04/2019   PFIZER(Purple Top)SARS-COV-2 Vaccination 04/23/2019, 05/14/2019, 02/18/2020   PNEUMOCOCCAL CONJUGATE-20 08/10/2021   Pfizer Covid-19 Vaccine Bivalent Booster 53yrs & up 12/19/2021   Pneumococcal Polysaccharide-23 02/18/2020   RSV,unspecified 05/18/2022   Tdap 08/25/2022   Zoster Recombinant(Shingrix) 11/04/2019, 02/18/2020    TDAP status: Up to date  Flu Vaccine status: Up to date  Pneumococcal vaccine status: Up to date  Covid-19 vaccine status: Completed vaccines  Qualifies for Shingles Vaccine? Yes   Zostavax completed Yes   Shingrix Completed?: Yes  Screening Tests Health Maintenance  Topic Date Due   COVID-19 Vaccine (5 - 2023-24 season) 09/17/2022   OPHTHALMOLOGY EXAM  12/08/2022   Medicare Annual Wellness (AWV)  10/06/2023   MAMMOGRAM  06/22/2024   Colonoscopy   10/06/2031   DTaP/Tdap/Td (2 - Td or Tdap) 08/24/2032   Pneumonia Vaccine 40+ Years old  Completed   INFLUENZA VACCINE  Completed   DEXA SCAN  Completed   Hepatitis C Screening  Completed   Zoster Vaccines- Shingrix  Completed   HPV VACCINES  Aged Out    Health Maintenance  Health Maintenance Due  Topic Date Due   COVID-19 Vaccine (5 - 2023-24 season) 09/17/2022    Colorectal cancer screening: Type of screening: Colonoscopy. Completed 10/05/2021. Repeat every 10 years  Mammogram status: Completed 06/23/2022. Repeat every year  Bone Density: Completed 09/27/2021: see chart for results  Lung Cancer Screening: (Low Dose CT Chest recommended if Age 33-80 years, 20 pack-year currently smoking OR have quit w/in 15years.) does not qualify.   Lung Cancer Screening Referral: N/A  Additional Screening:  Hepatitis C Screening: does qualify; Completed 05/30/2016   Vision Screening: Recommended annual ophthalmology exams for early detection of glaucoma and other disorders of the eye. Is the patient up to date with their annual eye exam?  Yes  Who is the provider or what is the name of the office in which  the patient attends annual eye exams? [facility] If pt is not established with a provider, would they like to be referred to a provider to establish care? No .   Dental Screening: Recommended annual dental exams for proper oral hygiene   Community Resource Referral / Chronic Care Management: CRR required this visit?  No   CCM required this visit?  No     Plan:     I have personally reviewed and noted the following in the patient's chart:   Medical and social history Use of alcohol, tobacco or illicit drugs  Current medications and supplements including opioid prescriptions. Patient is not currently taking opioid prescriptions. Functional ability and status Nutritional status Physical activity Advanced directives List of other physicians Hospitalizations, surgeries, and ER  visits in previous 12 months Vitals Screenings to include cognitive, depression, and falls Referrals and appointments  In addition, I have reviewed and discussed with patient certain preventive protocols, quality metrics, and best practice recommendations. A written personalized care plan for preventive services as well as general preventive health recommendations were provided to patient.     Ronette Deter, CMA   10/06/2022   After Visit Summary: (Mail) Due to this being a telephonic visit, the after visit summary with patients personalized plan was offered to patient via mail   Nurse Notes: I spent 30 minutes on this telephone encounter

## 2022-10-11 ENCOUNTER — Encounter: Payer: Self-pay | Admitting: Pulmonary Disease

## 2022-10-11 ENCOUNTER — Ambulatory Visit: Payer: Medicare HMO | Admitting: Pulmonary Disease

## 2022-10-11 VITALS — BP 128/64 | HR 66 | Temp 98.8°F | Ht 67.5 in | Wt 185.4 lb

## 2022-10-11 DIAGNOSIS — J439 Emphysema, unspecified: Secondary | ICD-10-CM | POA: Diagnosis not present

## 2022-10-11 DIAGNOSIS — J449 Chronic obstructive pulmonary disease, unspecified: Secondary | ICD-10-CM

## 2022-10-11 DIAGNOSIS — J4489 Other specified chronic obstructive pulmonary disease: Secondary | ICD-10-CM | POA: Diagnosis not present

## 2022-10-11 DIAGNOSIS — J441 Chronic obstructive pulmonary disease with (acute) exacerbation: Secondary | ICD-10-CM

## 2022-10-11 MED ORDER — PREDNISONE 20 MG PO TABS: 20.0000 mg | ORAL_TABLET | Freq: Every day | ORAL | 0 refills | Status: DC

## 2022-10-11 MED ORDER — TRELEGY ELLIPTA 200-62.5-25 MCG/ACT IN AEPB: 1.0000 | INHALATION_SPRAY | Freq: Every day | RESPIRATORY_TRACT | 5 refills | Status: DC

## 2022-10-11 MED ORDER — AZITHROMYCIN 250 MG PO TABS: ORAL_TABLET | ORAL | 0 refills | Status: DC

## 2022-10-11 NOTE — Progress Notes (Signed)
Alison Ford    [id number]    10-Jul-1955  Primary Care Physician:Johnson, Binnie Rail, MD  Referring Physician: Marcine Matar, MD [address] [place],  [place] [address]  Chief complaint:  Patient in for follow-up Exacerbation of symptoms  HPI:   Patient with a history of COPD Compliant with Trelegy  Increased cough, increased shortness of breath, increased wheezing  She continues to smoke about half a pack a day, states she is trying to quit but not been able to do so yet  She denies any significant worsening of activity tolerance Weight has been stable  Has smoked about the same for a long time  We did review recent CT scan-significant for emphysema We did review pulmonary function test which shows significant decrease in FEV1 and diffusing capacity  She did have some improvement with the Trelegy but she gets anxious about using it, she feels she smokes more thinking about her lung disease  Denies any chest pains or chest discomfort  She worked in Engineering geologist, housekeeping in the past  No family history of lung disease   Outpatient Encounter Medications as of 10/11/2022  Medication Sig   albuterol (PROVENTIL) (2.5 MG/3ML) 0.083% nebulizer solution Take 3 mLs (2.5 mg total) by nebulization every 6 (six) hours as needed for wheezing or shortness of breath.   albuterol (VENTOLIN HFA) 108 (90 Base) MCG/ACT inhaler Inhale 2 puffs into the lungs every 6 (six) hours as needed for wheezing or shortness of breath.   albuterol (VENTOLIN HFA) 108 (90 Base) MCG/ACT inhaler Inhale 2 puffs into the lungs every 6 (six) hours as needed for wheezing or shortness of breath.   amLODipine (NORVASC) 10 MG tablet TAKE 1 TABLET EVERY DAY   aspirin EC 81 MG tablet Take 1 tablet (81 mg total) by mouth daily.   Blood Glucose Monitoring Suppl (TRUE METRIX METER) w/Device KIT Use as directed   cetirizine (ZYRTEC) 10 MG tablet Take 1 tablet (10 mg total) by mouth daily.  (Patient not taking: Reported on 08/28/2022)   clotrimazole-betamethasone (LOTRISONE) cream APPLY CREAM TOPICALLY TO AFFECTED AREA TWICE DAILY   cromolyn (OPTICROM) 4 % ophthalmic solution Place 1 drop into both eyes 4 (four) times daily.   doxycycline (VIBRA-TABS) 100 MG tablet Take 1 tablet by mouth twice daily   ferrous sulfate (SV IRON) 325 (65 FE) MG tablet Take 1 tablet by mouth three times weekly. Dose decrease.   Fluticasone-Umeclidin-Vilant (TRELEGY ELLIPTA) 100-62.5-25 MCG/ACT AEPB Inhale 1 puff into the lungs daily.   Fluticasone-Umeclidin-Vilant (TRELEGY ELLIPTA) 100-62.5-25 MCG/ACT AEPB Inhale 1 puff into the lungs daily.   glucose blood (TRUE METRIX BLOOD GLUCOSE TEST) test strip Use to check blood sugar once daily.   LINZESS 145 MCG CAPS capsule TAKE 1 CAPSULE EVERY DAY BEFORE BREAKFAST   lisinopril-hydrochlorothiazide (ZESTORETIC) 20-25 MG tablet TAKE 1 TABLET EVERY DAY   lovastatin (MEVACOR) 40 MG tablet Take 1 tablet (40 mg total) by mouth at bedtime.   metFORMIN (GLUCOPHAGE) 500 MG tablet TAKE 1/2 TABLET ONCE DAILY WITH BREAKFAST   nicotine polacrilex (NICORETTE) 2 MG gum Take 1 each (2 mg total) by mouth as needed for smoking cessation.   pantoprazole (PROTONIX) 40 MG tablet Take 1 tablet (40 mg total) by mouth daily.   predniSONE (DELTASONE) 20 MG tablet Take 1 tablet by mouth once daily with breakfast (Patient not taking: Reported on 10/06/2022)   TRUEplus Lancets 28G MISC Use as directed   No  facility-administered encounter medications on file as of 10/11/2022.    Allergies as of 10/11/2022 - Review Complete 10/06/2022  Allergen Reaction Noted   Penicillins Itching and Other (See Comments) 07/28/2011    Past Medical History:  Diagnosis Date   Abnormal facial hair    Back pain    d/t knee pain   Headache(784.0)    Heart murmur    slight   HTN (hypertension)    takes Amlodipine daily and HCTZ    Hyperlipidemia    takes Lovastatin nightly   Joint pain    Right  knee DJD    Weakness    left hand    Past Surgical History:  Procedure Laterality Date   ABDOMINAL HYSTERECTOMY     Athroscopic knee surgery     Bilateral   BREAST BIOPSY Right 03/2017   CARPAL TUNNEL RELEASE Right 05/02/2016   Procedure: CARPAL TUNNEL RELEASE;  Surgeon: Cammy Copa, MD;  Location: [facility];  Service: Orthopedics;  Laterality: Right;   CHOLECYSTECTOMY     DILATION AND CURETTAGE OF UTERUS     EYE SURGERY Right    SHOULDER ARTHROSCOPY WITH SUBACROMIAL DECOMPRESSION Right 05/02/2016   Procedure: RIGHT SHOULDER ARTHROSCOPY WITH SUBACROMIAL DECOMPRESSION, MINI OPEN ROTATOR CUFF TEAR REPAIR, AND RIGHT CARPAL TUNNEL RELEASE;  Surgeon: Cammy Copa, MD;  Location: [facility];  Service: Orthopedics;  Laterality: Right;   TOTAL KNEE ARTHROPLASTY Right 07/15/2012   Procedure: RIGHT TOTAL KNEE ARTHROPLASTY;  Surgeon: Nilda Simmer, MD;  Location: [facility];  Service: Orthopedics;  Laterality: Right;    Family History  Problem Relation Age of Onset   CAD Father 48   CAD Mother 54   Hypertension Mother     Social History   Socioeconomic History   Marital status: Legally Separated    Spouse name: Not on file   Number of children: 3   Years of education: Not on file   Highest education level: Not on file  Occupational History    Employer: [employer]  Tobacco Use   Smoking status: Every Day    Current packs/day: 0.00    Types: Cigarettes    Last attempt to quit: 08/13/2020    Years since quitting: 2.1   Smokeless tobacco: Never   Tobacco comments:    Started smoking  Substance and Sexual Activity   Alcohol use: Yes    Alcohol/week: 0.0 standard drinks of alcohol    Comment: ocassionally   Drug use: No   Sexual activity: Never  Other Topics Concern   Not on file  Social History Narrative   Lives by herself now.    Social Determinants of Health   Financial Resource Strain: Low Risk  (10/06/2022)   Overall Financial Resource Strain (CARDIA)    Difficulty of  Paying Living Expenses: Not hard at all  Food Insecurity: No Food Insecurity (07/19/2022)   Hunger Vital Sign    Worried About Running Out of Food in the Last Year: Never true    Ran Out of Food in the Last Year: Never true  Transportation Needs: No Transportation Needs (07/19/2022)   PRAPARE - Administrator, Civil Service (Medical): No    Lack of Transportation (Non-Medical): No  Physical Activity: Insufficiently Active (10/06/2022)   Exercise Vital Sign    Days of Exercise per Week: 7 days    Minutes of Exercise per Session: 20 min  Stress: No Stress Concern Present (10/06/2022)   [employer] of Occupational Health - Occupational Stress  Questionnaire    Feeling of Stress : Not at all  Social Connections: Socially Isolated (10/06/2022)   Social Connection and Isolation Panel [NHANES]    Frequency of Communication with Friends and Family: More than three times a week    Frequency of Social Gatherings with Friends and Family: Once a week    Attends Religious Services: Never    Database administrator or Organizations: No    Attends Banker Meetings: Never    Marital Status: Separated  Intimate Partner Violence: Not At Risk (10/06/2022)   Humiliation, Afraid, Rape, and Kick questionnaire    Fear of Current or Ex-Partner: No    Emotionally Abused: No    Physically Abused: No    Sexually Abused: No    Review of Systems  Constitutional: Negative.   HENT: Negative.    Eyes: Negative.   Respiratory:  Positive for cough and shortness of breath.   Cardiovascular: Negative.   Gastrointestinal: Negative.   Genitourinary: Negative.   Musculoskeletal: Negative.     There were no vitals filed for this visit.    Physical Exam Constitutional:      Appearance: Normal appearance.  HENT:     Head: Normocephalic and atraumatic.     Nose: No congestion.  Eyes:     General: No scleral icterus.    Pupils: Pupils are equal, round, and reactive to light.   Cardiovascular:     Rate and Rhythm: Normal rate and regular rhythm.     Pulses: Normal pulses.     Heart sounds: Normal heart sounds. No murmur heard.    No friction rub.  Pulmonary:     Effort: Pulmonary effort is normal. No respiratory distress.     Breath sounds: No stridor. Rhonchi present. No wheezing.  Musculoskeletal:     Cervical back: No rigidity or tenderness.  Neurological:     Mental Status: She is alert.  Psychiatric:        Mood and Affect: Mood normal.    Data Reviewed: Echocardiogram 06/06/2019: Ejection fraction of 65 to 70%, dilated atria no mention of pulmonary hypertension Pulmonary function test 08/21/2019 significant for mild obstructive disease with no significant bronchodilator response  P FT reviewed with the patient showing decreased FEV1 and FVC  CT scan reviewed with the patient showing evidence of emphysema  Assessment:   Chronic obstructive pulmonary disease -Group 3 COPD with exacerbation of symptoms -Course of antibiotics and steroids -Increase Trelegy to 200 -Continue albuterol as needed  Inhaler technique reviewed  An active smoker -Smoking cessation counseling provided  Risk with progressive disease discussed if she continues to smoke discussed again today   Plan/Recommendations:  Continue Trelegy -Increase Trelegy to 200  Prescription for azithromycin sent in  Prescription for prednisone 20 mg daily for 5 to 7 days sent in  Smoking cessation counseling  The risks with continuing to smoke was reiterated  Follow-up in 6 months  I spent 30 minutes dedicated to the care of this patient on the date of this encounter to include previsit review of records, face-to-face time with the patient discussing conditions above, post visit ordering of testing, clinical documentation with electronic health record.     Virl Diamond MD [facility] 10/11/2022, 9:24 AM  CC: Marcine Matar, MD

## 2022-10-11 NOTE — Patient Instructions (Signed)
I will see you back in about 6 months  Continue to focus on trying to quit smoking  Will increase the strength of her Trelegy from 100-200  I will call you in a course of antibiotics-azithromycin and a course of prednisone  Your breathing study as we reviewed shows severe obstruction Your CAT scan as we reviewed does reveal evidence of emphysema  The only intervention that changes the course/trajectory of where things are now is to quit smoking

## 2022-10-25 ENCOUNTER — Other Ambulatory Visit: Payer: Self-pay | Admitting: Internal Medicine

## 2022-10-25 NOTE — Telephone Encounter (Signed)
Requested medication (s) are due for refill today: es  Requested medication (s) are on the active medication list: yes    Last refill:   #12   0 refills  Future visit scheduled Yes 12/28/22  Notes to clinic: Failed due to labs, please review. Thank you  Requested Prescriptions  Pending Prescriptions Disp Refills   SV IRON 325 (65 Fe) MG tablet [Pharmacy Med Name: SV Iron 325 MG Oral Tablet] 12 tablet 0    Sig: TAKE 1 TABLET BY MOUTH 3 TIMES WEEKLY *DOSE DECREASE     Endocrinology:  Minerals - Iron Supplementation Failed - 10/25/2022 10:24 AM      Failed - Fe (serum) in normal range and within 360 days    Iron  Date Value Ref Range Status  04/30/2019 30 27 - 139 ug/dL Final   Iron Saturation  Date Value Ref Range Status  04/30/2019 6 (LL) 15 - 55 % Final         Failed - Ferritin in normal range and within 360 days    Ferritin  Date Value Ref Range Status  04/30/2019 10 (L) 15 - 150 ng/mL Final         Passed - HGB in normal range and within 360 days    Hemoglobin  Date Value Ref Range Status  04/25/2022 13.5 11.1 - 15.9 g/dL Final         Passed - HCT in normal range and within 360 days    Hematocrit  Date Value Ref Range Status  04/25/2022 40.8 34.0 - 46.6 % Final         Passed - RBC in normal range and within 360 days    RBC  Date Value Ref Range Status  04/25/2022 4.34 3.77 - 5.28 x10E6/uL Final  05/07/2017 4.03 3.80 - 5.10 Million/uL Final         Passed - Valid encounter within last 12 months    Recent Outpatient Visits           1 month ago Chronic respiratory failure with hypoxia, on home oxygen therapy [facility])   [place] [facility] Jonah Blue B, MD   6 months ago Stage 1 mild COPD by GOLD classification [facility])   [place] [facility] Marcine Matar, MD   1 year ago Need for vaccination against Streptococcus pneumoniae   [facility] [place], Cornelius Moras,  RPH-CPP   1 year ago Essential hypertension   [place] [facility] Marcine Matar, MD   2 years ago Stage 1 mild COPD by GOLD classification [facility])   [facility] Marcine Matar, MD       Future Appointments             In 2 months Laural Benes, Binnie Rail, MD [facility]

## 2022-11-20 ENCOUNTER — Other Ambulatory Visit: Payer: Self-pay | Admitting: Internal Medicine

## 2022-11-20 DIAGNOSIS — I10 Essential (primary) hypertension: Secondary | ICD-10-CM

## 2022-11-20 DIAGNOSIS — R7303 Prediabetes: Secondary | ICD-10-CM

## 2022-11-21 NOTE — Telephone Encounter (Signed)
Requested Prescriptions  Pending Prescriptions Disp Refills   metFORMIN (GLUCOPHAGE) 500 MG tablet [Pharmacy Med Name: metFORMIN HCl Oral Tablet 500 MG] 45 tablet 3    Sig: TAKE 1/2 TABLET ONCE DAILY WITH BREAKFAST     Endocrinology:  Diabetes - Biguanides Failed - 11/20/2022 10:48 AM      Failed - HBA1C is between 0 and 7.9 and within 180 days    Hgb A1c MFr Bld  Date Value Ref Range Status  04/25/2022 5.7 (H) 4.8 - 5.6 % Final    Comment:             Prediabetes: 5.7 - 6.4          Diabetes: >6.4          Glycemic control for adults with diabetes: <7.0          Failed - B12 Level in normal range and within 720 days    No results found for: "VITAMINB12"       Failed - CBC within normal limits and completed in the last 12 months    WBC  Date Value Ref Range Status  04/25/2022 5.6 3.4 - 10.8 x10E3/uL Final  05/07/2017 7.0 3.8 - 10.8 Thousand/uL Final   RBC  Date Value Ref Range Status  04/25/2022 4.34 3.77 - 5.28 x10E6/uL Final  05/07/2017 4.03 3.80 - 5.10 Million/uL Final   Hemoglobin  Date Value Ref Range Status  04/25/2022 13.5 11.1 - 15.9 g/dL Final   Hematocrit  Date Value Ref Range Status  04/25/2022 40.8 34.0 - 46.6 % Final   MCHC  Date Value Ref Range Status  04/25/2022 33.1 31.5 - 35.7 g/dL Final  95/62/1308 65.7 32.0 - 36.0 g/dL Final   [facility]  Date Value Ref Range Status  04/25/2022 31.1 26.6 - 33.0 pg Final  05/07/2017 30.5 27.0 - 33.0 pg Final   MCV  Date Value Ref Range Status  04/25/2022 94 79 - 97 fL Final   No results found for: "PLTCOUNTKUC", "LABPLAT", "POCPLA" RDW  Date Value Ref Range Status  04/25/2022 11.0 (L) 11.7 - 15.4 % Final         Passed - Cr in normal range and within 360 days    Creat  Date Value Ref Range Status  09/29/2015 0.85 0.50 - 0.99 mg/dL Final    Comment:      For patients > or = 67 years of age: The upper reference limit for Creatinine is approximately 13% higher for people identified as African-American.       Creatinine, Ser  Date Value Ref Range Status  04/25/2022 0.81 0.57 - 1.00 mg/dL Final         Passed - eGFR in normal range and within 360 days    GFR, Est African American  Date Value Ref Range Status  09/29/2015 86 >=60 mL/min Final   GFR calc Af Amer  Date Value Ref Range Status  11/26/2019 83 >59 mL/min/1.73 Final    Comment:    **In accordance with recommendations from the NKF-ASN Task force,**   [facility] is in the process of updating its eGFR calculation to the   2021 CKD-EPI creatinine equation that estimates kidney function   without a race variable.    GFR, Est Non African American  Date Value Ref Range Status  09/29/2015 75 >=60 mL/min Final   GFR calc non Af Amer  Date Value Ref Range Status  11/26/2019 72 >59 mL/min/1.73 Final   eGFR  Date Value Ref Range  Status  04/25/2022 80 >59 mL/min/1.73 Final         Passed - Valid encounter within last 6 months    Recent Outpatient Visits           2 months ago Chronic respiratory failure with hypoxia, on home oxygen therapy (HCC)   [place] [facility] [place]. [facility] Marcine Matar, MD   7 months ago Stage 1 mild COPD by GOLD classification [facility])   [place] [facility] [place]. [facility] Marcine Matar, MD   1 year ago Need for vaccination against Streptococcus pneumoniae   [place] [facility] Of [place]. [facility] Drucilla Chalet, RPH-CPP   1 year ago Essential hypertension   [place] [facility] [place] [facility] Of [place]. [facility] Marcine Matar, MD   2 years ago Stage 1 mild COPD by GOLD classification [facility])   [place] [facility] - A Dept Of . [facility] Marcine Matar, MD       Future Appointments             In 1 month Laural Benes Binnie Rail, MD [facility] [place] - [facility] Dept Of [place]igha Bridegroom. [facility]              lisinopril-hydrochlorothiazide (ZESTORETIC) 20-25 MG tablet [Pharmacy Med Name: Lisinopril-hydroCHLOROthiazide Oral Tablet 20-25 MG] 30 tablet 11    Sig: TAKE 1 TABLET EVERY DAY (NEED MD APPOINTMENT)     Cardiovascular:  ACEI + Diuretic Combos Failed - 11/20/2022 10:48 AM      Failed - Na in normal range and within 180 days    Sodium  Date Value Ref Range Status  04/25/2022 141 134 - 144 mmol/L Final         Failed - K in normal range and within 180 days    Potassium  Date Value Ref Range Status  04/25/2022 4.2 3.5 - 5.2 mmol/L Final         Failed - Cr in normal range and within 180 days    Creat  Date Value Ref Range Status  09/29/2015 0.85 0.50 - 0.99 mg/dL Final    Comment:      For patients > or = 67 years of age: The upper reference limit for Creatinine is approximately 13% higher for people identified as African-American.      Creatinine, Ser  Date Value Ref Range Status  04/25/2022 0.81 0.57 - 1.00 mg/dL Final         Failed - eGFR is 30 or above and within 180 days    GFR, Est African American  Date Value Ref Range Status  09/29/2015 86 >=60 mL/min Final   GFR calc Af Amer  Date Value Ref Range Status  11/26/2019 83 >59 mL/min/1.73 Final    Comment:    **In accordance with recommendations from the NKF-ASN Task force,**   [facility] is in the process of updating its eGFR calculation to the   2021 CKD-EPI creatinine equation that estimates kidney function   without a race variable.    GFR, Est Non African American  Date Value Ref Range Status  09/29/2015 75 >=60 mL/min Final   GFR calc non Af Amer  Date Value Ref Range Status  11/26/2019 72 >59 mL/min/1.73 Final   eGFR  Date Value  Ref Range Status  04/25/2022 80 >59 mL/min/1.73 Final         Passed - Patient is not pregnant      Passed - Last BP in normal range    BP Readings from Last 1 Encounters:  10/11/22 128/64         Passed - Valid encounter within last 6 months    Recent  Outpatient Visits           2 months ago Chronic respiratory failure with hypoxia, on home oxygen therapy (HCC)   [place] [facility] [place]. [facility] Marcine Matar, MD   7 months ago Stage 1 mild COPD by GOLD classification [facility])   [place] [facility] Dept Of [place]. [facility] Marcine Matar, MD   1 year ago Need for vaccination against Streptococcus pneumoniae   [place] [facility] [place] - A Dept Of [place]. [facility] Drucilla Chalet, RPH-CPP   1 year ago Essential hypertension   [place] [facility] [place] [facility] Dept Of [place]. [facility] Marcine Matar, MD   2 years ago Stage 1 mild COPD by GOLD classification [facility])   [place] [facility] - A Dept Of [place]. [facility] Marcine Matar, MD       Future Appointments             In 1 month Laural Benes Binnie Rail, MD [facility] [place] - A Dept Of [place] Bridegroom. [facility]

## 2022-12-28 ENCOUNTER — Ambulatory Visit: Payer: Medicare HMO | Attending: Internal Medicine | Admitting: Internal Medicine

## 2022-12-28 ENCOUNTER — Encounter: Payer: Self-pay | Admitting: Internal Medicine

## 2022-12-28 VITALS — BP 116/76 | HR 73 | Temp 98.3°F | Ht 67.0 in | Wt 181.0 lb

## 2022-12-28 DIAGNOSIS — F17200 Nicotine dependence, unspecified, uncomplicated: Secondary | ICD-10-CM

## 2022-12-28 DIAGNOSIS — E785 Hyperlipidemia, unspecified: Secondary | ICD-10-CM | POA: Diagnosis not present

## 2022-12-28 DIAGNOSIS — J449 Chronic obstructive pulmonary disease, unspecified: Secondary | ICD-10-CM | POA: Diagnosis not present

## 2022-12-28 DIAGNOSIS — I10 Essential (primary) hypertension: Secondary | ICD-10-CM | POA: Diagnosis not present

## 2022-12-28 DIAGNOSIS — R7303 Prediabetes: Secondary | ICD-10-CM | POA: Diagnosis not present

## 2022-12-28 LAB — POCT GLYCOSYLATED HEMOGLOBIN (HGB A1C): HbA1c, POC (controlled diabetic range): 5.6 % (ref 0.0–7.0)

## 2022-12-28 LAB — GLUCOSE, POCT (MANUAL RESULT ENTRY): POC Glucose: 125 mg/dL — AB (ref 70–99)

## 2022-12-28 MED ORDER — ALBUTEROL SULFATE HFA 108 (90 BASE) MCG/ACT IN AERS: 2.0000 | INHALATION_SPRAY | Freq: Four times a day (QID) | RESPIRATORY_TRACT | 6 refills | Status: DC | PRN

## 2022-12-28 MED ORDER — LOVASTATIN 40 MG PO TABS: 40.0000 mg | ORAL_TABLET | Freq: Every day | ORAL | 2 refills | Status: DC

## 2022-12-28 NOTE — Progress Notes (Signed)
Patient ID: Alison Ford, female    DOB: January 21, 1955  MRN: [id number]  CC: Hypertension (HTN f/u. /No questions / concerns/Already received flu vax.)   Subjective: Alison Ford is a 67 y.o. female who presents for chronic ds management. Her concerns today include:  Pt with hx of  HTN, preDM, HL, tob dep, COPD stage 1 (Dr. Wynona Neat), on home O2, IDA, Vit D def and s/p RT rotator cuff repair and RT CTS release, LT OA knee, chronic constipation, IBS, history of colon polyps.   Discussed the use of AI scribe software for clinical note transcription with the patient, who gave verbal consent to proceed.   History of Present Illness The patient, with a history of COPD, iron deficiency anemia, hypertension, hyperlipidemia, and prediabetes, presents for a routine follow-up.   COPD/Tob dep:  Saw her pulmonologist Dr. Wynona Neat 09/2022. She reports inconsistent use of her Trelegy inhaler due to a fear of combining it with smoking. The patient previously used supplemental oxygen for her COPD but no longer meets the criteria for it. She now uses a nebulizer three to four times weekly as needed for breathing difficulties.   She admits to smoking three to four cigarettes daily, primarily after meals. She previously participated in the Quit Now program, utilizing patches and gum, but relapsed after three months.  HTN/HL: The patient's hypertension and hyperlipidemia are well-controlled on amlodipine 10mg  daily, lisinopril-hydrochlorothiazide 20/25mg  daily, and lovastatin 40mg  nightly. Her blood pressure today was 116/76, and her last cholesterol check in April was slightly elevated at 101. -no CP/LE edema  PreDM:  Today's A1c was 5.6, indicating she is no longer in the prediabetic range.  She continues to take metformin 500 mg half a tablet daily.  Tries to control her eating habits.  Weight has remained fairly stable since I last saw her.  IDA: On iron supplement 3 times a week.  Followed by [facility]  gastroenterology.   Patient Active Problem List   Diagnosis Date Noted   Left adrenal mass (HCC) 04/25/2022   Chronic respiratory failure with hypoxia, on home oxygen therapy (HCC) 04/25/2022   Angiodysplasia of stomach and duodenum without bleeding 12/14/2021   COVID-19 12/28/2020   Class 1 obesity due to excess calories with serious comorbidity and body mass index (BMI) of 31.0 to 31.9 in adult 05/15/2019   Prediabetes 05/15/2019   Iron deficiency anemia 05/15/2019   Systolic ejection murmur 05/15/2019   Hypoxemia requiring supplemental oxygen 05/15/2019   Normocytic anemia 04/29/2019   Chronic obstructive pulmonary disease (HCC) 05/30/2016   Rotator cuff tear 05/02/2016   Rotator cuff tendonitis, right 12/30/2015   Cervicalgia 12/30/2015   Eczema, dyshidrotic 11/18/2014   Acne 11/18/2014   Smoker 06/01/2014   Right knee DJD    Hyperlipidemia    HTN (hypertension) 06/05/2012     Current Outpatient Medications on File Prior to Visit  Medication Sig Dispense Refill   albuterol (PROVENTIL) (2.5 MG/3ML) 0.083% nebulizer solution Take 3 mLs (2.5 mg total) by nebulization every 6 (six) hours as needed for wheezing or shortness of breath. 150 mL 2   amLODipine (NORVASC) 10 MG tablet TAKE 1 TABLET EVERY DAY 90 tablet 0   aspirin EC 81 MG tablet Take 1 tablet (81 mg total) by mouth daily. 90 tablet 3   Blood Glucose Monitoring Suppl (TRUE METRIX METER) w/Device KIT Use as directed 1 kit 0   clotrimazole-betamethasone (LOTRISONE) cream APPLY CREAM TOPICALLY TO AFFECTED AREA TWICE DAILY 30 g 0   cromolyn (  OPTICROM) 4 % ophthalmic solution Place 1 drop into both eyes 4 (four) times daily.  98   ferrous sulfate (SV IRON) 325 (65 FE) MG tablet TAKE 1 TABLET BY MOUTH 3 TIMES WEEKLY *DOSE DECREASE 36 tablet 1   Fluticasone-Umeclidin-Vilant (TRELEGY ELLIPTA) 100-62.5-25 MCG/ACT AEPB Inhale 1 puff into the lungs daily. 1 each 11   Fluticasone-Umeclidin-Vilant (TRELEGY ELLIPTA) 100-62.5-25  MCG/ACT AEPB Inhale 1 puff into the lungs daily. 60 each 3   Fluticasone-Umeclidin-Vilant (TRELEGY ELLIPTA) 200-62.5-25 MCG/ACT AEPB Inhale 1 puff into the lungs daily. 60 each 5   glucose blood (TRUE METRIX BLOOD GLUCOSE TEST) test strip Use to check blood sugar once daily. 100 each 4   lisinopril-hydrochlorothiazide (ZESTORETIC) 20-25 MG tablet TAKE 1 TABLET EVERY DAY (NEED MD APPOINTMENT) 30 tablet 3   metFORMIN (GLUCOPHAGE) 500 MG tablet TAKE 1/2 TABLET ONCE DAILY WITH BREAKFAST 45 tablet 3   nicotine polacrilex (NICORETTE) 2 MG gum Take 1 each (2 mg total) by mouth as needed for smoking cessation. 100 tablet 3   TRUEplus Lancets 28G MISC Use as directed 100 each 4   cetirizine (ZYRTEC) 10 MG tablet Take 1 tablet (10 mg total) by mouth daily. (Patient not taking: Reported on 12/28/2022) 30 tablet 6   pantoprazole (PROTONIX) 40 MG tablet Take 1 tablet (40 mg total) by mouth daily. 90 tablet 0   No current facility-administered medications on file prior to visit.    Allergies  Allergen Reactions   Penicillins Itching and Other (See Comments)    Burning and whelps Has patient had a PCN reaction causing immediate rash, facial/tongue/throat swelling, SOB or lightheadedness with hypotension: NO Has patient had a PCN reaction causing severe rash involving mucus membranes or skin necrosis: NO Has patient had a PCN reaction that required hospitalization: NO Has patient had a PCN reaction occurring within the last 10 years: NO If all of the above answers are "NO", then may proceed with Cephalosporin use.    Social History   Socioeconomic History   Marital status: Legally Separated    Spouse name: Not on file   Number of children: 3   Years of education: Not on file   Highest education level: Not on file  Occupational History    Employer: [id number]  Tobacco Use   Smoking status: Every Day    Current packs/day: 0.00    Types: Cigarettes    Last attempt to quit: 08/13/2020    Years since  quitting: 2.3   Smokeless tobacco: Never   Tobacco comments:    Started smoking  Substance and Sexual Activity   Alcohol use: Yes    Alcohol/week: 0.0 standard drinks of alcohol    Comment: ocassionally   Drug use: No   Sexual activity: Never  Other Topics Concern   Not on file  Social History Narrative   Lives by herself now.    Social Drivers of Corporate investment banker Strain: Low Risk  (10/06/2022)   Overall Financial Resource Strain (CARDIA)    Difficulty of Paying Living Expenses: Not hard at all  Food Insecurity: No Food Insecurity (12/28/2022)   Hunger Vital Sign    Worried About Running Out of Food in the Last Year: Never true    Ran Out of Food in the Last Year: Never true  Transportation Needs: No Transportation Needs (12/28/2022)   PRAPARE - Administrator, Civil Service (Medical): No    Lack of Transportation (Non-Medical): No  Physical Activity: Sufficiently Active (12/28/2022)  Exercise Vital Sign    Days of Exercise per Week: 6 days    Minutes of Exercise per Session: 50 min  Recent Concern: Physical Activity - Insufficiently Active (10/06/2022)   Exercise Vital Sign    Days of Exercise per Week: 7 days    Minutes of Exercise per Session: 20 min  Stress: No Stress Concern Present (12/28/2022)   [employer] of Occupational Health - Occupational Stress Questionnaire    Feeling of Stress : Not at all  Social Connections: Socially Isolated (12/28/2022)   Social Connection and Isolation Panel [NHANES]    Frequency of Communication with Friends and Family: Three times a week    Frequency of Social Gatherings with Friends and Family: Twice a week    Attends Religious Services: Never    Database administrator or Organizations: No    Attends Banker Meetings: Never    Marital Status: Separated  Intimate Partner Violence: Not At Risk (12/28/2022)   Humiliation, Afraid, Rape, and Kick questionnaire    Fear of Current or Ex-Partner:  No    Emotionally Abused: No    Physically Abused: No    Sexually Abused: No    Family History  Problem Relation Age of Onset   CAD Father 28   CAD Mother 85   Hypertension Mother     Past Surgical History:  Procedure Laterality Date   ABDOMINAL HYSTERECTOMY     Athroscopic knee surgery     Bilateral   BREAST BIOPSY Right 03/2017   CARPAL TUNNEL RELEASE Right 05/02/2016   Procedure: CARPAL TUNNEL RELEASE;  Surgeon: Cammy Copa, MD;  Location: [facility] OR;  Service: Orthopedics;  Laterality: Right;   CHOLECYSTECTOMY     DILATION AND CURETTAGE OF UTERUS     EYE SURGERY Right    SHOULDER ARTHROSCOPY WITH SUBACROMIAL DECOMPRESSION Right 05/02/2016   Procedure: RIGHT SHOULDER ARTHROSCOPY WITH SUBACROMIAL DECOMPRESSION, MINI OPEN ROTATOR CUFF TEAR REPAIR, AND RIGHT CARPAL TUNNEL RELEASE;  Surgeon: Cammy Copa, MD;  Location: [facility] OR;  Service: Orthopedics;  Laterality: Right;   TOTAL KNEE ARTHROPLASTY Right 07/15/2012   Procedure: RIGHT TOTAL KNEE ARTHROPLASTY;  Surgeon: Nilda Simmer, MD;  Location: [facility] OR;  Service: Orthopedics;  Laterality: Right;    ROS: Review of Systems Negative except as stated above  PHYSICAL EXAM: BP 116/76 (BP Location: Left Arm, Patient Position: Sitting, Cuff Size: Normal)   Pulse 73   Temp 98.3 F (36.8 C) (Oral)   Ht 5\' 7"  (1.702 m)   Wt 181 lb (82.1 kg)   SpO2 95%   BMI 28.35 kg/m   Wt Readings from Last 3 Encounters:  12/28/22 181 lb (82.1 kg)  10/11/22 185 lb 6.4 oz (84.1 kg)  08/28/22 183 lb (83 kg)    Physical Exam  General appearance - alert, well appearing, older AAF and in no distress.  Has cane with her. Mental status - normal mood, behavior, speech, dress, motor activity, and thought processes Neck - supple, no significant adenopathy Chest - clear to auscultation, no wheezes, rales or rhonchi, symmetric air entry Heart - 1/6 SEM LT and RT upper sternal border Extremities - peripheral pulses normal, no pedal edema, no  clubbing or cyanosis  Results for orders placed or performed in visit on 12/28/22  POCT glucose (manual entry)   Collection Time: 12/28/22  2:13 PM  Result Value Ref Range   POC Glucose 125 (A) 70 - 99 mg/dl  POCT glycosylated hemoglobin (Hb A1C)  Collection Time: 12/28/22  3:05 PM  Result Value Ref Range   Hemoglobin A1C     HbA1c POC (<> result, manual entry)     HbA1c, POC (prediabetic range)     HbA1c, POC (controlled diabetic range) 5.6 0.0 - 7.0 %        Latest Ref Rng & Units 04/25/2022   12:00 PM 06/24/2021    4:51 PM 11/26/2019   10:51 AM  CMP  Glucose 70 - 99 mg/dL 409  811  99   BUN 8 - 27 mg/dL 13  17  13    Creatinine 0.57 - 1.00 mg/dL 9.14  7.82  9.56   Sodium 134 - 144 mmol/L 141  141  140   Potassium 3.5 - 5.2 mmol/L 4.2  4.3  4.6   Chloride 96 - 106 mmol/L 104  104  104   CO2 20 - 29 mmol/L 23  21  23    Calcium 8.7 - 10.3 mg/dL 21.3  08.6  57.8   Total Protein 6.0 - 8.5 g/dL 6.7  6.9  6.8   Total Bilirubin 0.0 - 1.2 mg/dL <4.6  <9.6  0.2   Alkaline Phos 44 - 121 IU/L 88  82  79   AST 0 - 40 IU/L 15  16  19    ALT 0 - 32 IU/L 17  13  18     Lipid Panel     Component Value Date/Time   CHOL 193 04/25/2022 1200   TRIG 140 04/25/2022 1200   HDL 60 04/25/2022 1200   CHOLHDL 3.2 04/25/2022 1200   CHOLHDL 4.0 03/17/2014 0922   VLDL 41 (H) 03/17/2014 0922   LDLCALC 109 (H) 04/25/2022 1200    CBC    Component Value Date/Time   WBC 5.6 04/25/2022 1200   WBC 7.0 05/07/2017 1422   RBC 4.34 04/25/2022 1200   RBC 4.03 05/07/2017 1422   HGB 13.5 04/25/2022 1200   HCT 40.8 04/25/2022 1200   PLT 296 04/25/2022 1200   MCV 94 04/25/2022 1200   MCH 31.1 04/25/2022 1200   MCH 30.5 05/07/2017 1422   MCHC 33.1 04/25/2022 1200   MCHC 34.4 05/07/2017 1422   RDW 11.0 (L) 04/25/2022 1200   LYMPHSABS 3,171 05/07/2017 1422   MONOABS 448 09/29/2015 1158   EOSABS 273 05/07/2017 1422   BASOSABS 42 05/07/2017 1422    ASSESSMENT AND PLAN:  1. Stage 1 mild COPD by GOLD  classification (HCC) (Primary) Encouraged her to use the Trelegy inhaler every day.  In doing so she may find that she does not have to use the nebulizer treatment as often.  2. Tobacco dependence She has struggled to quit smoking.  She still has the nicotine patches and gum and plans to use them when she is ready to give a trial of quitting again.  I also told her about Chantix and to let me know if she would like to try this medication but patient wants to hold off for now.  3. Essential hypertension At goal.  Continue Norvasc 10 mg daily, lisinopril/HCTZ 20/12.5 mg once a day.  4. Prediabetes Discussed with patient that we can stop the metformin and see if she maintains normal A1c with good eating habits.  Patient states she prefers to stay on the metformin 500 mg half a tablet daily. - POCT glycosylated hemoglobin (Hb A1C) - POCT glucose (manual entry)  5. Hyperlipidemia, unspecified hyperlipidemia type - lovastatin (MEVACOR) 40 MG tablet; Take 1 tablet (40 mg total) by  mouth at bedtime.  Dispense: 90 tablet; Refill: 2    Patient was given the opportunity to ask questions.  Patient verbalized understanding of the plan and was able to repeat key elements of the plan.   This documentation was completed using Paediatric nurse.  Any transcriptional errors are unintentional.  Orders Placed This Encounter  Procedures   POCT glycosylated hemoglobin (Hb A1C)   POCT glucose (manual entry)     Requested Prescriptions   Signed Prescriptions Disp Refills   lovastatin (MEVACOR) 40 MG tablet 90 tablet 2    Sig: Take 1 tablet (40 mg total) by mouth at bedtime.   albuterol (VENTOLIN HFA) 108 (90 Base) MCG/ACT inhaler 8 g 6    Sig: Inhale 2 puffs into the lungs every 6 (six) hours as needed for wheezing or shortness of breath.    Return in about 4 months (around 04/28/2023).  Jonah Blue, MD, FACP

## 2022-12-28 NOTE — Patient Instructions (Signed)
Let me know if and when you want to try Chantix.

## 2023-01-16 DIAGNOSIS — H2000 Unspecified acute and subacute iridocyclitis: Secondary | ICD-10-CM | POA: Diagnosis not present

## 2023-01-16 DIAGNOSIS — H04123 Dry eye syndrome of bilateral lacrimal glands: Secondary | ICD-10-CM | POA: Diagnosis not present

## 2023-01-16 DIAGNOSIS — H40013 Open angle with borderline findings, low risk, bilateral: Secondary | ICD-10-CM | POA: Diagnosis not present

## 2023-01-16 DIAGNOSIS — H2513 Age-related nuclear cataract, bilateral: Secondary | ICD-10-CM | POA: Diagnosis not present

## 2023-01-16 DIAGNOSIS — R7309 Other abnormal glucose: Secondary | ICD-10-CM | POA: Diagnosis not present

## 2023-01-16 DIAGNOSIS — H10413 Chronic giant papillary conjunctivitis, bilateral: Secondary | ICD-10-CM | POA: Diagnosis not present

## 2023-01-23 DIAGNOSIS — H40013 Open angle with borderline findings, low risk, bilateral: Secondary | ICD-10-CM | POA: Diagnosis not present

## 2023-01-23 DIAGNOSIS — H10413 Chronic giant papillary conjunctivitis, bilateral: Secondary | ICD-10-CM | POA: Diagnosis not present

## 2023-01-23 DIAGNOSIS — R7309 Other abnormal glucose: Secondary | ICD-10-CM | POA: Diagnosis not present

## 2023-01-23 DIAGNOSIS — H2513 Age-related nuclear cataract, bilateral: Secondary | ICD-10-CM | POA: Diagnosis not present

## 2023-01-23 DIAGNOSIS — H04123 Dry eye syndrome of bilateral lacrimal glands: Secondary | ICD-10-CM | POA: Diagnosis not present

## 2023-01-23 DIAGNOSIS — H2000 Unspecified acute and subacute iridocyclitis: Secondary | ICD-10-CM | POA: Diagnosis not present

## 2023-01-30 DIAGNOSIS — H2000 Unspecified acute and subacute iridocyclitis: Secondary | ICD-10-CM | POA: Diagnosis not present

## 2023-01-30 DIAGNOSIS — R7309 Other abnormal glucose: Secondary | ICD-10-CM | POA: Diagnosis not present

## 2023-01-30 DIAGNOSIS — H40013 Open angle with borderline findings, low risk, bilateral: Secondary | ICD-10-CM | POA: Diagnosis not present

## 2023-01-30 DIAGNOSIS — H10413 Chronic giant papillary conjunctivitis, bilateral: Secondary | ICD-10-CM | POA: Diagnosis not present

## 2023-01-30 DIAGNOSIS — H04123 Dry eye syndrome of bilateral lacrimal glands: Secondary | ICD-10-CM | POA: Diagnosis not present

## 2023-01-30 DIAGNOSIS — H2513 Age-related nuclear cataract, bilateral: Secondary | ICD-10-CM | POA: Diagnosis not present

## 2023-02-06 ENCOUNTER — Other Ambulatory Visit: Payer: Self-pay | Admitting: Internal Medicine

## 2023-02-06 DIAGNOSIS — H2000 Unspecified acute and subacute iridocyclitis: Secondary | ICD-10-CM | POA: Diagnosis not present

## 2023-02-06 DIAGNOSIS — H40013 Open angle with borderline findings, low risk, bilateral: Secondary | ICD-10-CM | POA: Diagnosis not present

## 2023-02-06 DIAGNOSIS — H40052 Ocular hypertension, left eye: Secondary | ICD-10-CM | POA: Diagnosis not present

## 2023-02-06 DIAGNOSIS — H10413 Chronic giant papillary conjunctivitis, bilateral: Secondary | ICD-10-CM | POA: Diagnosis not present

## 2023-02-06 DIAGNOSIS — H2513 Age-related nuclear cataract, bilateral: Secondary | ICD-10-CM | POA: Diagnosis not present

## 2023-02-06 DIAGNOSIS — I10 Essential (primary) hypertension: Secondary | ICD-10-CM

## 2023-02-06 DIAGNOSIS — H04123 Dry eye syndrome of bilateral lacrimal glands: Secondary | ICD-10-CM | POA: Diagnosis not present

## 2023-02-06 NOTE — Telephone Encounter (Signed)
Requested Prescriptions  Pending Prescriptions Disp Refills   lisinopril-hydrochlorothiazide (ZESTORETIC) 20-25 MG tablet [Pharmacy Med Name: Lisinopril-hydroCHLOROthiazide Oral Tablet 20-25 MG] 90 tablet 0    Sig: TAKE 1 TABLET EVERY DAY (NEED MD APPOINTMENT)     Cardiovascular:  ACEI + Diuretic Combos Failed - 02/06/2023  4:19 PM      Failed - Na in normal range and within 180 days    Sodium  Date Value Ref Range Status  04/25/2022 141 134 - 144 mmol/L Final         Failed - K in normal range and within 180 days    Potassium  Date Value Ref Range Status  04/25/2022 4.2 3.5 - 5.2 mmol/L Final         Failed - Cr in normal range and within 180 days    Creat  Date Value Ref Range Status  09/29/2015 0.85 0.50 - 0.99 mg/dL Final    Comment:      For patients > or = 68 years of age: The upper reference limit for Creatinine is approximately 13% higher for people identified as African-American.      Creatinine, Ser  Date Value Ref Range Status  04/25/2022 0.81 0.57 - 1.00 mg/dL Final         Failed - eGFR is 30 or above and within 180 days    GFR, Est African American  Date Value Ref Range Status  09/29/2015 86 >=60 mL/min Final   GFR calc Af Amer  Date Value Ref Range Status  11/26/2019 83 >59 mL/min/1.73 Final    Comment:    **In accordance with recommendations from the NKF-ASN Task force,**   [facility] is in the process of updating its eGFR calculation to the   2021 CKD-EPI creatinine equation that estimates kidney function   without a race variable.    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 [facility] Jonah Blue B, MD   5 months ago Chronic respiratory failure with hypoxia, on home oxygen therapy [facility])   [place] [facility] - [facility] Dept Of [place]. [facility] Marcine Matar, MD   9 months ago Stage 1 mild COPD by GOLD classification [facility])   [place] [facility] - A Dept Of [place]. [facility] Marcine Matar, MD   1 year ago Need for vaccination against Streptococcus pneumoniae   [facility] - A Dept Of [place]. [facility] Drucilla Chalet, RPH-CPP   1 year ago Essential hypertension   [place] [facility] [place] - A Dept Of [place]. [facility] Marcine Matar, MD  Future Appointments             In 2 months Laural Benes, Binnie Rail, MD [facility] [place] - A Dept Of [facility] Bride[facility]. [facility]

## 2023-02-15 DIAGNOSIS — Z0100 Encounter for examination of eyes and vision without abnormal findings: Secondary | ICD-10-CM | POA: Diagnosis not present

## 2023-02-19 DIAGNOSIS — H40052 Ocular hypertension, left eye: Secondary | ICD-10-CM | POA: Diagnosis not present

## 2023-02-19 DIAGNOSIS — H04123 Dry eye syndrome of bilateral lacrimal glands: Secondary | ICD-10-CM | POA: Diagnosis not present

## 2023-02-19 DIAGNOSIS — H40013 Open angle with borderline findings, low risk, bilateral: Secondary | ICD-10-CM | POA: Diagnosis not present

## 2023-02-19 DIAGNOSIS — H2513 Age-related nuclear cataract, bilateral: Secondary | ICD-10-CM | POA: Diagnosis not present

## 2023-02-19 DIAGNOSIS — H2000 Unspecified acute and subacute iridocyclitis: Secondary | ICD-10-CM | POA: Diagnosis not present

## 2023-02-19 DIAGNOSIS — H10413 Chronic giant papillary conjunctivitis, bilateral: Secondary | ICD-10-CM | POA: Diagnosis not present

## 2023-02-19 DIAGNOSIS — R7309 Other abnormal glucose: Secondary | ICD-10-CM | POA: Diagnosis not present

## 2023-02-20 DIAGNOSIS — R197 Diarrhea, unspecified: Secondary | ICD-10-CM | POA: Diagnosis not present

## 2023-02-20 DIAGNOSIS — H2513 Age-related nuclear cataract, bilateral: Secondary | ICD-10-CM | POA: Diagnosis not present

## 2023-02-20 DIAGNOSIS — H30033 Focal chorioretinal inflammation, peripheral, bilateral: Secondary | ICD-10-CM | POA: Diagnosis not present

## 2023-02-20 DIAGNOSIS — K589 Irritable bowel syndrome without diarrhea: Secondary | ICD-10-CM | POA: Diagnosis not present

## 2023-02-20 DIAGNOSIS — H3581 Retinal edema: Secondary | ICD-10-CM | POA: Diagnosis not present

## 2023-02-21 ENCOUNTER — Other Ambulatory Visit: Payer: Self-pay | Admitting: Internal Medicine

## 2023-02-21 DIAGNOSIS — K219 Gastro-esophageal reflux disease without esophagitis: Secondary | ICD-10-CM

## 2023-02-21 MED ORDER — PANTOPRAZOLE SODIUM 40 MG PO TBEC: 40.0000 mg | DELAYED_RELEASE_TABLET | Freq: Every day | ORAL | 0 refills | Status: DC

## 2023-02-28 DIAGNOSIS — K649 Unspecified hemorrhoids: Secondary | ICD-10-CM | POA: Diagnosis not present

## 2023-02-28 DIAGNOSIS — K5900 Constipation, unspecified: Secondary | ICD-10-CM | POA: Diagnosis not present

## 2023-02-28 DIAGNOSIS — K589 Irritable bowel syndrome without diarrhea: Secondary | ICD-10-CM | POA: Diagnosis not present

## 2023-02-28 DIAGNOSIS — K219 Gastro-esophageal reflux disease without esophagitis: Secondary | ICD-10-CM | POA: Diagnosis not present

## 2023-02-28 DIAGNOSIS — D649 Anemia, unspecified: Secondary | ICD-10-CM | POA: Diagnosis not present

## 2023-03-23 ENCOUNTER — Other Ambulatory Visit: Payer: Self-pay | Admitting: Internal Medicine

## 2023-03-23 DIAGNOSIS — I10 Essential (primary) hypertension: Secondary | ICD-10-CM

## 2023-04-02 ENCOUNTER — Other Ambulatory Visit: Payer: Self-pay | Admitting: Internal Medicine

## 2023-04-02 ENCOUNTER — Other Ambulatory Visit: Payer: Self-pay

## 2023-04-02 NOTE — Telephone Encounter (Signed)
 Copied from CRM [id number]. Topic: Clinical - Medication Refill >> Apr 02, 2023  3:55 PM Eunice Blase wrote: Most Recent Primary Care Visit:  Provider: Jonah Blue B  Department[facility]  Visit Type: OFFICE VISIT  Date: 12/28/2022  Medication: albuterol (VENTOLIN HFA) 108 (90 Base) MCG/ACT inhaler  Has the patient contacted their pharmacy? Yes (Agent: If no, request that the patient contact the pharmacy for the refill. If patient does not wish to contact the pharmacy document the reason why and proceed with request.) (Agent: If yes, when and what did the pharmacy advise?)Pharmacy need PCP approval Is this the correct pharmacy for this prescription? Yes If no, delete pharmacy and type the correct one.  This is the patient's preferred pharmacy:    [facility] - [place], [place] - [address] [place] [address] Phone: [phone] Fax: [phone]   Has the prescription been filled recently? Yes  Is the patient out of the medication? Yes  Has the patient been seen for an appointment in the last year OR does the patient have an upcoming appointment? Yes  Can we respond through MyChart? Yes  Agent: Please be advised that Rx refills may take up to 3 business days. We ask that you follow-up with your pharmacy.

## 2023-04-04 MED ORDER — ALBUTEROL SULFATE HFA 108 (90 BASE) MCG/ACT IN AERS: 2.0000 | INHALATION_SPRAY | Freq: Four times a day (QID) | RESPIRATORY_TRACT | 0 refills | Status: DC | PRN

## 2023-04-04 NOTE — Telephone Encounter (Signed)
 Requested Prescriptions  Pending Prescriptions Disp Refills   albuterol (VENTOLIN HFA) 108 (90 Base) MCG/ACT inhaler 8 g 0    Sig: Inhale 2 puffs into the lungs every 6 (six) hours as needed for wheezing or shortness of breath.     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 [facility] Jonah Blue B, MD   7 months ago Chronic respiratory failure with hypoxia, on home oxygen therapy [facility])   [place] [facility] - A Dept Of [place]. [facility] Marcine Matar, MD   11 months ago Stage 1 mild COPD by GOLD classification [facility])   [place] [facility] - A Dept Of [place]. [facility] Marcine Matar, MD   1 year ago Need for vaccination against Streptococcus pneumoniae   [place] [facility] - A Dept Of [place]. [facility] Drucilla Chalet, RPH-CPP   1 year ago Essential hypertension   [place] [facility] [place] - A Dept Of [place]. [facility] Marcine Matar, MD       Future Appointments             In 3 weeks Marcine Matar, MD [facility] [place] - A Dept Of [place]igha Bridegroom. [facility]

## 2023-04-05 ENCOUNTER — Other Ambulatory Visit: Payer: Self-pay | Admitting: Internal Medicine

## 2023-04-05 MED ORDER — ALBUTEROL SULFATE HFA 108 (90 BASE) MCG/ACT IN AERS: 2.0000 | INHALATION_SPRAY | Freq: Four times a day (QID) | RESPIRATORY_TRACT | 6 refills | Status: AC | PRN

## 2023-04-10 DIAGNOSIS — H3581 Retinal edema: Secondary | ICD-10-CM | POA: Diagnosis not present

## 2023-04-10 DIAGNOSIS — H30033 Focal chorioretinal inflammation, peripheral, bilateral: Secondary | ICD-10-CM | POA: Diagnosis not present

## 2023-04-10 DIAGNOSIS — H2513 Age-related nuclear cataract, bilateral: Secondary | ICD-10-CM | POA: Diagnosis not present

## 2023-04-25 ENCOUNTER — Other Ambulatory Visit: Payer: Self-pay | Admitting: Internal Medicine

## 2023-04-30 ENCOUNTER — Ambulatory Visit: Payer: Medicare HMO | Attending: Internal Medicine | Admitting: Internal Medicine

## 2023-04-30 ENCOUNTER — Encounter: Payer: Self-pay | Admitting: Internal Medicine

## 2023-04-30 VITALS — BP 101/64 | HR 66 | Temp 98.2°F | Ht 67.0 in | Wt 176.0 lb

## 2023-04-30 DIAGNOSIS — R7303 Prediabetes: Secondary | ICD-10-CM | POA: Insufficient documentation

## 2023-04-30 DIAGNOSIS — Z860100 Personal history of colon polyps, unspecified: Secondary | ICD-10-CM | POA: Diagnosis not present

## 2023-04-30 DIAGNOSIS — H3093 Unspecified chorioretinal inflammation, bilateral: Secondary | ICD-10-CM | POA: Insufficient documentation

## 2023-04-30 DIAGNOSIS — K581 Irritable bowel syndrome with constipation: Secondary | ICD-10-CM | POA: Insufficient documentation

## 2023-04-30 DIAGNOSIS — Z9981 Dependence on supplemental oxygen: Secondary | ICD-10-CM | POA: Diagnosis not present

## 2023-04-30 DIAGNOSIS — R4589 Other symptoms and signs involving emotional state: Secondary | ICD-10-CM | POA: Diagnosis not present

## 2023-04-30 DIAGNOSIS — F32A Depression, unspecified: Secondary | ICD-10-CM | POA: Insufficient documentation

## 2023-04-30 DIAGNOSIS — M1712 Unilateral primary osteoarthritis, left knee: Secondary | ICD-10-CM | POA: Insufficient documentation

## 2023-04-30 DIAGNOSIS — E785 Hyperlipidemia, unspecified: Secondary | ICD-10-CM | POA: Insufficient documentation

## 2023-04-30 DIAGNOSIS — D509 Iron deficiency anemia, unspecified: Secondary | ICD-10-CM | POA: Insufficient documentation

## 2023-04-30 DIAGNOSIS — J449 Chronic obstructive pulmonary disease, unspecified: Secondary | ICD-10-CM | POA: Insufficient documentation

## 2023-04-30 DIAGNOSIS — F17210 Nicotine dependence, cigarettes, uncomplicated: Secondary | ICD-10-CM | POA: Insufficient documentation

## 2023-04-30 DIAGNOSIS — Z9889 Other specified postprocedural states: Secondary | ICD-10-CM | POA: Insufficient documentation

## 2023-04-30 DIAGNOSIS — D508 Other iron deficiency anemias: Secondary | ICD-10-CM | POA: Diagnosis not present

## 2023-04-30 DIAGNOSIS — E559 Vitamin D deficiency, unspecified: Secondary | ICD-10-CM | POA: Diagnosis not present

## 2023-04-30 DIAGNOSIS — I10 Essential (primary) hypertension: Secondary | ICD-10-CM | POA: Diagnosis not present

## 2023-04-30 DIAGNOSIS — F17200 Nicotine dependence, unspecified, uncomplicated: Secondary | ICD-10-CM

## 2023-04-30 LAB — GLUCOSE, POCT (MANUAL RESULT ENTRY): POC Glucose: 110 mg/dL — AB (ref 70–99)

## 2023-04-30 MED ORDER — AMLODIPINE BESYLATE 5 MG PO TABS: 5.0000 mg | ORAL_TABLET | Freq: Every day | ORAL | 1 refills | Status: DC

## 2023-04-30 MED ORDER — FERROUS SULFATE 325 (65 FE) MG PO TABS: 325.0000 mg | ORAL_TABLET | ORAL | 1 refills | Status: DC

## 2023-04-30 MED ORDER — TRELEGY ELLIPTA 200-62.5-25 MCG/ACT IN AEPB: 1.0000 | INHALATION_SPRAY | Freq: Every day | RESPIRATORY_TRACT | 5 refills | Status: DC

## 2023-04-30 NOTE — Progress Notes (Signed)
 Patient ID: Alison Ford, female    DOB: Jan 04, 1956  MRN: [id number]  CC: Hypertension (HTN f/u. Med refill. /)   Subjective: Alison Ford is a 68 y.o. female who presents for chronic ds management. Her concerns today include:  Pt with hx of  HTN, preDM, HL, tob dep, COPD stage 1 (Dr. Gaynell Keeler), on home O2, IDA, Vit D def and s/p RT rotator cuff repair and RT CTS release, LT OA knee, chronic constipation, IBS, history of colon polyps.   Discussed the use of AI scribe software for clinical note transcription with the patient, who gave verbal consent to proceed.  History of Present Illness   The patient, with a history of hypertension, COPD, and anemia, presents for a four-month follow-up visit. She reports taking her prescribed medications, including amlodipine 10mg , lisinopril-hydrochlorothiazide 20/25mg , both daily and iron supplements 3x/wk. She has been monitoring her blood pressure at home, noting occasional dizziness, which prompts her to check her blood pressure. The most recent reading was 121/74 two weeks ago.  Most recent hemoglobin was done by her ophthalmologist in February and level was 12.4.  The patient also reports new ocular issues, including inflammation (chorioretinal inflammation bilaterally) and cataracts in both eyes. She is currently under the care of an ophthalmologist, Dr. Mason Sole, and is taking several prescribed eye drops and oral medications, including azathioprine and Diamox.  She does not remember the name of her eyedrops and did not bring her medicines with her today.  She is scheduled for a follow-up appointment with her ophthalmologist in four days.  COPD/Tob dep:  Regarding her COPD, the patient has been out of her Trelegy inhaler for a month but has been managing her symptoms with Albuterol inhaler and her  nebulizer. She reports using her albuterol inhaler daily, particularly when walking  and her nebulizer 1-3 times a week. She has noticed a decrease in  wheezing and coughing.  She still continues to smoke and is a bit vague in stating how much.  She states that she is currently working on trying to quit.  Does not feel she needs any medication for it at this time.  The patient also mentions feeling "slowed down" and expresses interest in mental health resources, suggesting she may be experiencing symptoms of depression. She denies any recent falls at home but expresses concern about the possibility due to her vision issues.   Patient with previous history of prediabetes.  A1c checked on last visit was normal.  No longer taking metformin.       Patient Active Problem List   Diagnosis Date Noted   Left adrenal mass (HCC) 04/25/2022   Angiodysplasia of stomach and duodenum without bleeding 12/14/2021   COVID-19 12/28/2020   Class 1 obesity due to excess calories with serious comorbidity and body mass index (BMI) of 31.0 to 31.9 in adult 05/15/2019   Prediabetes 05/15/2019   Iron deficiency anemia 05/15/2019   Systolic ejection murmur 05/15/2019   Hypoxemia requiring supplemental oxygen 05/15/2019   Normocytic anemia 04/29/2019   Chronic obstructive pulmonary disease (HCC) 05/30/2016   Rotator cuff tear 05/02/2016   Rotator cuff tendonitis, right 12/30/2015   Cervicalgia 12/30/2015   Eczema, dyshidrotic 11/18/2014   Acne 11/18/2014   Smoker 06/01/2014   Right knee DJD    Hyperlipidemia    HTN (hypertension) 06/05/2012     Current Outpatient Medications on File Prior to Visit  Medication Sig Dispense Refill   acetaZOLAMIDE (DIAMOX) 250 MG tablet Take 500 mg by  mouth 2 (two) times daily.     albuterol (PROVENTIL) (2.5 MG/3ML) 0.083% nebulizer solution Take 3 mLs (2.5 mg total) by nebulization every 6 (six) hours as needed for wheezing or shortness of breath. 150 mL 2   albuterol (VENTOLIN HFA) 108 (90 Base) MCG/ACT inhaler Inhale 2 puffs into the lungs every 6 (six) hours as needed for wheezing or shortness of breath. 18 g 6   aspirin  EC 81 MG tablet Take 1 tablet (81 mg total) by mouth daily. 90 tablet 3   azaTHIOprine (IMURAN) 50 MG tablet Take 100 mg by mouth daily.     Blood Glucose Monitoring Suppl (TRUE METRIX METER) w/Device KIT Use as directed 1 kit 0   brimonidine (ALPHAGAN) 0.2 % ophthalmic solution Place into both eyes.     cetirizine (ZYRTEC) 10 MG tablet Take 1 tablet (10 mg total) by mouth daily. 30 tablet 6   clotrimazole-betamethasone (LOTRISONE) cream APPLY CREAM TOPICALLY TO AFFECTED AREA TWICE DAILY 30 g 0   cromolyn (OPTICROM) 4 % ophthalmic solution Place 1 drop into both eyes 4 (four) times daily.  98   Fluticasone-Umeclidin-Vilant (TRELEGY ELLIPTA) 100-62.5-25 MCG/ACT AEPB Inhale 1 puff into the lungs daily. 1 each 11   glucose blood (TRUE METRIX BLOOD GLUCOSE TEST) test strip Use to check blood sugar once daily. 100 each 4   latanoprost (XALATAN) 0.005 % ophthalmic solution Place 1 drop into both eyes.     lisinopril-hydrochlorothiazide (ZESTORETIC) 20-25 MG tablet TAKE 1 TABLET EVERY DAY (NEED MD APPOINTMENT) 90 tablet 0   lovastatin (MEVACOR) 40 MG tablet Take 1 tablet (40 mg total) by mouth at bedtime. 90 tablet 2   nicotine polacrilex (NICORETTE) 2 MG gum Take 1 each (2 mg total) by mouth as needed for smoking cessation. 100 tablet 3   pantoprazole (PROTONIX) 40 MG tablet Take 1 tablet (40 mg total) by mouth daily. 90 tablet 0   TRUEplus Lancets 28G MISC Use as directed 100 each 4   No current facility-administered medications on file prior to visit.    Allergies  Allergen Reactions   Penicillins Itching and Other (See Comments)    Burning and whelps Has patient had a PCN reaction causing immediate rash, facial/tongue/throat swelling, SOB or lightheadedness with hypotension: NO Has patient had a PCN reaction causing severe rash involving mucus membranes or skin necrosis: NO Has patient had a PCN reaction that required hospitalization: NO Has patient had a PCN reaction occurring within the last  10 years: NO If all of the above answers are "NO", then may proceed with Cephalosporin use.    Social History   Socioeconomic History   Marital status: Legally Separated    Spouse name: Not on file   Number of children: 3   Years of education: Not on file   Highest education level: Not on file  Occupational History    Employer: [employer]  Tobacco Use   Smoking status: Every Day    Current packs/day: 0.00    Types: Cigarettes    Last attempt to quit: 08/13/2020    Years since quitting: 2.7   Smokeless tobacco: Never   Tobacco comments:    Started smoking  Substance and Sexual Activity   Alcohol use: Yes    Alcohol/week: 0.0 standard drinks of alcohol    Comment: ocassionally   Drug use: No   Sexual activity: Never  Other Topics Concern   Not on file  Social History Narrative   Lives by herself now.  Social Drivers of Corporate investment banker Strain: Low Risk  (10/06/2022)   Overall Financial Resource Strain (CARDIA)    Difficulty of Paying Living Expenses: Not hard at all  Food Insecurity: No Food Insecurity (12/28/2022)   Hunger Vital Sign    Worried About Running Out of Food in the Last Year: Never true    Ran Out of Food in the Last Year: Never true  Transportation Needs: No Transportation Needs (12/28/2022)   PRAPARE - Administrator, Civil Service (Medical): No    Lack of Transportation (Non-Medical): No  Physical Activity: Sufficiently Active (12/28/2022)   Exercise Vital Sign    Days of Exercise per Week: 6 days    Minutes of Exercise per Session: 50 min  Recent Concern: Physical Activity - Insufficiently Active (10/06/2022)   Exercise Vital Sign    Days of Exercise per Week: 7 days    Minutes of Exercise per Session: 20 min  Stress: No Stress Concern Present (12/28/2022)   [employer] of Occupational Health - Occupational Stress Questionnaire    Feeling of Stress : Not at all  Social Connections: Socially Isolated (12/28/2022)    Social Connection and Isolation Panel [NHANES]    Frequency of Communication with Friends and Family: Three times a week    Frequency of Social Gatherings with Friends and Family: Twice a week    Attends Religious Services: Never    Database administrator or Organizations: No    Attends Banker Meetings: Never    Marital Status: Separated  Intimate Partner Violence: Not At Risk (12/28/2022)   Humiliation, Afraid, Rape, and Kick questionnaire    Fear of Current or Ex-Partner: No    Emotionally Abused: No    Physically Abused: No    Sexually Abused: No    Family History  Problem Relation Age of Onset   CAD Father 12   CAD Mother 39   Hypertension Mother     Past Surgical History:  Procedure Laterality Date   ABDOMINAL HYSTERECTOMY     Athroscopic knee surgery     Bilateral   BREAST BIOPSY Right 03/2017   CARPAL TUNNEL RELEASE Right 05/02/2016   Procedure: CARPAL TUNNEL RELEASE;  Surgeon: Jasmine Mesi, MD;  Location: [facility] OR;  Service: Orthopedics;  Laterality: Right;   CHOLECYSTECTOMY     DILATION AND CURETTAGE OF UTERUS     EYE SURGERY Right    SHOULDER ARTHROSCOPY WITH SUBACROMIAL DECOMPRESSION Right 05/02/2016   Procedure: RIGHT SHOULDER ARTHROSCOPY WITH SUBACROMIAL DECOMPRESSION, MINI OPEN ROTATOR CUFF TEAR REPAIR, AND RIGHT CARPAL TUNNEL RELEASE;  Surgeon: Jasmine Mesi, MD;  Location: [facility] OR;  Service: Orthopedics;  Laterality: Right;   TOTAL KNEE ARTHROPLASTY Right 07/15/2012   Procedure: RIGHT TOTAL KNEE ARTHROPLASTY;  Surgeon: Genevie Kerns, MD;  Location: [facility] OR;  Service: Orthopedics;  Laterality: Right;    ROS: Review of Systems Negative except as stated above  PHYSICAL EXAM: BP 101/64 (BP Location: Left Arm, Patient Position: Sitting, Cuff Size: Normal)   Pulse 66   Temp 98.2 F (36.8 C) (Oral)   Ht 5\' 7"  (1.702 m)   Wt 176 lb (79.8 kg)   SpO2 98%   BMI 27.57 kg/m   Wt Readings from Last 3 Encounters:  04/30/23 176 lb (79.8 kg)   12/28/22 181 lb (82.1 kg)  10/11/22 185 lb 6.4 oz (84.1 kg)    Physical Exam  General appearance - alert, well appearing, and in no distress  Mental status - flat affect Chest - clear to auscultation, no wheezes, rales or rhonchi, symmetric air entry Heart - RRR, 2/6 SEM LSB Extremities - peripheral pulses normal, no pedal edema, no clubbing or cyanosis     04/30/2023    4:31 PM 04/30/2023    3:51 PM 12/28/2022    2:11 PM  Depression screen PHQ 2/9  Decreased Interest 1 0 0  Down, Depressed, Hopeless 0 0 0  PHQ - 2 Score 1 0 0  Altered sleeping 1 0 0  Tired, decreased energy 1 0 0  Change in appetite 0 0 0  Feeling bad or failure about yourself  0 0 0  Trouble concentrating 0 0 0  Moving slowly or fidgety/restless 0 0 0  Suicidal thoughts 0 0 0  PHQ-9 Score 3 0 0  Difficult doing work/chores Somewhat difficult Not difficult at all Not difficult at all        Latest Ref Rng & Units 04/25/2022   12:00 PM 06/24/2021    4:51 PM 11/26/2019   10:51 AM  CMP  Glucose 70 - 99 mg/dL 161  096  99   BUN 8 - 27 mg/dL 13  17  13    Creatinine 0.57 - 1.00 mg/dL 0.45  4.09  8.11   Sodium 134 - 144 mmol/L 141  141  140   Potassium 3.5 - 5.2 mmol/L 4.2  4.3  4.6   Chloride 96 - 106 mmol/L 104  104  104   CO2 20 - 29 mmol/L 23  21  23    Calcium 8.7 - 10.3 mg/dL 91.4  78.2  95.6   Total Protein 6.0 - 8.5 g/dL 6.7  6.9  6.8   Total Bilirubin 0.0 - 1.2 mg/dL <2.1  <3.0  0.2   Alkaline Phos 44 - 121 IU/L 88  82  79   AST 0 - 40 IU/L 15  16  19    ALT 0 - 32 IU/L 17  13  18     Lipid Panel     Component Value Date/Time   CHOL 193 04/25/2022 1200   TRIG 140 04/25/2022 1200   HDL 60 04/25/2022 1200   CHOLHDL 3.2 04/25/2022 1200   CHOLHDL 4.0 03/17/2014 0922   VLDL 41 (H) 03/17/2014 0922   LDLCALC 109 (H) 04/25/2022 1200    CBC    Component Value Date/Time   WBC 5.6 04/25/2022 1200   WBC 7.0 05/07/2017 1422   RBC 4.34 04/25/2022 1200   RBC 4.03 05/07/2017 1422   HGB 13.5  04/25/2022 1200   HCT 40.8 04/25/2022 1200   PLT 296 04/25/2022 1200   MCV 94 04/25/2022 1200   MCH 31.1 04/25/2022 1200   MCH 30.5 05/07/2017 1422   MCHC 33.1 04/25/2022 1200   MCHC 34.4 05/07/2017 1422   RDW 11.0 (L) 04/25/2022 1200   LYMPHSABS 3,171 05/07/2017 1422   MONOABS 448 09/29/2015 1158   EOSABS 273 05/07/2017 1422   BASOSABS 42 05/07/2017 1422   Results for orders placed or performed in visit on 04/30/23  POCT glucose (manual entry)   Collection Time: 04/30/23  3:50 PM  Result Value Ref Range   POC Glucose 110 (A) 70 - 99 mg/dl    ASSESSMENT AND PLAN: 1. Essential hypertension (Primary) Blood pressure in the low normal range.  Given that she has been started on Diamox by her ophthalmologist and reports some dizziness at times, I think we can decrease the dose of amlodipine from 10 mg  daily to 5 mg daily.  Continue lisinopril/HCTZ.  Advised to check blood pressure at least about twice a week for the next 2 to 3 weeks to make sure she is staying at goal which is 130/80 or lower. - amLODipine (NORVASC) 5 MG tablet; Take 1 tablet (5 mg total) by mouth daily.  Dispense: 90 tablet; Refill: 1  2. Stage 1 mild COPD by GOLD classification (HCC) Stable.  Refill given on Trelegy inhaler. - Fluticasone-Umeclidin-Vilant (TRELEGY ELLIPTA) 200-62.5-25 MCG/ACT AEPB; Inhale 1 puff into the lungs daily.  Dispense: 60 each; Refill: 5  3. Tobacco dependence Strongly advised to quit.  Patient states that she is still working on this.  4. Prediabetes Blood sugar today is good.  Encourage healthy eating habits. - POCT glucose (manual entry)  5. Hyperlipidemia, unspecified hyperlipidemia type Continue taking the lovastatin 40 mg daily.  6. Chorioretinal inflammation of both eyes Followed by her retina specialist.  Adding the azathioprine and Diamox to her current med list.  Advised to bring all medicines with her at subsequent visit.  7. Other iron deficiency anemia - ferrous sulfate  (SV IRON) 325 (65 FE) MG tablet; Take 1 tablet (325 mg total) by mouth 3 (three) times a week.  Dispense: 36 tablet; Refill: 1  8. Depressed mood Referral submitted to behavioral health for counseling.     Patient was given the opportunity to ask questions.  Patient verbalized understanding of the plan and was able to repeat key elements of the plan.   This documentation was completed using Paediatric nurse.  Any transcriptional errors are unintentional.  Orders Placed This Encounter  Procedures   Ambulatory referral to Psychiatry   POCT glucose (manual entry)     Requested Prescriptions   Signed Prescriptions Disp Refills   ferrous sulfate (SV IRON) 325 (65 FE) MG tablet 36 tablet 1    Sig: Take 1 tablet (325 mg total) by mouth 3 (three) times a week.   Fluticasone-Umeclidin-Vilant (TRELEGY ELLIPTA) 200-62.5-25 MCG/ACT AEPB 60 each 5    Sig: Inhale 1 puff into the lungs daily.   amLODipine (NORVASC) 5 MG tablet 90 tablet 1    Sig: Take 1 tablet (5 mg total) by mouth daily.    Return in about 4 months (around 08/30/2023).  Concetta Dee, MD, FACP

## 2023-05-01 ENCOUNTER — Other Ambulatory Visit: Payer: Self-pay | Admitting: Internal Medicine

## 2023-05-01 DIAGNOSIS — I10 Essential (primary) hypertension: Secondary | ICD-10-CM

## 2023-05-04 DIAGNOSIS — H30033 Focal chorioretinal inflammation, peripheral, bilateral: Secondary | ICD-10-CM | POA: Diagnosis not present

## 2023-05-04 DIAGNOSIS — H2513 Age-related nuclear cataract, bilateral: Secondary | ICD-10-CM | POA: Diagnosis not present

## 2023-05-07 ENCOUNTER — Other Ambulatory Visit: Payer: Self-pay | Admitting: Internal Medicine

## 2023-05-07 DIAGNOSIS — K219 Gastro-esophageal reflux disease without esophagitis: Secondary | ICD-10-CM

## 2023-06-12 IMAGING — MR MR SHOULDER*R* W/CM
6 series · 37 of 40 positions shown · IV contrast (agent unspecified)
Comparison: 11/23/2015

CLINICAL DATA: Right shoulder pain, tightness, numbness, decreased
range of motion, prior rotator cuff repair

EXAM:
MR ARTHROGRAM OF THE RIGHT SHOULDER
TECHNIQUE: Multiplanar, multisequence MR imaging of the right shoulder was
performed following the administration of intra-articular contrast.
CONTRAST:  See Injection Documentation.

[Series 3: T1 fat-sat · axial · 4.0mm · 0.29mm/px · z∈[-23,+76]mm · 7 of 21 slices shown (1 of 4)]
[im 1/21]
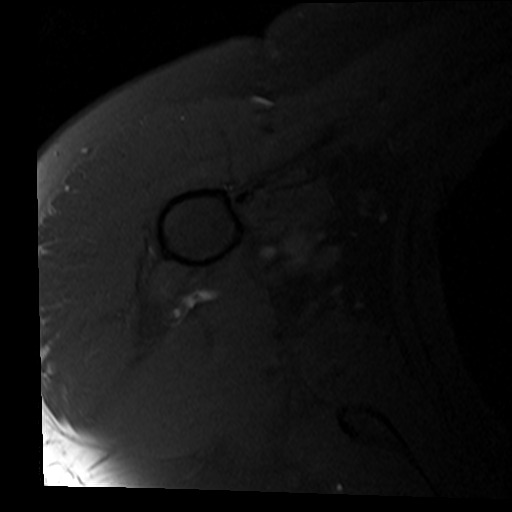
[im 4/21]
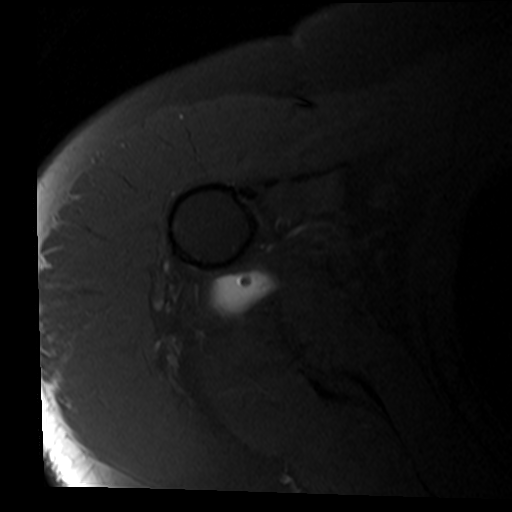
[im 7/21]
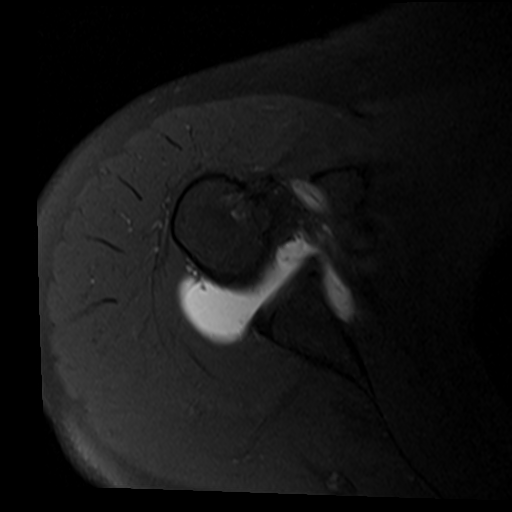
[im 11/21]
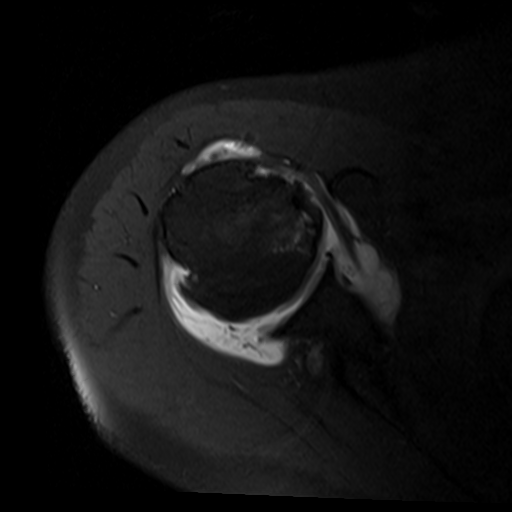
[im 14/21]
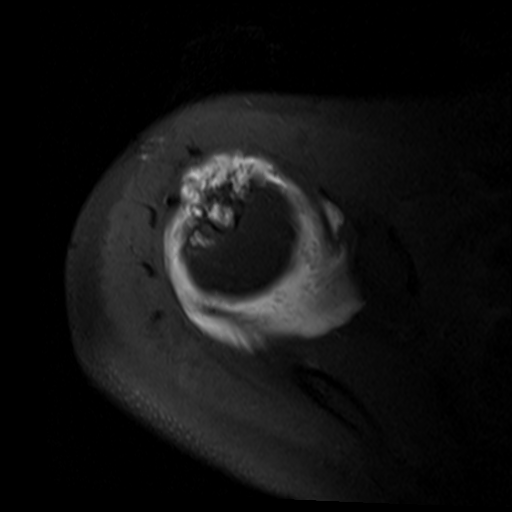
[im 17/21]
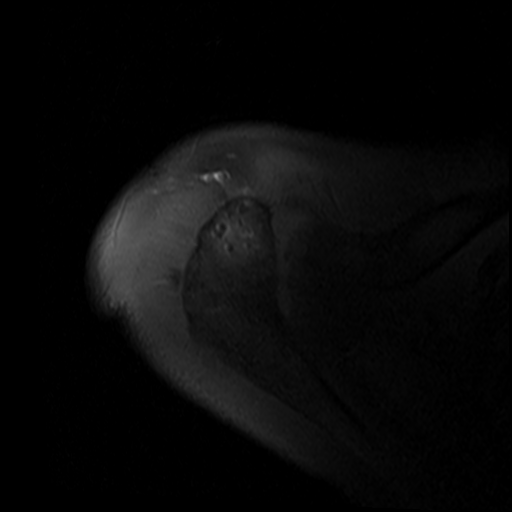
[im 21/21]
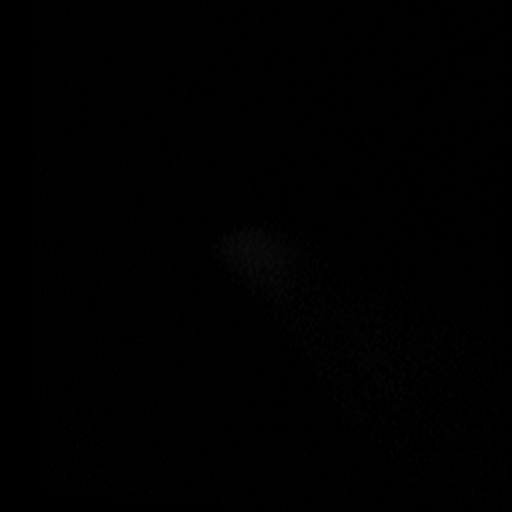

[Series 4: T2 fat-sat · oblique · 4.0mm · 0.59mm/px · 8 of 24 slices shown (1 of 2)]
[im 1/24]
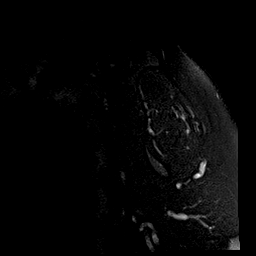
[im 4/24]
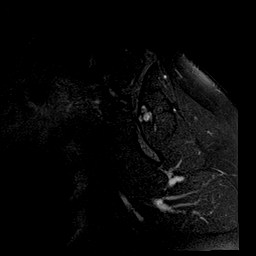
[im 7/24]
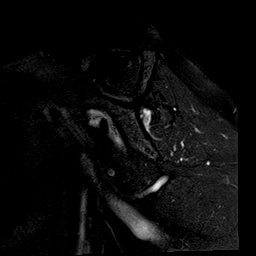
[im 10/24]
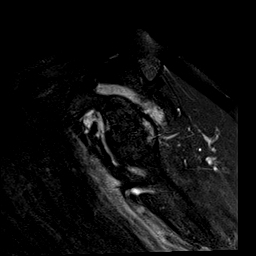
[im 14/24]
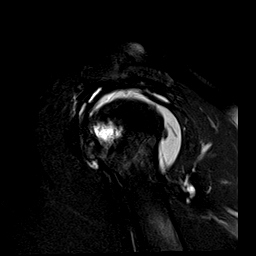
[im 17/24]
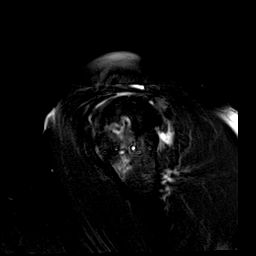
[im 20/24]
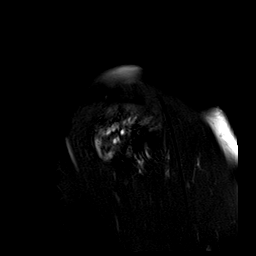
[im 24/24]
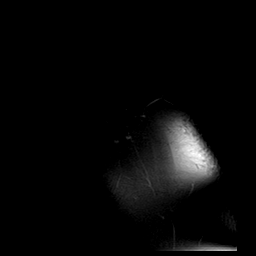

[Series 5: T1 fat-sat · oblique · 4.0mm · 0.59mm/px · 6 of 20 slices shown (2 of 4)]
[im 1/20]
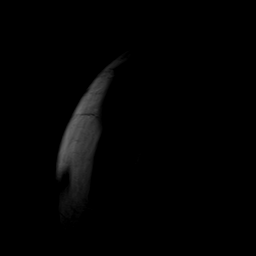
[im 4/20]
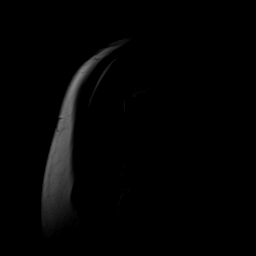
[im 8/20]
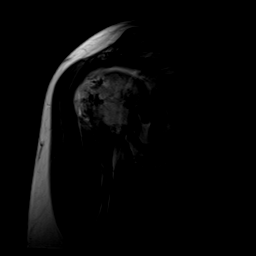
[im 12/20]
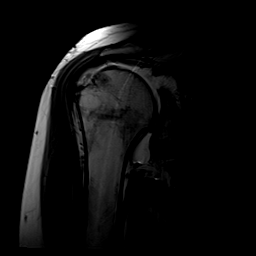
[im 16/20]
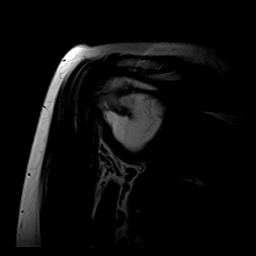
[im 20/20]
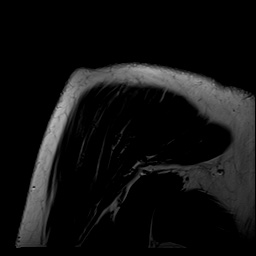

[Series 6: T1 fat-sat · oblique · 4.0mm · 0.59mm/px · 6 of 20 slices shown (3 of 4)]
[im 1/20]
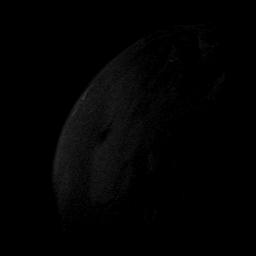
[im 4/20]
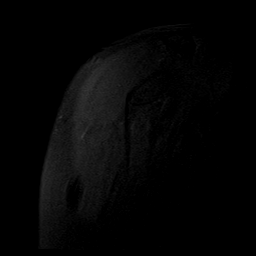
[im 8/20]
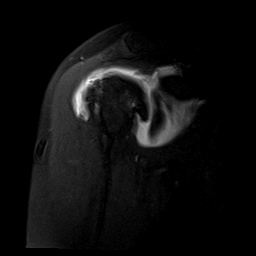
[im 12/20]
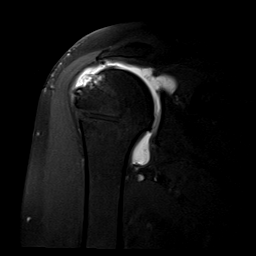
[im 16/20]
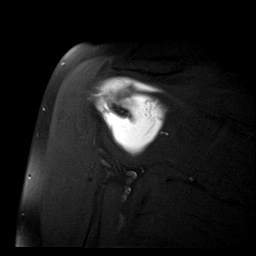
[im 20/20]
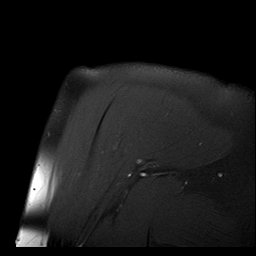

[Series 7: T2 fat-sat · oblique · 4.0mm · 0.59mm/px · 6 of 20 slices shown (2 of 2)]
[im 1/20]
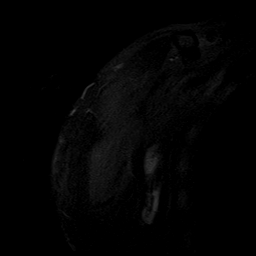
[im 4/20]
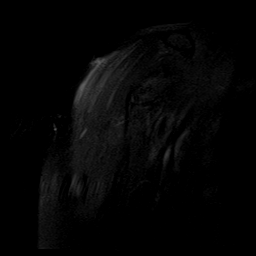
[im 8/20]
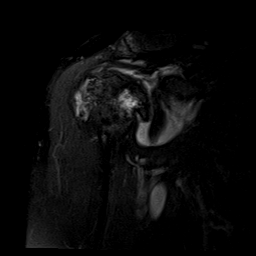
[im 12/20]
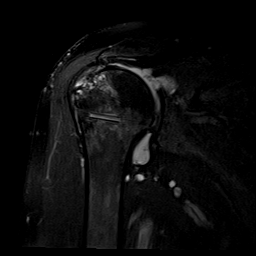
[im 16/20]
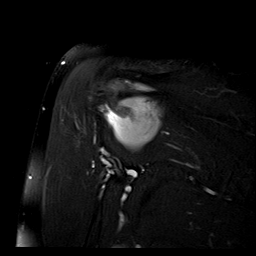
[im 20/20]
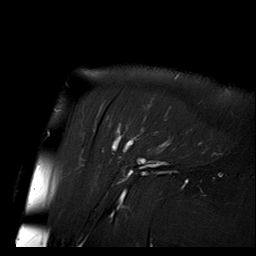

[Series 8: T1 fat-sat · axial · 4.0mm · 0.29mm/px · z∈[-23,+27]mm · 4 of 21 slices shown (4 of 4)]
[im 1/21]
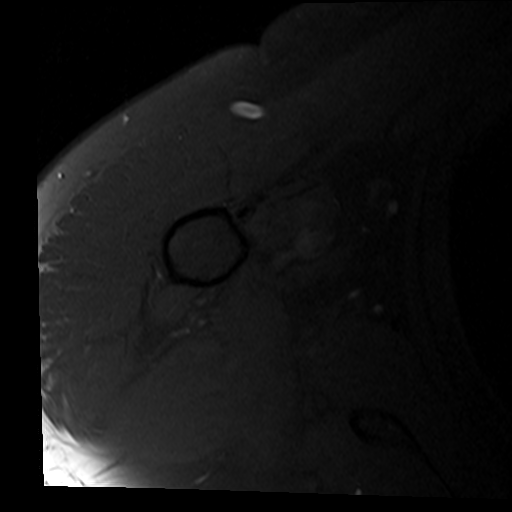
[im 4/21]
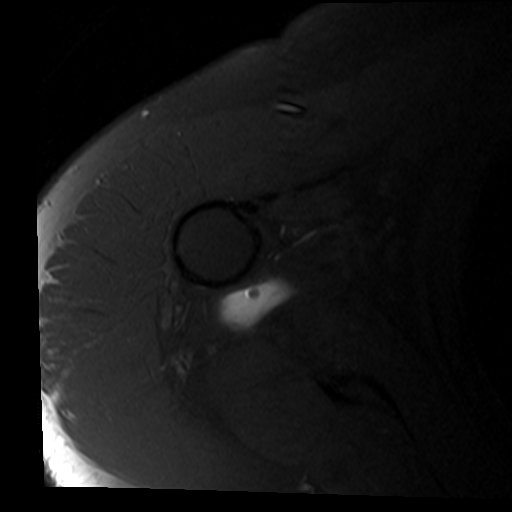
[im 7/21]
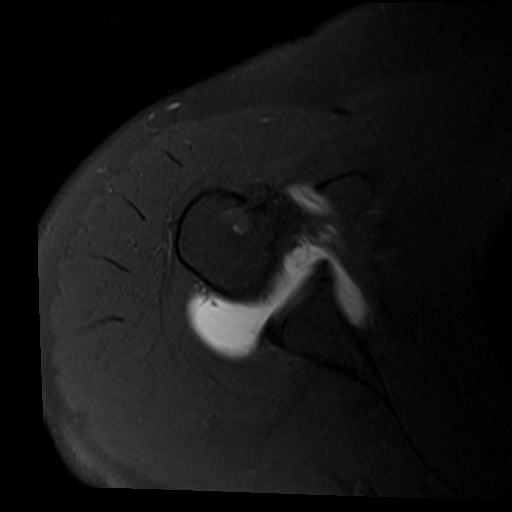
[im 11/21]
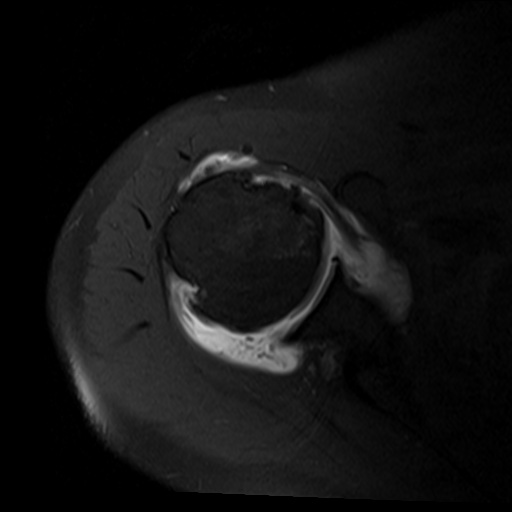

[37 of 40 positions shown; findings below may reference images not displayed]

FINDINGS: Rotator cuff: Prior rotator cuff repair. Complete tear of the
supraspinatus and infraspinatus tendons with 3.8 cm of retraction.
Teres minor tendon is intact. Mild tendinosis of the subscapularis
tendon.

Muscles: No muscle atrophy or edema. No intramuscular fluid
collection or hematoma.

Biceps Long Head: Prior tenodesis.

Acromioclavicular Joint: Moderate arthropathy of the
acromioclavicular joint.

Glenohumeral Joint: Intraarticular contrast distending the joint
capsule. Mild synovitis. Intact glenohumeral ligaments.
Partial-thickness cartilage loss of the glenohumeral joint with
areas of full-thickness cartilage loss of the upper glenoid
consistent with moderate-severe osteoarthritis.

Labrum: Attenuation of the superior labrum likely reflecting severe
degeneration versus prior debridement.

Bones: No fracture or dislocation. No aggressive osseous lesion.

Other: No fluid collection or hematoma.
IMPRESSION: 1. Prior rotator cuff repair. Complete tear of the supraspinatus and
infraspinatus tendons with 3.8 cm of retraction.
2. Moderate-severe osteoarthritis of the right glenohumeral joint.

## 2023-06-12 IMAGING — XA DG FLUORO GUIDE NDL PLC/BX
3 series · 3 of 3 positions shown · non-contrast
Comparison: none

CLINICAL DATA: Pain and limited range of motion.

[Series 1: ortho standard · 1 of 1 slices shown (1 of 3)]
[im 1/1]
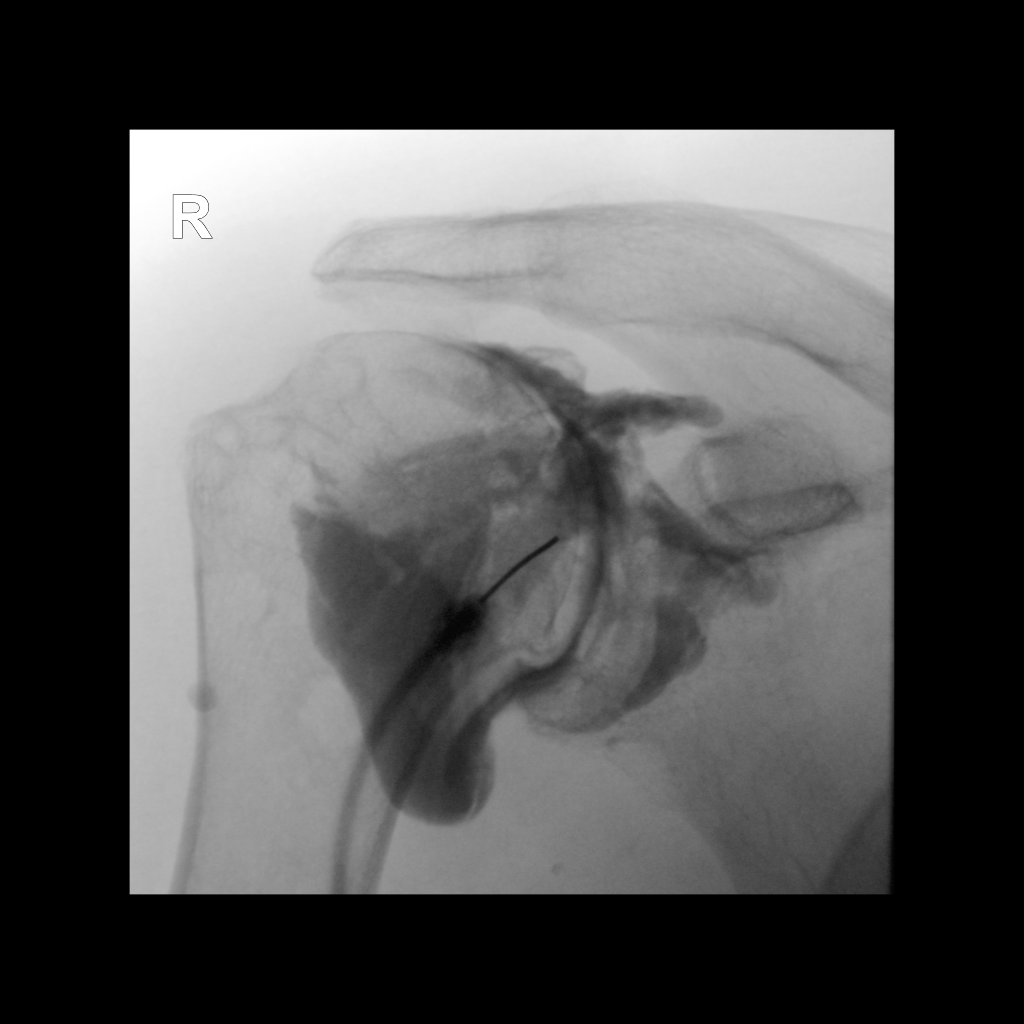

[Series 2: ortho standard · 1 of 1 slices shown (2 of 3)]
[im 1/1]
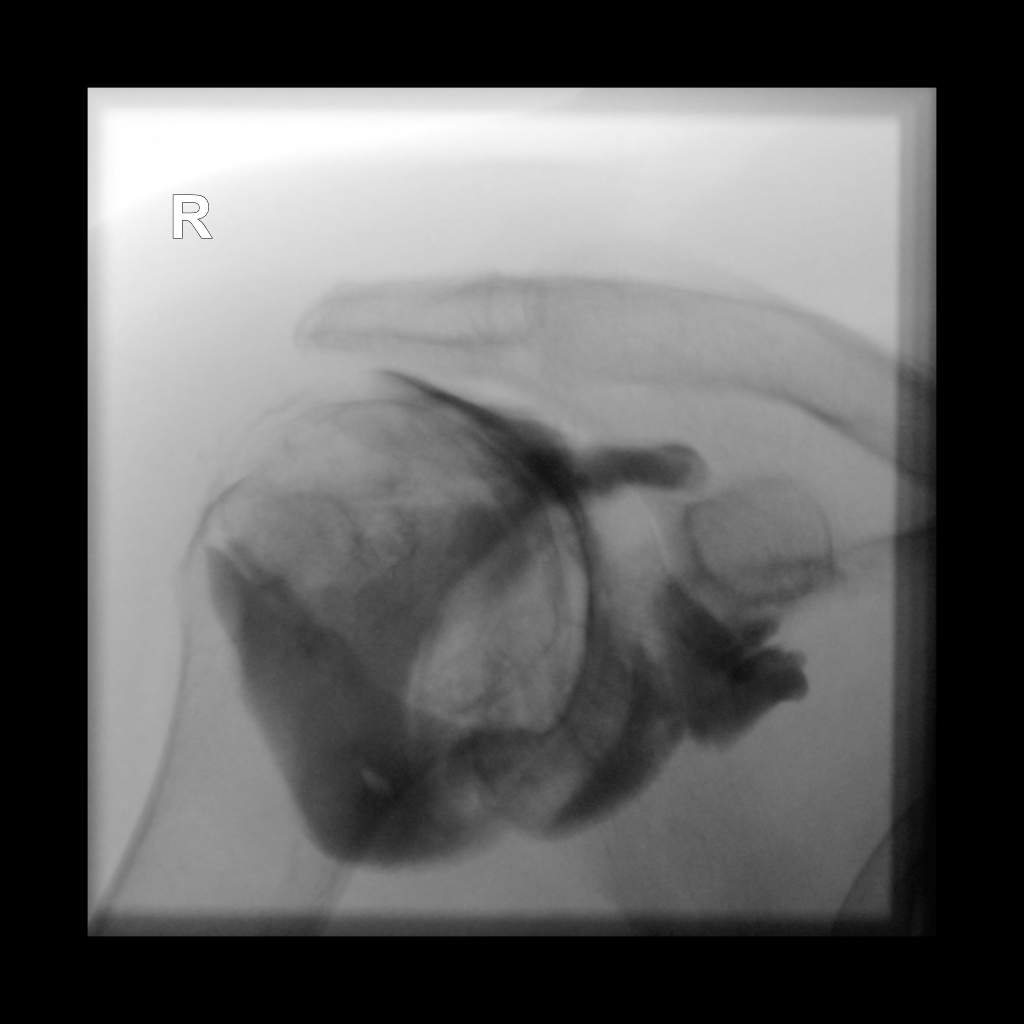

[Series 3: ortho standard · 1 of 1 slices shown (3 of 3)]
[im 1/1]
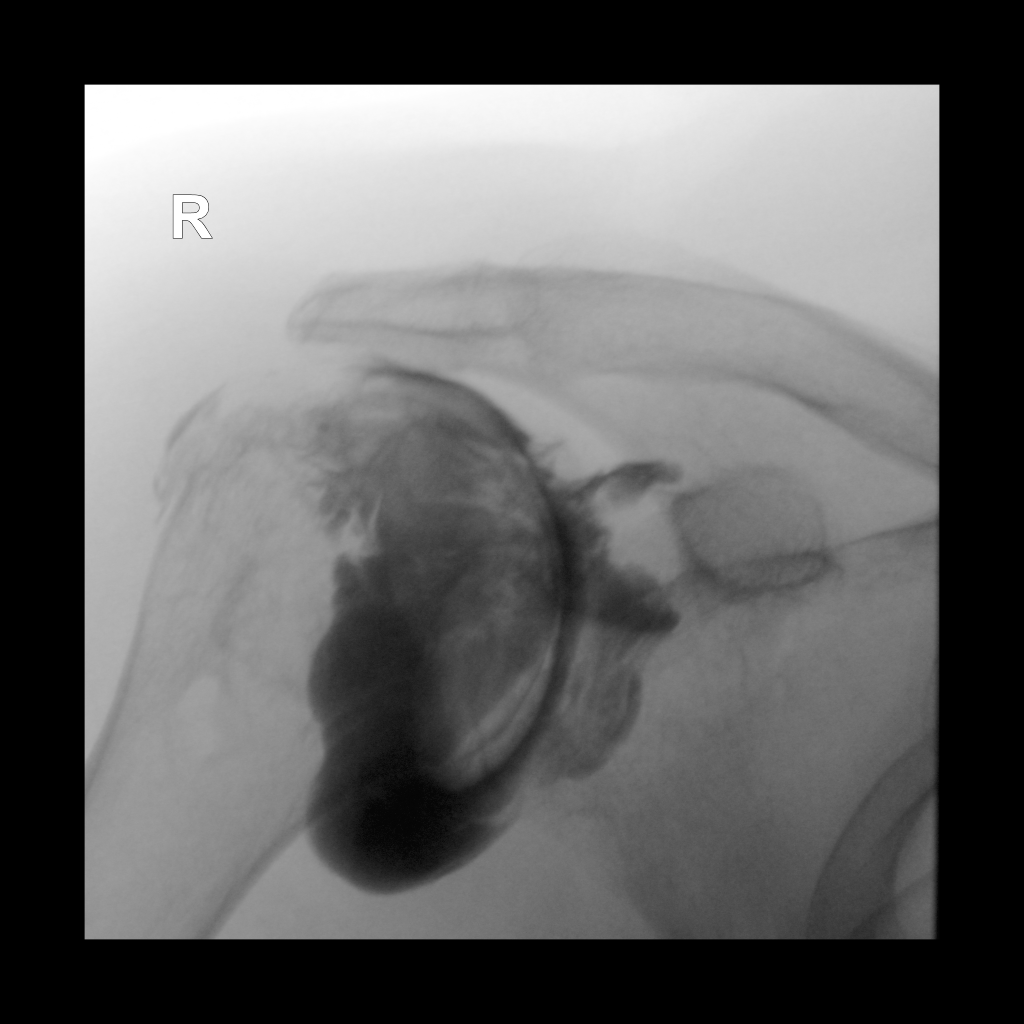

[3 of 3 positions shown; findings below may reference images not displayed]

FLUOROSCOPY TIME:  0 minutes 40 seconds. 29.91 micro gray meter
squared

PROCEDURE:
Right SHOULDER INJECTION UNDER FLUOROSCOPY

The skin overlying the shoulder was scrubbed with Betadine and
draped in sterile fashion. Skin and subcutaneous anesthesia was
carried out using a 25 gauge needle and 1% lidocaine. A 22 gauge
spinal needle was directed under fluoroscopic guidance on one pass
into the glenohumeral joint. 20 cc of a mixture of 0.1 cc MultiHance
and dilute Isovue 200 was then used to fill the glenohumeral joint.
IMPRESSION: Technically successful right shoulder injection for MRI. Limited
filming shows a full-thickness rotator cuff tear. Possible synovitis
pattern.

## 2023-06-21 DIAGNOSIS — H10413 Chronic giant papillary conjunctivitis, bilateral: Secondary | ICD-10-CM | POA: Diagnosis not present

## 2023-06-21 DIAGNOSIS — H40052 Ocular hypertension, left eye: Secondary | ICD-10-CM | POA: Diagnosis not present

## 2023-06-21 DIAGNOSIS — H40013 Open angle with borderline findings, low risk, bilateral: Secondary | ICD-10-CM | POA: Diagnosis not present

## 2023-06-21 DIAGNOSIS — H04123 Dry eye syndrome of bilateral lacrimal glands: Secondary | ICD-10-CM | POA: Diagnosis not present

## 2023-06-21 DIAGNOSIS — H2000 Unspecified acute and subacute iridocyclitis: Secondary | ICD-10-CM | POA: Diagnosis not present

## 2023-06-21 DIAGNOSIS — H2513 Age-related nuclear cataract, bilateral: Secondary | ICD-10-CM | POA: Diagnosis not present

## 2023-08-03 DIAGNOSIS — Z79899 Other long term (current) drug therapy: Secondary | ICD-10-CM | POA: Diagnosis not present

## 2023-08-03 DIAGNOSIS — H3581 Retinal edema: Secondary | ICD-10-CM | POA: Diagnosis not present

## 2023-08-03 DIAGNOSIS — H2513 Age-related nuclear cataract, bilateral: Secondary | ICD-10-CM | POA: Diagnosis not present

## 2023-08-03 DIAGNOSIS — H30033 Focal chorioretinal inflammation, peripheral, bilateral: Secondary | ICD-10-CM | POA: Diagnosis not present

## 2023-08-13 DIAGNOSIS — H30033 Focal chorioretinal inflammation, peripheral, bilateral: Secondary | ICD-10-CM | POA: Diagnosis not present

## 2023-08-13 DIAGNOSIS — Z79899 Other long term (current) drug therapy: Secondary | ICD-10-CM | POA: Diagnosis not present

## 2023-08-21 DIAGNOSIS — K219 Gastro-esophageal reflux disease without esophagitis: Secondary | ICD-10-CM | POA: Diagnosis not present

## 2023-08-21 DIAGNOSIS — K589 Irritable bowel syndrome without diarrhea: Secondary | ICD-10-CM | POA: Diagnosis not present

## 2023-08-21 DIAGNOSIS — Z862 Personal history of diseases of the blood and blood-forming organs and certain disorders involving the immune mechanism: Secondary | ICD-10-CM | POA: Diagnosis not present

## 2023-08-29 ENCOUNTER — Other Ambulatory Visit: Payer: Self-pay | Admitting: Internal Medicine

## 2023-08-29 DIAGNOSIS — D508 Other iron deficiency anemias: Secondary | ICD-10-CM

## 2023-08-29 DIAGNOSIS — Z1231 Encounter for screening mammogram for malignant neoplasm of breast: Secondary | ICD-10-CM

## 2023-08-29 DIAGNOSIS — E785 Hyperlipidemia, unspecified: Secondary | ICD-10-CM

## 2023-08-30 ENCOUNTER — Ambulatory Visit: Attending: Internal Medicine | Admitting: Internal Medicine

## 2023-08-30 ENCOUNTER — Encounter: Payer: Self-pay | Admitting: Internal Medicine

## 2023-08-30 VITALS — BP 114/62 | HR 47 | Temp 98.5°F | Ht 67.0 in | Wt 177.0 lb

## 2023-08-30 DIAGNOSIS — Z87898 Personal history of other specified conditions: Secondary | ICD-10-CM

## 2023-08-30 DIAGNOSIS — J441 Chronic obstructive pulmonary disease with (acute) exacerbation: Secondary | ICD-10-CM

## 2023-08-30 DIAGNOSIS — Z8639 Personal history of other endocrine, nutritional and metabolic disease: Secondary | ICD-10-CM | POA: Diagnosis not present

## 2023-08-30 DIAGNOSIS — F17210 Nicotine dependence, cigarettes, uncomplicated: Secondary | ICD-10-CM | POA: Diagnosis not present

## 2023-08-30 DIAGNOSIS — D508 Other iron deficiency anemias: Secondary | ICD-10-CM

## 2023-08-30 DIAGNOSIS — F17200 Nicotine dependence, unspecified, uncomplicated: Secondary | ICD-10-CM

## 2023-08-30 DIAGNOSIS — E785 Hyperlipidemia, unspecified: Secondary | ICD-10-CM | POA: Diagnosis not present

## 2023-08-30 DIAGNOSIS — H3093 Unspecified chorioretinal inflammation, bilateral: Secondary | ICD-10-CM | POA: Diagnosis not present

## 2023-08-30 LAB — POCT GLYCOSYLATED HEMOGLOBIN (HGB A1C): HbA1c, POC (prediabetic range): 5.6 % — AB (ref 5.7–6.4)

## 2023-08-30 LAB — GLUCOSE, POCT (MANUAL RESULT ENTRY): POC Glucose: 137 mg/dL — AB (ref 70–99)

## 2023-08-30 MED ORDER — PREDNISONE 20 MG PO TABS: 20.0000 mg | ORAL_TABLET | Freq: Every day | ORAL | 0 refills | Status: DC

## 2023-08-30 MED ORDER — BENZONATATE 100 MG PO CAPS: 100.0000 mg | ORAL_CAPSULE | Freq: Three times a day (TID) | ORAL | 0 refills | Status: DC | PRN

## 2023-08-30 NOTE — Progress Notes (Signed)
 Patient ID: Alison Ford, female    DOB: 02-10-1955  MRN: [id number]  CC: Hypertension (HTN f/u./Cough, congestion X2 weeks - requesting rx sent to [facility])   Subjective: Arianny Pun is a 68 y.o. female who presents for chronic ds management. Her concerns today include:  Pt with hx of  HTN, preDM, HL, tob dep, COPD stage 1 (Dr. Neda), IDA, Vit D def and s/p RT rotator cuff repair and RT CTS release, LT OA knee, chronic constipation, IBS, history of colon polyps.   Discussed the use of AI scribe software for clinical note transcription with the patient, who gave verbal consent to proceed.  History of Present Illness Alison Ford is a 68 year old female with COPD who presents for a four-month follow-up visit.  COPD/Tob: She continues to use her Trelegy inhaler daily. Over the past two weeks, she has increased her nebulizer use to every other night due to heightened congestion and dyspnea, which she attributes to a cold. At baseline, she typically uses the nebulizer once a week or less. Her symptoms include nasal and chest congestion, a cough with white phlegm, and night sweats, but she denies fever. She has been using over-the-counter cold medicine, specifically chloracetamol, but discontinued it due to side effects and lack of efficacy. She has recently resumed smoking two to three cigarettes a day, while using nicotine  gum to help reduce cravings. She previously tried nicotine  patches but prefers the gum.  HTN: Reports compliance with taking amlodipine  5 mg daily and lisinopril -hydrochlorothiazide .   She has made dietary changes since discontinuing metformin  for hx prediabetes. She continues to take lovastatin  40 mg daily for cholesterol management.  She is under the care of a retina specialist for chorioretinal inflammation and cataracts and is still on Diamox 250 mg and Imuran 50 mg. She recently had blood work done related to her eye condition and is awaiting further  information.  History of anemia: She has history of iron  deficiency anemia.  Currently taking iron  supplement 3 times a week.  CBC done in July by her retina specialist showed hemoglobin of 12.4 which is stable compared to February of this year.    Patient Active Problem List   Diagnosis Date Noted   Left adrenal mass (HCC) 04/25/2022   Angiodysplasia of stomach and duodenum without bleeding 12/14/2021   COVID-19 12/28/2020   Class 1 obesity due to excess calories with serious comorbidity and body mass index (BMI) of 31.0 to 31.9 in adult 05/15/2019   Prediabetes 05/15/2019   Iron  deficiency anemia 05/15/2019   Systolic ejection murmur 05/15/2019   Hypoxemia requiring supplemental oxygen  05/15/2019   Normocytic anemia 04/29/2019   Chronic obstructive pulmonary disease (HCC) 05/30/2016   Rotator cuff tear 05/02/2016   Rotator cuff tendonitis, right 12/30/2015   Cervicalgia 12/30/2015   Eczema, dyshidrotic 11/18/2014   Acne 11/18/2014   Smoker 06/01/2014   Right knee DJD    Hyperlipidemia    HTN (hypertension) 06/05/2012     Current Outpatient Medications on File Prior to Visit  Medication Sig Dispense Refill   albuterol  (PROVENTIL ) (2.5 MG/3ML) 0.083% nebulizer solution Take 3 mLs (2.5 mg total) by nebulization every 6 (six) hours as needed for wheezing or shortness of breath. 150 mL 2   albuterol  (VENTOLIN  HFA) 108 (90 Base) MCG/ACT inhaler Inhale 2 puffs into the lungs every 6 (six) hours as needed for wheezing or shortness of breath. 18 g 6   amLODipine  (NORVASC ) 5 MG tablet Take 1 tablet (  5 mg total) by mouth daily. 90 tablet 1   aspirin  EC 81 MG tablet Take 1 tablet (81 mg total) by mouth daily. 90 tablet 3   azaTHIOprine (IMURAN) 50 MG tablet Take 100 mg by mouth daily.     Blood Glucose Monitoring Suppl (TRUE METRIX METER) w/Device KIT Use as directed 1 kit 0   brimonidine (ALPHAGAN) 0.2 % ophthalmic solution Place into both eyes.     cetirizine  (ZYRTEC ) 10 MG tablet Take  1 tablet (10 mg total) by mouth daily. 30 tablet 6   clotrimazole -betamethasone  (LOTRISONE ) cream APPLY CREAM TOPICALLY TO AFFECTED AREA TWICE DAILY 30 g 0   cromolyn (OPTICROM) 4 % ophthalmic solution Place 1 drop into both eyes 4 (four) times daily.  98   FEROSUL 325 (65 Fe) MG tablet TAKE 1 TABLET THREE TIMES WEEKLY 36 tablet 3   Fluticasone -Umeclidin-Vilant (TRELEGY ELLIPTA ) 100-62.5-25 MCG/ACT AEPB Inhale 1 puff into the lungs daily. 1 each 11   Fluticasone -Umeclidin-Vilant (TRELEGY ELLIPTA ) 200-62.5-25 MCG/ACT AEPB Inhale 1 puff into the lungs daily. 60 each 5   glucose blood (TRUE METRIX BLOOD GLUCOSE TEST) test strip Use to check blood sugar once daily. 100 each 4   latanoprost (XALATAN) 0.005 % ophthalmic solution Place 1 drop into both eyes.     lisinopril -hydrochlorothiazide  (ZESTORETIC ) 20-25 MG tablet Take 1 tablet by mouth daily. 90 tablet 3   lovastatin  (MEVACOR ) 40 MG tablet TAKE 1 TABLET AT BEDTIME 90 tablet 3   nicotine  polacrilex (NICORETTE ) 2 MG gum Take 1 each (2 mg total) by mouth as needed for smoking cessation. 100 tablet 3   pantoprazole  (PROTONIX ) 40 MG tablet TAKE 1 TABLET EVERY DAY 90 tablet 3   TRUEplus Lancets 28G MISC Use as directed 100 each 4   acetaZOLAMIDE (DIAMOX) 250 MG tablet Take 500 mg by mouth 2 (two) times daily.     No current facility-administered medications on file prior to visit.    Allergies  Allergen Reactions   Penicillins Itching and Other (See Comments)    Burning and whelps Has patient had a PCN reaction causing immediate rash, facial/tongue/throat swelling, SOB or lightheadedness with hypotension: NO Has patient had a PCN reaction causing severe rash involving mucus membranes or skin necrosis: NO Has patient had a PCN reaction that required hospitalization: NO Has patient had a PCN reaction occurring within the last 10 years: NO If all of the above answers are NO, then may proceed with Cephalosporin use.    Social History    Socioeconomic History   Marital status: Legally Separated    Spouse name: Not on file   Number of children: 3   Years of education: Not on file   Highest education level: Not on file  Occupational History    Employer: [id number]  Tobacco Use   Smoking status: Every Day    Current packs/day: 0.00    Types: Cigarettes    Last attempt to quit: 08/13/2020    Years since quitting: 3.0   Smokeless tobacco: Never   Tobacco comments:    Started smoking  Substance and Sexual Activity   Alcohol use: Yes    Alcohol/week: 0.0 standard drinks of alcohol    Comment: ocassionally   Drug use: No   Sexual activity: Never  Other Topics Concern   Not on file  Social History Narrative   Lives by herself now.    Social Drivers of Corporate investment banker Strain: Low Risk  (10/06/2022)   Overall Physicist, medical Strain (  CARDIA)    Difficulty of Paying Living Expenses: Not hard at all  Food Insecurity: No Food Insecurity (12/28/2022)   Hunger Vital Sign    Worried About Running Out of Food in the Last Year: Never true    Ran Out of Food in the Last Year: Never true  Transportation Needs: No Transportation Needs (12/28/2022)   PRAPARE - Administrator, Civil Service (Medical): No    Lack of Transportation (Non-Medical): No  Physical Activity: Sufficiently Active (12/28/2022)   Exercise Vital Sign    Days of Exercise per Week: 6 days    Minutes of Exercise per Session: 50 min  Recent Concern: Physical Activity - Insufficiently Active (10/06/2022)   Exercise Vital Sign    Days of Exercise per Week: 7 days    Minutes of Exercise per Session: 20 min  Stress: No Stress Concern Present (12/28/2022)   [employer] of Occupational Health - Occupational Stress Questionnaire    Feeling of Stress : Not at all  Social Connections: Socially Isolated (12/28/2022)   Social Connection and Isolation Panel    Frequency of Communication with Friends and Family: Three times a week     Frequency of Social Gatherings with Friends and Family: Twice a week    Attends Religious Services: Never    Database administrator or Organizations: No    Attends Banker Meetings: Never    Marital Status: Separated  Intimate Partner Violence: Not At Risk (12/28/2022)   Humiliation, Afraid, Rape, and Kick questionnaire    Fear of Current or Ex-Partner: No    Emotionally Abused: No    Physically Abused: No    Sexually Abused: No    Family History  Problem Relation Age of Onset   CAD Father 7   CAD Mother 74   Hypertension Mother     Past Surgical History:  Procedure Laterality Date   ABDOMINAL HYSTERECTOMY     Athroscopic knee surgery     Bilateral   BREAST BIOPSY Right 03/2017   CARPAL TUNNEL RELEASE Right 05/02/2016   Procedure: CARPAL TUNNEL RELEASE;  Surgeon: Glendia Cordella Hutchinson, MD;  Location: [facility] OR;  Service: Orthopedics;  Laterality: Right;   CHOLECYSTECTOMY     DILATION AND CURETTAGE OF UTERUS     EYE SURGERY Right    SHOULDER ARTHROSCOPY WITH SUBACROMIAL DECOMPRESSION Right 05/02/2016   Procedure: RIGHT SHOULDER ARTHROSCOPY WITH SUBACROMIAL DECOMPRESSION, MINI OPEN ROTATOR CUFF TEAR REPAIR, AND RIGHT CARPAL TUNNEL RELEASE;  Surgeon: Glendia Cordella Hutchinson, MD;  Location: [facility] OR;  Service: Orthopedics;  Laterality: Right;   TOTAL KNEE ARTHROPLASTY Right 07/15/2012   Procedure: RIGHT TOTAL KNEE ARTHROPLASTY;  Surgeon: Lamar DELENA Millman, MD;  Location: [facility] OR;  Service: Orthopedics;  Laterality: Right;    ROS: Review of Systems Negative except as stated above  PHYSICAL EXAM: BP 114/62 (BP Location: Left Arm, Patient Position: Sitting, Cuff Size: Normal)   Pulse (!) 47   Temp 98.5 F (36.9 C) (Oral)   Ht 5' 7 (1.702 m)   Wt 177 lb (80.3 kg)   SpO2 96%   BMI 27.72 kg/m   Physical Exam  General appearance - alert, well appearing, and in no distress Mental status - normal mood, behavior, speech, dress, motor activity, and thought processes Neck - supple,  no significant adenopathy Chest -mild expiratory wheezes with some rhonchi. Heart - normal rate, regular rhythm, normal S1, S2, no murmurs, rubs, clicks or gallops Extremities - peripheral pulses normal, no pedal  edema, no clubbing or cyanosis  Results for orders placed or performed in visit on 08/30/23  POCT glucose (manual entry)   Collection Time: 08/30/23  3:26 PM  Result Value Ref Range   POC Glucose 137 (A) 70 - 99 mg/dl  POCT glycosylated hemoglobin (Hb A1C)   Collection Time: 08/30/23  3:27 PM  Result Value Ref Range   Hemoglobin A1C     HbA1c POC (<> result, manual entry)     HbA1c, POC (prediabetic range) 5.6 (A) 5.7 - 6.4 %   HbA1c, POC (controlled diabetic range)         Latest Ref Rng & Units 04/25/2022   12:00 PM 06/24/2021    4:51 PM 11/26/2019   10:51 AM  CMP  Glucose 70 - 99 mg/dL 885  876  99   BUN 8 - 27 mg/dL 13  17  13    Creatinine 0.57 - 1.00 mg/dL 9.18  9.04  9.13   Sodium 134 - 144 mmol/L 141  141  140   Potassium 3.5 - 5.2 mmol/L 4.2  4.3  4.6   Chloride 96 - 106 mmol/L 104  104  104   CO2 20 - 29 mmol/L 23  21  23    Calcium 8.7 - 10.3 mg/dL 89.9  89.7  89.7   Total Protein 6.0 - 8.5 g/dL 6.7  6.9  6.8   Total Bilirubin 0.0 - 1.2 mg/dL <9.7  <9.7  0.2   Alkaline Phos 44 - 121 IU/L 88  82  79   AST 0 - 40 IU/L 15  16  19    ALT 0 - 32 IU/L 17  13  18     Lipid Panel     Component Value Date/Time   CHOL 193 04/25/2022 1200   TRIG 140 04/25/2022 1200   HDL 60 04/25/2022 1200   CHOLHDL 3.2 04/25/2022 1200   CHOLHDL 4.0 03/17/2014 0922   VLDL 41 (H) 03/17/2014 0922   LDLCALC 109 (H) 04/25/2022 1200    CBC    Component Value Date/Time   WBC 5.6 04/25/2022 1200   WBC 7.0 05/07/2017 1422   RBC 4.34 04/25/2022 1200   RBC 4.03 05/07/2017 1422   HGB 13.5 04/25/2022 1200   HCT 40.8 04/25/2022 1200   PLT 296 04/25/2022 1200   MCV 94 04/25/2022 1200   MCH 31.1 04/25/2022 1200   MCH 30.5 05/07/2017 1422   MCHC 33.1 04/25/2022 1200   MCHC 34.4  05/07/2017 1422   RDW 11.0 (L) 04/25/2022 1200   LYMPHSABS 3,171 05/07/2017 1422   MONOABS 448 09/29/2015 1158   EOSABS 273 05/07/2017 1422   BASOSABS 42 05/07/2017 1422    ASSESSMENT AND PLAN: 1. COPD exacerbation (HCC) (Primary) Patient with mild COPD exacerbation due to likely viral respiratory illness. Will put her on a course of prednisone  and give some Tessalon  Perles.  Continue Trelegy inhaler and nebulizer treatments as needed. - predniSONE  (DELTASONE ) 20 MG tablet; Take 1 tablet (20 mg total) by mouth daily with breakfast.  Dispense: 5 tablet; Refill: 0 - benzonatate  (TESSALON  PERLES) 100 MG capsule; Take 1 capsule (100 mg total) by mouth 3 (three) times daily as needed.  Dispense: 20 capsule; Refill: 0  2. Tobacco dependence Strongly advised to quit.  3. History of prediabetes No longer in range for prediabetes which is good.  Encourage healthy eating habits - POCT glycosylated hemoglobin (Hb A1C) - POCT glucose (manual entry)  4. Chorioretinal inflammation of both eyes Under the care of  ophthalmology.  5. Other iron  deficiency anemia Continue iron  supplement 3 times a week  6. Hyperlipidemia, unspecified hyperlipidemia type Continue lovastatin  40 mg daily   Patient was given the opportunity to ask questions.  Patient verbalized understanding of the plan and was able to repeat key elements of the plan.   This documentation was completed using Paediatric nurse.  Any transcriptional errors are unintentional.  Orders Placed This Encounter  Procedures   POCT glycosylated hemoglobin (Hb A1C)   POCT glucose (manual entry)     Requested Prescriptions    No prescriptions requested or ordered in this encounter    No follow-ups on file.  Barnie Louder, MD, FACP

## 2023-08-30 NOTE — Patient Instructions (Signed)
 VISIT SUMMARY:  During your visit today, we discussed your COPD, recent respiratory symptoms, smoking habits, blood pressure, cholesterol management, and eye condition. We reviewed your current medications and made some adjustments to help manage your symptoms more effectively.  YOUR PLAN:  -COPD WITH ACUTE EXACERBATION: You are experiencing a mild flare-up of your COPD, likely due to a respiratory infection. To help manage this, I have prescribed prednisone  for 5 days and Tessalon  Perles to help with your cough as needed.  -TOBACCO USE DISORDER: You continue to smoke 2-3 cigarettes daily despite efforts to quit. I encourage you to set a quit date and continue using nicotine  gum to help manage cravings.  -HYPERTENSION: Your blood pressure is well-controlled with your current medications, amlodipine  5 mg daily and lisinopril -hydrochlorothiazide .  -HYPERLIPIDEMIA: Your cholesterol levels are being managed with lovastatin  40 mg daily.  -UVEITIS ON IMMUNOSUPPRESSIVE THERAPY: You are under the care of a retina specialist for eye inflammation and are currently taking Diamox 250 mg and Imuran 50 mg. Recent blood work was completed by your specialist.  INSTRUCTIONS:  Please follow the prescribed medication regimen for your COPD exacerbation and use Tessalon  Perles as needed for your cough. Set a quit date for smoking and continue using nicotine  gum to help with cravings. Continue taking your blood pressure and cholesterol medications as prescribed. Follow up with your retina specialist regarding your recent blood work. Schedule a follow-up appointment with me in four months or sooner if your symptoms worsen.

## 2023-09-12 ENCOUNTER — Ambulatory Visit
Admission: RE | Admit: 2023-09-12 | Discharge: 2023-09-12 | Disposition: A | Discharge status: Home / Self Care | Source: Ambulatory Visit | Attending: Internal Medicine | Admitting: Internal Medicine

## 2023-09-12 DIAGNOSIS — Z1231 Encounter for screening mammogram for malignant neoplasm of breast: Secondary | ICD-10-CM | POA: Diagnosis not present

## 2023-09-19 ENCOUNTER — Ambulatory Visit: Payer: Self-pay | Admitting: Internal Medicine

## 2023-09-24 ENCOUNTER — Other Ambulatory Visit: Payer: Self-pay | Admitting: Internal Medicine

## 2023-09-24 DIAGNOSIS — I10 Essential (primary) hypertension: Secondary | ICD-10-CM

## 2023-10-02 ENCOUNTER — Other Ambulatory Visit: Payer: Self-pay | Admitting: Internal Medicine

## 2023-10-02 DIAGNOSIS — J449 Chronic obstructive pulmonary disease, unspecified: Secondary | ICD-10-CM

## 2023-10-23 DIAGNOSIS — K219 Gastro-esophageal reflux disease without esophagitis: Secondary | ICD-10-CM | POA: Diagnosis not present

## 2023-10-23 DIAGNOSIS — Z862 Personal history of diseases of the blood and blood-forming organs and certain disorders involving the immune mechanism: Secondary | ICD-10-CM | POA: Diagnosis not present

## 2023-10-23 DIAGNOSIS — K589 Irritable bowel syndrome without diarrhea: Secondary | ICD-10-CM | POA: Diagnosis not present

## 2023-10-23 DIAGNOSIS — R140 Abdominal distension (gaseous): Secondary | ICD-10-CM | POA: Diagnosis not present

## 2023-11-06 ENCOUNTER — Ambulatory Visit: Admitting: Pulmonary Disease

## 2023-11-13 DIAGNOSIS — H30033 Focal chorioretinal inflammation, peripheral, bilateral: Secondary | ICD-10-CM | POA: Diagnosis not present

## 2023-11-13 DIAGNOSIS — Z79899 Other long term (current) drug therapy: Secondary | ICD-10-CM | POA: Diagnosis not present

## 2023-11-13 DIAGNOSIS — H2513 Age-related nuclear cataract, bilateral: Secondary | ICD-10-CM | POA: Diagnosis not present

## 2023-11-13 DIAGNOSIS — H3581 Retinal edema: Secondary | ICD-10-CM | POA: Diagnosis not present

## 2023-11-26 DIAGNOSIS — H2512 Age-related nuclear cataract, left eye: Secondary | ICD-10-CM | POA: Diagnosis not present

## 2023-11-26 DIAGNOSIS — H10413 Chronic giant papillary conjunctivitis, bilateral: Secondary | ICD-10-CM | POA: Diagnosis not present

## 2023-11-26 DIAGNOSIS — H40052 Ocular hypertension, left eye: Secondary | ICD-10-CM | POA: Diagnosis not present

## 2023-11-26 DIAGNOSIS — H40013 Open angle with borderline findings, low risk, bilateral: Secondary | ICD-10-CM | POA: Diagnosis not present

## 2023-11-26 DIAGNOSIS — H2000 Unspecified acute and subacute iridocyclitis: Secondary | ICD-10-CM | POA: Diagnosis not present

## 2023-11-26 DIAGNOSIS — H04123 Dry eye syndrome of bilateral lacrimal glands: Secondary | ICD-10-CM | POA: Diagnosis not present

## 2023-12-25 ENCOUNTER — Ambulatory Visit: Attending: Internal Medicine

## 2024-01-03 ENCOUNTER — Encounter: Payer: Self-pay | Admitting: Internal Medicine

## 2024-01-03 ENCOUNTER — Ambulatory Visit: Attending: Internal Medicine | Admitting: Internal Medicine

## 2024-01-03 VITALS — BP 125/70 | HR 70 | Ht 67.0 in | Wt 179.4 lb

## 2024-01-03 DIAGNOSIS — R21 Rash and other nonspecific skin eruption: Secondary | ICD-10-CM

## 2024-01-03 DIAGNOSIS — I10 Essential (primary) hypertension: Secondary | ICD-10-CM

## 2024-01-03 DIAGNOSIS — J449 Chronic obstructive pulmonary disease, unspecified: Secondary | ICD-10-CM | POA: Diagnosis not present

## 2024-01-03 DIAGNOSIS — E785 Hyperlipidemia, unspecified: Secondary | ICD-10-CM

## 2024-01-03 DIAGNOSIS — Z23 Encounter for immunization: Secondary | ICD-10-CM

## 2024-01-03 DIAGNOSIS — F17200 Nicotine dependence, unspecified, uncomplicated: Secondary | ICD-10-CM

## 2024-01-03 DIAGNOSIS — D508 Other iron deficiency anemias: Secondary | ICD-10-CM | POA: Diagnosis not present

## 2024-01-03 MED ORDER — BENZONATATE 100 MG PO CAPS: 100.0000 mg | ORAL_CAPSULE | Freq: Three times a day (TID) | ORAL | 0 refills | Status: DC | PRN

## 2024-01-03 MED ORDER — NICOTINE POLACRILEX 2 MG MT GUM: 2.0000 mg | CHEWING_GUM | OROMUCOSAL | 3 refills | Status: AC | PRN

## 2024-01-03 MED ORDER — CLOTRIMAZOLE-BETAMETHASONE 1-0.05 % EX CREA: TOPICAL_CREAM | CUTANEOUS | 0 refills | Status: AC

## 2024-01-03 NOTE — Progress Notes (Signed)
 Patient ID: Alison Ford, female    DOB: [birth year]  MRN: [id number]  CC: Medical Management of Chronic Issues (Hypertension follow up  /Congestion X 1 week. Tried over the counter medication dose not help  /Requesting A1C check /Flu vax administered on 01/03/24 - C.A.)   Subjective: Alison Ford is a 68 y.o. female who presents for chronic ds management. Her concerns today include:  Pt with hx of  HTN, preDM, HL, tob dep, COPD stage 1 (Dr. Neda), IDA, Vit D def and s/p RT rotator cuff repair and RT CTS release, LT OA knee, chronic constipation, IBS, history of colon polyps. 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   Discussed the use of AI scribe software for clinical note transcription with the patient, who gave verbal consent to proceed.  History of Present Illness   Alison Ford is a 68 year old female with hypertension, COPD, and hyperlipidemia who presents for a follow-up visit.  HTN: She is managing her hypertension with amlodipine  5 mg daily and lisinopril -hydrochlorothiazide  20/25 mg daily. Her blood pressure readings have been stable, and she monitors it regularly. She is reducing her salt intake.  For her COPD, she uses Trelegy inhaler three to four times a week and albuterol  two to three times a week, primarily during physical activity when she experiences shortness of breath. She used her nebulizer over the weekend due to nasal dryness and congestion, which helped her cough up phlegm. She is attempting to quit smoking and has set a resolution to stop by January 1st. She uses regular gum and wants nicotine  gum to aid in quitting. Request rxn for Tessalon  Perles  HL: she takes lovastatin  40 mg daily at night and tolerates it.  She has IDA and was taking iron  supplements three times a week but ran out four days ago. Her last hemoglobin level was 11 at the end of October, down from 12  in July.  Previous hx of PreDM: Her last A1c in August showed she was no longer in the prediabetes range, and her blood sugar was 99 in October.  She requests a refill for clotrimazole  betamethasone  cream for her hand, gets recurrent rash during winter mths on palms.      Patient Active Problem List   Diagnosis Date Noted   Left adrenal mass 04/25/2022   Angiodysplasia of stomach and duodenum without bleeding 12/14/2021   COVID-19 12/28/2020   Class 1 obesity due to excess calories with serious comorbidity and body mass index (BMI) of 31.0 to 31.9 in adult 05/15/2019   Prediabetes 05/15/2019   Iron  deficiency anemia 05/15/2019   Systolic ejection murmur 05/15/2019   Hypoxemia requiring supplemental oxygen  05/15/2019   Normocytic anemia 04/29/2019   Chronic obstructive pulmonary disease (HCC) 05/30/2016   Rotator cuff tear 05/02/2016   Rotator cuff tendonitis, right 12/30/2015   Cervicalgia 12/30/2015   Eczema, dyshidrotic 11/18/2014   Acne 11/18/2014   Smoker 06/01/2014   Right knee DJD    Hyperlipidemia    HTN (hypertension) 06/05/2012     Medications Ordered Prior to Encounter[1]  Allergies[2]  Social History   Socioeconomic History   Marital status: Legally Separated    Spouse name: Not on file   Number of children: 3   Years of education: Not on file   Highest education level: Not on file  Occupational History    Employer: [id number]  Tobacco Use  Smoking status: Every Day    Current packs/day: 0.00    Average packs/day: 0.3 packs/day    Types: Cigarettes    Last attempt to quit: 08/13/2020    Years since quitting: 3.3   Smokeless tobacco: Never   Tobacco comments:    Started smoking  Substance and Sexual Activity   Alcohol use: Yes    Alcohol/week: 0.0 standard drinks of alcohol    Comment: ocassionally   Drug use: No   Sexual activity: Never  Other Topics Concern   Not on file  Social History Narrative   Lives by herself now.    Social Drivers of  Health   Tobacco Use: High Risk (01/03/2024)   Patient History    Smoking Tobacco Use: Every Day    Smokeless Tobacco Use: Never    Passive Exposure: Not on file  Financial Resource Strain: Low Risk (10/06/2022)   Overall Financial Resource Strain (CARDIA)    Difficulty of Paying Living Expenses: Not hard at all  Food Insecurity: No Food Insecurity (12/28/2022)   Hunger Vital Sign    Worried About Running Out of Food in the Last Year: Never true    Ran Out of Food in the Last Year: Never true  Transportation Needs: No Transportation Needs (12/28/2022)   PRAPARE - Administrator, Civil Service (Medical): No    Lack of Transportation (Non-Medical): No  Physical Activity: Sufficiently Active (12/28/2022)   Exercise Vital Sign    Days of Exercise per Week: 6 days    Minutes of Exercise per Session: 50 min  Recent Concern: Physical Activity - Insufficiently Active (10/06/2022)   Exercise Vital Sign    Days of Exercise per Week: 7 days    Minutes of Exercise per Session: 20 min  Stress: No Stress Concern Present (12/28/2022)   [employer] of Occupational Health - Occupational Stress Questionnaire    Feeling of Stress : Not at all  Social Connections: Socially Isolated (12/28/2022)   Social Connection and Isolation Panel    Frequency of Communication with Friends and Family: Three times a week    Frequency of Social Gatherings with Friends and Family: Twice a week    Attends Religious Services: Never    Database Administrator or Organizations: No    Attends Banker Meetings: Never    Marital Status: Separated  Intimate Partner Violence: Not At Risk (12/28/2022)   Humiliation, Afraid, Rape, and Kick questionnaire    Fear of Current or Ex-Partner: No    Emotionally Abused: No    Physically Abused: No    Sexually Abused: No  Depression (PHQ2-9): Medium Risk (08/30/2023)   Depression (PHQ2-9)    PHQ-2 Score: 5  Alcohol Screen: Low Risk (12/28/2022)    Alcohol Screen    Last Alcohol Screening Score (AUDIT): 1  Housing: Low Risk (12/28/2022)   Housing Stability Vital Sign    Unable to Pay for Housing in the Last Year: No    Number of Times Moved in the Last Year: 0    Homeless in the Last Year: No  Utilities: Not At Risk (12/28/2022)   AHC Utilities    Threatened with loss of utilities: No  Health Literacy: Adequate Health Literacy (12/28/2022)   B1300 Health Literacy    Frequency of need for help with medical instructions: Never    Family History  Problem Relation Age of Onset   CAD Father 71   CAD Mother 3   Hypertension Mother  Past Surgical History:  Procedure Laterality Date   ABDOMINAL HYSTERECTOMY     Athroscopic knee surgery     Bilateral   BREAST BIOPSY Right 03/2017   CARPAL TUNNEL RELEASE Right 05/02/2016   Procedure: CARPAL TUNNEL RELEASE;  Surgeon: Glendia Cordella Hutchinson, MD;  Location: [facility] OR;  Service: Orthopedics;  Laterality: Right;   CHOLECYSTECTOMY     DILATION AND CURETTAGE OF UTERUS     EYE SURGERY Right    SHOULDER ARTHROSCOPY WITH SUBACROMIAL DECOMPRESSION Right 05/02/2016   Procedure: RIGHT SHOULDER ARTHROSCOPY WITH SUBACROMIAL DECOMPRESSION, MINI OPEN ROTATOR CUFF TEAR REPAIR, AND RIGHT CARPAL TUNNEL RELEASE;  Surgeon: Glendia Cordella Hutchinson, MD;  Location: [facility] OR;  Service: Orthopedics;  Laterality: Right;   TOTAL KNEE ARTHROPLASTY Right 07/15/2012   Procedure: RIGHT TOTAL KNEE ARTHROPLASTY;  Surgeon: Lamar DELENA Millman, MD;  Location: [facility] OR;  Service: Orthopedics;  Laterality: Right;    ROS: Review of Systems Negative except as stated above  PHYSICAL EXAM: BP 125/70   Pulse 70   Ht 5' 7 (1.702 m)   Wt 179 lb 6.4 oz (81.4 kg)   SpO2 97%   BMI 28.10 kg/m   Physical Exam  General appearance - alert, well appearing, older AAF and in no distress Mental status - normal mood, behavior, speech, dress, motor activity, and thought processes Neck - supple, no significant adenopathy Chest - clear to  auscultation, no wheezes, rales or rhonchi, symmetric air entry Heart - normal rate, regular rhythm, normal S1, S2, no murmurs, rubs, clicks or gallops Extremities - peripheral pulses normal, no pedal edema, no clubbing or cyanosis Skin: patchy, dry thicken areas on palms of hands more on RT     Latest Ref Rng & Units 04/25/2022   12:00 PM 06/24/2021    4:51 PM 11/26/2019   10:51 AM  CMP  Glucose 70 - 99 mg/dL 885  876  99   BUN 8 - 27 mg/dL 13  17  13    Creatinine 0.57 - 1.00 mg/dL 9.18  9.04  9.13   Sodium 134 - 144 mmol/L 141  141  140   Potassium 3.5 - 5.2 mmol/L 4.2  4.3  4.6   Chloride 96 - 106 mmol/L 104  104  104   CO2 20 - 29 mmol/L 23  21  23    Calcium 8.7 - 10.3 mg/dL 89.9  89.7  89.7   Total Protein 6.0 - 8.5 g/dL 6.7  6.9  6.8   Total Bilirubin 0.0 - 1.2 mg/dL <9.7  <9.7  0.2   Alkaline Phos 44 - 121 IU/L 88  82  79   AST 0 - 40 IU/L 15  16  19    ALT 0 - 32 IU/L 17  13  18     Lipid Panel     Component Value Date/Time   CHOL 193 04/25/2022 1200   TRIG 140 04/25/2022 1200   HDL 60 04/25/2022 1200   CHOLHDL 3.2 04/25/2022 1200   CHOLHDL 4.0 03/17/2014 0922   VLDL 41 (H) 03/17/2014 0922   LDLCALC 109 (H) 04/25/2022 1200    CBC    Component Value Date/Time   WBC 5.6 04/25/2022 1200   WBC 7.0 05/07/2017 1422   RBC 4.34 04/25/2022 1200   RBC 4.03 05/07/2017 1422   HGB 13.5 04/25/2022 1200   HCT 40.8 04/25/2022 1200   PLT 296 04/25/2022 1200   MCV 94 04/25/2022 1200   MCH 31.1 04/25/2022 1200   MCH 30.5 05/07/2017 1422  MCHC 33.1 04/25/2022 1200   MCHC 34.4 05/07/2017 1422   RDW 11.0 (L) 04/25/2022 1200   LYMPHSABS 3,171 05/07/2017 1422   MONOABS 448 09/29/2015 1158   EOSABS 273 05/07/2017 1422   BASOSABS 42 05/07/2017 1422    ASSESSMENT AND PLAN: 1. Chronic obstructive pulmonary disease, unspecified COPD type (HCC) Stable with slight flare over wkend  Advise daily use of trelegy inhaler instead of just 3-4x/wk Encouraged her in her endeavors to quit  smoking.  Prescription sent to the pharmacy for nicotine  gum.  Advise of 1 800 quit NOW in the event that her insurance does not pay for the nicotine  gum. - benzonatate  (TESSALON  PERLES) 100 MG capsule; Take 1 capsule (100 mg total) by mouth 3 (three) times daily as needed.  Dispense: 20 capsule; Refill: 0  2. Essential hypertension (Primary) Controlled.  Continue lisinopril /HCTZ and amlodipine  5 mg daily.  3. Tobacco dependence See #1 above - nicotine  polacrilex (NICORETTE ) 2 MG gum; Take 1 each (2 mg total) by mouth as needed for smoking cessation.  Dispense: 100 tablet; Refill: 3  4. Hyperlipidemia, unspecified hyperlipidemia type Continue lovastatin  40 mg daily.  5. Other iron  deficiency anemia She plans to purchase iron  supplement and continue to take 3 times a week.  6. Rash of both hands - clotrimazole -betamethasone  (LOTRISONE ) cream; APPLY CREAM TOPICALLY TO AFFECTED AREA TWICE DAILY  Dispense: 30 g; Refill: 0  7. Need for influenza vaccination given     Patient was given the opportunity to ask questions.  Patient verbalized understanding of the plan and was able to repeat key elements of the plan.   This documentation was completed using Paediatric nurse.  Any transcriptional errors are unintentional.  Orders Placed This Encounter  Procedures   Flu vaccine HIGH DOSE PF(Fluzone Trivalent)     Requested Prescriptions   Signed Prescriptions Disp Refills   benzonatate  (TESSALON  PERLES) 100 MG capsule 20 capsule 0    Sig: Take 1 capsule (100 mg total) by mouth 3 (three) times daily as needed.   clotrimazole -betamethasone  (LOTRISONE ) cream 30 g 0    Sig: APPLY CREAM TOPICALLY TO AFFECTED AREA TWICE DAILY   nicotine  polacrilex (NICORETTE ) 2 MG gum 100 tablet 3    Sig: Take 1 each (2 mg total) by mouth as needed for smoking cessation.    Return in about 4 months (around 05/03/2024) for Medicare Wellness Visit in 2-4 wks with CMA/LPN.  Barnie Louder, MD, FACP     [1]  Current Outpatient Medications on File Prior to Visit  Medication Sig Dispense Refill   acetaZOLAMIDE (DIAMOX) 250 MG tablet Take 500 mg by mouth 2 (two) times daily.     albuterol  (PROVENTIL ) (2.5 MG/3ML) 0.083% nebulizer solution Take 3 mLs (2.5 mg total) by nebulization every 6 (six) hours as needed for wheezing or shortness of breath. 150 mL 2   albuterol  (VENTOLIN  HFA) 108 (90 Base) MCG/ACT inhaler Inhale 2 puffs into the lungs every 6 (six) hours as needed for wheezing or shortness of breath. 18 g 6   amLODipine  (NORVASC ) 5 MG tablet TAKE 1 TABLET EVERY DAY (DOSE DECREASE) 90 tablet 1   aspirin  EC 81 MG tablet Take 1 tablet (81 mg total) by mouth daily. 90 tablet 3   azaTHIOprine (IMURAN) 50 MG tablet Take 100 mg by mouth daily.     Blood Glucose Monitoring Suppl (TRUE METRIX METER) w/Device KIT Use as directed 1 kit 0   brimonidine (ALPHAGAN) 0.2 % ophthalmic solution Place into  both eyes.     cetirizine  (ZYRTEC ) 10 MG tablet Take 1 tablet (10 mg total) by mouth daily. 30 tablet 6   cromolyn (OPTICROM) 4 % ophthalmic solution Place 1 drop into both eyes 4 (four) times daily.  98   FEROSUL 325 (65 Fe) MG tablet TAKE 1 TABLET THREE TIMES WEEKLY 36 tablet 3   latanoprost (XALATAN) 0.005 % ophthalmic solution Place 1 drop into both eyes.     lisinopril -hydrochlorothiazide  (ZESTORETIC ) 20-25 MG tablet Take 1 tablet by mouth daily. 90 tablet 3   lovastatin  (MEVACOR ) 40 MG tablet TAKE 1 TABLET AT BEDTIME 90 tablet 3   pantoprazole  (PROTONIX ) 40 MG tablet TAKE 1 TABLET EVERY DAY 90 tablet 3   predniSONE  (DELTASONE ) 20 MG tablet Take 1 tablet (20 mg total) by mouth daily with breakfast. 5 tablet 0   TRELEGY ELLIPTA  200-62.5-25 MCG/ACT AEPB INHALE 1 PUFF INTO THE LUNGS DAILY 180 each 3   TRUEplus Lancets 28G MISC Use as directed 100 each 4   glucose blood (TRUE METRIX BLOOD GLUCOSE TEST) test strip Use to check blood sugar once daily. (Patient not taking: Reported on  01/03/2024) 100 each 4   No current facility-administered medications on file prior to visit.  [2]  Allergies Allergen Reactions   Penicillins Itching and Other (See Comments)    Burning and whelps Has patient had a PCN reaction causing immediate rash, facial/tongue/throat swelling, SOB or lightheadedness with hypotension: NO Has patient had a PCN reaction causing severe rash involving mucus membranes or skin necrosis: NO Has patient had a PCN reaction that required hospitalization: NO Has patient had a PCN reaction occurring within the last 10 years: NO If all of the above answers are NO, then may proceed with Cephalosporin use.

## 2024-01-03 NOTE — Patient Instructions (Signed)
 Use your Trelegy inhaler every day as prescribed. Prescription sent tof nicotine  gum. You can call [phone] to get the gum for free.

## 2024-01-24 ENCOUNTER — Encounter: Payer: Self-pay | Admitting: Pulmonary Disease

## 2024-01-24 ENCOUNTER — Ambulatory Visit (INDEPENDENT_AMBULATORY_CARE_PROVIDER_SITE_OTHER): Admitting: Pulmonary Disease

## 2024-01-24 VITALS — BP 115/79 | HR 110 | Temp 98.6°F | Ht 67.5 in | Wt 181.0 lb

## 2024-01-24 DIAGNOSIS — F17210 Nicotine dependence, cigarettes, uncomplicated: Secondary | ICD-10-CM | POA: Diagnosis not present

## 2024-01-24 DIAGNOSIS — J069 Acute upper respiratory infection, unspecified: Secondary | ICD-10-CM | POA: Diagnosis not present

## 2024-01-24 DIAGNOSIS — J449 Chronic obstructive pulmonary disease, unspecified: Secondary | ICD-10-CM | POA: Diagnosis not present

## 2024-01-24 DIAGNOSIS — J42 Unspecified chronic bronchitis: Secondary | ICD-10-CM

## 2024-01-24 MED ORDER — AZITHROMYCIN 250 MG PO TABS: ORAL_TABLET | ORAL | 0 refills | Status: AC

## 2024-01-24 MED ORDER — PREDNISONE 20 MG PO TABS: 20.0000 mg | ORAL_TABLET | Freq: Every day | ORAL | 0 refills | Status: AC

## 2024-01-24 NOTE — Progress Notes (Signed)
 "              Alison Ford    [id number]    10-15-1955  Primary Care Physician:Johnson, Barnie NOVAK, MD  Referring Physician: Vicci Barnie NOVAK, MD [address] [place],  [place] [address]  Chief complaint:   In for follow-up for COPD Having symptoms of an exacerbation  Discussed the use of AI scribe software for clinical note transcription with the patient, who gave verbal consent to proceed.  History of Present Illness Alison Ford is a 69 year old female with COPD who presents with congestion and cataracts.  She experiences significant congestion, described as clear and dry, with associated nasal dryness. She feels 'senile' and congested. No allergies except for penicillin.  She has a history of smoking and struggles with quitting, stating 'I stop, I start, I stop, I start.'  She is scheduled for cataract surgery on her left eye on January 20th.  Her past medical history includes COPD, with recent diagnostic studies showing emphysema. Breathing study results indicate a significant reduction in lung function, with a forced expiratory volume (FEV1) of 56% and a forced vital capacity (FVC) of 47%.  Cough, congestion, increased sputum production, wheezing  Compliant with inhalers  She is stopped and started smoking a few times since her last visit   Outpatient Encounter Medications as of 01/24/2024  Medication Sig   acetaZOLAMIDE (DIAMOX) 250 MG tablet Take 500 mg by mouth 2 (two) times daily.   albuterol  (PROVENTIL ) (2.5 MG/3ML) 0.083% nebulizer solution Take 3 mLs (2.5 mg total) by nebulization every 6 (six) hours as needed for wheezing or shortness of breath.   albuterol  (VENTOLIN  HFA) 108 (90 Base) MCG/ACT inhaler Inhale 2 puffs into the lungs every 6 (six) hours as needed for wheezing or shortness of breath.   amLODipine  (NORVASC ) 5 MG tablet TAKE 1 TABLET EVERY DAY (DOSE DECREASE)   aspirin  EC 81 MG tablet Take 1 tablet (81 mg total) by mouth daily.    azaTHIOprine (IMURAN) 50 MG tablet Take 100 mg by mouth daily.   azithromycin  (ZITHROMAX  Z-PAK) 250 MG tablet Take 2 tablets day 1 and then 1 daily for 4 days   Blood Glucose Monitoring Suppl (TRUE METRIX METER) w/Device KIT Use as directed   brimonidine (ALPHAGAN) 0.2 % ophthalmic solution Place into both eyes.   clotrimazole -betamethasone  (LOTRISONE ) cream APPLY CREAM TOPICALLY TO AFFECTED AREA TWICE DAILY   cromolyn (OPTICROM) 4 % ophthalmic solution Place 1 drop into both eyes 4 (four) times daily.   dorzolamide-timolol (COSOPT) 2-0.5 % ophthalmic solution Apply 1 drop to eye.   Ergocalciferol  50 MCG (2000 UT) CAPS Take 1 capsule by mouth daily.   FEROSUL 325 (65 Fe) MG tablet TAKE 1 TABLET THREE TIMES WEEKLY   ketorolac (ACULAR) 0.5 % ophthalmic solution Place 1 drop into the left eye 4 (four) times daily.   latanoprost (XALATAN) 0.005 % ophthalmic solution Place 1 drop into both eyes.   lisinopril -hydrochlorothiazide  (ZESTORETIC ) 20-25 MG tablet Take 1 tablet by mouth daily.   lovastatin  (MEVACOR ) 40 MG tablet TAKE 1 TABLET AT BEDTIME   nicotine  polacrilex (NICORETTE ) 2 MG gum Take 1 each (2 mg total) by mouth as needed for smoking cessation.   ofloxacin (OCUFLOX) 0.3 % ophthalmic solution Place 1 drop into the left eye 4 (four) times daily.   pantoprazole  (PROTONIX ) 40 MG tablet TAKE 1 TABLET EVERY DAY   prednisoLONE acetate (PRED FORTE) 1 % ophthalmic suspension Place 1 drop into the  left eye 4 (four) times daily.   predniSONE  (DELTASONE ) 20 MG tablet Take 1 tablet (20 mg total) by mouth daily with breakfast.   TRELEGY ELLIPTA  200-62.5-25 MCG/ACT AEPB INHALE 1 PUFF INTO THE LUNGS DAILY   TRUEplus Lancets 28G MISC Use as directed   TRULANCE 3 MG TABS Take 1 tablet by mouth daily.   [DISCONTINUED] benzonatate  (TESSALON  PERLES) 100 MG capsule Take 1 capsule (100 mg total) by mouth 3 (three) times daily as needed.   [DISCONTINUED] cetirizine  (ZYRTEC ) 10 MG tablet Take 1 tablet (10 mg total)  by mouth daily. (Patient not taking: Reported on 01/24/2024)   [DISCONTINUED] glucose blood (TRUE METRIX BLOOD GLUCOSE TEST) test strip Use to check blood sugar once daily. (Patient not taking: Reported on 01/03/2024)   [DISCONTINUED] predniSONE  (DELTASONE ) 20 MG tablet Take 1 tablet (20 mg total) by mouth daily with breakfast.   No facility-administered encounter medications on file as of 01/24/2024.    Allergies as of 01/24/2024 - Review Complete 01/24/2024  Allergen Reaction Noted   Penicillins Itching and Other (See Comments) 07/28/2011    Past Medical History:  Diagnosis Date   Abnormal facial hair    Back pain    d/t knee pain   Headache(784.0)    Heart murmur    slight   HTN (hypertension)    takes Amlodipine  daily and HCTZ    Hyperlipidemia    takes Lovastatin  nightly   Joint pain    Right knee DJD    Weakness    left hand    Past Surgical History:  Procedure Laterality Date   ABDOMINAL HYSTERECTOMY     Athroscopic knee surgery     Bilateral   BREAST BIOPSY Right 03/2017   CARPAL TUNNEL RELEASE Right 05/02/2016   Procedure: CARPAL TUNNEL RELEASE;  Surgeon: Glendia Cordella Hutchinson, MD;  Location: [facility] OR;  Service: Orthopedics;  Laterality: Right;   CHOLECYSTECTOMY     DILATION AND CURETTAGE OF UTERUS     EYE SURGERY Right    SHOULDER ARTHROSCOPY WITH SUBACROMIAL DECOMPRESSION Right 05/02/2016   Procedure: RIGHT SHOULDER ARTHROSCOPY WITH SUBACROMIAL DECOMPRESSION, MINI OPEN ROTATOR CUFF TEAR REPAIR, AND RIGHT CARPAL TUNNEL RELEASE;  Surgeon: Glendia Cordella Hutchinson, MD;  Location: [facility] OR;  Service: Orthopedics;  Laterality: Right;   TOTAL KNEE ARTHROPLASTY Right 07/15/2012   Procedure: RIGHT TOTAL KNEE ARTHROPLASTY;  Surgeon: Lamar DELENA Millman, MD;  Location: [facility] OR;  Service: Orthopedics;  Laterality: Right;    Family History  Problem Relation Age of Onset   CAD Father 39   CAD Mother 62   Hypertension Mother     Social History   Socioeconomic History   Marital status:  Legally Separated    Spouse name: Not on file   Number of children: 3   Years of education: Not on file   Highest education level: Not on file  Occupational History    Employer: [id number]  Tobacco Use   Smoking status: Every Day    Current packs/day: 0.00    Average packs/day: 0.3 packs/day    Types: Cigarettes    Last attempt to quit: 08/13/2020    Years since quitting: 3.4   Smokeless tobacco: Never   Tobacco comments:    5 cigarettes a day 01/24/2024 KRD  Substance and Sexual Activity   Alcohol use: Yes    Alcohol/week: 0.0 standard drinks of alcohol    Comment: ocassionally   Drug use: No   Sexual activity: Never  Other Topics Concern   Not  on file  Social History Narrative   Lives by herself now.    Social Drivers of Health   Tobacco Use: High Risk (01/24/2024)   Patient History    Smoking Tobacco Use: Every Day    Smokeless Tobacco Use: Never    Passive Exposure: Not on file  Financial Resource Strain: Low Risk (10/06/2022)   Overall Financial Resource Strain (CARDIA)    Difficulty of Paying Living Expenses: Not hard at all  Food Insecurity: No Food Insecurity (12/28/2022)   Hunger Vital Sign    Worried About Running Out of Food in the Last Year: Never true    Ran Out of Food in the Last Year: Never true  Transportation Needs: No Transportation Needs (12/28/2022)   PRAPARE - Administrator, Civil Service (Medical): No    Lack of Transportation (Non-Medical): No  Physical Activity: Sufficiently Active (12/28/2022)   Exercise Vital Sign    Days of Exercise per Week: 6 days    Minutes of Exercise per Session: 50 min  Recent Concern: Physical Activity - Insufficiently Active (10/06/2022)   Exercise Vital Sign    Days of Exercise per Week: 7 days    Minutes of Exercise per Session: 20 min  Stress: No Stress Concern Present (12/28/2022)   [employer] of Occupational Health - Occupational Stress Questionnaire    Feeling of Stress : Not at all  Social  Connections: Socially Isolated (12/28/2022)   Social Connection and Isolation Panel    Frequency of Communication with Friends and Family: Three times a week    Frequency of Social Gatherings with Friends and Family: Twice a week    Attends Religious Services: Never    Database Administrator or Organizations: No    Attends Banker Meetings: Never    Marital Status: Separated  Intimate Partner Violence: Not At Risk (12/28/2022)   Humiliation, Afraid, Rape, and Kick questionnaire    Fear of Current or Ex-Partner: No    Emotionally Abused: No    Physically Abused: No    Sexually Abused: No  Depression (PHQ2-9): Medium Risk (08/30/2023)   Depression (PHQ2-9)    PHQ-2 Score: 5  Alcohol Screen: Low Risk (12/28/2022)   Alcohol Screen    Last Alcohol Screening Score (AUDIT): 1  Housing: Low Risk (12/28/2022)   Housing Stability Vital Sign    Unable to Pay for Housing in the Last Year: No    Number of Times Moved in the Last Year: 0    Homeless in the Last Year: No  Utilities: Not At Risk (12/28/2022)   [employer] Utilities    Threatened with loss of utilities: No  Health Literacy: Adequate Health Literacy (12/28/2022)   B1300 Health Literacy    Frequency of need for help with medical instructions: Never    Review of Systems  Constitutional:  Negative for fever.  Respiratory:  Positive for cough and wheezing.     Vitals:   01/24/24 1347  BP: 115/79  Pulse: (!) 110  Temp: 98.6 F (37 C)  SpO2: 96%     Physical Exam Constitutional:      Appearance: Normal appearance.  HENT:     Head: Normocephalic.     Mouth/Throat:     Mouth: Mucous membranes are moist.  Eyes:     General: No scleral icterus. Cardiovascular:     Rate and Rhythm: Normal rate and regular rhythm.     Heart sounds: No murmur heard.    No friction rub.  Pulmonary:     Effort: No respiratory distress.     Breath sounds: No stridor. No wheezing or rhonchi.  Musculoskeletal:     Cervical back: No  rigidity or tenderness.  Neurological:     Mental Status: She is alert.  Psychiatric:        Mood and Affect: Mood normal.    Data Reviewed: Most recent CT chest was reviewed showing evidence of emphysema, no nodules  Last pulmonary function test 08/07/2022 consistent with stage III COPD, moderate reduction in diffusing capacity    Assessment and Plan Assessment & Plan Chronic obstructive pulmonary disease, GOLD stage 3 GOLD stage 3 COPD with reduced lung function. Spirometry shows FVC at 56% and FEV1 at 47%, indicating moderate to severe obstruction. Oxygen  diffusion capacity is compromised, with a risk of requiring supplemental oxygen  if further lung function decline occurs. Continued smoking exacerbates the condition. - Encouraged smoking cessation to prevent further lung damage. - Discussed potential need for supplemental oxygen  if oxygen  diffusion capacity worsens.  Acute upper respiratory infection Symptoms of congestion and clear cough. Prescribed azithromycin  for potential bacterial infection. - Prescribed azithromycin  for potential bacterial infection. - Prescribed a short course of steroids to reduce inflammation.  Nicotine  dependence Ongoing nicotine  dependence with difficulty in cessation. Acknowledges the need to quit smoking to improve health outcomes. - Encouraged continued efforts to quit smoking. - Discussed the cumulative harm of smoking and the importance of cessation.   Follow-up in about 6 months  Encouraged to give us  a call with any significant concerns  The importance of making sure that she quit smoking was discussed  She will be having cataract surgery later on in the month  Continue Trelegy  Encouraged to give us  a call with any significant concerns  I personally spent a total of 31 minutes in the care of the patient today including preparing to see the patient, getting/reviewing separately obtained history, performing a medically appropriate  exam/evaluation, counseling and educating, placing orders, documenting clinical information in the EHR, and independently interpreting results.      Jennet Epley MD [facility] 01/24/2024, 1:58 PM  CC: Vicci Barnie NOVAK, MD   "

## 2024-01-24 NOTE — Patient Instructions (Signed)
 I did call in a course of antibiotic and steroid for you for your bronchitis  Continue to work on quitting smoking  Your breathing study does show COPD stage III, you cannot afford to lose more lung function and continue to smoke as a direct bearing on this  Follow-up in 6 months  Call us  with significant concerns

## 2024-02-18 ENCOUNTER — Other Ambulatory Visit: Payer: Self-pay | Admitting: Internal Medicine

## 2024-02-18 DIAGNOSIS — I10 Essential (primary) hypertension: Secondary | ICD-10-CM

## 2024-03-11 ENCOUNTER — Ambulatory Visit: Payer: Self-pay

## 2024-05-05 ENCOUNTER — Ambulatory Visit: Payer: Self-pay | Admitting: Internal Medicine

## 2024-05-26 ENCOUNTER — Ambulatory Visit: Admitting: Pulmonary Disease
